# Patient Record
Sex: Female | Born: 1970 | Race: White | Hispanic: No | Smoking: Never smoker
Health system: Southern US, Community
[De-identification: ages and names within clinical notes are randomized; demographics above are authoritative.]

## PROBLEM LIST (undated history)

## (undated) DIAGNOSIS — R7303 Prediabetes: Secondary | ICD-10-CM

## (undated) DIAGNOSIS — I10 Essential (primary) hypertension: Secondary | ICD-10-CM

## (undated) DIAGNOSIS — E119 Type 2 diabetes mellitus without complications: Secondary | ICD-10-CM

## (undated) DIAGNOSIS — E039 Hypothyroidism, unspecified: Secondary | ICD-10-CM

## (undated) DIAGNOSIS — T783XXA Angioneurotic edema, initial encounter: Secondary | ICD-10-CM

## (undated) DIAGNOSIS — F329 Major depressive disorder, single episode, unspecified: Secondary | ICD-10-CM

## (undated) DIAGNOSIS — J45909 Unspecified asthma, uncomplicated: Secondary | ICD-10-CM

## (undated) DIAGNOSIS — M21371 Foot drop, right foot: Secondary | ICD-10-CM

## (undated) DIAGNOSIS — C55 Malignant neoplasm of uterus, part unspecified: Secondary | ICD-10-CM

## (undated) DIAGNOSIS — G4730 Sleep apnea, unspecified: Secondary | ICD-10-CM

## (undated) DIAGNOSIS — D649 Anemia, unspecified: Secondary | ICD-10-CM

## (undated) DIAGNOSIS — E079 Disorder of thyroid, unspecified: Secondary | ICD-10-CM

## (undated) DIAGNOSIS — L509 Urticaria, unspecified: Secondary | ICD-10-CM

## (undated) DIAGNOSIS — F32A Depression, unspecified: Secondary | ICD-10-CM

## (undated) DIAGNOSIS — F419 Anxiety disorder, unspecified: Secondary | ICD-10-CM

## (undated) DIAGNOSIS — M1990 Unspecified osteoarthritis, unspecified site: Secondary | ICD-10-CM

## (undated) HISTORY — PX: WISDOM TOOTH EXTRACTION: SHX21

## (undated) HISTORY — DX: Malignant neoplasm of uterus, part unspecified: C55

## (undated) HISTORY — PX: SINUS EXPLORATION: SHX5214

## (undated) HISTORY — PX: OTHER SURGICAL HISTORY: SHX169

## (undated) HISTORY — PX: SINOSCOPY: SHX187

## (undated) HISTORY — PX: CHOLECYSTECTOMY: SHX55

## (undated) HISTORY — PX: DILATION AND CURETTAGE OF UTERUS: SHX78

## (undated) HISTORY — DX: Urticaria, unspecified: L50.9

## (undated) HISTORY — DX: Unspecified osteoarthritis, unspecified site: M19.90

## (undated) HISTORY — PX: CARPAL TUNNEL RELEASE: SHX101

## (undated) HISTORY — PX: ABDOMINAL HYSTERECTOMY: SHX81

## (undated) HISTORY — DX: Essential (primary) hypertension: I10

## (undated) HISTORY — DX: Foot drop, right foot: M21.371

## (undated) HISTORY — DX: Angioneurotic edema, initial encounter: T78.3XXA

## (undated) MED FILL — Dexamethasone Sodium Phosphate Inj 100 MG/10ML: INTRAMUSCULAR | Qty: 1 | Status: AC

## (undated) MED FILL — Gemcitabine HCl Inj 1 GM/26.3ML (38 MG/ML) (Base Equiv): INTRAVENOUS | Qty: 44 | Status: AC

## (undated) MED FILL — Fosaprepitant Dimeglumine For IV Infusion 150 MG (Base Eq): INTRAVENOUS | Qty: 5 | Status: AC

---

## 2012-11-18 ENCOUNTER — Emergency Department (HOSPITAL_BASED_OUTPATIENT_CLINIC_OR_DEPARTMENT_OTHER)
Admission: EM | Admit: 2012-11-18 | Discharge: 2012-11-18 | Disposition: A | Discharge status: Home / Self Care | Payer: Managed Care, Other (non HMO) | Attending: Emergency Medicine | Admitting: Emergency Medicine

## 2012-11-18 ENCOUNTER — Encounter (HOSPITAL_BASED_OUTPATIENT_CLINIC_OR_DEPARTMENT_OTHER): Payer: Self-pay | Admitting: Emergency Medicine

## 2012-11-18 DIAGNOSIS — S0501XA Injury of conjunctiva and corneal abrasion without foreign body, right eye, initial encounter: Secondary | ICD-10-CM

## 2012-11-18 DIAGNOSIS — S058X9A Other injuries of unspecified eye and orbit, initial encounter: Secondary | ICD-10-CM | POA: Insufficient documentation

## 2012-11-18 DIAGNOSIS — E079 Disorder of thyroid, unspecified: Secondary | ICD-10-CM | POA: Insufficient documentation

## 2012-11-18 DIAGNOSIS — F329 Major depressive disorder, single episode, unspecified: Secondary | ICD-10-CM | POA: Insufficient documentation

## 2012-11-18 DIAGNOSIS — X58XXXA Exposure to other specified factors, initial encounter: Secondary | ICD-10-CM | POA: Insufficient documentation

## 2012-11-18 DIAGNOSIS — Y939 Activity, unspecified: Secondary | ICD-10-CM | POA: Insufficient documentation

## 2012-11-18 DIAGNOSIS — Z792 Long term (current) use of antibiotics: Secondary | ICD-10-CM | POA: Insufficient documentation

## 2012-11-18 DIAGNOSIS — Y929 Unspecified place or not applicable: Secondary | ICD-10-CM | POA: Insufficient documentation

## 2012-11-18 DIAGNOSIS — Z79899 Other long term (current) drug therapy: Secondary | ICD-10-CM | POA: Insufficient documentation

## 2012-11-18 DIAGNOSIS — F3289 Other specified depressive episodes: Secondary | ICD-10-CM | POA: Insufficient documentation

## 2012-11-18 HISTORY — DX: Disorder of thyroid, unspecified: E07.9

## 2012-11-18 HISTORY — DX: Depression, unspecified: F32.A

## 2012-11-18 HISTORY — DX: Major depressive disorder, single episode, unspecified: F32.9

## 2012-11-18 MED ORDER — TETRACAINE HCL 0.5 % OP SOLN
1.0000 [drp] | Freq: Once | OPHTHALMIC | Status: AC
  Administered 2012-11-18: 1 [drp] via OPHTHALMIC

## 2012-11-18 MED ORDER — FLUORESCEIN SODIUM 1 MG OP STRP
1.0000 | ORAL_STRIP | Freq: Once | OPHTHALMIC | Status: AC
  Administered 2012-11-18: 1 via OPHTHALMIC

## 2012-11-18 MED ORDER — FLUORESCEIN SODIUM 1 MG OP STRP
ORAL_STRIP | OPHTHALMIC | Status: AC
  Administered 2012-11-18: 1 via OPHTHALMIC
  Filled 2012-11-18: qty 1

## 2012-11-18 MED ORDER — TETRACAINE HCL 0.5 % OP SOLN
OPHTHALMIC | Status: AC
  Administered 2012-11-18: 1 [drp] via OPHTHALMIC
  Filled 2012-11-18: qty 2

## 2012-11-18 MED ORDER — ERYTHROMYCIN 5 MG/GM OP OINT
TOPICAL_OINTMENT | Freq: Four times a day (QID) | OPHTHALMIC | Status: DC
  Administered 2012-11-18: 1 via OPHTHALMIC
  Filled 2012-11-18: qty 3.5

## 2012-11-18 MED ORDER — HYDROCODONE-ACETAMINOPHEN 5-325 MG PO TABS
1.0000 | ORAL_TABLET | Freq: Once | ORAL | Status: AC
  Administered 2012-11-18: 1 via ORAL
  Filled 2012-11-18: qty 1

## 2012-11-18 MED ORDER — HYDROCODONE-ACETAMINOPHEN 5-325 MG PO TABS: 1.0000 | ORAL_TABLET | Freq: Four times a day (QID) | ORAL | Status: DC | PRN

## 2012-11-18 NOTE — ED Notes (Signed)
Pt states she developed shooting pain to right eye around 2200 last pm, went to bed, throughtout night pain got worse,

## 2012-11-18 NOTE — ED Provider Notes (Signed)
CSN: [id number]     Arrival date & time 11/18/12  7829 History   First MD Initiated Contact with Patient 11/18/12 [phone]     Chief Complaint  Patient presents with  . Eye Pain   (Consider location/radiation/quality/duration/timing/severity/associated sxs/prior Treatment) HPI This is a 42 year old female who wears extended wear contact lenses. She developed some pain in her right eye yesterday evening about 10 PM. She is not aware of any trauma to eye. Throughout the night the pain got worse she took her right contact lens out about an hour and a half ago. She was not aware of any blurred vision prior to removing her contact. She rates her pain about a 7/10. There is associated photophobia and watering of the right eye.  Past Medical History  Diagnosis Date  . Thyroid disease   . Depression    History reviewed. No pertinent past surgical history. History reviewed. No pertinent family history. History  Substance Use Topics  . Smoking status: Never Smoker   . Smokeless tobacco: Not on file  . Alcohol Use: No   OB History   Grav Para Term Preterm Abortions TAB SAB Ect Mult Living                 Review of Systems  All other systems reviewed and are negative.    Allergies  Ciprofloxacin  Home Medications   Current Outpatient Rx  Name  Route  Sig  Dispense  Refill  . citalopram (CELEXA) 20 MG tablet   Oral   Take 20 mg by mouth daily.         Marland Kitchen levothyroxine (SYNTHROID, LEVOTHROID) 100 MCG tablet   Oral   Take 100 mcg by mouth daily before breakfast.          BP 150/73  Pulse 68  Temp(Src) 97.7 F (36.5 C) (Oral)  Resp 20  SpO2 98%  LMP 10/31/2012  Physical Exam General: Well-developed, morbidly obese female in no acute distress; appearance consistent with age of record HENT: normocephalic; atraumatic Eyes: pupils equal, round and reactive to light; extraocular muscles intact; small abrasion seen near edge of right cornea with fluorescein uptake (pain  completely relieved with tetracaine drops) Neck: supple Heart: regular rate and rhythm Lungs: Normal respiratory effort and excursion Abdomen: soft; nondistended Extremities: No deformity; full range of motion Neurologic: Awake, alert and oriented; motor function intact in all extremities and symmetric; no facial droop Skin: Warm and dry Psychiatric: Normal mood and affect    ED Course  Procedures (including critical care time)  MDM  Patient was advised not to wear her right contact for at least 3 days or 24 hours after resolution of her symptoms. If her symptoms persist longer than 48 hours she should follow up with her eye doctor.    Hanley Seamen, MD 11/18/12 [phone]

## 2013-03-23 ENCOUNTER — Emergency Department (HOSPITAL_BASED_OUTPATIENT_CLINIC_OR_DEPARTMENT_OTHER)
Admission: EM | Admit: 2013-03-23 | Discharge: 2013-03-23 | Disposition: A | Discharge status: Home / Self Care | Payer: Managed Care, Other (non HMO) | Attending: Emergency Medicine | Admitting: Emergency Medicine

## 2013-03-23 ENCOUNTER — Encounter (HOSPITAL_BASED_OUTPATIENT_CLINIC_OR_DEPARTMENT_OTHER): Payer: Self-pay | Admitting: Emergency Medicine

## 2013-03-23 ENCOUNTER — Emergency Department (HOSPITAL_BASED_OUTPATIENT_CLINIC_OR_DEPARTMENT_OTHER): Payer: Managed Care, Other (non HMO)

## 2013-03-23 DIAGNOSIS — E079 Disorder of thyroid, unspecified: Secondary | ICD-10-CM | POA: Insufficient documentation

## 2013-03-23 DIAGNOSIS — J45901 Unspecified asthma with (acute) exacerbation: Secondary | ICD-10-CM | POA: Insufficient documentation

## 2013-03-23 DIAGNOSIS — J40 Bronchitis, not specified as acute or chronic: Secondary | ICD-10-CM

## 2013-03-23 DIAGNOSIS — R509 Fever, unspecified: Secondary | ICD-10-CM | POA: Insufficient documentation

## 2013-03-23 DIAGNOSIS — F329 Major depressive disorder, single episode, unspecified: Secondary | ICD-10-CM | POA: Insufficient documentation

## 2013-03-23 DIAGNOSIS — Z79899 Other long term (current) drug therapy: Secondary | ICD-10-CM | POA: Insufficient documentation

## 2013-03-23 DIAGNOSIS — F3289 Other specified depressive episodes: Secondary | ICD-10-CM | POA: Insufficient documentation

## 2013-03-23 HISTORY — DX: Unspecified asthma, uncomplicated: J45.909

## 2013-03-23 MED ORDER — HYDROCODONE-HOMATROPINE 5-1.5 MG/5ML PO SYRP: 5.0000 mL | ORAL_SOLUTION | Freq: Four times a day (QID) | ORAL | Status: DC | PRN

## 2013-03-23 MED ORDER — ALBUTEROL SULFATE (2.5 MG/3ML) 0.083% IN NEBU
2.5000 mg | INHALATION_SOLUTION | Freq: Once | RESPIRATORY_TRACT | Status: AC
  Administered 2013-03-23: 2.5 mg via RESPIRATORY_TRACT

## 2013-03-23 MED ORDER — IPRATROPIUM-ALBUTEROL 0.5-2.5 (3) MG/3ML IN SOLN
RESPIRATORY_TRACT | Status: AC
  Filled 2013-03-23: qty 3

## 2013-03-23 MED ORDER — PREDNISONE 20 MG PO TABS: 40.0000 mg | ORAL_TABLET | Freq: Every day | ORAL | Status: DC

## 2013-03-23 MED ORDER — PREDNISONE 10 MG PO TABS
60.0000 mg | ORAL_TABLET | Freq: Once | ORAL | Status: AC
  Administered 2013-03-23: 60 mg via ORAL
  Filled 2013-03-23 (×2): qty 1

## 2013-03-23 MED ORDER — IPRATROPIUM-ALBUTEROL 0.5-2.5 (3) MG/3ML IN SOLN
3.0000 mL | Freq: Once | RESPIRATORY_TRACT | Status: AC
  Administered 2013-03-23: 3 mL via RESPIRATORY_TRACT
  Filled 2013-03-23: qty 3

## 2013-03-23 MED ORDER — IPRATROPIUM BROMIDE 0.02 % IN SOLN
0.5000 mg | Freq: Once | RESPIRATORY_TRACT | Status: AC
  Administered 2013-03-23: 0.5 mg via RESPIRATORY_TRACT
  Filled 2013-03-23: qty 2.5

## 2013-03-23 MED ORDER — ALBUTEROL SULFATE (2.5 MG/3ML) 0.083% IN NEBU
INHALATION_SOLUTION | RESPIRATORY_TRACT | Status: AC
  Filled 2013-03-23: qty 3

## 2013-03-23 MED ORDER — ALBUTEROL SULFATE (2.5 MG/3ML) 0.083% IN NEBU
5.0000 mg | INHALATION_SOLUTION | Freq: Once | RESPIRATORY_TRACT | Status: AC
  Administered 2013-03-23: 5 mg via RESPIRATORY_TRACT
  Filled 2013-03-23: qty 6

## 2013-03-23 NOTE — ED Notes (Signed)
Pt c/o SOb x 1 week with cough

## 2013-03-23 NOTE — Discharge Instructions (Signed)
Bronchitis °Bronchitis is swelling (inflammation) of the air tubes leading to your lungs (bronchi). This causes mucus and a cough. If the swelling gets bad, you may have trouble breathing. °HOME CARE  °· Rest. °· Drink enough fluids to keep your pee (urine) clear or pale yellow (unless you have a condition where you have to watch how much you drink). °· Only take medicine as told by your doctor. If you were given antibiotic medicines, finish them even if you start to feel better. °· Avoid smoke, irritating chemicals, and strong smells. These make the problem worse. Quit smoking if you smoke. This helps your lungs heal faster. °· Use a cool mist humidifier. Change the water in the humidifier every day. You can also sit in the bathroom with hot shower running for 5 10 minutes. Keep the door closed. °· See your health care provider as told. °· Wash your hands often. °GET HELP IF: °Your problems do not get better after 1 week. °GET HELP RIGHT AWAY IF:  °· Your fever gets worse. °· You have chills. °· Your chest hurts. °· Your problems breathing get worse. °· You have blood in your mucus. °· You pass out (faint). °· You feel lightheaded. °· You have a bad headache. °· You throw up (vomit) again and again. °MAKE SURE YOU: °· Understand these instructions. °· Will watch your condition. °· Will get help right away if you are not doing well or get worse. °Document Released: 07/17/2007 Document Revised: 11/18/2012 Document Reviewed: 09/22/2012 °ExitCare® Patient Information ©2014 [employer]. ° °

## 2013-03-23 NOTE — ED Notes (Signed)
C/o cough, congestion sob x 1 week  Denies pain

## 2013-03-23 NOTE — ED Provider Notes (Addendum)
CSN: [id number]     Arrival date & time 03/23/13  1919 History  This chart was scribed for Blanchie Dessert, MD by Celesta Gentile, ED Scribe. The patient was seen in room MH08/MH08. Patient's care was started at 7:49 PM.  Chief Complaint  Patient presents with  . Shortness of Breath   Patient is a 43 y.o. female presenting with shortness of breath. The history is provided by the patient. No language interpreter was used.  Shortness of Breath Severity:  Moderate Onset quality:  Gradual Duration:  7 days Timing:  Intermittent Progression:  Worsening Chronicity:  New Context: activity   Context: not smoke exposure   Context comment:  Bronchitis Relieved by:  Nothing Worsened by:  Coughing and movement Ineffective treatments:  Inhaler Associated symptoms: fever and wheezing   Associated symptoms: no abdominal pain, no chest pain, no cough, no headaches, no rash and no vomiting   Risk factors: obesity    HPI Comments: Janet [name] is a 43 y.o. female who presents to the Emergency Department complaining of moderate intermittent SOB with an associated dry cough that started about 7 days ago.  She reports her chest feels tight, especially with movement.  Pt states that she has had intermittent fever and chills.  Pt states that she was diagnosed with bronchitis in January and prescribed azithromycin and prednisone.  Pt reports that she finished her medications resolution of sx for a few weeks and then this started again.  Pt states that she has a rescue inhaler.  She reports that she never had to use it, but now she has to use it multiple times throughout the day.  Pt denies smoking or being around smoke.    Past Medical History  Diagnosis Date  . Thyroid disease   . Depression   . Asthma    Past Surgical History  Procedure Laterality Date  . Carpal tunnel release    . Cesarean section    . Sinus exploration    . Dilation and curettage of uterus     History reviewed. No pertinent  family history. History  Substance Use Topics  . Smoking status: Never Smoker   . Smokeless tobacco: Not on file  . Alcohol Use: No   OB History   Grav Para Term Preterm Abortions TAB SAB Ect Mult Living                 Review of Systems  Constitutional: Positive for fever and chills.  HENT: Negative for congestion and rhinorrhea.   Respiratory: Positive for shortness of breath and wheezing. Negative for cough.   Cardiovascular: Negative for chest pain.  Gastrointestinal: Negative for nausea, vomiting, abdominal pain and diarrhea.  Musculoskeletal: Negative for back pain.  Skin: Negative for color change and rash.  Neurological: Negative for syncope and headaches.  Psychiatric/Behavioral: Negative for behavioral problems and confusion.  All other systems reviewed and are negative.   Allergies  Ciprofloxacin  Home Medications   Current Outpatient Rx  Name  Route  Sig  Dispense  Refill  . citalopram (CELEXA) 20 MG tablet   Oral   Take 20 mg by mouth daily.         Marland Kitchen HYDROcodone-acetaminophen (NORCO/VICODIN) 5-325 MG per tablet   Oral   Take 1-2 tablets by mouth every 6 (six) hours as needed for pain.   20 tablet   0   . levothyroxine (SYNTHROID, LEVOTHROID) 100 MCG tablet   Oral   Take 100 mcg by mouth daily before  breakfast.          Triage Vitals: BP 165/69  Pulse 92  Temp(Src) 98.9 F (37.2 C) (Oral)  Ht 5\' 9"  (1.753 m)  Wt 385 lb (174.635 kg)  BMI 56.83 kg/m2  SpO2 100%  Physical Exam  Nursing note and vitals reviewed. Constitutional: She is oriented to person, place, and time. She appears well-developed and well-nourished. No distress.  HENT:  Head: Normocephalic and atraumatic.  Right Ear: External ear normal.  Left Ear: External ear normal.  Mouth/Throat: Oropharynx is clear and moist. No oropharyngeal exudate.  Swollen nasal turbinates.    Eyes: Conjunctivae and EOM are normal. Right eye exhibits no discharge. Left eye exhibits no discharge.   Neck: Neck supple. No tracheal deviation present.  Cardiovascular: Normal rate, regular rhythm and normal heart sounds.  Exam reveals no gallop and no friction rub.   No murmur heard. Pulmonary/Chest: Effort normal. No respiratory distress. She has wheezes (diffuse).  Abdominal:  Morbidly obese.    Musculoskeletal: Normal range of motion.  Neurological: She is alert and oriented to person, place, and time.  Skin: Skin is warm and dry. No rash noted.  Psychiatric: She has a normal mood and affect. Her behavior is normal. Judgment and thought content normal.    ED Course  Procedures (including critical care time) DIAGNOSTIC STUDIES: Oxygen Saturation is 100% on RA, normal by my interpretation.    COORDINATION OF CARE: 7:50PM-Will order breathing treatments. Patient informed of current plan of treatment and evaluation and agrees with plan.    Labs Review Labs Reviewed - No data to display Imaging Review Dg Chest 2 View  03/23/2013   CLINICAL DATA:  Cough, congestion and shortness of breath  EXAM: CHEST  2 VIEW  COMPARISON:  Prior radiograph from 03/17/2007  FINDINGS: The cardiac and mediastinal silhouettes are stable in size and contour, and remain within normal limits.  The lungs are normally inflated. Diffuse bronchitic changes are present. No airspace consolidation, pleural effusion, or pulmonary edema is identified. There is no pneumothorax.  No acute osseous abnormality identified.  IMPRESSION: Diffuse bronchitic changes. No focal infiltrate identified to suggest pneumonia.   Electronically Signed   By: Jeannine Boga M.D.   On: 03/23/2013 20:48    EKG Interpretation   None       MDM   Final diagnoses:  Bronchitis   Pt with symptoms consistent with viral bronchitis with diffuse wheezing but sating normally and no resp distress.  Well appearing here.  Low suspicion for flu. No signs of pharyngitis, otitis or abnormal abdominal findings.   CXR pending.  Pt given  albuterol/atrovent and prednisone.  8:51 PM CXR showed diffuse bronchitis changes but o/w no focal PNA.  After neb and steroids wheezing improved but continues to wheeze and given second treatment.  9:12 PM Wheezing greatly improved.  Will send pt home with steroids and cough meds.  Already has albuterol at home.  I personally performed the services described in this documentation, which was scribed in my presence.  The recorded information has been reviewed and considered.    Blanchie Dessert, MD 03/23/13 2113  Blanchie Dessert, MD 03/23/13 2114

## 2013-04-13 ENCOUNTER — Ambulatory Visit: Payer: Managed Care, Other (non HMO)

## 2013-06-23 ENCOUNTER — Encounter (HOSPITAL_BASED_OUTPATIENT_CLINIC_OR_DEPARTMENT_OTHER): Payer: Self-pay | Admitting: Emergency Medicine

## 2013-06-23 ENCOUNTER — Emergency Department (HOSPITAL_BASED_OUTPATIENT_CLINIC_OR_DEPARTMENT_OTHER)
Admission: EM | Admit: 2013-06-23 | Discharge: 2013-06-23 | Disposition: A | Discharge status: Home / Self Care | Payer: Managed Care, Other (non HMO) | Attending: Emergency Medicine | Admitting: Emergency Medicine

## 2013-06-23 DIAGNOSIS — J45901 Unspecified asthma with (acute) exacerbation: Secondary | ICD-10-CM | POA: Insufficient documentation

## 2013-06-23 DIAGNOSIS — E079 Disorder of thyroid, unspecified: Secondary | ICD-10-CM | POA: Insufficient documentation

## 2013-06-23 DIAGNOSIS — Z79899 Other long term (current) drug therapy: Secondary | ICD-10-CM | POA: Insufficient documentation

## 2013-06-23 DIAGNOSIS — M62838 Other muscle spasm: Secondary | ICD-10-CM | POA: Insufficient documentation

## 2013-06-23 DIAGNOSIS — F329 Major depressive disorder, single episode, unspecified: Secondary | ICD-10-CM | POA: Insufficient documentation

## 2013-06-23 DIAGNOSIS — F3289 Other specified depressive episodes: Secondary | ICD-10-CM | POA: Insufficient documentation

## 2013-06-23 MED ORDER — ALBUTEROL SULFATE (2.5 MG/3ML) 0.083% IN NEBU
5.0000 mg | INHALATION_SOLUTION | Freq: Once | RESPIRATORY_TRACT | Status: AC
  Administered 2013-06-23: 5 mg via RESPIRATORY_TRACT
  Filled 2013-06-23: qty 6

## 2013-06-23 MED ORDER — IBUPROFEN 800 MG PO TABS: 800.0000 mg | ORAL_TABLET | Freq: Three times a day (TID) | ORAL | Status: DC | PRN

## 2013-06-23 MED ORDER — CYCLOBENZAPRINE HCL 10 MG PO TABS: 10.0000 mg | ORAL_TABLET | Freq: Three times a day (TID) | ORAL | Status: DC | PRN

## 2013-06-23 NOTE — ED Provider Notes (Signed)
CSN: [id number]     Arrival date & time 06/23/13  1953 History  This chart was scribed for Hason Ofarrell B. Karle Starch, MD by Zettie Pho, ED Scribe. This patient was seen in room MH05/MH05 and the patient's care was started at 9:08 PM.    Chief Complaint  Patient presents with  . Shoulder Pain   The history is provided by the patient. No language interpreter was used.   HPI Comments: Janet [name] is a 43 y.o. female who presents to the Emergency Department complaining of a waxing and waning, gradual onset pain to the left shoulder that radiates to the left side of the neck onset about 1.5 weeks ago after carrying heavy objects over the shoulder. She states that the pain is exacerbated with certain movements. Patient reports taking ibuprofen and applying Icy Hot to the area at home without significant relief. Patient has a history of asthma and thyroid disease.   Patient also presents to the ED with wheezing, which she states has been gradually improving. Patient reports that she has a rescue inhaler at home, but has not used it because it has almost run out.   Past Medical History  Diagnosis Date  . Thyroid disease   . Depression   . Asthma    Past Surgical History  Procedure Laterality Date  . Carpal tunnel release    . Cesarean section    . Sinus exploration    . Dilation and curettage of uterus     History reviewed. No pertinent family history. History  Substance Use Topics  . Smoking status: Never Smoker   . Smokeless tobacco: Not on file  . Alcohol Use: No   OB History   Grav Para Term Preterm Abortions TAB SAB Ect Mult Living                 Review of Systems  A complete 10 system review of systems was obtained and all systems are negative except as noted in the HPI and PMH.    Allergies  Ciprofloxacin  Home Medications   Prior to Admission medications   Medication Sig Start Date End Date Taking? Authorizing Provider  citalopram (CELEXA) 20 MG tablet Take 20 mg by  mouth daily.    Historical Provider, MD  levothyroxine (SYNTHROID, LEVOTHROID) 100 MCG tablet Take 100 mcg by mouth daily before breakfast.    Historical Provider, MD   Triage Vitals: BP 165/83  Pulse 73  Temp(Src) 98.4 F (36.9 C) (Oral)  Resp 14  Ht 5\' 9"  (1.753 m)  Wt 370 lb (167.831 kg)  BMI 54.61 kg/m2  SpO2 94%  Physical Exam  Nursing note and vitals reviewed. Constitutional: She is oriented to person, place, and time. She appears well-developed and well-nourished.  HENT:  Head: Normocephalic and atraumatic.  Eyes: EOM are normal. Pupils are equal, round, and reactive to light.  Neck: Normal range of motion. Neck supple.  Tenderness in the muscles of the left neck and shoulder.  Cardiovascular: Normal rate, normal heart sounds and intact distal pulses.   Pulmonary/Chest: Effort normal. She has wheezes.  Faint end expiratory wheezes.   Abdominal: Bowel sounds are normal. She exhibits no distension. There is no tenderness.  Musculoskeletal: Normal range of motion. She exhibits no edema and no tenderness.  Neurological: She is alert and oriented to person, place, and time. She has normal strength. No cranial nerve deficit or sensory deficit.  Skin: Skin is warm and dry. No rash noted.  Psychiatric: She has a  normal mood and affect.    ED Course  Procedures (including critical care time)  DIAGNOSTIC STUDIES: Oxygen Saturation is 94% on room air, normal by my interpretation.    COORDINATION OF CARE: 9:11 PM- Discussed that symptoms are likely muscular in nature. Will discharge patient with ibuprofen and Flexeril to manage symptoms. Also ordered albuterol nebulizer in the ED to help with her breathing. Discussed treatment plan with patient at bedside and patient verbalized agreement.     Labs Review Labs Reviewed - No data to display  Imaging Review No results found.   EKG Interpretation None      MDM   Final diagnoses:  Muscle spasm of left shoulder area     I personally performed the services described in this documentation, which was scribed in my presence. The recorded information has been reviewed and is accurate.      Catalaya Garr B. Karle Starch, MD 06/24/13 1343

## 2013-06-23 NOTE — ED Notes (Signed)
Left neck and left shoulder pain x 1 weeks. Tender to touch, full shoulder ROM. Carried a case of water 2 weeks ago; believe this is the contributing factor. Pt denies CP but admit to SOB. SOB occurs daily, asthma poorly controlled, use albuterol inhaler daily. No PCP.  RT in room.

## 2013-06-23 NOTE — Progress Notes (Signed)
Patient states she is breathing much better post nebulizer treatment.

## 2013-06-23 NOTE — ED Notes (Signed)
Pt c/o left shoulder and neck pain after carrying something heavy x 1 week ago

## 2013-06-23 NOTE — Discharge Instructions (Signed)
°  Muscle Cramps and Spasms °Muscle cramps and spasms occur when a muscle or muscles tighten and you have no control over this tightening (involuntary muscle contraction). They are a common problem and can develop in any muscle. The most common place is in the calf muscles of the leg. Both muscle cramps and muscle spasms are involuntary muscle contractions, but they also have differences:  °· Muscle cramps are sporadic and painful. They may last a few seconds to a quarter of an hour. Muscle cramps are often more forceful and last longer than muscle spasms. °· Muscle spasms may or may not be painful. They may also last just a few seconds or much longer. °CAUSES  °It is uncommon for cramps or spasms to be due to a serious underlying problem. In many cases, the cause of cramps or spasms is unknown. Some common causes are:  °· Overexertion.   °· Overuse from repetitive motions (doing the same thing over and over).   °· Remaining in a certain position for a long period of time.   °· Improper preparation, form, or technique while performing a sport or activity.   °· Dehydration.   °· Injury.   °· Side effects of some medicines.   °· Abnormally low levels of the salts and ions in your blood (electrolytes), especially potassium and calcium. This could happen if you are taking water pills (diuretics) or you are pregnant.   °Some underlying medical problems can make it more likely to develop cramps or spasms. These include, but are not limited to:  °· Diabetes.   °· Parkinson disease.   °· Hormone disorders, such as thyroid problems.   °· Alcohol abuse.   °· Diseases specific to muscles, joints, and bones.   °· Blood vessel disease where not enough blood is getting to the muscles.   °HOME CARE INSTRUCTIONS  °· Stay well hydrated. Drink enough water and fluids to keep your urine clear or pale yellow. °· It may be helpful to massage, stretch, and relax the affected muscle. °· For tight or tense muscles, use a warm towel, heating  pad, or hot shower water directed to the affected area. °· If you are sore or have pain after a cramp or spasm, applying ice to the affected area may relieve discomfort. °· Put ice in a plastic bag. °· Place a towel between your skin and the bag. °· Leave the ice on for 15-20 minutes, 03-04 times a day. °· Medicines used to treat a known cause of cramps or spasms may help reduce their frequency or severity. Only take over-the-counter or prescription medicines as directed by your caregiver. °SEEK MEDICAL CARE IF:  °Your cramps or spasms get more severe, more frequent, or do not improve over time.  °MAKE SURE YOU:  °· Understand these instructions. °· Will watch your condition. °· Will get help right away if you are not doing well or get worse. °Document Released: 07/20/2001 Document Revised: 05/25/2012 Document Reviewed: 01/15/2012 °Exit[employer]® Patient Information ©2014 [employer]. ° ° °

## 2013-09-05 ENCOUNTER — Encounter (HOSPITAL_BASED_OUTPATIENT_CLINIC_OR_DEPARTMENT_OTHER): Payer: Self-pay | Admitting: Emergency Medicine

## 2013-09-05 ENCOUNTER — Emergency Department (HOSPITAL_BASED_OUTPATIENT_CLINIC_OR_DEPARTMENT_OTHER)
Admission: EM | Admit: 2013-09-05 | Discharge: 2013-09-05 | Disposition: A | Discharge status: Home / Self Care | Payer: Managed Care, Other (non HMO) | Attending: Emergency Medicine | Admitting: Emergency Medicine

## 2013-09-05 DIAGNOSIS — F3289 Other specified depressive episodes: Secondary | ICD-10-CM | POA: Insufficient documentation

## 2013-09-05 DIAGNOSIS — J45901 Unspecified asthma with (acute) exacerbation: Secondary | ICD-10-CM | POA: Insufficient documentation

## 2013-09-05 DIAGNOSIS — F329 Major depressive disorder, single episode, unspecified: Secondary | ICD-10-CM | POA: Insufficient documentation

## 2013-09-05 DIAGNOSIS — E079 Disorder of thyroid, unspecified: Secondary | ICD-10-CM | POA: Insufficient documentation

## 2013-09-05 DIAGNOSIS — IMO0002 Reserved for concepts with insufficient information to code with codable children: Secondary | ICD-10-CM | POA: Insufficient documentation

## 2013-09-05 DIAGNOSIS — R062 Wheezing: Secondary | ICD-10-CM | POA: Insufficient documentation

## 2013-09-05 MED ORDER — PREDNISONE 50 MG PO TABS
60.0000 mg | ORAL_TABLET | Freq: Once | ORAL | Status: AC
  Administered 2013-09-05: 60 mg via ORAL
  Filled 2013-09-05 (×2): qty 1

## 2013-09-05 MED ORDER — PREDNISONE 20 MG PO TABS: 60.0000 mg | ORAL_TABLET | Freq: Every day | ORAL | Status: DC

## 2013-09-05 MED ORDER — ALBUTEROL SULFATE HFA 108 (90 BASE) MCG/ACT IN AERS
1.0000 | INHALATION_SPRAY | RESPIRATORY_TRACT | Status: DC | PRN
  Administered 2013-09-05: 2 via RESPIRATORY_TRACT
  Filled 2013-09-05: qty 6.7

## 2013-09-05 MED ORDER — LEVOTHYROXINE SODIUM 100 MCG PO TABS: 100.0000 ug | ORAL_TABLET | Freq: Every day | ORAL | Status: DC

## 2013-09-05 MED ORDER — PREDNISONE 20 MG PO TABS: 60.0000 mg | ORAL_TABLET | Freq: Once | ORAL | Status: DC

## 2013-09-05 MED ORDER — ALBUTEROL SULFATE (2.5 MG/3ML) 0.083% IN NEBU
5.0000 mg | INHALATION_SOLUTION | Freq: Once | RESPIRATORY_TRACT | Status: AC
  Administered 2013-09-05: 5 mg via RESPIRATORY_TRACT
  Filled 2013-09-05: qty 6

## 2013-09-05 MED ORDER — IBUPROFEN 800 MG PO TABS: 800.0000 mg | ORAL_TABLET | Freq: Three times a day (TID) | ORAL | Status: DC | PRN

## 2013-09-05 MED ORDER — CITALOPRAM HYDROBROMIDE 20 MG PO TABS: 20.0000 mg | ORAL_TABLET | Freq: Every day | ORAL | Status: DC

## 2013-09-05 NOTE — ED Provider Notes (Signed)
CSN: [id number]     Arrival date & time 09/05/13  1243 History   First MD Initiated Contact with Patient 09/05/13 1308     Chief Complaint  Patient presents with  . Wheezing    HPI Patient presents to the emergency room with complaints of wheezing and shortness of breath. The patient has a history of asthma. She has run out of her inhalers. Over the last week and a half she's noticed increasing wheezing, cough and shortness of breath with exertion. She's not had any fevers. She denies any chest pain. The cough has been nonproductive.  Patient also has had trouble with left shoulder pain for a long period of time. It seems to be increasing in intensity. She notices pain primarily when she tries to push something with her arms. Her husband is in a wheelchair and she, has to push him. She has not had any falls. She denies any numbness or weakness. The pain is aching in the left trapezius and shoulder. Patient also requests refills of her medications. She does not have a primary doctor.   Past Medical History  Diagnosis Date  . Thyroid disease   . Depression   . Asthma    Past Surgical History  Procedure Laterality Date  . Carpal tunnel release    . Cesarean section    . Sinus exploration    . Dilation and curettage of uterus     No family history on file. History  Substance Use Topics  . Smoking status: Never Smoker   . Smokeless tobacco: Not on file  . Alcohol Use: No   OB History   Grav Para Term Preterm Abortions TAB SAB Ect Mult Living                 Review of Systems  All other systems reviewed and are negative.     Allergies  Ciprofloxacin  Home Medications   Prior to Admission medications   Medication Sig Start Date End Date Taking? Authorizing Provider  citalopram (CELEXA) 20 MG tablet Take 1 tablet (20 mg total) by mouth daily. 09/05/13   Dorie Rank, MD  cyclobenzaprine (FLEXERIL) 10 MG tablet Take 1 tablet (10 mg total) by mouth 3 (three) times daily as  needed for muscle spasms. 06/23/13   Charles B. Karle Starch, MD  ibuprofen (ADVIL,MOTRIN) 800 MG tablet Take 1 tablet (800 mg total) by mouth every 8 (eight) hours as needed. 09/05/13   Dorie Rank, MD  levothyroxine (SYNTHROID, LEVOTHROID) 100 MCG tablet Take 1 tablet (100 mcg total) by mouth daily before breakfast. 09/05/13   Dorie Rank, MD  predniSONE (DELTASONE) 20 MG tablet Take 3 tablets (60 mg total) by mouth once. 09/05/13   Dorie Rank, MD   BP 170/76  Pulse 81  Temp(Src) 97.7 F (36.5 C) (Oral)  Resp 24  Ht 5\' 9"  (1.753 m)  Wt 375 lb (170.099 kg)  BMI 55.35 kg/m2  SpO2 97% Physical Exam  Nursing note and vitals reviewed. Constitutional: She appears well-developed and well-nourished. No distress.  Morbidly obese  HENT:  Head: Normocephalic and atraumatic.  Right Ear: External ear normal.  Left Ear: External ear normal.  Eyes: Conjunctivae are normal. Right eye exhibits no discharge. Left eye exhibits no discharge. No scleral icterus.  Neck: Neck supple. No tracheal deviation present.  Cardiovascular: Normal rate, regular rhythm and intact distal pulses.   Pulmonary/Chest: Effort normal. No accessory muscle usage or stridor. Not tachypneic. No respiratory distress. She has wheezes. She has no  rales.  Wheezes on end expiration  Abdominal: Soft. Bowel sounds are normal. She exhibits no distension. There is no tenderness. There is no rebound and no guarding.  Musculoskeletal: She exhibits no edema and no tenderness.  Mild tenderness palpation left trapezius region, full range of motion the shoulder, no erythema, no bony tenderness palpation  Neurological: She is alert. She has normal strength. No cranial nerve deficit (no facial droop, extraocular movements intact, no slurred speech) or sensory deficit. She exhibits normal muscle tone. She displays no seizure activity. Coordination normal.  Skin: Skin is warm and dry. No rash noted.  Psychiatric: She has a normal mood and affect.    ED  Course  Procedures (including critical care time) Medications  albuterol (PROVENTIL HFA;VENTOLIN HFA) 108 (90 BASE) MCG/ACT inhaler 1-2 puff (not administered)  predniSONE (DELTASONE) tablet 60 mg (60 mg Oral Given 09/05/13 1359)  albuterol (PROVENTIL) (2.5 MG/3ML) 0.083% nebulizer solution 5 mg (5 mg Nebulization Given 09/05/13 1359)   1450  Pt is feeling better after treatment.  Occasional wheeze on end expiration but much improved.  MDM   Final diagnoses:  Asthma, unspecified asthma severity, with acute exacerbation    Patient does have bronchospasm on exam. I will give her an albuterol treatment and prednisone. Her shoulder pain may be related to rotator cuff tendinitis. Recommend NSAIDs and rest.  I will refill her Synthroid and Celexa. 1 month supply. I encouraged followup with primary care Dr.      Dorie Rank, MD 09/05/13 1451

## 2013-09-05 NOTE — ED Notes (Signed)
Pt c/o wheezing and SOB with exertion that began 1 to 1.5 weeks ago.

## 2013-09-05 NOTE — Discharge Instructions (Signed)
Asthma, Acute Bronchospasm °Acute bronchospasm caused by asthma is also referred to as an asthma attack. Bronchospasm means your air passages become narrowed. The narrowing is caused by inflammation and tightening of the muscles in the air tubes (bronchi) in your lungs. This can make it hard to breathe or cause you to wheeze and cough. °CAUSES °Possible triggers are: °· Animal dander from the skin, hair, or feathers of animals. °· Dust mites contained in house dust. °· Cockroaches. °· Pollen from trees or grass. °· Mold. °· Cigarette or tobacco smoke. °· Air pollutants such as dust, household cleaners, hair sprays, aerosol sprays, paint fumes, strong chemicals, or strong odors. °· Cold air or weather changes. Cold air may trigger inflammation. Winds increase molds and pollens in the air. °· Strong emotions such as crying or laughing hard. °· Stress. °· Certain medicines such as aspirin or beta-blockers. °· Sulfites in foods and drinks, such as dried fruits and wine. °· Infections or inflammatory conditions, such as a flu, cold, or inflammation of the nasal membranes (rhinitis). °· Gastroesophageal reflux disease (GERD). GERD is a condition where stomach acid backs up into your esophagus. °· Exercise or strenuous activity. °SIGNS AND SYMPTOMS  °· Wheezing. °· Excessive coughing, particularly at night. °· Chest tightness. °· Shortness of breath. °DIAGNOSIS  °Your health care provider will ask you about your medical history and perform a physical exam. A chest X-ray or blood testing may be performed to look for other causes of your symptoms or other conditions that may have triggered your asthma attack.  °TREATMENT  °Treatment is aimed at reducing inflammation and opening up the airways in your lungs.  Most asthma attacks are treated with inhaled medicines. These include quick relief or rescue medicines (such as bronchodilators) and controller medicines (such as inhaled corticosteroids). These medicines are sometimes  given through an inhaler or a nebulizer. Systemic steroid medicine taken by mouth or given through an IV tube also can be used to reduce the inflammation when an attack is moderate or severe. Antibiotic medicines are only used if a bacterial infection is present.  °HOME CARE INSTRUCTIONS  °· Rest. °· Drink plenty of liquids. This helps the mucus to remain thin and be easily coughed up. Only use caffeine in moderation and do not use alcohol until you have recovered from your illness. °· Do not smoke. Avoid being exposed to secondhand smoke. °· You play a critical role in keeping yourself in good health. Avoid exposure to things that cause you to wheeze or to have breathing problems. °· Keep your medicines up-to-date and available. Carefully follow your health care provider's treatment plan. °· Take your medicine exactly as prescribed. °· When pollen or pollution is bad, keep windows closed and use an air conditioner or go to places with air conditioning. °· Asthma requires careful medical care. See your health care provider for a follow-up as advised. If you are more than [redacted] weeks pregnant and you were prescribed any new medicines, let your obstetrician know about the visit and how you are doing. Follow up with your health care provider as directed. °· After you have recovered from your asthma attack, make an appointment with your outpatient doctor to talk about ways to reduce the likelihood of future attacks. If you do not have a doctor who manages your asthma, make an appointment with a primary care doctor to discuss your asthma. °SEEK IMMEDIATE MEDICAL CARE IF:  °· You are getting worse. °· You have trouble breathing. If severe, call your local   emergency services (911 in the [place] °· You develop chest pain or discomfort. °· You are vomiting. °· You are not able to keep fluids down. °· You are coughing up yellow, green, brown, or bloody sputum. °· You have a fever and your symptoms suddenly get worse. °· You have  trouble swallowing. °MAKE SURE YOU:  °· Understand these instructions. °· Will watch your condition. °· Will get help right away if you are not doing well or get worse. °Document Released: 05/15/2006 Document Revised: 02/02/2013 Document Reviewed: 08/05/2012 °ExitCare® Patient Information ©2015 [employer]. This information is not intended to replace advice given to you by your health care provider. Make sure you discuss any questions you have with your health care provider. ° ° °Emergency Department Resource Guide °1) Find a Doctor and Pay Out of Pocket °Although you won't have to find out who is covered by your insurance plan, it is a good idea to ask around and get recommendations. You will then need to call the office and see if the doctor you have chosen will accept you as a new patient and what types of options they offer for patients who are self-pay. Some doctors offer discounts or will set up payment plans for their patients who do not have insurance, but you will need to ask so you aren't surprised when you get to your appointment. ° °2) Contact Your Local Health Department °Not all health departments have doctors that can see patients for sick visits, but many do, so it is worth a call to see if yours does. If you don't know where your local health department is, you can check in your phone book. The CDC also has a tool to help you locate your state's health department, and many state websites also have listings of all of their local health departments. ° °3) Find a Walk-in Clinic °If your illness is not likely to be very severe or complicated, you may want to try a walk in clinic. These are popping up all over the country in pharmacies, drugstores, and shopping centers. They're usually staffed by nurse practitioners or physician assistants that have been trained to treat common illnesses and complaints. They're usually fairly quick and inexpensive. However, if you have serious medical issues or chronic  medical problems, these are probably not your best option. ° °No Primary Care Doctor: °- Call [facility] at  832-8000 - they can help you locate a primary care doctor that  accepts your insurance, provides certain services, etc. °- Physician Referral Service- 1-800-533-3463 ° °Chronic Pain Problems: °Organization         Address  Phone   Notes  °[facility]  (336) 297-2271 Patients need to be referred by their primary care doctor.  ° °Medication Assistance: °Organization         Address  Phone   Notes  °[place] [facility] [address] °[place], [place] [address] (336) 641-8030 --Must be a resident of [place] °-- Must have NO insurance coverage whatsoever (no Medicaid/ Medicare, etc.) °-- The pt. MUST have a primary care doctor that directs their care regularly and follows them in the community °  °[facility]  (866) 331-1348   °[employer]  (888) 892-1162   ° °Agencies that provide inexpensive medical care: °Organization         Address  Phone   Notes  °[facility]  (336) 832-8035   °[facility]    (  336) 832-7272   °[facility] [address] °[place], [place] [address] (336) 832-4777   °[facility] [address], °[place] (336) 271-4999   °[facility]    (336) 373-0678   °[facility]    (336) 272-1050   °[facility] ° [address], [place] Phone:  (336) 832-4444, Fax:  (336) 832-4440 Hours of Operation:  9 am - 6 pm, M-F.  Also accepts Medicaid/Medicare and self-pay.  °[facility] ° [address], [place] Phone: (336) 832-3150, Fax: (336) [phone]. Hours of Operation:  8:30 am - 5:30 pm, M-F.  Also accepts Medicaid and self-pay.  °[facility] [place] [address], [place] Phone: (336) 878-6027   °[facility] [address], [place], [place] (336)723-1848,  Ext. 123 Mondays & Thursdays: 7-9 AM.  First 15 patients are seen on a first come, first serve basis. °  ° °Medicaid-accepting [place] Providers: ° °Organization         Address  Phone   Notes  °[facility] [address], [place] (336) 641-2100 Also accepts self-pay patients.  °[facility] [address], [place] ° (336) 856-9996   °[facility] [address], [place] (336) 288-8857   °[facility] [address] [place] [address], [place] (336) 299-7000   °Veita Bland [address], [place]  ° (336) 373-1557 Only accepts [place] Access Medicaid patients after they have their name applied to their card.  ° °Self-Pay (no insurance) in [place]: ° °Organization         Address  Phone   Notes  °Sickle Cell Patients, [facility] [address], [place] (336) 832-1970   °[facility] [address], [place] (336) 832-4400   °[facility] [place] [facility] ° [address], [place] (336) 992-4800   °[facility]/Dr. Osei-Bonsu ° [address] [place] [address], [place] or [address], [place] (336) 841-8500 Phone number for both [place] and [place] locations is the same.  °[facility] [address], [place] (336) 299-0000   °[facility] [address] [place] [address], [place] or [address] (336) 852-7530 °(336) 878-2260   °[facility] [address], [place] (336) 350-1642, phone; (336) 294-5005, fax Sees patients 1st and 3rd Saturday of every month.  Must not qualify for public or private insurance (i.e. Medicaid, Medicare, [place] Health Choice, Veterans' Benefits) • Household income should be no more than 200% of the poverty level •The clinic cannot treat you if you are pregnant or think you are pregnant • Sexually transmitted diseases are not  treated at the clinic.  ° ° °Dental Care: °Organization         Address  Phone  Notes  °[facility] [address], [place] (336) 641-6152 Accepts children up to age 21 who are enrolled in Medicaid or [place] Health Choice; pregnant women with a Medicaid card; and children who have applied for Medicaid or  Health Choice, but were declined, whose parents can pay a reduced fee at time of service.  °[facility] [place]  [address], [place] (336) 641-7733 Accepts children up   to age 21 who are enrolled in Medicaid or [place] Health Choice; pregnant women with a Medicaid card; and children who have applied for Medicaid or [place] Health Choice, but were declined, whose parents can pay a reduced fee at time of service.  °[facility] ° [address], [place] (336) 641-4533 Patients are seen by appointment only. Walk-ins are not accepted. [facility] will see patients 18 years of age and older. °Monday - Tuesday (8am-5pm) °Most Wednesdays (8:30-5pm) °$30 per visit, cash only  °[facility] ° [address], [place] (336) 641-4533 Patients are seen by appointment only. Walk-ins are not accepted. [facility] will see patients 18 years of age and older. °One Wednesday Evening (Monthly: Volunteer Based).  $30 per visit, cash only  °[facility]  (919) 537-3737 for adults; Children under age 4, call Graduate Pediatric Dentistry at ([phone]) [phone]. Children aged 4-14, please call ([phone]) [phone] to request a pediatric application. ° Dental services are provided in all areas of dental care including fillings, crowns and bridges, complete and partial dentures, implants, gum treatment, root canals, and extractions. Preventive care is also provided. Treatment is provided to both adults and children. °Patients are selected via a lottery and there is  often a waiting list. °  °[facility] [address], °[place] ° (336) 763-8833 [email or web address] °  °[facility] [address], [place], [place] (336)723-1848, Ext. 123 Second and Fourth Thursday of each month, opens at 6:30 AM; Clinic ends at 9 AM.  Patients are seen on a first-come first-served basis, and a limited number are seen during each clinic.  ° °[facility] ° [address], [place], [place] (336) 723-7904   Eligibility Requirements °You must have lived in [place], [place], or [place] counties for at least the last three months. °  You cannot be eligible for state or federal sponsored healthcare insurance, including Veterans Administration, Medicaid, or Medicare. °  You generally cannot be eligible for healthcare insurance through your employer.  °  How to apply: °Eligibility screenings are held every Tuesday and Wednesday afternoon from 1:00 pm until 4:00 pm. You do not need an appointment for the interview!  °[facility] [address], [place], [place] 336-631-2330   °[facility]  336-342-8273   °[facility]  336-703-3100   °[facility]  336-570-6415   ° °Behavioral Health Resources in the Community: °Intensive Outpatient Programs °Organization         Address  Phone  Notes  °[facility] [address], [place], [place] 336-878-6098   °[facility] [address], [place], [place] 336-832-9800   °[facility] [address], [place], [place] ° 336-882-2125   °[facility] [address],  °[place],  1-800-853-5163 or 336-641-4981   °Substance Abuse Resources °Organization         Address  Phone  Notes  °[facility]  336-882-2125   °[facility]  336-784-9470   °[facility]  336-285-9073   °[facility]  336-845-3988   °Residential & Outpatient Substance  Abuse Program  1-800-659-3381   °Psychological Services °Organization         Address  Phone  Notes  °[facility]  336- 832-9600   °[facility]  336- 378-7881   °[facility]   [address], [place] 1-800-853-5163 or 336-641-4981   ° °Mobile Crisis Teams °Organization         Address  Phone  Notes  °[facility], Mobile Crisis Care Unit  1-877-626-1772   °Assertive °[facility] ° [address] [place], [place] 336-834-9664   °Sharon DeEsch [address] °[place] 336-554-5454   ° °Self-Help/Support Groups °Organization         Address  Phone             Notes  °[facility] - variety of support groups  336- 373-1402 Call for more information  °Narcotics Anonymous (NA), [facility] [address], °[place]  2 meetings at this location  ° °Residential Treatment Programs °Organization         Address  Phone  Notes  °[facility] [address],    °[place]  1-866-801-8205   °[facility] ° [address], [place], [place] 704-293-8524   °[facility] [address], [place] 336-845-3988 Admissions: 8am-3pm M-F  °[facility] [address]    °[place], [place] 336-841-1104   °[facility] [address], [place], [place] 336-379-7146   °[facility] [address]  °[place], [place] 336-285-9073   °[facility] Intensive Outpatient [address], [place], [place] 336-852-3033   °[facility] [address]  °[place], [place] 1-877-615-2722 or 336-784-9470   °[facility] [address] [place], [place] 336-227-7417 Accepts Medicaid  °[facility] [address]  ° [place] 1-800-659-3381 Substance Abuse/Addiction Treatment  ° °[place] Behavioral Health Resources °Organization          Address  Phone  Notes  °[facility]  (888) 581-9988   °Julie Brannon, PhD [address] [place], [place]   (336) 349-5553 or (336) 951-0000   °[facility]   [address] °[place], [place] (336) 349-4454   °[facility] [address], [place], [place] (336) 342-8316 Insurance/Medicaid/sponsorship through [facility]  °[facility] [address]                                    [place], [place] (336) 342-8316 Therapy/tele-psych/case  °[facility] [address]  ° [place], [place] (336) 349-2233    °Dr. Arfeen  (336) 349-4544   °[facility] [place]  [employer] [place] [facility] 1) [address], [place] °2) [address], [place] °3)  [address], [place] (336) 349-3220 °(336) 342-7768 ° °(336) 342-8140   °[place] [facility] (336) 342-1394 or (336) 342-3537 (After Hours)    ° ° ° °

## 2013-09-05 NOTE — ED Notes (Signed)
PT discharged to home with family. NAD. 

## 2015-03-19 ENCOUNTER — Encounter (HOSPITAL_BASED_OUTPATIENT_CLINIC_OR_DEPARTMENT_OTHER): Payer: Self-pay | Admitting: *Deleted

## 2015-03-19 ENCOUNTER — Emergency Department (HOSPITAL_BASED_OUTPATIENT_CLINIC_OR_DEPARTMENT_OTHER)
Admission: EM | Admit: 2015-03-19 | Discharge: 2015-03-19 | Disposition: A | Discharge status: Home / Self Care | Payer: Self-pay | Attending: Emergency Medicine | Admitting: Emergency Medicine

## 2015-03-19 ENCOUNTER — Emergency Department (HOSPITAL_BASED_OUTPATIENT_CLINIC_OR_DEPARTMENT_OTHER): Payer: Self-pay

## 2015-03-19 DIAGNOSIS — E079 Disorder of thyroid, unspecified: Secondary | ICD-10-CM | POA: Insufficient documentation

## 2015-03-19 DIAGNOSIS — F329 Major depressive disorder, single episode, unspecified: Secondary | ICD-10-CM | POA: Insufficient documentation

## 2015-03-19 DIAGNOSIS — M79604 Pain in right leg: Secondary | ICD-10-CM | POA: Insufficient documentation

## 2015-03-19 DIAGNOSIS — Z79899 Other long term (current) drug therapy: Secondary | ICD-10-CM | POA: Insufficient documentation

## 2015-03-19 DIAGNOSIS — B349 Viral infection, unspecified: Secondary | ICD-10-CM

## 2015-03-19 DIAGNOSIS — J9801 Acute bronchospasm: Secondary | ICD-10-CM

## 2015-03-19 DIAGNOSIS — Z7984 Long term (current) use of oral hypoglycemic drugs: Secondary | ICD-10-CM | POA: Insufficient documentation

## 2015-03-19 DIAGNOSIS — M79605 Pain in left leg: Secondary | ICD-10-CM | POA: Insufficient documentation

## 2015-03-19 DIAGNOSIS — J45901 Unspecified asthma with (acute) exacerbation: Secondary | ICD-10-CM | POA: Insufficient documentation

## 2015-03-19 LAB — CBC WITH DIFFERENTIAL/PLATELET
Basophils Absolute: 0 10*3/uL (ref 0.0–0.1)
Basophils Relative: 1 %
EOS ABS: 0.3 10*3/uL (ref 0.0–0.7)
Eosinophils Relative: 7 %
HCT: 37.3 % (ref 36.0–46.0)
HEMOGLOBIN: 11.6 g/dL — AB (ref 12.0–15.0)
LYMPHS PCT: 28 %
Lymphs Abs: 1.1 10*3/uL (ref 0.7–4.0)
MCH: 25.8 pg — AB (ref 26.0–34.0)
MCHC: 31.1 g/dL (ref 30.0–36.0)
MCV: 83.1 fL (ref 78.0–100.0)
MONO ABS: 0.3 10*3/uL (ref 0.1–1.0)
Monocytes Relative: 7 %
NEUTROS ABS: 2.2 10*3/uL (ref 1.7–7.7)
Neutrophils Relative %: 57 %
Platelets: 100 10*3/uL — ABNORMAL LOW (ref 150–400)
RBC: 4.49 MIL/uL (ref 3.87–5.11)
RDW: 14.8 % (ref 11.5–15.5)
WBC: 3.9 10*3/uL — ABNORMAL LOW (ref 4.0–10.5)

## 2015-03-19 LAB — BASIC METABOLIC PANEL
Anion gap: 8 (ref 5–15)
BUN: 8 mg/dL (ref 6–20)
CO2: 26 mmol/L (ref 22–32)
CREATININE: 0.6 mg/dL (ref 0.44–1.00)
Calcium: 8.8 mg/dL — ABNORMAL LOW (ref 8.9–10.3)
Chloride: 105 mmol/L (ref 101–111)
GFR calc Af Amer: >60 mL/min (ref >60)
GFR calc non Af Amer: >60 mL/min (ref >60)
Glucose, Bld: 265 mg/dL — ABNORMAL HIGH (ref 65–99)
Potassium: 3.3 mmol/L — ABNORMAL LOW (ref 3.5–5.1)
SODIUM: 139 mmol/L (ref 135–145)

## 2015-03-19 LAB — D-DIMER, QUANTITATIVE (NOT AT ARMC)

## 2015-03-19 MED ORDER — IPRATROPIUM-ALBUTEROL 0.5-2.5 (3) MG/3ML IN SOLN
3.0000 mL | Freq: Once | RESPIRATORY_TRACT | Status: AC
  Administered 2015-03-19: 3 mL via RESPIRATORY_TRACT
  Filled 2015-03-19: qty 3

## 2015-03-19 MED ORDER — IPRATROPIUM BROMIDE 0.02 % IN SOLN: 0.5000 mg | Freq: Once | RESPIRATORY_TRACT | Status: DC

## 2015-03-19 MED ORDER — ALBUTEROL SULFATE (2.5 MG/3ML) 0.083% IN NEBU
2.5000 mg | INHALATION_SOLUTION | Freq: Once | RESPIRATORY_TRACT | Status: AC
  Administered 2015-03-19: 2.5 mg via RESPIRATORY_TRACT
  Filled 2015-03-19: qty 3

## 2015-03-19 MED ORDER — PREDNISONE 20 MG PO TABS: ORAL_TABLET | ORAL | Status: DC

## 2015-03-19 MED ORDER — BENZONATATE 100 MG PO CAPS: 100.0000 mg | ORAL_CAPSULE | Freq: Three times a day (TID) | ORAL | Status: DC

## 2015-03-19 MED ORDER — ALBUTEROL SULFATE HFA 108 (90 BASE) MCG/ACT IN AERS: 1.0000 | INHALATION_SPRAY | Freq: Four times a day (QID) | RESPIRATORY_TRACT | Status: DC | PRN

## 2015-03-19 MED ORDER — PREDNISONE 50 MG PO TABS
60.0000 mg | ORAL_TABLET | Freq: Once | ORAL | Status: AC
  Administered 2015-03-19: 60 mg via ORAL
  Filled 2015-03-19: qty 1

## 2015-03-19 MED ORDER — ALBUTEROL (5 MG/ML) CONTINUOUS INHALATION SOLN
15.0000 mg/h | INHALATION_SOLUTION | Freq: Once | RESPIRATORY_TRACT | Status: AC
  Administered 2015-03-19: 15 mg/h via RESPIRATORY_TRACT
  Filled 2015-03-19: qty 20

## 2015-03-19 NOTE — Discharge Instructions (Signed)
Asthma, Acute Bronchospasm °Acute bronchospasm caused by asthma is also referred to as an asthma attack. Bronchospasm means your air passages become narrowed. The narrowing is caused by inflammation and tightening of the muscles in the air tubes (bronchi) in your lungs. This can make it hard to breathe or cause you to wheeze and cough. °CAUSES °Possible triggers are: °· Animal dander from the skin, hair, or feathers of animals. °· Dust mites contained in house dust. °· Cockroaches. °· Pollen from trees or grass. °· Mold. °· Cigarette or tobacco smoke. °· Air pollutants such as dust, household cleaners, hair sprays, aerosol sprays, paint fumes, strong chemicals, or strong odors. °· Cold air or weather changes. Cold air may trigger inflammation. Winds increase molds and pollens in the air. °· Strong emotions such as crying or laughing hard. °· Stress. °· Certain medicines such as aspirin or beta-blockers. °· Sulfites in foods and drinks, such as dried fruits and wine. °· Infections or inflammatory conditions, such as a flu, cold, or inflammation of the nasal membranes (rhinitis). °· Gastroesophageal reflux disease (GERD). GERD is a condition where stomach acid backs up into your esophagus. °· Exercise or strenuous activity. °SIGNS AND SYMPTOMS  °· Wheezing. °· Excessive coughing, particularly at night. °· Chest tightness. °· Shortness of breath. °DIAGNOSIS  °Your health care provider will ask you about your medical history and perform a physical exam. A chest X-ray or blood testing may be performed to look for other causes of your symptoms or other conditions that may have triggered your asthma attack.  °TREATMENT  °Treatment is aimed at reducing inflammation and opening up the airways in your lungs.  Most asthma attacks are treated with inhaled medicines. These include quick relief or rescue medicines (such as bronchodilators) and controller medicines (such as inhaled corticosteroids). These medicines are sometimes  given through an inhaler or a nebulizer. Systemic steroid medicine taken by mouth or given through an IV tube also can be used to reduce the inflammation when an attack is moderate or severe. Antibiotic medicines are only used if a bacterial infection is present.  °HOME CARE INSTRUCTIONS  °· Rest. °· Drink plenty of liquids. This helps the mucus to remain thin and be easily coughed up. Only use caffeine in moderation and do not use alcohol until you have recovered from your illness. °· Do not smoke. Avoid being exposed to secondhand smoke. °· You play a critical role in keeping yourself in good health. Avoid exposure to things that cause you to wheeze or to have breathing problems. °· Keep your medicines up-to-date and available. Carefully follow your health care provider's treatment plan. °· Take your medicine exactly as prescribed. °· When pollen or pollution is bad, keep windows closed and use an air conditioner or go to places with air conditioning. °· Asthma requires careful medical care. See your health care provider for a follow-up as advised. If you are more than [redacted] weeks pregnant and you were prescribed any new medicines, let your obstetrician know about the visit and how you are doing. Follow up with your health care provider as directed. °· After you have recovered from your asthma attack, make an appointment with your outpatient doctor to talk about ways to reduce the likelihood of future attacks. If you do not have a doctor who manages your asthma, make an appointment with a primary care doctor to discuss your asthma. °SEEK IMMEDIATE MEDICAL CARE IF:  °· You are getting worse. °· You have trouble breathing. If severe, call your local   emergency services (911 in the U.S.).  You develop chest pain or discomfort.  You are vomiting.  You are not able to keep fluids down.  You are coughing up yellow, green, brown, or bloody sputum.  You have a fever and your symptoms suddenly get worse.  You have  trouble swallowing. MAKE SURE YOU:   Understand these instructions.  Will watch your condition.  Will get help right away if you are not doing well or get worse.   This information is not intended to replace advice given to you by your health care provider. Make sure you discuss any questions you have with your health care provider.   Document Released: 05/15/2006 Document Revised: 02/02/2013 Document Reviewed: 08/05/2012 Elsevier Interactive Patient Education 2016 [employer].   Emergency Department Resource Guide 1) Find a Doctor and Pay Out of Pocket Although you won't have to find out who is covered by your insurance plan, it is a good idea to ask around and get recommendations. You will then need to call the office and see if the doctor you have chosen will accept you as a new patient and what types of options they offer for patients who are self-pay. Some doctors offer discounts or will set up payment plans for their patients who do not have insurance, but you will need to ask so you aren't surprised when you get to your appointment.  2) Contact Your Local Health Department Not all health departments have doctors that can see patients for sick visits, but many do, so it is worth a call to see if yours does. If you don't know where your local health department is, you can check in your phone book. The CDC also has a tool to help you locate your state's health department, and many state websites also have listings of all of their local health departments.  3) Find a [facility] If your illness is not likely to be very severe or complicated, you may want to try a walk in clinic. These are popping up all over the country in pharmacies, drugstores, and shopping centers. They're usually staffed by nurse practitioners or physician assistants that have been trained to treat common illnesses and complaints. They're usually fairly quick and inexpensive. However, if you have serious medical  issues or chronic medical problems, these are probably not your best option.  No Primary Care Doctor: - Call Health Connect at  [phone] - they can help you locate a primary care doctor that  accepts your insurance, provides certain services, etc. - Physician Referral Service- [phone]  Chronic Pain Problems: Organization         Address  Phone   Notes  [facility]  [phone] Patients need to be referred by their primary care doctor.   Medication Assistance: Organization         Address  Phone   Notes  [facility] Medication [facility] [address] [place], [place] [address] [phone] --Must be a resident of [facility] -- Must have NO insurance coverage whatsoever (no Medicaid/ Medicare, etc.) -- The pt. MUST have a primary care doctor that directs their care regularly and follows them in the community   [facility]ist  [phone]   [employer]  [phone]    Agencies that provide inexpensive medical care: Organization         Address  Phone   Notes  [place]  [phone]   Gershon Mussel  [facility]    [phone]   [facility] [place], [place] [address] [phone]   [place] [address] [place] [phone]   Planned Parenthood    [phone]   [facility]    [phone]   [place] and [place] [address], [place] Phone:  [phone], Fax:  [phone] Hours of Operation:  9 am - 6 pm, M-F.  Also accepts Medicaid/Medicare and self-pay.  [facility] for [place] [address], [address], [place] Phone: [phone], Fax: [phone]. Hours of Operation:  8:30 am - 5:30 pm, M-F.  Also accepts Medicaid and self-pay.  [facility] [address], [place] Phone: [phone]   [place], [place], [place]  [phone], Ext. [phone] Mondays & Thursdays: 7-9 AM.  First 15 patients are seen on a first come, first serve basis.    [place] Providers:  Organization         Address  Phone   Notes  [facility] [address], [place] [phone] Also accepts self-pay patients.  [facility] [id number] [place], [place]  [phone]   [place], [address], [place] [phone]   [facility] [address], [place] [phone]   Lucianne Lei [address], [place]   [phone] Only accepts [place] Access [place] patients after they have their name applied to their card.   Self-Pay (no insurance) in [facility]:  Organization         Address  Phone   Notes  Sickle Cell Patients, [facility] [place] [phone]   [facility] [place] [phone]   [facility] [place]  [place], [place], [place] [phone]   [facility]/Dr. Osei-Bonsu  [address], [place] or [address], [place] [phone] Phone number for both [place] and [place] locations is the same.  Urgent Medical and [facility] [address], [place] [phone]   [facility] [address], [place] or [address] [phone]   [facility] [address] [place] [phone], phone; [phone], fax Sees patients 1st and 3rd Saturday of every month.  Must not qualify for public or private insurance (i.e. Medicaid, Medicare, [place] Health Choice, Veterans' Benefits)  Household income should be no more than 200% of the poverty level The clinic cannot treat you if you are pregnant or think you are pregnant  Sexually transmitted  diseases are not treated at the clinic.    Dental Care: Organization         Address  Phone  Notes  [facility] Department of [facility] [phone] Accepts children up to age 52 who are enrolled in [place] or [place]; pregnant women with a Medicaid card; and children who have applied for Medicaid or [place] Health Choice, but were declined, whose parents can pay a reduced fee at time of service.  [facility] Department of [facility] [facility]  [address], [employer]  [place] [phone] Accepts children up to age 62 who are enrolled in Medicaid or [place]; pregnant women with a Medicaid card; and children who have applied for Medicaid or [place] Health Choice, but were declined, whose parents can pay a reduced fee at time of service.  [place] Adult Dental Access PROGRAM  [place] [phone] Patients are seen by appointment only. Walk-ins are not accepted. [place] will see patients 22 years of age and older. Monday - Tuesday (8am-5pm) Most Wednesdays (8:30-5pm) $30 per visit, cash only  [facility] Adult Dental Access PROGRAM  [address], [facility] [phone] Patients are seen by appointment only. Walk-ins are not accepted. [place] will see patients 76 years of age and older. One Wednesday Evening (Monthly: Volunteer Based).  $30 per visit, cash only  [place]  [phone] for adults; Children under age 15, call Graduate Pediatric Dentistry at [phone]. Children aged 43-14, please call [phone] to request a pediatric application.  Dental services are provided in all areas of dental care including fillings, crowns and bridges, complete and partial dentures, implants, gum treatment, root canals, and extractions. Preventive care is also provided. Treatment is provided to both adults and children. Patients are selected via a  lottery and there is often a waiting list.   [facility] [address], [place]  [phone] [email or web address]   [facility] [address] [place], [place] [phone], Ext. [phone] Second and Fourth Thursday of each month, opens at 6:30 AM; Clinic ends at 9 AM.  Patients are seen on a first-come first-served basis, and a limited number are seen during each clinic.   [facility]  [address] Hillard Danker [place], [place] [phone]   Eligibility Requirements You must have lived in [place], [place], or [place] counties for at least the last three months.   You cannot be eligible for state or federal sponsored [employer], including [employer], [place], or [employer].   You generally cannot be eligible for healthcare insurance through your employer.    How to apply: Eligibility screenings are held every Tuesday and Wednesday afternoon from 1:00 pm until 4:00 pm. You do not need an appointment for the interview!  [facility] [address], [place], [place]   [place]  [place] Department  [place]  [phone]    Behavioral Health Resources in the Community: Intensive Outpatient Programs Organization         Address  Phone  Notes  [place]. [address] [place], [place] [phone]   [facility] [address] [place], [place]   ADS: Alcohol & Drug Svcs [address] [place], [place]   [place] [address]  [place], [place] or [phone]   Substance Abuse Resources Organization         Address  Phone  Notes  Alcohol and Drug Services  [phone]   [place]  [phone]   [facility] [place]   Chinita Pester  [phone]   Residential &  Outpatient Substance Abuse Program  [phone]   Psychological Services Organization         Address  Phone  Notes  [place]  [place]  [phone]  [place] [address], [place] or [phone]    Mobile Crisis Teams Organization         Address  Phone  Notes  [facility], Mobile Crisis Care Unit  [phone]   [facility]ive Psychotherapeutic Services  [address] [place], [place]   [place] [address], [place] [phone]    Self-Help/Support Groups Organization         Address  Phone             Notes  [place]. of [place] - variety of support groups  [place] Call for more information  Narcotics Anonymous (NA), [facility]aring [facility] [address], [employer] [place]  2 meetings at this location   Special educational needs teacher         Address  Phone  Notes  [facility]AP Residential Treatment [place],    [place]  [phone]   [facility]  [address] [place] [id number], [place], [place]   [place], [place] [phone] Admissions: 8am-3pm M-F  Incentives Substance [place] [address]    [place], [place] [phone]   [facility] [address] [place], [place], [place]   [facility] [address]  [place], [place]   [facility] Programs - Intensive Outpatient [address] Kristeen [address], [place], [place]   [facility] ([place].) [place].,  [place], [place] [phone] or [phone]   [facility]idential Treatment Services (RTS) [address] [employer], [place] Accepts Medicaid  [facility] [place] [address],  [place] [phone] Substance Abuse/Addiction Treatment   [facility] Organization          Address  Phone  Notes  [facility]  [phone]   Domenic Schwab, PhD [address] [place]lis [place], [place]   [phone] or [phone]   [place], [place] [phone]   [facility] [address], [place], [place] [phone] Insurance/Medicaid/sponsorship through [facility] and Families [address] [place]                                    [place], [place] [phone] [place] [address], [place] [phone]    Dr. Adele Schilder  [phone]   [facility] Dept. 1) [address] [place] 2) [place] 3)  [place] [address], [place] [phone]  [phone]   [place] [phone] or [phone] (After Hours)

## 2015-03-19 NOTE — ED Notes (Signed)
Ambulated in hallway on RA, SpO2 95-100%, -DOE, HR 115-120, RR 20-26.  After 15mg  Albuterol NEB/1 hr.

## 2015-03-19 NOTE — ED Provider Notes (Signed)
CSN: [id number]     Arrival date & time 03/19/15  1327 History   First MD Initiated Contact with Patient 03/19/15 1526     Chief Complaint  Patient presents with  . Shortness of Breath     (Consider location/radiation/quality/duration/timing/severity/associated sxs/prior Treatment) HPI  45 year old female with history of asthma who presents for evaluation of shortness of breath.  For the past 1 week she has had worsening SOB.  She has been using her inhaler 2-3 times daily within this week as compare to using once every 3 months. SOB worsen with walking or laying flat. Endorse congestion and post nasal drips with non productive cough which precedes her SOB. A week ago pt went on a 9 hr car drive from [place] to here.  Afterward she developed bilateral leg pain worse on the right.  The leg swelling has since resolved.  Pt denies prior hx of PE/DVT but did report that mom has hx of blood clot and had succumb to it.  Pt does not take oral hormone, no hx of active cancer, no hemoptysis. She denies fever, chills, , chest pain, abdominal pain,  dizziness or syncope. Denies any other environmental changes. She does not have a primary care provider here as she recently moved to [facility].    Past Medical History  Diagnosis Date  . Thyroid disease   . Depression   . Asthma    Past Surgical History  Procedure Laterality Date  . Carpal tunnel release    . Cesarean section    . Sinus exploration    . Dilation and curettage of uterus     History reviewed. No pertinent family history. Social History  Substance Use Topics  . Smoking status: Never Smoker   . Smokeless tobacco: None  . Alcohol Use: No   OB History    No data available     Review of Systems  All other systems reviewed and are negative.     Allergies  Ciprofloxacin  Home Medications   Prior to Admission medications   Medication Sig Start Date End Date Taking? Authorizing Provider  citalopram (CELEXA) 20 MG tablet Take 20 mg  by mouth daily.   Yes Historical Provider, MD  metFORMIN (GLUCOPHAGE) 1000 MG tablet Take 1,000 mg by mouth 2 (two) times daily with a meal.   Yes Historical Provider, MD  levothyroxine (SYNTHROID, LEVOTHROID) 100 MCG tablet Take 1 tablet (100 mcg total) by mouth daily before breakfast. 09/05/13   Dorie Rank, MD   BP 144/80 mmHg  Pulse 85  Temp(Src) 98.3 F (36.8 C) (Oral)  Resp 20  Ht 5\' 9"  (1.753 m)  Wt 176.903 kg  BMI 57.57 kg/m2  SpO2 97% Physical Exam  Constitutional: She appears well-developed and well-nourished. No distress.  Morbidly  obese Caucasian female appears to be in no acute distress  HENT:  Head: Atraumatic.  Right Ear: External ear normal.  Left Ear: External ear normal.  Nose: Nose normal.  Mouth/Throat: Oropharynx is clear and moist. No oropharyngeal exudate.  Audible wheezes heard.  Eyes: Conjunctivae are normal.  Neck: Neck supple. No tracheal deviation present.  Cardiovascular: Normal rate and regular rhythm.   Pulmonary/Chest: She has wheezes (inspiratory and expiratory wheezes without rales or rhonchi.).  Musculoskeletal:  Bilateral extremities without palpable cords, erythema, or edema. Mild tenderness noted to right posterior calf on palpation. Intact distal pedal pulses.  Neurological: She is alert.  Skin: No rash noted.  Psychiatric: She has a normal mood and affect.  Nursing  note and vitals reviewed.   ED Course  Procedures (including critical care time) Labs Review Labs Reviewed  CBC WITH DIFFERENTIAL/PLATELET - Abnormal; Notable for the following:    WBC 3.9 (*)    Hemoglobin 11.6 (*)    MCH 25.8 (*)    Platelets 100 (*)    All other components within normal limits  BASIC METABOLIC PANEL - Abnormal; Notable for the following:    Potassium 3.3 (*)    Glucose, Bld 265 (*)    Calcium 8.8 (*)    All other components within normal limits  D-DIMER, QUANTITATIVE (NOT AT [facility])    Imaging Review Dg Chest 2 View  03/19/2015  CLINICAL DATA:   Shortness of breath, wheezing for 1 week. EXAM: CHEST  2 VIEW COMPARISON:  03/23/2013 FINDINGS: The heart size and mediastinal contours are within normal limits. Both lungs are clear. The visualized skeletal structures are unremarkable. IMPRESSION: No active cardiopulmonary disease. Electronically Signed   By: Kathreen Devoid   On: 03/19/2015 14:17   I have personally reviewed and evaluated these images and lab results as part of my medical decision-making.   EKG Interpretation None      MDM   Final diagnoses:  Acute bronchospasm due to viral infection    BP 144/80 mmHg  Pulse 85  Temp(Src) 98.3 F (36.8 C) (Oral)  Resp 20  Ht 5\' 9"  (1.753 m)  Wt 176.903 kg  BMI 57.57 kg/m2  SpO2 99%   4:14 PM Patient here with what appears to be acute bronchospasm secondary to recent URI symptoms. She is actively wheezing but is improving after receiving several breathing treatment in the ED. She is however had a recent long car ride socially complaining of pain to her lower extremities and calf. No appreciable edema noted to lower extremities however she reportedly had a family history of DVT involving her mom and states that her mom passed away due to DVT complication. Therefore, I will obtain a d-dimer to rule out PE as the cause of her symptoms. We'll continue with breathing treatment and monitor closely, basic labs ordered.  4:53 PM D-dimer is negative therefore I have low suspicion for PE causing her symptoms. Patient does have a history of diabetes, non-insulin-dependent. Patient made aware that taking steroids I prescribed can cause her blood sugars to be elevated and she needs to monitor it closely. Since patient does not have a primary care provider,  resources provided to use as needed. Return precautions discussed. Otherwise patient feels better after receiving nebulizer treatment.  Domenic Moras, PA-C 03/19/15 1707  Davonna Belling, MD 03/21/15 2200

## 2015-03-19 NOTE — ED Notes (Addendum)
SOB that started 1 week ago with relief from inhaler.  Reports inhaler isnt working today.  Labored breathing, inspiratory and expiratory wheezing.  Low grade fever earlier in the week.

## 2015-03-30 ENCOUNTER — Emergency Department (HOSPITAL_BASED_OUTPATIENT_CLINIC_OR_DEPARTMENT_OTHER): Payer: Self-pay

## 2015-03-30 ENCOUNTER — Encounter (HOSPITAL_BASED_OUTPATIENT_CLINIC_OR_DEPARTMENT_OTHER): Payer: Self-pay

## 2015-03-30 ENCOUNTER — Inpatient Hospital Stay (HOSPITAL_BASED_OUTPATIENT_CLINIC_OR_DEPARTMENT_OTHER)
Admission: EM | Admit: 2015-03-30 | Discharge: 2015-04-02 | DRG: 202 | Disposition: A | Discharge status: Home / Self Care | Payer: Self-pay | Attending: Internal Medicine | Admitting: Internal Medicine

## 2015-03-30 ENCOUNTER — Institutional Professional Consult (permissible substitution): Payer: Self-pay | Admitting: Internal Medicine

## 2015-03-30 DIAGNOSIS — F329 Major depressive disorder, single episode, unspecified: Secondary | ICD-10-CM | POA: Diagnosis present

## 2015-03-30 DIAGNOSIS — D696 Thrombocytopenia, unspecified: Secondary | ICD-10-CM

## 2015-03-30 DIAGNOSIS — B369 Superficial mycosis, unspecified: Secondary | ICD-10-CM | POA: Diagnosis present

## 2015-03-30 DIAGNOSIS — J45909 Unspecified asthma, uncomplicated: Secondary | ICD-10-CM | POA: Diagnosis present

## 2015-03-30 DIAGNOSIS — Z79899 Other long term (current) drug therapy: Secondary | ICD-10-CM

## 2015-03-30 DIAGNOSIS — Z7984 Long term (current) use of oral hypoglycemic drugs: Secondary | ICD-10-CM

## 2015-03-30 DIAGNOSIS — IMO0002 Reserved for concepts with insufficient information to code with codable children: Secondary | ICD-10-CM

## 2015-03-30 DIAGNOSIS — T380X5A Adverse effect of glucocorticoids and synthetic analogues, initial encounter: Secondary | ICD-10-CM | POA: Diagnosis present

## 2015-03-30 DIAGNOSIS — R000 Tachycardia, unspecified: Secondary | ICD-10-CM | POA: Diagnosis present

## 2015-03-30 DIAGNOSIS — E1165 Type 2 diabetes mellitus with hyperglycemia: Secondary | ICD-10-CM

## 2015-03-30 DIAGNOSIS — Z6841 Body Mass Index (BMI) 40.0 and over, adult: Secondary | ICD-10-CM

## 2015-03-30 DIAGNOSIS — E876 Hypokalemia: Secondary | ICD-10-CM | POA: Diagnosis present

## 2015-03-30 DIAGNOSIS — J45901 Unspecified asthma with (acute) exacerbation: Principal | ICD-10-CM | POA: Diagnosis present

## 2015-03-30 DIAGNOSIS — R0902 Hypoxemia: Secondary | ICD-10-CM

## 2015-03-30 DIAGNOSIS — E0965 Drug or chemical induced diabetes mellitus with hyperglycemia: Secondary | ICD-10-CM | POA: Diagnosis present

## 2015-03-30 DIAGNOSIS — E039 Hypothyroidism, unspecified: Secondary | ICD-10-CM | POA: Diagnosis present

## 2015-03-30 DIAGNOSIS — J9601 Acute respiratory failure with hypoxia: Secondary | ICD-10-CM | POA: Diagnosis present

## 2015-03-30 DIAGNOSIS — Z881 Allergy status to other antibiotic agents status: Secondary | ICD-10-CM

## 2015-03-30 DIAGNOSIS — J4541 Moderate persistent asthma with (acute) exacerbation: Secondary | ICD-10-CM

## 2015-03-30 LAB — CBC WITH DIFFERENTIAL/PLATELET
Basophils Absolute: 0 10*3/uL (ref 0.0–0.1)
Basophils Relative: 1 %
EOS ABS: 0.4 10*3/uL (ref 0.0–0.7)
EOS PCT: 10 %
HCT: 39.5 % (ref 36.0–46.0)
Hemoglobin: 12.2 g/dL (ref 12.0–15.0)
LYMPHS ABS: 0.9 10*3/uL (ref 0.7–4.0)
LYMPHS PCT: 21 %
MCH: 25.6 pg — AB (ref 26.0–34.0)
MCHC: 30.9 g/dL (ref 30.0–36.0)
MCV: 82.8 fL (ref 78.0–100.0)
MONOS PCT: 8 %
Monocytes Absolute: 0.4 10*3/uL (ref 0.1–1.0)
Neutro Abs: 2.7 10*3/uL (ref 1.7–7.7)
Neutrophils Relative %: 61 %
PLATELETS: 116 10*3/uL — AB (ref 150–400)
RBC: 4.77 MIL/uL (ref 3.87–5.11)
RDW: 15.6 % — ABNORMAL HIGH (ref 11.5–15.5)
WBC: 4.4 10*3/uL (ref 4.0–10.5)

## 2015-03-30 LAB — I-STAT ARTERIAL BLOOD GAS, ED
ACID-BASE DEFICIT: 1 mmol/L (ref 0.0–2.0)
BICARBONATE: 21.8 meq/L (ref 20.0–24.0)
O2 SAT: 95 %
TCO2: 23 mmol/L (ref 0–100)
pCO2 arterial: 29.4 mmHg — ABNORMAL LOW (ref 35.0–45.0)
pH, Arterial: 7.476 — ABNORMAL HIGH (ref 7.350–7.450)
pO2, Arterial: 68 mmHg — ABNORMAL LOW (ref 80.0–100.0)

## 2015-03-30 LAB — GLUCOSE, CAPILLARY: GLUCOSE-CAPILLARY: 386 mg/dL — AB (ref 65–99)

## 2015-03-30 LAB — BRAIN NATRIURETIC PEPTIDE: B NATRIURETIC PEPTIDE 5: 9.5 pg/mL (ref 0.0–100.0)

## 2015-03-30 LAB — BASIC METABOLIC PANEL
Anion gap: 11 (ref 5–15)
BUN: 8 mg/dL (ref 6–20)
CHLORIDE: 104 mmol/L (ref 101–111)
CO2: 25 mmol/L (ref 22–32)
CREATININE: 0.71 mg/dL (ref 0.44–1.00)
Calcium: 8.8 mg/dL — ABNORMAL LOW (ref 8.9–10.3)
GFR calc Af Amer: >60 mL/min (ref >60)
GFR calc non Af Amer: >60 mL/min (ref >60)
GLUCOSE: 182 mg/dL — AB (ref 65–99)
POTASSIUM: 3.3 mmol/L — AB (ref 3.5–5.1)
Sodium: 140 mmol/L (ref 135–145)

## 2015-03-30 LAB — TROPONIN I: Troponin I: <0.03 ng/mL (ref <0.031)

## 2015-03-30 MED ORDER — POTASSIUM CHLORIDE CRYS ER 20 MEQ PO TBCR
40.0000 meq | EXTENDED_RELEASE_TABLET | Freq: Once | ORAL | Status: AC
  Administered 2015-03-30: 40 meq via ORAL
  Filled 2015-03-30: qty 2

## 2015-03-30 MED ORDER — MAGNESIUM SULFATE 50 % IJ SOLN
1.0000 g | INTRAMUSCULAR | Status: AC
  Administered 2015-03-30: 1 g via INTRAVENOUS

## 2015-03-30 MED ORDER — ALBUTEROL SULFATE (2.5 MG/3ML) 0.083% IN NEBU: 2.5000 mg | INHALATION_SOLUTION | RESPIRATORY_TRACT | Status: DC | PRN

## 2015-03-30 MED ORDER — PREDNISONE 20 MG PO TABS: 40.0000 mg | ORAL_TABLET | Freq: Every day | ORAL | Status: DC

## 2015-03-30 MED ORDER — ONDANSETRON HCL 4 MG/2ML IJ SOLN: 4.0000 mg | Freq: Four times a day (QID) | INTRAMUSCULAR | Status: DC | PRN

## 2015-03-30 MED ORDER — ALBUTEROL (5 MG/ML) CONTINUOUS INHALATION SOLN
INHALATION_SOLUTION | RESPIRATORY_TRACT | Status: AC
  Administered 2015-03-30: 15 mg/h via RESPIRATORY_TRACT
  Filled 2015-03-30: qty 20

## 2015-03-30 MED ORDER — DEXTROSE 5 % IV SOLN
1.0000 g | Freq: Once | INTRAVENOUS | Status: AC
  Administered 2015-03-30: 1 g via INTRAVENOUS
  Filled 2015-03-30: qty 10

## 2015-03-30 MED ORDER — AZITHROMYCIN 250 MG PO TABS
500.0000 mg | ORAL_TABLET | Freq: Once | ORAL | Status: AC
  Administered 2015-03-30: 500 mg via ORAL
  Filled 2015-03-30: qty 2

## 2015-03-30 MED ORDER — INSULIN ASPART 100 UNIT/ML ~~LOC~~ SOLN
0.0000 [IU] | Freq: Every day | SUBCUTANEOUS | Status: DC
  Administered 2015-03-30: 5 [IU] via SUBCUTANEOUS
  Administered 2015-03-31 – 2015-04-01 (×2): 4 [IU] via SUBCUTANEOUS

## 2015-03-30 MED ORDER — GUAIFENESIN ER 600 MG PO TB12
600.0000 mg | ORAL_TABLET | Freq: Two times a day (BID) | ORAL | Status: DC
  Administered 2015-03-30 – 2015-04-02 (×6): 600 mg via ORAL
  Filled 2015-03-30 (×7): qty 1

## 2015-03-30 MED ORDER — MAGNESIUM SULFATE 50 % IJ SOLN
INTRAMUSCULAR | Status: AC
  Administered 2015-03-30: 1 g
  Filled 2015-03-30: qty 2

## 2015-03-30 MED ORDER — LEVOTHYROXINE SODIUM 100 MCG PO TABS: 100.0000 ug | ORAL_TABLET | Freq: Every day | ORAL | Status: DC

## 2015-03-30 MED ORDER — IPRATROPIUM BROMIDE 0.02 % IN SOLN
RESPIRATORY_TRACT | Status: AC
  Administered 2015-03-30: 0.5 mg
  Filled 2015-03-30: qty 2.5

## 2015-03-30 MED ORDER — ENOXAPARIN SODIUM 40 MG/0.4ML ~~LOC~~ SOLN
40.0000 mg | SUBCUTANEOUS | Status: DC
  Administered 2015-03-30: 40 mg via SUBCUTANEOUS
  Filled 2015-03-30: qty 0.4

## 2015-03-30 MED ORDER — ALBUTEROL (5 MG/ML) CONTINUOUS INHALATION SOLN
10.0000 mg/h | INHALATION_SOLUTION | RESPIRATORY_TRACT | Status: AC
  Administered 2015-03-30: 15 mg/h via RESPIRATORY_TRACT

## 2015-03-30 MED ORDER — HYDROCODONE-ACETAMINOPHEN 5-325 MG PO TABS: 1.0000 | ORAL_TABLET | Freq: Four times a day (QID) | ORAL | Status: DC | PRN

## 2015-03-30 MED ORDER — MORPHINE SULFATE (PF) 2 MG/ML IV SOLN
1.0000 mg | INTRAVENOUS | Status: DC | PRN
Start: 2015-03-30 — End: 2015-04-02

## 2015-03-30 MED ORDER — BENZONATATE 100 MG PO CAPS
200.0000 mg | ORAL_CAPSULE | Freq: Two times a day (BID) | ORAL | Status: DC | PRN
  Administered 2015-03-31 – 2015-04-02 (×5): 200 mg via ORAL
  Filled 2015-03-30 (×6): qty 2

## 2015-03-30 MED ORDER — POTASSIUM CHLORIDE 20 MEQ PO PACK: 40.0000 meq | PACK | Freq: Once | ORAL | Status: DC

## 2015-03-30 MED ORDER — IPRATROPIUM-ALBUTEROL 0.5-2.5 (3) MG/3ML IN SOLN
3.0000 mL | RESPIRATORY_TRACT | Status: DC
  Administered 2015-03-30 – 2015-03-31 (×4): 3 mL via RESPIRATORY_TRACT
  Filled 2015-03-30 (×4): qty 3

## 2015-03-30 MED ORDER — METHYLPREDNISOLONE SODIUM SUCC 125 MG IJ SOLR
125.0000 mg | Freq: Once | INTRAMUSCULAR | Status: AC
  Administered 2015-03-30: 125 mg via INTRAVENOUS
  Filled 2015-03-30: qty 2

## 2015-03-30 MED ORDER — CITALOPRAM HYDROBROMIDE 20 MG PO TABS
20.0000 mg | ORAL_TABLET | Freq: Every day | ORAL | Status: DC
  Administered 2015-03-31: 20 mg via ORAL
  Filled 2015-03-30 (×2): qty 1

## 2015-03-30 MED ORDER — INSULIN ASPART 100 UNIT/ML ~~LOC~~ SOLN
0.0000 [IU] | Freq: Three times a day (TID) | SUBCUTANEOUS | Status: DC
  Administered 2015-03-31: 5 [IU] via SUBCUTANEOUS
  Administered 2015-03-31: 3 [IU] via SUBCUTANEOUS

## 2015-03-30 MED ORDER — ACETAMINOPHEN 650 MG RE SUPP: 650.0000 mg | Freq: Four times a day (QID) | RECTAL | Status: DC | PRN

## 2015-03-30 MED ORDER — BUDESONIDE 0.5 MG/2ML IN SUSP
0.5000 mg | Freq: Two times a day (BID) | RESPIRATORY_TRACT | Status: DC
  Administered 2015-03-30 – 2015-04-02 (×6): 0.5 mg via RESPIRATORY_TRACT
  Filled 2015-03-30 (×6): qty 2

## 2015-03-30 MED ORDER — ONDANSETRON HCL 4 MG PO TABS: 4.0000 mg | ORAL_TABLET | Freq: Four times a day (QID) | ORAL | Status: DC | PRN

## 2015-03-30 MED ORDER — ACETAMINOPHEN 325 MG PO TABS
650.0000 mg | ORAL_TABLET | Freq: Four times a day (QID) | ORAL | Status: DC | PRN
  Administered 2015-03-31: 650 mg via ORAL
  Filled 2015-03-30: qty 2

## 2015-03-30 NOTE — ED Notes (Signed)
Left radial artery x 1 attempt for ABG. Pt tolerated well. No complications noted. Bleeding stopped.

## 2015-03-30 NOTE — ED Notes (Signed)
Report given to shannon on 3 w at [facility] long

## 2015-03-30 NOTE — ED Provider Notes (Signed)
CSN: [id number]     Arrival date & time 03/30/15  1323 History   First MD Initiated Contact with Patient 03/30/15 1501     Chief Complaint  Patient presents with  . Shortness of Breath     (Consider location/radiation/quality/duration/timing/severity/associated sxs/prior Treatment) HPI  Janet [name] Is a 45 year old female presents emergency Department with chief complaint of asthma exacerbation. She was seen on the fifth for the same complaint and diagnosed with acute asked my exacerbation after URI. Patient was treated here in the emergency department with improvement in her symptoms and discharged with prednisone. Patient states she was better for about 3 days and then went back into having severe shortness of breath. She's been using an albuterol inhaler with only minimal relief of her symptoms. Today, she used her inhaler 3 times without any relief of her symptoms. He came in for further evaluation. She has a history of asthma but denies a history of hospitalization or intubation. She denies fevers or chills, she denies history of pulmonary embolus or DVT. She denies hemoptysis.  Past Medical History  Diagnosis Date  . Thyroid disease   . Depression   . Asthma    Past Surgical History  Procedure Laterality Date  . Carpal tunnel release    . Cesarean section    . Sinus exploration    . Dilation and curettage of uterus     No family history on file. Social History  Substance Use Topics  . Smoking status: Never Smoker   . Smokeless tobacco: None  . Alcohol Use: No   OB History    No data available     Review of Systems   Ten systems reviewed and are negative for acute change, except as noted in the HPI.   Allergies  Ciprofloxacin  Home Medications   Prior to Admission medications   Medication Sig Start Date End Date Taking? Authorizing Provider  albuterol (PROVENTIL HFA;VENTOLIN HFA) 108 (90 Base) MCG/ACT inhaler Inhale 1-2 puffs into the lungs every 6 (six)  hours as needed for wheezing or shortness of breath. 03/19/15   Domenic Moras, PA-C  citalopram (CELEXA) 20 MG tablet Take 20 mg by mouth daily.    Historical Provider, MD  levothyroxine (SYNTHROID, LEVOTHROID) 100 MCG tablet Take 1 tablet (100 mcg total) by mouth daily before breakfast. 09/05/13   Dorie Rank, MD  metFORMIN (GLUCOPHAGE) 1000 MG tablet Take 1,000 mg by mouth 2 (two) times daily with a meal.    Historical Provider, MD   BP 130/45 mmHg  Pulse 106  Resp 26  SpO2 96% Physical Exam  Constitutional: She is oriented to person, place, and time. She appears well-developed and well-nourished. No distress.  HENT:  Head: Normocephalic and atraumatic.  Eyes: Conjunctivae are normal. No scleral icterus.  Neck: Normal range of motion.  Cardiovascular: Normal rate, regular rhythm and normal heart sounds.  Exam reveals no gallop and no friction rub.   No murmur heard. Pulmonary/Chest: No respiratory distress. She has wheezes.  Patient with prolonged expiratory phase Diffuse inspiratory and expiratory wheezes. Speaking in full sentences.  Abdominal: Soft. Bowel sounds are normal. She exhibits no distension and no mass. There is no tenderness. There is no guarding.  Neurological: She is alert and oriented to person, place, and time.  Skin: Skin is warm and dry. She is not diaphoretic.  Nursing note and vitals reviewed.   ED Course  Procedures (including critical care time) Labs Review Labs Reviewed  I-STAT ARTERIAL BLOOD GAS, ED -  Abnormal; Notable for the following:    pH, Arterial 7.476 (*)    pCO2 arterial 29.4 (*)    pO2, Arterial 68.0 (*)    All other components within normal limits  BASIC METABOLIC PANEL  CBC WITH DIFFERENTIAL/PLATELET  BRAIN NATRIURETIC PEPTIDE  TROPONIN I    Imaging Review Dg Chest 2 View  03/30/2015  CLINICAL DATA:  Shortness of breath for 2 weeks. EXAM: CHEST  2 VIEW COMPARISON:  March 19, 2015. FINDINGS: The heart size and mediastinal contours are within  normal limits. Both lungs are clear. No pneumothorax or pleural effusion is noted. The visualized skeletal structures are unremarkable. IMPRESSION: No active cardiopulmonary disease. Electronically Signed   By: Marijo Conception, M.D.   On: 03/30/2015 15:31   I have personally reviewed and evaluated these images and lab results as part of my medical decision-making.   EKG Interpretation None      MDM   Final diagnoses:  RAD (reactive airway disease) with wheezing, moderate persistent, with acute exacerbation   4:42 PM BP 128/45 mmHg  Pulse 103  Resp 13  SpO2 98%   Upon my initial evaluation, the patient had already undergone an hour-long 10 mg neb treatment. She diffuse inspiratory and expiratory wheezing. Patient has now received 2 hour-long nebulizer treatments, 125 mg of IV Solu-Medrol, 1 g of IV mag oxide. She states that she is feeling better and her lungs sounds are somewhat improved. However, she is hypoxic at rest with O2 saturations dropping down to 89% on room air. Feel that should the patient now meets criteria for inpatient admission. I'll call for admission from hospitalist group.  5:16 PM I spoken with Dr. Sanjuana Letters who will admit the patient at [facility]. He requests that we give antibiotics. I have given the patient Rocephin and oral azithromycin. Patient will be admitted. She is currently 98% on 3 L of oxygen via nasal cannula. Patient's oxygen saturations low prior to that. She appears stable for transfer.  Margarita Mail, PA-C 03/30/15 1717  Veryl Speak, MD 04/02/15 [phone]

## 2015-03-30 NOTE — Progress Notes (Signed)
Pt admitted at 2100 from [facility] via [place]. A&Ox4, ambulatory, SOB with exertion. O2 at 3L [place]. Denies pain. Coughing/wheezing, but denies fever and no fever documented since arrival to ED, so does not meet criteria for droplet precations. Oriented to unit/room and treatment plan. Continue to monitor. Hortencia Conradi RN

## 2015-03-30 NOTE — ED Notes (Signed)
Report given to [facility] 

## 2015-03-30 NOTE — ED Notes (Signed)
Patient with dyspnea with rest, talking well but not in complete sentences. The patient is able to answer in 1 -2 words at a time. The patient is resting on continuous neb treatment at this time

## 2015-03-30 NOTE — ED Notes (Signed)
Continuous completed.

## 2015-03-30 NOTE — ED Notes (Signed)
C/o SOB, wheezing x 2 weeks-pt with audible wheezing, tachypnea, labored resp-taken to tx room by RT

## 2015-03-30 NOTE — ED Notes (Signed)
Attempted report, rn given medications at this time will return call to [phone] per [facility]

## 2015-03-30 NOTE — H&P (Signed)
[facility] History and Physical  Janet [name] [id number] DOB: May 14, 1970 DOA: 03/30/2015  Referring physician: [facility] [place] ED PCP: No PCP Per Patient   Chief Complaint: Shortness of breath  HPI:  Janet [name] is a 45 year old female with a past medical history significant for hypothyroidism, diabetes mellitus type 2, morbid obesity, and asthma; who presented to [facility] with complaints of shortness of breath and cough. States symptoms initially started around the earlier part of this month after moving here from [place]. She reports having to utilize her inhaler 2-3 times daily. At that time she had congestion and postnasal drip and a nonproductive cough. Evaluated in the emergency department on 2/5 and was given prednisone, Tessalon Perles, albuterol inhaler and discharged home. Patient felt better the initial first 2 days thereafter symptoms of progressively returned of sneezing, dry cough, wheezing, nasal congestion, and sinus pressure. Evaluated again and given amoxicillin for possible sinus infection which she reports taking the last dose of the day before yesterday. This helped improve her sinus congestion and relieved her sinus pressure complaints. During this time however breathing symptoms had progressively worsened. She reports utilizing her inhaler 2-3 times per day, and the cold been out of her sleep at least 3-4 times a night coughing and/or short of breath. This morning she reports losing her breath just getting out of bed. Patient reports having a problem with the pulmonologist scheduled for March night. She's been also utilizing generic Claritin without relief of symptoms. 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 Denies any fever, chills, tobacco abuse, or diarrhea. Endorses seasonal allergies. Patient notes that she is unsure of dosage of her level of thyroxine and previously was  told that her last hemoglobin A1c was 10 months ago. She states that her fasting blood glucoses are usually less than 160.  Upon evaluation at the emergency department chest x-ray showed no acute abnormalities. Given a continuous albuterol treatment with 125 mg of methylprednisolone. Patient was initially started on azithromycin and Rocephin for question of failed outpatient treatment.  Review of Systems  Constitutional: Positive for malaise/fatigue and diaphoresis. Negative for fever and chills.  HENT: Positive for congestion. Negative for hearing loss.        Sneezing  Eyes: Negative for double vision and photophobia.  Respiratory: Positive for cough, shortness of breath and wheezing. Negative for hemoptysis.   Cardiovascular: Positive for chest pain (With coughing) and leg swelling.  Gastrointestinal: Negative for nausea, vomiting and abdominal pain.  Genitourinary: Negative for urgency and frequency.  Musculoskeletal: Negative for falls and neck pain.  Skin: Negative for itching.  Neurological: Negative for speech change and focal weakness.  Endo/Heme/Allergies: Positive for environmental allergies. Does not bruise/bleed easily.  Psychiatric/Behavioral: Negative for suicidal ideas and substance abuse.         Past Medical History  Diagnosis Date  . Thyroid disease   . Depression   . Asthma      Past Surgical History  Procedure Laterality Date  . Carpal tunnel release    . Cesarean section    . Sinus exploration    . Dilation and curettage of uterus        Social History:  reports that she has never smoked. She does not have any smokeless tobacco history on file. She reports that she does not drink alcohol or use illicit drugs.   Allergies  Allergen Reactions  . Ciprofloxacin  No family history on file.     Prior to Admission medications   Medication Sig Start Date End Date Taking? Authorizing Provider  albuterol (PROVENTIL HFA;VENTOLIN HFA) 108 (90  Base) MCG/ACT inhaler Inhale 1-2 puffs into the lungs every 6 (six) hours as needed for wheezing or shortness of breath. 03/19/15   Domenic Moras, PA-C  citalopram (CELEXA) 20 MG tablet Take 20 mg by mouth daily.    Historical Provider, MD  levothyroxine (SYNTHROID, LEVOTHROID) 100 MCG tablet Take 1 tablet (100 mcg total) by mouth daily before breakfast. 09/05/13   Dorie Rank, MD  metFORMIN (GLUCOPHAGE) 1000 MG tablet Take 1,000 mg by mouth 2 (two) times daily with a meal.    Historical Provider, MD     Physical Exam: Filed Vitals:   03/30/15 1845 03/30/15 1922 03/30/15 1942 03/30/15 2105  BP: 122/59   136/53  Pulse: 113  106 106  Temp:    97.9 F (36.6 C)  TempSrc:    Oral  Resp: 18  21 19   Height:    5\' 9"  (1.753 m)  Weight:    169.827 kg (374 lb 6.4 oz)  SpO2: 96% 93% 92% 96%     Constitutional: Vital signs reviewed. Patient is a morbidly obese female who appears be in no acute distress at this time able to talk full sentences. Alert and oriented x3.  Head: Normocephalic and atraumatic  Ear: TM normal bilaterally  Mouth: no erythema or exudates, MMM  Eyes: PERRL, EOMI, conjunctivae normal, No scleral icterus.  Neck: Supple, Trachea midline normal ROM, No JVD, mass, thyromegaly, or carotid bruit present.  Cardiovascular: Tachycardic pulses symmetric and intact bilaterally  Pulmonary/Chest: Positive bilateral wheezes appreciated no rhonchi. Palpation of sternal chest wall reproduces chest pain Abdominal: Soft. Non-tender, non-distended, bowel sounds are normal, no masses, organomegaly, or guarding present.  GU: no CVA tenderness Musculoskeletal: No joint deformities, erythema, or stiffness, ROM full and no nontender Ext: Trace pitting edema of the bilateral lower extremities and no cyanosis, pulses palpable bilaterally (DP and PT)  Hematology: no cervical, inginal, or axillary adenopathy.  Neurological: A&O x3, Strenght is normal and symmetric bilaterally, cranial nerve II-XII are grossly  intact, no focal motor deficit, sensory intact to light touch bilaterally.  Skin: Warm, dry and intact. No rash, cyanosis, or clubbing.  Psychiatric: Normal mood and affect. speech and behavior is normal. Judgment and thought content normal. Cognition and memory are normal.      Data Review   Micro Results No results found for this or any previous visit (from the past 240 hour(s)).  Radiology Reports Dg Chest 2 View  03/30/2015  CLINICAL DATA:  Shortness of breath for 2 weeks. EXAM: CHEST  2 VIEW COMPARISON:  March 19, 2015. FINDINGS: The heart size and mediastinal contours are within normal limits. Both lungs are clear. No pneumothorax or pleural effusion is noted. The visualized skeletal structures are unremarkable. IMPRESSION: No active cardiopulmonary disease. Electronically Signed   By: Marijo Conception, M.D.   On: 03/30/2015 15:31   Dg Chest 2 View  03/19/2015  CLINICAL DATA:  Shortness of breath, wheezing for 1 week. EXAM: CHEST  2 VIEW COMPARISON:  03/23/2013 FINDINGS: The heart size and mediastinal contours are within normal limits. Both lungs are clear. The visualized skeletal structures are unremarkable. IMPRESSION: No active cardiopulmonary disease. Electronically Signed   By: Kathreen Devoid   On: 03/19/2015 14:17     CBC  Recent Labs Lab 03/30/15 1535  WBC 4.4  HGB  12.2  HCT 39.5  PLT 116*  MCV 82.8  MCH 25.6*  MCHC 30.9  RDW 15.6*  LYMPHSABS 0.9  MONOABS 0.4  EOSABS 0.4  BASOSABS 0.0    Chemistries   Recent Labs Lab 03/30/15 1535  NA 140  K 3.3*  CL 104  CO2 25  GLUCOSE 182*  BUN 8  CREATININE 0.71  CALCIUM 8.8*   ------------------------------------------------------------------------------------------------------------------ estimated creatinine clearance is 149.3 mL/min (by C-G formula based on Cr of 0.71). ------------------------------------------------------------------------------------------------------------------ No results for input(s):  HGBA1C in the last 72 hours. ------------------------------------------------------------------------------------------------------------------ No results for input(s): CHOL, HDL, LDLCALC, TRIG, CHOLHDL, LDLDIRECT in the last 72 hours. ------------------------------------------------------------------------------------------------------------------ No results for input(s): TSH, T4TOTAL, T3FREE, THYROIDAB in the last 72 hours.  Invalid input(s): FREET3 ------------------------------------------------------------------------------------------------------------------ No results for input(s): VITAMINB12, FOLATE, FERRITIN, TIBC, IRON, RETICCTPCT in the last 72 hours.  Coagulation profile No results for input(s): INR, PROTIME in the last 168 hours.  No results for input(s): DDIMER in the last 72 hours.  Cardiac Enzymes  Recent Labs Lab 03/30/15 1535  TROPONINI <0.03   ------------------------------------------------------------------------------------------------------------------ Invalid input(s): POCBNP   CBG: No results for input(s): GLUCAP in the last 168 hours.       Assessment/Plan Asthma exacerbation:  patient has a history of seasonal allergies - Admit to MedSurg bed - DuoNeb's schedule every 4 hours for now and Prn every 2 hours as needed - Prednisone 40 mg daily - Mucinex/Tessalon Perles prn  for congestion and cough respectively  Hypoxemia - Continuous pulse oximetry and nasal cannula oxygen as needed to keep O2 sats greater than 92%    Hypothyroidism: Patient reports being unsure of medication doses of levothyroxine - Check TSH   - Verify home levothyroxine dose  Diabetes mellitus type 2, controlled ([place]): Previous hemoglobin A1c 5 months ago noted to be 10. - Check hemoglobin A1c in a.m. - Hold metformin  while hospitalized - CBG checks every before meals and at bedtime with sliding scale of insulin - Consider diabetic education in a.m. depending on  hemoglobin A1c  Thrombocytopenia: Acute. Patient's platelet count 116 on admission slightly better than previous values which showed  platelets around 100.    Hypokalemia - 40 mEq of potassium chloride 1 dose now- - recheck BMP in a.m.  Tachycardia: Patient's heart rates elevated above 100 on admission. Appears to be sinus rhythm. Patient complained of chest pain that was seen to also be reproducible on physical exam likely costochondritis. Initial troponins negative. - Check 12-lead EKG    Code Status:   full Family Communication: bedside Disposition Plan: admit   Total time spent 55 minutes.Greater than 50% of this time was spent in counseling, explanation of diagnosis, planning of further management, and coordination of care  [facility] Pager [phone]  If 7PM-7AM, please contact night-coverage [email or web address] Password [facility] 03/30/2015, 9:58 PM

## 2015-03-31 DIAGNOSIS — E039 Hypothyroidism, unspecified: Secondary | ICD-10-CM

## 2015-03-31 DIAGNOSIS — E118 Type 2 diabetes mellitus with unspecified complications: Secondary | ICD-10-CM

## 2015-03-31 DIAGNOSIS — E876 Hypokalemia: Secondary | ICD-10-CM

## 2015-03-31 DIAGNOSIS — J45901 Unspecified asthma with (acute) exacerbation: Principal | ICD-10-CM

## 2015-03-31 LAB — CBC
HCT: 39.8 % (ref 36.0–46.0)
Hemoglobin: 12.5 g/dL (ref 12.0–15.0)
MCH: 26.6 pg (ref 26.0–34.0)
MCHC: 31.4 g/dL (ref 30.0–36.0)
MCV: 84.7 fL (ref 78.0–100.0)
PLATELETS: 122 10*3/uL — AB (ref 150–400)
RBC: 4.7 MIL/uL (ref 3.87–5.11)
RDW: 15.6 % — ABNORMAL HIGH (ref 11.5–15.5)
WBC: 4.4 10*3/uL (ref 4.0–10.5)

## 2015-03-31 LAB — BASIC METABOLIC PANEL
Anion gap: 12 (ref 5–15)
BUN: 9 mg/dL (ref 6–20)
CALCIUM: 9.8 mg/dL (ref 8.9–10.3)
CO2: 23 mmol/L (ref 22–32)
CREATININE: 0.65 mg/dL (ref 0.44–1.00)
Chloride: 107 mmol/L (ref 101–111)
GFR calc Af Amer: >60 mL/min (ref >60)
GFR calc non Af Amer: >60 mL/min (ref >60)
GLUCOSE: 323 mg/dL — AB (ref 65–99)
Potassium: 4.3 mmol/L (ref 3.5–5.1)
Sodium: 142 mmol/L (ref 135–145)

## 2015-03-31 LAB — GLUCOSE, CAPILLARY
GLUCOSE-CAPILLARY: 237 mg/dL — AB (ref 65–99)
GLUCOSE-CAPILLARY: 267 mg/dL — AB (ref 65–99)
GLUCOSE-CAPILLARY: 352 mg/dL — AB (ref 65–99)
GLUCOSE-CAPILLARY: 330 mg/dL — AB (ref 65–99)

## 2015-03-31 LAB — TSH: TSH: 0.074 u[IU]/mL — ABNORMAL LOW (ref 0.350–4.500)

## 2015-03-31 MED ORDER — METHYLPREDNISOLONE SODIUM SUCC 125 MG IJ SOLR: 80.0000 mg | Freq: Three times a day (TID) | INTRAMUSCULAR | Status: DC

## 2015-03-31 MED ORDER — IPRATROPIUM-ALBUTEROL 0.5-2.5 (3) MG/3ML IN SOLN
3.0000 mL | Freq: Three times a day (TID) | RESPIRATORY_TRACT | Status: DC
  Administered 2015-03-31 – 2015-04-01 (×3): 3 mL via RESPIRATORY_TRACT
  Filled 2015-03-31 (×3): qty 3

## 2015-03-31 MED ORDER — CITALOPRAM HYDROBROMIDE 20 MG PO TABS
20.0000 mg | ORAL_TABLET | Freq: Once | ORAL | Status: AC
  Administered 2015-03-31: 20 mg via ORAL
  Filled 2015-03-31: qty 1

## 2015-03-31 MED ORDER — METFORMIN HCL 500 MG PO TABS
1000.0000 mg | ORAL_TABLET | Freq: Every day | ORAL | Status: DC
  Administered 2015-03-31 – 2015-04-01 (×2): 1000 mg via ORAL
  Filled 2015-03-31 (×2): qty 2

## 2015-03-31 MED ORDER — ENOXAPARIN SODIUM 80 MG/0.8ML ~~LOC~~ SOLN
80.0000 mg | SUBCUTANEOUS | Status: DC
  Administered 2015-03-31 – 2015-04-01 (×2): 80 mg via SUBCUTANEOUS
  Filled 2015-03-31 (×2): qty 0.8

## 2015-03-31 MED ORDER — INSULIN ASPART 100 UNIT/ML ~~LOC~~ SOLN
0.0000 [IU] | Freq: Three times a day (TID) | SUBCUTANEOUS | Status: DC
  Administered 2015-03-31 – 2015-04-01 (×2): 15 [IU] via SUBCUTANEOUS
  Administered 2015-04-01: 8 [IU] via SUBCUTANEOUS

## 2015-03-31 MED ORDER — METHYLPREDNISOLONE SODIUM SUCC 125 MG IJ SOLR
60.0000 mg | Freq: Three times a day (TID) | INTRAMUSCULAR | Status: DC
  Administered 2015-03-31 – 2015-04-01 (×4): 60 mg via INTRAVENOUS
  Filled 2015-03-31 (×4): qty 2

## 2015-03-31 MED ORDER — CITALOPRAM HYDROBROMIDE 20 MG PO TABS
40.0000 mg | ORAL_TABLET | Freq: Every day | ORAL | Status: DC
  Administered 2015-04-01 – 2015-04-02 (×2): 40 mg via ORAL
  Filled 2015-03-31 (×2): qty 2

## 2015-03-31 MED ORDER — LEVOTHYROXINE SODIUM 50 MCG PO TABS
250.0000 ug | ORAL_TABLET | Freq: Every day | ORAL | Status: DC
  Administered 2015-03-31 – 2015-04-02 (×3): 250 ug via ORAL
  Filled 2015-03-31 (×3): qty 1

## 2015-03-31 NOTE — Progress Notes (Signed)
Patient Demographics  Janet [name], is a 45 y.o. female, DOB - 1970-08-27, [id number]  Admit date - 03/30/2015   Admitting Physician Norval Morton, MD  Outpatient Primary MD for the patient is No PCP Per Patient  LOS - 1   Chief Complaint  Patient presents with  . Shortness of Breath        Subjective:   Janet [name] today has, No headache, No chest pain, No abdominal pain - No Nausea, reports dry cough, nonproductive, reports dyspnea is improving . Assessment & Plan    Principal Problem:   Asthma exacerbation Active Problems:   Hypoxemia   Hypothyroidism   Diabetes mellitus type 2, controlled (HCC)   Thrombocytopenia (HCC)   Hypokalemia   acute hypoxic respiratory failure secondary to asthma exacerbation - Still requiring 2-3 oxygen via nasal cannula  - Wheezing improved this a.m., will continue high with IV steroids today, oblique and transitioned to by mouth in a.m. Marland Kitchen - Continue with nebs as needed   Hypothyroidism  - cont with Synthroid   Diabetes mellitus  -  uncontrolled, most likely secondary to steroids, will change SSI from sensitive to moderate, will resume back on metformin, follow on hemoglobin A1c   hypokalemia  - Repleted    Code Status: Full   Family Communication: discussed with patient   Disposition Plan: home in 24-48 hours   Procedures  None    Consults   None    Medications  Scheduled Meds: . budesonide (PULMICORT) nebulizer solution  0.5 mg Nebulization BID  . [START ON 04/01/2015] citalopram  40 mg Oral Daily  . enoxaparin (LOVENOX) injection  80 mg Subcutaneous Q24H  . guaiFENesin  600 mg Oral BID  . insulin aspart  0-5 Units Subcutaneous QHS  . insulin aspart  0-9 Units Subcutaneous TID WC  . ipratropium-albuterol  3 mL Nebulization TID  . levothyroxine  250 mcg Oral QAC breakfast  . metFORMIN  1,000 mg Oral QHS  .  methylPREDNISolone (SOLU-MEDROL) injection  60 mg Intravenous 3 times per day   Continuous Infusions:  PRN Meds:.acetaminophen **OR** acetaminophen, albuterol, benzonatate, HYDROcodone-acetaminophen, morphine injection, ondansetron **OR** ondansetron (ZOFRAN) IV  DVT Prophylaxis  Lovenox   Lab Results  Component Value Date   PLT 122* 03/31/2015    Antibiotics    Anti-infectives    Start     Dose/Rate Route Frequency Ordered Stop   03/30/15 1715  cefTRIAXone (ROCEPHIN) 1 g in dextrose 5 % 50 mL IVPB     1 g 100 mL/hr over 30 Minutes Intravenous  Once 03/30/15 1712 03/30/15 1859   03/30/15 1715  azithromycin (ZITHROMAX) tablet 500 mg     500 mg Oral  Once 03/30/15 1712 03/30/15 1725          Objective:   Filed Vitals:   03/31/15 0743 03/31/15 1203 03/31/15 1307 03/31/15 1342  BP:    151/51  Pulse:    101  Temp:    97.7 F (36.5 C)  TempSrc:    Oral  Resp:    18  Height:      Weight:      SpO2: 94% 93% 94% 95%    Wt Readings from Last 3 Encounters:  03/30/15 169.827 kg (374 lb  6.4 oz)  03/19/15 176.903 kg (390 lb)  09/05/13 170.099 kg (375 lb)     Intake/Output Summary (Last 24 hours) at 03/31/15 1548 Last data filed at 03/31/15 1022  Gross per 24 hour  Intake   1090 ml  Output      0 ml  Net   1090 ml     Physical Exam  Awake Alert, Oriented X 3, No new F.N deficits, Normal affect [place].AT,PERRAL Supple Neck,No JVD, No cervical lymphadenopathy appriciated.  Symmetrical Chest wall movement, Good air movement bilaterally, scattered wheezing  RRR,No Gallops,Rubs or new Murmurs, No Parasternal Heave +ve B.Sounds, Abd Soft, No tenderness, No organomegaly appriciated, No rebound - guarding or rigidity. No Cyanosis, Clubbing or edema, No new Rash or bruise    Data Review   Micro Results No results found for this or any previous visit (from the past 240 hour(s)).  Radiology Reports Dg Chest 2 View  03/30/2015  CLINICAL DATA:  Shortness of breath for 2  weeks. EXAM: CHEST  2 VIEW COMPARISON:  March 19, 2015. FINDINGS: The heart size and mediastinal contours are within normal limits. Both lungs are clear. No pneumothorax or pleural effusion is noted. The visualized skeletal structures are unremarkable. IMPRESSION: No active cardiopulmonary disease. Electronically Signed   By: Marijo Conception, M.D.   On: 03/30/2015 15:31   Dg Chest 2 View  03/19/2015  CLINICAL DATA:  Shortness of breath, wheezing for 1 week. EXAM: CHEST  2 VIEW COMPARISON:  03/23/2013 FINDINGS: The heart size and mediastinal contours are within normal limits. Both lungs are clear. The visualized skeletal structures are unremarkable. IMPRESSION: No active cardiopulmonary disease. Electronically Signed   By: Kathreen Devoid   On: 03/19/2015 14:17     CBC  Recent Labs Lab 03/30/15 1535 03/31/15 0340  WBC 4.4 4.4  HGB 12.2 12.5  HCT 39.5 39.8  PLT 116* 122*  MCV 82.8 84.7  MCH 25.6* 26.6  MCHC 30.9 31.4  RDW 15.6* 15.6*  LYMPHSABS 0.9  --   MONOABS 0.4  --   EOSABS 0.4  --   BASOSABS 0.0  --     Chemistries   Recent Labs Lab 03/30/15 1535 03/31/15 0340  NA 140 142  K 3.3* 4.3  CL 104 107  CO2 25 23  GLUCOSE 182* 323*  BUN 8 9  CREATININE 0.71 0.65  CALCIUM 8.8* 9.8   ------------------------------------------------------------------------------------------------------------------ estimated creatinine clearance is 149.3 mL/min (by C-G formula based on Cr of 0.65). ------------------------------------------------------------------------------------------------------------------ No results for input(s): HGBA1C in the last 72 hours. ------------------------------------------------------------------------------------------------------------------ No results for input(s): CHOL, HDL, LDLCALC, TRIG, CHOLHDL, LDLDIRECT in the last 72 hours. ------------------------------------------------------------------------------------------------------------------  Recent  Labs  03/31/15 0340  TSH 0.074*   ------------------------------------------------------------------------------------------------------------------ No results for input(s): VITAMINB12, FOLATE, FERRITIN, TIBC, IRON, RETICCTPCT in the last 72 hours.  Coagulation profile No results for input(s): INR, PROTIME in the last 168 hours.  No results for input(s): DDIMER in the last 72 hours.  Cardiac Enzymes  Recent Labs Lab 03/30/15 1535  TROPONINI <0.03   ------------------------------------------------------------------------------------------------------------------ Invalid input(s): POCBNP     Time Spent in minutes   25 minutes   Mandisa Persinger M.D on 03/31/2015 at 3:48 PM  Between 7am to 7pm - Pager - [phone]  After 7pm go to [email or web address] - password [facility] [place]  [facility]   Office  [phone]

## 2015-03-31 NOTE — Progress Notes (Signed)
Patient reports she moved to the area about 4 weeks ago, and previously used nocturnal CPAP with 2.5 liters of oxygen. She states oxygen use was only at night with her CPAP and she was not able to bring her oxygen with her when she moved. She did, however, bring the CPAP in the move, but has been noncompliant with its use. She explains that she has not had the opportunity to find a PCP in the area to restart the night time oxygen use and a new sleep study. She is currently on 2 liters with stable VS. RN made aware.

## 2015-04-01 LAB — GLUCOSE, CAPILLARY
GLUCOSE-CAPILLARY: 274 mg/dL — AB (ref 65–99)
GLUCOSE-CAPILLARY: 331 mg/dL — AB (ref 65–99)
GLUCOSE-CAPILLARY: 322 mg/dL — AB (ref 65–99)
Glucose-Capillary: 353 mg/dL — ABNORMAL HIGH (ref 65–99)

## 2015-04-01 LAB — HEMOGLOBIN A1C
Hgb A1c MFr Bld: 8.7 % — ABNORMAL HIGH (ref 4.8–5.6)
Mean Plasma Glucose: 203 mg/dL

## 2015-04-01 MED ORDER — IPRATROPIUM-ALBUTEROL 0.5-2.5 (3) MG/3ML IN SOLN
3.0000 mL | Freq: Two times a day (BID) | RESPIRATORY_TRACT | Status: DC
  Administered 2015-04-01 – 2015-04-02 (×2): 3 mL via RESPIRATORY_TRACT
  Filled 2015-04-01 (×2): qty 3

## 2015-04-01 MED ORDER — INSULIN ASPART 100 UNIT/ML ~~LOC~~ SOLN
0.0000 [IU] | Freq: Three times a day (TID) | SUBCUTANEOUS | Status: DC
Start: 2015-04-01 — End: 2015-04-02
  Administered 2015-04-01: 15 [IU] via SUBCUTANEOUS
  Administered 2015-04-02 (×2): 11 [IU] via SUBCUTANEOUS

## 2015-04-01 MED ORDER — INSULIN GLARGINE 100 UNIT/ML ~~LOC~~ SOLN
8.0000 [IU] | Freq: Every day | SUBCUTANEOUS | Status: DC
  Administered 2015-04-01 – 2015-04-02 (×2): 8 [IU] via SUBCUTANEOUS
  Filled 2015-04-01 (×2): qty 0.08

## 2015-04-01 MED ORDER — NYSTATIN 100000 UNIT/GM EX POWD
Freq: Three times a day (TID) | CUTANEOUS | Status: DC
  Administered 2015-04-01: 15:00:00 via TOPICAL
  Administered 2015-04-01: 1 g via TOPICAL
  Administered 2015-04-02: 09:00:00 via TOPICAL
  Filled 2015-04-01: qty 15

## 2015-04-01 MED ORDER — ARFORMOTEROL TARTRATE 15 MCG/2ML IN NEBU
15.0000 ug | INHALATION_SOLUTION | Freq: Two times a day (BID) | RESPIRATORY_TRACT | Status: DC
  Administered 2015-04-02: 15 ug via RESPIRATORY_TRACT
  Filled 2015-04-01 (×4): qty 2

## 2015-04-01 MED ORDER — METHYLPREDNISOLONE SODIUM SUCC 125 MG IJ SOLR
60.0000 mg | Freq: Two times a day (BID) | INTRAMUSCULAR | Status: DC
  Administered 2015-04-01: 60 mg via INTRAVENOUS
  Filled 2015-04-01: qty 2

## 2015-04-01 NOTE — Progress Notes (Signed)
Patient Demographics  Janet [name], is a 45 y.o. female, DOB - [birth year], [id number]  Admit date - 03/30/2015   Admitting Physician Norval Morton, MD  Outpatient Primary MD for the patient is No PCP Per Patient  LOS - 2   Chief Complaint  Patient presents with  . Shortness of Breath        Subjective:   Janet [name] today has, No headache, No chest pain, No abdominal pain - No Nausea, reports dry cough, nonproductive, reports dyspnea is improving . Assessment & Plan    Principal Problem:   Asthma exacerbation Active Problems:   Hypoxemia   Hypothyroidism   Diabetes mellitus type 2, controlled (HCC)   Thrombocytopenia (HCC)   Hypokalemia   acute hypoxic respiratory failure secondary to asthma exacerbation - Improving, secondary to asthma exacerbation   Asthma exacerbation - Started steroid taper yesterday, patient with significant wheezing this a.m., so will hold on further IV Solu-Medrol taper for today , will start on Brovana nebulized, Pulmicort nebulized, continue with when necessary albuterol .3 gases in a.m. to see if possible to transition to by mouth steroids .  Hypothyroidism  - cont with Synthroid   Diabetes mellitus  -  uncontrolled, most likely secondary to steroids, already on home dose metformin, will change her SSI from moderate to resistant, will start on low dose Lantus,  hemoglobin A1c is 8.7   hypokalemia  - Repleted    Code Status: Full   Family Communication: discussed with patient   Disposition Plan: home in 24-48 hours   Procedures  None    Consults   None    Medications  Scheduled Meds: . arformoterol  15 mcg Nebulization BID  . budesonide (PULMICORT) nebulizer solution  0.5 mg Nebulization BID  . citalopram  40 mg Oral Daily  . enoxaparin (LOVENOX) injection  80 mg Subcutaneous Q24H  . guaiFENesin  600 mg Oral BID  .  insulin aspart  0-15 Units Subcutaneous TID WC  . insulin aspart  0-5 Units Subcutaneous QHS  . insulin glargine  8 Units Subcutaneous Daily  . ipratropium-albuterol  3 mL Nebulization BID  . levothyroxine  250 mcg Oral QAC breakfast  . metFORMIN  1,000 mg Oral QHS  . methylPREDNISolone (SOLU-MEDROL) injection  60 mg Intravenous Q12H  . nystatin   Topical TID   Continuous Infusions:  PRN Meds:.acetaminophen **OR** acetaminophen, albuterol, benzonatate, HYDROcodone-acetaminophen, morphine injection, ondansetron **OR** ondansetron (ZOFRAN) IV  DVT Prophylaxis  Lovenox   Lab Results  Component Value Date   PLT 122* 03/31/2015    Antibiotics    Anti-infectives    Start     Dose/Rate Route Frequency Ordered Stop   03/30/15 1715  cefTRIAXone (ROCEPHIN) 1 g in dextrose 5 % 50 mL IVPB     1 g 100 mL/hr over 30 Minutes Intravenous  Once 03/30/15 1712 03/30/15 1859   03/30/15 1715  azithromycin (ZITHROMAX) tablet 500 mg     500 mg Oral  Once 03/30/15 1712 03/30/15 1725          Objective:   Filed Vitals:   03/31/15 2146 04/01/15 0600 04/01/15 0841 04/01/15 1005  BP: 153/58 145/63    Pulse: 97 85 76   Temp:  98.1 F (36.7  C)    TempSrc: Oral Oral    Resp: 18 18 18    Height:      Weight:      SpO2: 95% 92% 90% 95%    Wt Readings from Last 3 Encounters:  03/30/15 169.827 kg (374 lb 6.4 oz)  03/19/15 176.903 kg (390 lb)  09/05/13 170.099 kg (375 lb)     Intake/Output Summary (Last 24 hours) at 04/01/15 1338 Last data filed at 04/01/15 0900  Gross per 24 hour  Intake    480 ml  Output      0 ml  Net    480 ml     Physical Exam  Awake Alert, Oriented X 3, No new F.N deficits, Normal affect [place].AT,PERRAL Supple Neck,No JVD, No cervical lymphadenopathy appriciated.  Symmetrical Chest wall movement, decreased air movement bilaterally, diffuse bilateral wheezing RRR,No Gallops,Rubs or new Murmurs, No Parasternal Heave +ve B.Sounds, Abd Soft, No tenderness, No  organomegaly appriciated, No rebound - guarding or rigidity. No Cyanosis, Clubbing or edema, No new Rash or bruise    Data Review   Micro Results No results found for this or any previous visit (from the past 240 hour(s)).  Radiology Reports Dg Chest 2 View  03/30/2015  CLINICAL DATA:  Shortness of breath for 2 weeks. EXAM: CHEST  2 VIEW COMPARISON:  March 19, 2015. FINDINGS: The heart size and mediastinal contours are within normal limits. Both lungs are clear. No pneumothorax or pleural effusion is noted. The visualized skeletal structures are unremarkable. IMPRESSION: No active cardiopulmonary disease. Electronically Signed   By: Marijo Conception, M.D.   On: 03/30/2015 15:31   Dg Chest 2 View  03/19/2015  CLINICAL DATA:  Shortness of breath, wheezing for 1 week. EXAM: CHEST  2 VIEW COMPARISON:  03/23/2013 FINDINGS: The heart size and mediastinal contours are within normal limits. Both lungs are clear. The visualized skeletal structures are unremarkable. IMPRESSION: No active cardiopulmonary disease. Electronically Signed   By: Kathreen Devoid   On: 03/19/2015 14:17     CBC  Recent Labs Lab 03/30/15 1535 03/31/15 0340  WBC 4.4 4.4  HGB 12.2 12.5  HCT 39.5 39.8  PLT 116* 122*  MCV 82.8 84.7  MCH 25.6* 26.6  MCHC 30.9 31.4  RDW 15.6* 15.6*  LYMPHSABS 0.9  --   MONOABS 0.4  --   EOSABS 0.4  --   BASOSABS 0.0  --     Chemistries   Recent Labs Lab 03/30/15 1535 03/31/15 0340  NA 140 142  K 3.3* 4.3  CL 104 107  CO2 25 23  GLUCOSE 182* 323*  BUN 8 9  CREATININE 0.71 0.65  CALCIUM 8.8* 9.8   ------------------------------------------------------------------------------------------------------------------ estimated creatinine clearance is 149.3 mL/min (by C-G formula based on Cr of 0.65). ------------------------------------------------------------------------------------------------------------------  Recent Labs  03/31/15 0340  HGBA1C 8.7*    ------------------------------------------------------------------------------------------------------------------ No results for input(s): CHOL, HDL, LDLCALC, TRIG, CHOLHDL, LDLDIRECT in the last 72 hours. ------------------------------------------------------------------------------------------------------------------  Recent Labs  03/31/15 0340  TSH 0.074*   ------------------------------------------------------------------------------------------------------------------ No results for input(s): VITAMINB12, FOLATE, FERRITIN, TIBC, IRON, RETICCTPCT in the last 72 hours.  Coagulation profile No results for input(s): INR, PROTIME in the last 168 hours.  No results for input(s): DDIMER in the last 72 hours.  Cardiac Enzymes  Recent Labs Lab 03/30/15 1535  TROPONINI <0.03   ------------------------------------------------------------------------------------------------------------------ Invalid input(s): POCBNP     Time Spent in minutes   25 minutes   Lorik Guo M.D on 04/01/2015 at 1:38 PM  Between 7am to 7pm - Pager - [phone]  After 7pm go to [email or web address] - password [facility]  [facility]   Office  [phone]

## 2015-04-02 LAB — EXPECTORATED SPUTUM ASSESSMENT W REFEX TO RESP CULTURE

## 2015-04-02 LAB — GLUCOSE, CAPILLARY
GLUCOSE-CAPILLARY: 274 mg/dL — AB (ref 65–99)
Glucose-Capillary: 283 mg/dL — ABNORMAL HIGH (ref 65–99)

## 2015-04-02 LAB — EXPECTORATED SPUTUM ASSESSMENT W GRAM STAIN, RFLX TO RESP C

## 2015-04-02 MED ORDER — METFORMIN HCL 500 MG PO TABS
1000.0000 mg | ORAL_TABLET | Freq: Two times a day (BID) | ORAL | Status: DC
  Administered 2015-04-02: 1000 mg via ORAL
  Filled 2015-04-02: qty 2

## 2015-04-02 MED ORDER — CITALOPRAM HYDROBROMIDE 20 MG PO TABS: 40.0000 mg | ORAL_TABLET | Freq: Every day | ORAL | Status: DC

## 2015-04-02 MED ORDER — LEVOTHYROXINE SODIUM 125 MCG PO TABS: 250.0000 ug | ORAL_TABLET | Freq: Every day | ORAL | Status: DC

## 2015-04-02 MED ORDER — METFORMIN HCL 1000 MG PO TABS: 1000.0000 mg | ORAL_TABLET | Freq: Two times a day (BID) | ORAL | Status: DC

## 2015-04-02 MED ORDER — GUAIFENESIN ER 600 MG PO TB12: 600.0000 mg | ORAL_TABLET | Freq: Two times a day (BID) | ORAL | Status: DC

## 2015-04-02 MED ORDER — NYSTATIN 100000 UNIT/GM EX POWD: CUTANEOUS | Status: DC

## 2015-04-02 MED ORDER — PREDNISONE 50 MG PO TABS
50.0000 mg | ORAL_TABLET | Freq: Every day | ORAL | Status: DC
  Administered 2015-04-02: 50 mg via ORAL
  Filled 2015-04-02: qty 1

## 2015-04-02 MED ORDER — FLUCONAZOLE 100 MG PO TABS: 100.0000 mg | ORAL_TABLET | Freq: Every day | ORAL | Status: DC

## 2015-04-02 MED ORDER — ALBUTEROL SULFATE (2.5 MG/3ML) 0.083% IN NEBU: 2.5000 mg | INHALATION_SOLUTION | Freq: Four times a day (QID) | RESPIRATORY_TRACT | Status: DC | PRN

## 2015-04-02 MED ORDER — FLUCONAZOLE 100 MG PO TABS
200.0000 mg | ORAL_TABLET | Freq: Every day | ORAL | Status: DC
  Administered 2015-04-02: 200 mg via ORAL
  Filled 2015-04-02: qty 2

## 2015-04-02 MED ORDER — PREDNISONE 10 MG PO TABS: ORAL_TABLET | ORAL | Status: DC

## 2015-04-02 NOTE — Care Management Note (Addendum)
Case Management Note  Patient Details  Name: Janet [name] MRN: [id number] Date of Birth: Jul 01, 1970  Subjective/Objective:     Asthma exacerbation               Action/Plan: NCM spoke to pt. 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 Contacted [facility] DME rep for nebulizer machine for home. States she still has her [place]. Explained she will need to have to go through the [employer] Medicaid process to qualify for Medicaid in [place]. Provided pt with brochure for [facility]. Sent message to [facility] RN liaison to arrange hospital follow up appt. Pt states she can pay for medications if are not expensive. Waiting for medication list.   04/02/2015 1600 Waiting for neb machine. Provided pt with [place] letter to pick up meds for $3.00 copay. Explained she can use program once per year. States she will not receive her first check from her new job until this week. Pt plans to apply for [place] Medicaid. She has one small child in the home. Will fax information about [facility] card to admissions.   Expected Discharge Date:  04/02/2015              Expected Discharge Plan:  Home/Self Care  In-House Referral:  NA  Discharge planning Services  CM Consult, Medication Assistance, [facility]  Post Acute Care Choice:  NA Choice offered to:  NA  DME Arranged:  N/A DME Agency:  NA  HH Arranged:  NA HH Agency:  NA  Status of Service:  Completed, signed off  Medicare Important Message Given:    Date Medicare IM Given:    Medicare IM give by:    Date Additional Medicare IM Given:    Additional Medicare Important Message give by:     If discussed at [place] of Stay Meetings, dates discussed:    Additional Comments:  Erenest Rasher, RN 04/02/2015, 1:08 PM

## 2015-04-02 NOTE — Discharge Instructions (Signed)
Follow with [facility] in 1-2 weeks - please keep your appointment with pulmonary on 04/20/2015  Get CBC, CMP, 2 view Chest X ray checked  by Primary MD next visit.    Activity: As tolerated with Full fall precautions use walker/cane & assistance as needed   Disposition Home    Diet: Heart Healthy , carbohydrate modified , with feeding assistance and aspiration precautions.  For Heart failure patients - Check your Weight same time everyday, if you gain over 2 pounds, or you develop in leg swelling, experience more shortness of breath or chest pain, call your Primary MD immediately. Follow Cardiac Low Salt Diet and 1.5 lit/day fluid restriction.   On your next visit with your primary care physician please Get Medicines reviewed and adjusted.   Please request your Prim.MD to go over all Hospital Tests and Procedure/Radiological results at the follow up, please get all Hospital records sent to your Prim MD by signing hospital release before you go home.   If you experience worsening of your admission symptoms, develop shortness of breath, life threatening emergency, suicidal or homicidal thoughts you must seek medical attention immediately by calling 911 or calling your MD immediately  if symptoms less severe.  You Must read complete instructions/literature along with all the possible adverse reactions/side effects for all the Medicines you take and that have been prescribed to you. Take any new Medicines after you have completely understood and accpet all the possible adverse reactions/side effects.   Do not drive, operating heavy machinery, perform activities at heights, swimming or participation in water activities or provide baby sitting services if your were admitted for syncope or siezures until you have seen by Primary MD or a Neurologist and advised to do so again.  Do not drive when taking Pain medications.    Do not take more than prescribed Pain, Sleep and Anxiety  Medications  Special Instructions: If you have smoked or chewed Tobacco  in the last 2 yrs please stop smoking, stop any regular Alcohol  and or any Recreational drug use.  Wear Seat belts while driving.   Please note  You were cared for by a hospitalist during your hospital stay. If you have any questions about your discharge medications or the care you received while you were in the hospital after you are discharged, you can call the unit and asked to speak with the hospitalist on call if the hospitalist that took care of you is not available. Once you are discharged, your primary care physician will handle any further medical issues. Please note that NO REFILLS for any discharge medications will be authorized once you are discharged, as it is imperative that you return to your primary care physician (or establish a relationship with a primary care physician if you do not have one) for your aftercare needs so that they can reassess your need for medications and monitor your lab values.

## 2015-04-02 NOTE — Discharge Summary (Addendum)
Janet [name], is a 45 y.o. female  DOB [birth year]  MRN [id number].  Admission date:  03/30/2015  Admitting Physician  Norval Morton, MD  Discharge Date:  04/02/2015   Primary MD  No PCP Per Patient  Recommendations for primary care physician for things to follow:  - patient instructed to follow with: Wellness clinic in 1-2 weeks - Instructed to keep her appointment with pulmonary clinic in 04/20/2015   Admission Diagnosis  RAD (reactive airway disease) with wheezing, moderate persistent, with acute exacerbation [J45.41]   Discharge Diagnosis  RAD (reactive airway disease) with wheezing, moderate persistent, with acute exacerbation [J45.41]    Principal Problem:   Asthma exacerbation Active Problems:   Hypoxemia   Hypothyroidism   Diabetes mellitus type 2, controlled ([place])   Thrombocytopenia ([place])   Hypokalemia      Past Medical History  Diagnosis Date  . Thyroid disease   . Depression   . Asthma     Past Surgical History  Procedure Laterality Date  . Carpal tunnel release    . Cesarean section    . Sinus exploration    . Dilation and curettage of uterus         History of present illness and  Hospital Course:     Kindly see H&P for history of present illness and admission details, please review complete Labs, Consult reports and Test reports for all details in brief  HPI  from the history and physical done on the day of admission 03/30/2015  Janet [name] is a 45 year old female with a past medical history significant for hypothyroidism, diabetes mellitus type 2, morbid obesity, and asthma; who presented to [facility] with complaints of shortness of breath and cough. States symptoms initially started around the earlier part of this month after moving here from [place]. She reports having to utilize her inhaler 2-3 times daily. At that time she had congestion and  postnasal drip and a nonproductive cough. Evaluated in the emergency department on 2/5 and was given prednisone, Tessalon Perles, albuterol inhaler and discharged home. Patient felt better the initial first 2 days thereafter symptoms of progressively returned of sneezing, dry cough, wheezing, nasal congestion, and sinus pressure. Evaluated again and given amoxicillin for possible sinus infection which she reports taking the last dose of the day before yesterday. This helped improve her sinus congestion and relieved her sinus pressure complaints. During this time however breathing symptoms had progressively worsened. She reports utilizing her inhaler 2-3 times per day, and the cold been out of her sleep at least 3-4 times a night coughing and/or short of breath. This morning she reports losing her breath just getting out of bed. Patient reports having a problem with the pulmonologist scheduled for March night. She's been also utilizing generic Claritin without relief of symptoms. 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 Denies any fever, chills, tobacco abuse, or diarrhea. Endorses seasonal allergies. Patient notes that she  is unsure of dosage of her level of thyroxine and previously was told that her last hemoglobin A1c was 10 months ago. She states that her fasting blood glucoses are usually less than 160.  Upon evaluation at the emergency department chest x-ray showed no acute abnormalities. Given a continuous albuterol treatment with 125 mg of methylprednisolone. Patient was initially started on azithromycin and Rocephin for question of failed outpatient treatment.  Hospital Course  acute hypoxic respiratory failure secondary to asthma exacerbation - secondary to asthma exacerbation, resolved, currently on room air.  Asthma exacerbation - with significant wheezing on admission, on IV steroids, wheezing significantly improved today,  will transition to by mouth prednisone,well discharge home on prednisone taper, discussed with case management, who will arrange for home nebulizer machine, we'll discharge on when necessary albuterol, this is patient's first admission for asthma exacerbation, at this point didn't see any indication to start long acting medication.  Hypothyroidism  - cont with Synthroid   Diabetes mellitus  - uncontrolled, most likely secondary to steroids, hemoglobin A1c is 8.7,we'll decrease her metformin from 1 g once daily to twice a day.  hypokalemia  - Repleted   Fungal rash in abdominal fold area - we'll treat with Diflucan and nystatin powder   Discharge Condition: stable   Follow UP  Follow-up Information    Follow up with [facility].   Why:  please contact to follow up on appointment   Contact information:   [address] [place] [facility] [phone]        Discharge Instructions  and  Discharge Medications     Discharge Instructions    Discharge instructions    Complete by:  As directed   Follow with [facility]  Wellness clinic in 1-2 weeks - please keep your appointment with pulmonary on 04/20/2015  Get CBC, CMP, 2 view Chest X ray checked  by Primary MD next visit.    Activity: As tolerated with Full fall precautions use walker/cane & assistance as needed   Disposition Home    Diet: Heart Healthy , carbohydrate modified , with feeding assistance and aspiration precautions.  For Heart failure patients - Check your Weight same time everyday, if you gain over 2 pounds, or you develop in leg swelling, experience more shortness of breath or chest pain, call your Primary MD immediately. Follow Cardiac Low Salt Diet and 1.5 lit/day fluid restriction.   On your next visit with your primary care physician please Get Medicines reviewed and adjusted.   Please request your Prim.MD to go over all Hospital Tests and  Procedure/Radiological results at the follow up, please get all Hospital records sent to your Prim MD by signing hospital release before you go home.   If you experience worsening of your admission symptoms, develop shortness of breath, life threatening emergency, suicidal or homicidal thoughts you must seek medical attention immediately by calling 911 or calling your MD immediately  if symptoms less severe.  You Must read complete instructions/literature along with all the possible adverse reactions/side effects for all the Medicines you take and that have been prescribed to you. Take any new Medicines after you have completely understood and accpet all the possible adverse reactions/side effects.   Do not drive, operating heavy machinery, perform activities at heights, swimming or participation in water activities or provide baby sitting services if your were admitted for syncope or siezures until you have seen by Primary MD or a Neurologist and advised to do  so again.  Do not drive when taking Pain medications.    Do not take more than prescribed Pain, Sleep and Anxiety Medications  Special Instructions: If you have smoked or chewed Tobacco  in the last 2 yrs please stop smoking, stop any regular Alcohol  and or any Recreational drug use.  Wear Seat belts while driving.   Please note  You were cared for by a hospitalist during your hospital stay. If you have any questions about your discharge medications or the care you received while you were in the hospital after you are discharged, you can call the unit and asked to speak with the hospitalist on call if the hospitalist that took care of you is not available. Once you are discharged, your primary care physician will handle any further medical issues. Please note that NO REFILLS for any discharge medications will be authorized once you are discharged, as it is imperative that you return to your primary care physician (or establish a  relationship with a primary care physician if you do not have one) for your aftercare needs so that they can reassess your need for medications and monitor your lab values.     Increase activity slowly    Complete by:  As directed             Medication List    TAKE these medications        albuterol 108 (90 Base) MCG/ACT inhaler  Commonly known as:  PROVENTIL HFA;VENTOLIN HFA  Inhale 1-2 puffs into the lungs every 6 (six) hours as needed for wheezing or shortness of breath.     albuterol (2.5 MG/3ML) 0.083% nebulizer solution  Commonly known as:  PROVENTIL  Take 3 mLs (2.5 mg total) by nebulization every 6 (six) hours as needed for wheezing or shortness of breath.     citalopram 20 MG tablet  Commonly known as:  CELEXA  Take 40 mg by mouth daily.     guaiFENesin 600 MG 12 hr tablet  Commonly known as:  MUCINEX  Take 1 tablet (600 mg total) by mouth 2 (two) times daily.     levothyroxine 125 MCG tablet  Commonly known as:  SYNTHROID, LEVOTHROID  Take 250 mcg by mouth daily before breakfast.     metFORMIN 1000 MG tablet  Commonly known as:  GLUCOPHAGE  Take 1 tablet (1,000 mg total) by mouth 2 (two) times daily with a meal.     nystatin 100000 UNIT/GM Powd  Apply topically to affected area 2 times daily     predniSONE 10 MG tablet  Commonly known as:  DELTASONE  Please take 4 tablets oral daily for 3 days, then  3 tablets oral daily for 3 days, then 2 tablets oral daily for 3 days, then 1 tablet oral daily for  days then stop          Diet and Activity recommendation: See Discharge Instructions above   Consults obtained -  none   Major procedures and Radiology Reports - PLEASE review detailed and final reports for all details, in brief -      Dg Chest 2 View  03/30/2015  CLINICAL DATA:  Shortness of breath for 2 weeks. EXAM: CHEST  2 VIEW COMPARISON:  March 19, 2015. FINDINGS: The heart size and mediastinal contours are within normal limits. Both lungs  are clear. No pneumothorax or pleural effusion is noted. The visualized skeletal structures are unremarkable. IMPRESSION: No active cardiopulmonary disease. Electronically Signed   By: Jeneen Rinks  Murlean Caller, M.D.   On: 03/30/2015 15:31   Dg Chest 2 View  03/19/2015  CLINICAL DATA:  Shortness of breath, wheezing for 1 week. EXAM: CHEST  2 VIEW COMPARISON:  03/23/2013 FINDINGS: The heart size and mediastinal contours are within normal limits. Both lungs are clear. The visualized skeletal structures are unremarkable. IMPRESSION: No active cardiopulmonary disease. Electronically Signed   By: Kathreen Devoid   On: 03/19/2015 14:17    Micro Results     No results found for this or any previous visit (from the past 240 hour(s)).     Today   Subjective:   Tiwanda Claveria today has no headache,no chest abdominal pain,no new weakness tingling or numbness, feels much better wants to go home today.   Objective:   Blood pressure 150/78, pulse 75, temperature 98 F (36.7 C), temperature source Oral, resp. rate 18, height 5\' 9"  (1.753 m), weight 169.827 kg (374 lb 6.4 oz), SpO2 95 %.   Intake/Output Summary (Last 24 hours) at 04/02/15 1331 Last data filed at 04/02/15 0900  Gross per 24 hour  Intake   1320 ml  Output      0 ml  Net   1320 ml    Exam Awake Alert, Oriented x 3, No new F.N deficits, Normal affect [place].AT,PERRAL Supple Neck,No JVD, No cervical lymphadenopathy appriciated.  Symmetrical Chest wall movement, Good air movement bilaterally, minimal scattered end expiratory wheezing. RRR,No Gallops,Rubs or new Murmurs, No Parasternal Heave +ve B.Sounds, Abd Soft, Non tender, No organomegaly appriciated, No rebound -guarding or rigidity. No Cyanosis, Clubbing or edema, No new Rash or bruise  Data Review   CBC w Diff: Lab Results  Component Value Date   WBC 4.4 03/31/2015   HGB 12.5 03/31/2015   HCT 39.8 03/31/2015   PLT 122* 03/31/2015   LYMPHOPCT 21 03/30/2015   MONOPCT 8 03/30/2015    EOSPCT 10 03/30/2015   BASOPCT 1 03/30/2015    CMP: Lab Results  Component Value Date   NA 142 03/31/2015   K 4.3 03/31/2015   CL 107 03/31/2015   CO2 23 03/31/2015   BUN 9 03/31/2015   CREATININE 0.65 03/31/2015  .   Total Time in preparing paper work, data evaluation and todays exam - 35 minutes  Lois Slagel M.D on 04/02/2015 at [facility]   Office  [phone]

## 2015-04-03 ENCOUNTER — Telehealth: Payer: Self-pay

## 2015-04-03 NOTE — Telephone Encounter (Signed)
Call received from the patient.  She was returning the CM call.  She said that she was doing "well" and would appreciate an appointment.  An appointment was scheduled for 04/12/15 @ 1130. No other problems/ questions reported.  Update provided to A. Marcheta Grammes, RN CM.

## 2015-04-03 NOTE — Telephone Encounter (Signed)
Message received from [facility] requesting a hospital follow up appointment for the patient. Call placed to # [phone] (H) and a HIPAA compliant voice mail message was left requesting a call back to # [phone] or [phone].   Update provided to A. Marcheta Grammes, RN CM

## 2015-04-04 LAB — CULTURE, RESPIRATORY W GRAM STAIN
Culture: NORMAL
Gram Stain: NONE SEEN

## 2015-04-04 LAB — CULTURE, RESPIRATORY

## 2015-04-12 ENCOUNTER — Ambulatory Visit: Payer: Self-pay | Attending: Family Medicine | Admitting: Family Medicine

## 2015-04-12 ENCOUNTER — Encounter: Payer: Self-pay | Admitting: Family Medicine

## 2015-04-12 VITALS — BP 127/77 | HR 66 | Temp 97.9°F | Resp 15 | Ht 69.0 in | Wt 368.0 lb

## 2015-04-12 DIAGNOSIS — F329 Major depressive disorder, single episode, unspecified: Secondary | ICD-10-CM | POA: Insufficient documentation

## 2015-04-12 DIAGNOSIS — E119 Type 2 diabetes mellitus without complications: Secondary | ICD-10-CM | POA: Insufficient documentation

## 2015-04-12 DIAGNOSIS — Z79899 Other long term (current) drug therapy: Secondary | ICD-10-CM | POA: Insufficient documentation

## 2015-04-12 DIAGNOSIS — J45901 Unspecified asthma with (acute) exacerbation: Secondary | ICD-10-CM | POA: Insufficient documentation

## 2015-04-12 DIAGNOSIS — F32A Depression, unspecified: Secondary | ICD-10-CM | POA: Insufficient documentation

## 2015-04-12 DIAGNOSIS — Z6841 Body Mass Index (BMI) 40.0 and over, adult: Secondary | ICD-10-CM | POA: Insufficient documentation

## 2015-04-12 DIAGNOSIS — E039 Hypothyroidism, unspecified: Secondary | ICD-10-CM | POA: Insufficient documentation

## 2015-04-12 DIAGNOSIS — E1165 Type 2 diabetes mellitus with hyperglycemia: Secondary | ICD-10-CM | POA: Insufficient documentation

## 2015-04-12 DIAGNOSIS — Z7984 Long term (current) use of oral hypoglycemic drugs: Secondary | ICD-10-CM | POA: Insufficient documentation

## 2015-04-12 DIAGNOSIS — R011 Cardiac murmur, unspecified: Secondary | ICD-10-CM | POA: Insufficient documentation

## 2015-04-12 LAB — GLUCOSE, POCT (MANUAL RESULT ENTRY): POC Glucose: 138 mg/dl — AB (ref 70–99)

## 2015-04-12 MED ORDER — ALBUTEROL SULFATE (2.5 MG/3ML) 0.083% IN NEBU: 2.5000 mg | INHALATION_SOLUTION | Freq: Four times a day (QID) | RESPIRATORY_TRACT | Status: DC | PRN

## 2015-04-12 MED ORDER — CITALOPRAM HYDROBROMIDE 20 MG PO TABS: 40.0000 mg | ORAL_TABLET | Freq: Every day | ORAL | Status: DC

## 2015-04-12 MED ORDER — BECLOMETHASONE DIPROPIONATE 40 MCG/ACT IN AERS: 2.0000 | INHALATION_SPRAY | Freq: Two times a day (BID) | RESPIRATORY_TRACT | Status: DC

## 2015-04-12 MED ORDER — METFORMIN HCL 1000 MG PO TABS: 1000.0000 mg | ORAL_TABLET | Freq: Two times a day (BID) | ORAL | Status: DC

## 2015-04-12 MED ORDER — LEVOTHYROXINE SODIUM 75 MCG PO TABS
225.0000 ug | ORAL_TABLET | Freq: Every day | ORAL | Status: DC
Start: 2015-04-12 — End: 2015-06-02

## 2015-04-12 MED ORDER — ALBUTEROL SULFATE HFA 108 (90 BASE) MCG/ACT IN AERS: 1.0000 | INHALATION_SPRAY | Freq: Four times a day (QID) | RESPIRATORY_TRACT | Status: DC | PRN

## 2015-04-12 MED ORDER — LISINOPRIL 2.5 MG PO TABS: 2.5000 mg | ORAL_TABLET | Freq: Every day | ORAL | Status: DC

## 2015-04-12 MED FILL — ?METFORMIN HCL 1,000 MG TAB: 1000 | 30 days supply | Qty: 60 | Fill #0

## 2015-04-12 MED FILL — LISINOPRIL 2.5 MG TABLET: 2.5 | 30 days supply | Qty: 30 | Fill #0

## 2015-04-12 MED FILL — ?CITALOPRAM HBR 20 MG TABLE: 20 | 15 days supply | Qty: 30 | Fill #0

## 2015-04-12 MED FILL — !QVAR 40 MCG ORAL INHALER: 40 MCG | 30 days supply | Qty: 1 | Fill #0

## 2015-04-12 MED FILL — ?ALBUTEROL SUL 2.5 MG/3 MLS: (2.5 MG/3ML | 8 days supply | Qty: 90 | Fill #0

## 2015-04-12 MED FILL — LEVOTHYROXINE 75 MCG TABLET: 75 | 30 days supply | Qty: 90 | Fill #0

## 2015-04-12 MED FILL — VENTOLIN HFA 90 MCG INHALER: 108 (90 BAS | 30 days supply | Qty: 18 | Fill #0

## 2015-04-12 NOTE — Progress Notes (Signed)
Follow up after asthma exacerbation Feeling better Not using her inhaler appropriately-needs education Has a work paper she needs completed

## 2015-04-12 NOTE — Patient Instructions (Signed)
Asthma, Adult Asthma is a recurring condition in which the airways tighten and narrow. Asthma can make it difficult to breathe. It can cause coughing, wheezing, and shortness of breath. Asthma episodes, also called asthma attacks, range from minor to life-threatening. Asthma cannot be cured, but medicines and lifestyle changes can help control it. CAUSES Asthma is believed to be caused by inherited (genetic) and environmental factors, but its exact cause is unknown. Asthma may be triggered by allergens, lung infections, or irritants in the air. Asthma triggers are different for each person. Common triggers include:   Animal dander.  Dust mites.  Cockroaches.  Pollen from trees or grass.  Mold.  Smoke.  Air pollutants such as dust, household cleaners, hair sprays, aerosol sprays, paint fumes, strong chemicals, or strong odors.  Cold air, weather changes, and winds (which increase molds and pollens in the air).  Strong emotional expressions such as crying or laughing hard.  Stress.  Certain medicines (such as aspirin) or types of drugs (such as beta-blockers).  Sulfites in foods and drinks. Foods and drinks that may contain sulfites include dried fruit, potato chips, and sparkling grape juice.  Infections or inflammatory conditions such as the flu, a cold, or an inflammation of the nasal membranes (rhinitis).  Gastroesophageal reflux disease (GERD).  Exercise or strenuous activity. SYMPTOMS Symptoms may occur immediately after asthma is triggered or many hours later. Symptoms include:  Wheezing.  Excessive nighttime or early morning coughing.  Frequent or severe coughing with a common cold.  Chest tightness.  Shortness of breath. DIAGNOSIS  The diagnosis of asthma is made by a review of your medical history and a physical exam. Tests may also be performed. These may include:  Lung function studies. These tests show how much air you breathe in and out.  Allergy  tests.  Imaging tests such as X-rays. TREATMENT  Asthma cannot be cured, but it can usually be controlled. Treatment involves identifying and avoiding your asthma triggers. It also involves medicines. There are 2 classes of medicine used for asthma treatment:   Controller medicines. These prevent asthma symptoms from occurring. They are usually taken every day.  Reliever or rescue medicines. These quickly relieve asthma symptoms. They are used as needed and provide short-term relief. Your health care provider will help you create an asthma action plan. An asthma action plan is a written plan for managing and treating your asthma attacks. It includes a list of your asthma triggers and how they may be avoided. It also includes information on when medicines should be taken and when their dosage should be changed. An action plan may also involve the use of a device called a peak flow meter. A peak flow meter measures how well the lungs are working. It helps you monitor your condition. HOME CARE INSTRUCTIONS   Take medicines only as directed by your health care provider. Speak with your health care provider if you have questions about how or when to take the medicines.  Use a peak flow meter as directed by your health care provider. Record and keep track of readings.  Understand and use the action plan to help minimize or stop an asthma attack without needing to seek medical care.  Control your home environment in the following ways to help prevent asthma attacks:  Do not smoke. Avoid being exposed to secondhand smoke.  Change your heating and air conditioning filter regularly.  Limit your use of fireplaces and wood stoves.  Get rid of pests (such as roaches   and mice) and their droppings.  Throw away plants if you see mold on them.  Clean your floors and dust regularly. Use unscented cleaning products.  Try to have someone else vacuum for you regularly. Stay out of rooms while they are  being vacuumed and for a short while afterward. If you vacuum, use a dust mask from a hardware store, a double-layered or microfilter vacuum cleaner bag, or a vacuum cleaner with a HEPA filter.  Replace carpet with wood, tile, or vinyl flooring. Carpet can trap dander and dust.  Use allergy-proof pillows, mattress covers, and box spring covers.  Wash bed sheets and blankets every week in hot water and dry them in a dryer.  Use blankets that are made of polyester or cotton.  Clean bathrooms and kitchens with bleach. If possible, have someone repaint the walls in these rooms with mold-resistant paint. Keep out of the rooms that are being cleaned and painted.  Wash hands frequently. SEEK MEDICAL CARE IF:   You have wheezing, shortness of breath, or a cough even if taking medicine to prevent attacks.  The colored mucus you cough up (sputum) is thicker than usual.  Your sputum changes from clear or white to yellow, green, gray, or bloody.  You have any problems that may be related to the medicines you are taking (such as a rash, itching, swelling, or trouble breathing).  You are using a reliever medicine more than 2-3 times per week.  Your peak flow is still at 50-79% of your personal best after following your action plan for 1 hour.  You have a fever. SEEK IMMEDIATE MEDICAL CARE IF:   You seem to be getting worse and are unresponsive to treatment during an asthma attack.  You are short of breath even at rest.  You get short of breath when doing very little physical activity.  You have difficulty eating, drinking, or talking due to asthma symptoms.  You develop chest pain.  You develop a fast heartbeat.  You have a bluish color to your lips or fingernails.  You are light-headed, dizzy, or faint.  Your peak flow is less than 50% of your personal best.   This information is not intended to replace advice given to you by your health care provider. Make sure you discuss any  questions you have with your health care provider.   Document Released: 01/28/2005 Document Revised: 10/19/2014 Document Reviewed: 08/27/2012 Elsevier Interactive Patient Education 2016 [employer]  

## 2015-04-12 NOTE — Progress Notes (Signed)
Subjective:  Patient ID: Janet [name], female    DOB: Dec 21, 1970  Age: 45 y.o. MRN: [id number]  CC: Hospitalization Follow-up   HPI Janet [name] is a 45 year old obese female with a history of type 2 diabetes mellitus (A1c 8.7) hypothyroidism, depression, asthma who was recently hospitalized for asthma exacerbation at [facility] from 03/30/15 through 04/02/15.  She was admitted due to failure of outpatient management of asthma exacerbation in which she had been on amoxicillin, bronchodilators and antihistamines. She received Solu-Medrol, azithromycin and Rocephin. Chest x-ray was negative for acute abnormalities. Her metformin was also increased from 1 g daily to 1 g twice daily due to hyperglycemia. Her condition improved and she was subsequently discharged on prednisone, albuterol nebulizer and Proventil inhaler.  Today she reports that wheezing has ceased and she has no chest pains but she states she has "to work hard to breathe"and is using her nebulizer machine several times a day. She will also need a note to return to work.  Outpatient Prescriptions Prior to Visit  Medication Sig Dispense Refill  . predniSONE (DELTASONE) 10 MG tablet Please take 4 tablets oral daily for 3 days, then  3 tablets oral daily for 3 days, then 2 tablets oral daily for 3 days, then 1 tablet oral daily for  days then stop 30 tablet 0  . albuterol (PROVENTIL HFA;VENTOLIN HFA) 108 (90 Base) MCG/ACT inhaler Inhale 1-2 puffs into the lungs every 6 (six) hours as needed for wheezing or shortness of breath. 1 Inhaler 0  . albuterol (PROVENTIL) (2.5 MG/3ML) 0.083% nebulizer solution Take 3 mLs (2.5 mg total) by nebulization every 6 (six) hours as needed for wheezing or shortness of breath. 75 mL 12  . citalopram (CELEXA) 20 MG tablet Take 2 tablets (40 mg total) by mouth daily. 30 tablet 0  . levothyroxine (SYNTHROID, LEVOTHROID) 125 MCG tablet Take 2 tablets (250 mcg total) by mouth daily before  breakfast. 30 tablet 0  . metFORMIN (GLUCOPHAGE) 1000 MG tablet Take 1 tablet (1,000 mg total) by mouth 2 (two) times daily with a meal. 60 tablet 0  . guaiFENesin (MUCINEX) 600 MG 12 hr tablet Take 1 tablet (600 mg total) by mouth 2 (two) times daily. (Patient not taking: Reported on 04/12/2015) 20 tablet 0  . fluconazole (DIFLUCAN) 100 MG tablet Take 1 tablet (100 mg total) by mouth daily. 5 tablet 0  . nystatin (MYCOSTATIN/NYSTOP) 100000 UNIT/GM POWD Apply topically to affected area 2 times daily 30 g 0   No facility-administered medications prior to visit.    ROS Review of Systems  Constitutional: Negative for activity change, appetite change and fatigue.  HENT: Negative for congestion, sinus pressure and sore throat.   Eyes: Negative for visual disturbance.  Respiratory: Negative for cough, chest tightness, shortness of breath and wheezing.   Cardiovascular: Negative for chest pain and palpitations.  Gastrointestinal: Negative for abdominal pain, constipation and abdominal distention.  Endocrine: Negative for polydipsia.  Genitourinary: Negative for dysuria and frequency.  Musculoskeletal: Negative for back pain and arthralgias.  Skin: Negative for rash.  Neurological: Negative for tremors, light-headedness and numbness.  Hematological: Does not bruise/bleed easily.  Psychiatric/Behavioral: Negative for behavioral problems and agitation.    Objective:  BP 127/77 mmHg  Pulse 66  Temp(Src) 97.9 F (36.6 C)  Resp 15  Ht 5\' 9"  (1.753 m)  Wt 368 lb (166.924 kg)  BMI 54.32 kg/m2  SpO2 95%  BP/Weight 04/12/2015 04/02/2015 [id number]  Systolic BP [id number] -  Diastolic BP  77 67 -  Wt. (Lbs) 368 - 374.4  BMI 54.32 - 55.26      Physical Exam  Constitutional: She is oriented to person, place, and time. She appears well-developed and well-nourished.  Morbidly obese  Cardiovascular: Normal rate and intact distal pulses.   Murmur (2/6 ) heard. Pulmonary/Chest: Effort normal and  breath sounds normal. She has no wheezes. She has no rales. She exhibits no tenderness.  Abdominal: Soft. Bowel sounds are normal. She exhibits no distension and no mass. There is no tenderness.  Musculoskeletal: Normal range of motion.  Neurological: She is alert and oriented to person, place, and time.   Lab Results  Component Value Date   TSH 0.074* 03/31/2015    Lab Results  Component Value Date   HGBA1C 8.7* 03/31/2015    CMP Latest Ref Rng 03/31/2015 03/30/2015 03/19/2015  Glucose 65 - 99 mg/dL 323(H) 182(H) 265(H)  BUN 6 - 20 mg/dL 9 8 8   Creatinine 0.44 - 1.00 mg/dL 0.65 0.71 0.60  Sodium 135 - 145 mmol/L 142 140 139  Potassium 3.5 - 5.1 mmol/L 4.3 3.3(L) 3.3(L)  Chloride 101 - 111 mmol/L 107 104 105  CO2 22 - 32 mmol/L 23 25 26   Calcium 8.9 - 10.3 mg/dL 9.8 8.8(L) 8.8(L)    CLINICAL DATA: Shortness of breath for 2 weeks.  EXAM: CHEST 2 VIEW  COMPARISON: March 19, 2015.  FINDINGS: The heart size and mediastinal contours are within normal limits. Both lungs are clear. No pneumothorax or pleural effusion is noted. The visualized skeletal structures are unremarkable.  IMPRESSION: No active cardiopulmonary disease.   Electronically Signed  By: Marijo Conception, M.D.  On: 03/30/2015 15:31   Assessment & Plan:   1. Type 2 diabetes mellitus without complication, without long-term current use of insulin (HCC) Uncontrolled with A1c of 8.7 CBG is 138 which reveals some improvement in glycemic control and since metformin dose was increased during hospitalization, I will make no changes to regimen. - Glucose (CBG) - lisinopril (ZESTRIL) 2.5 MG tablet; Take 1 tablet (2.5 mg total) by mouth daily.  Dispense: 30 tablet; Refill: 2 - metFORMIN (GLUCOPHAGE) 1000 MG tablet; Take 1 tablet (1,000 mg total) by mouth 2 (two) times daily with a meal.  Dispense: 60 tablet; Refill: 2  2. Hypothyroidism, unspecified hypothyroidism type Last TSH was suppressed Dose of  Synthroid decreased from 251mcg to 254mcg - levothyroxine (SYNTHROID, LEVOTHROID) 75 MCG tablet; Take 3 tablets (225 mcg total) by mouth daily before breakfast.  Dispense: 90 tablet; Refill: 2  3. Asthma exacerbation Exacerbation has resolved but due to increased work of breathing I am adding an inhaled steroid - beclomethasone (QVAR) 40 MCG/ACT inhaler; Inhale 2 puffs into the lungs 2 (two) times daily.  Dispense: 1 Inhaler; Refill: 3 - albuterol (PROVENTIL HFA;VENTOLIN HFA) 108 (90 Base) MCG/ACT inhaler; Inhale 1-2 puffs into the lungs every 6 (six) hours as needed for wheezing or shortness of breath.  Dispense: 1 Inhaler; Refill: 2 - albuterol (PROVENTIL) (2.5 MG/3ML) 0.083% nebulizer solution; Take 3 mLs (2.5 mg total) by nebulization every 6 (six) hours as needed for wheezing or shortness of breath.  Dispense: 75 mL; Refill: 2  4. Morbid obesity due to excess calories (HCC) Increase physical activity, reduce portion sizes  5. Depression Controlled on Celexa  6.Heart murmur She is unaware of a previous heart murmur and will need a 2-D echo for workup however she has no medical coverage and so I have advised her to apply for the [facility] discount which revealed facilitate the work up. - citalopram (CELEXA) 20 MG tablet; Take 2 tablets (40 mg total) by mouth daily.  Dispense: 30 tablet; Refill: 2   Meds ordered this encounter  Medications  . beclomethasone (QVAR) 40 MCG/ACT inhaler    Sig: Inhale 2 puffs into the lungs 2 (two) times daily.    Dispense:  1 Inhaler    Refill:  3  . lisinopril (ZESTRIL) 2.5 MG tablet    Sig: Take 1 tablet (2.5 mg total) by mouth daily.    Dispense:  30 tablet    Refill:  2  . levothyroxine (SYNTHROID, LEVOTHROID) 75 MCG tablet    Sig: Take 3 tablets (225 mcg total) by mouth daily before breakfast.    Dispense:  90 tablet    Refill:  2    Discontinue previous dose  . metFORMIN (GLUCOPHAGE) 1000 MG tablet    Sig: Take 1 tablet (1,000 mg total) by  mouth 2 (two) times daily with a meal.    Dispense:  60 tablet    Refill:  2  . citalopram (CELEXA) 20 MG tablet    Sig: Take 2 tablets (40 mg total) by mouth daily.    Dispense:  30 tablet    Refill:  2  . albuterol (PROVENTIL HFA;VENTOLIN HFA) 108 (90 Base) MCG/ACT inhaler    Sig: Inhale 1-2 puffs into the lungs every 6 (six) hours as needed for wheezing or shortness of breath.    Dispense:  1 Inhaler    Refill:  2  . albuterol (PROVENTIL) (2.5 MG/3ML) 0.083% nebulizer solution    Sig: Take 3 mLs (2.5 mg total) by nebulization every 6 (six) hours as needed for wheezing or shortness of breath.    Dispense:  75 mL    Refill:  2    Follow-up: Return in about 2 weeks (around 04/26/2015) for Follow-up on asthma.   Arnoldo Morale MD

## 2015-04-20 ENCOUNTER — Institutional Professional Consult (permissible substitution): Payer: Self-pay | Admitting: Internal Medicine

## 2015-04-26 ENCOUNTER — Ambulatory Visit: Payer: Self-pay | Admitting: Family Medicine

## 2015-05-29 ENCOUNTER — Ambulatory Visit: Payer: Self-pay | Attending: Family Medicine | Admitting: Family Medicine

## 2015-05-29 ENCOUNTER — Encounter: Payer: Self-pay | Admitting: Family Medicine

## 2015-05-29 VITALS — BP 137/83 | HR 86 | Temp 98.2°F | Resp 16 | Ht 69.0 in | Wt 359.4 lb

## 2015-05-29 DIAGNOSIS — Z888 Allergy status to other drugs, medicaments and biological substances status: Secondary | ICD-10-CM | POA: Insufficient documentation

## 2015-05-29 DIAGNOSIS — E118 Type 2 diabetes mellitus with unspecified complications: Secondary | ICD-10-CM

## 2015-05-29 DIAGNOSIS — E1165 Type 2 diabetes mellitus with hyperglycemia: Secondary | ICD-10-CM

## 2015-05-29 DIAGNOSIS — E079 Disorder of thyroid, unspecified: Secondary | ICD-10-CM | POA: Insufficient documentation

## 2015-05-29 DIAGNOSIS — R05 Cough: Secondary | ICD-10-CM | POA: Insufficient documentation

## 2015-05-29 DIAGNOSIS — IMO0002 Reserved for concepts with insufficient information to code with codable children: Secondary | ICD-10-CM

## 2015-05-29 DIAGNOSIS — Z881 Allergy status to other antibiotic agents status: Secondary | ICD-10-CM | POA: Insufficient documentation

## 2015-05-29 DIAGNOSIS — E669 Obesity, unspecified: Secondary | ICD-10-CM | POA: Insufficient documentation

## 2015-05-29 DIAGNOSIS — Z79899 Other long term (current) drug therapy: Secondary | ICD-10-CM | POA: Insufficient documentation

## 2015-05-29 DIAGNOSIS — J45901 Unspecified asthma with (acute) exacerbation: Secondary | ICD-10-CM | POA: Insufficient documentation

## 2015-05-29 DIAGNOSIS — Z7984 Long term (current) use of oral hypoglycemic drugs: Secondary | ICD-10-CM | POA: Insufficient documentation

## 2015-05-29 DIAGNOSIS — E039 Hypothyroidism, unspecified: Secondary | ICD-10-CM | POA: Insufficient documentation

## 2015-05-29 DIAGNOSIS — E119 Type 2 diabetes mellitus without complications: Secondary | ICD-10-CM | POA: Insufficient documentation

## 2015-05-29 DIAGNOSIS — F329 Major depressive disorder, single episode, unspecified: Secondary | ICD-10-CM | POA: Insufficient documentation

## 2015-05-29 DIAGNOSIS — R079 Chest pain, unspecified: Secondary | ICD-10-CM | POA: Insufficient documentation

## 2015-05-29 LAB — GLUCOSE, POCT (MANUAL RESULT ENTRY): POC Glucose: 108 mg/dl — AB (ref 70–99)

## 2015-05-29 MED ORDER — PREDNISONE 20 MG PO TABS: 20.0000 mg | ORAL_TABLET | Freq: Two times a day (BID) | ORAL | Status: DC

## 2015-05-29 MED ORDER — LEVOFLOXACIN 750 MG PO TABS: 750.0000 mg | ORAL_TABLET | Freq: Every day | ORAL | Status: DC

## 2015-05-29 MED ORDER — CEFTRIAXONE SODIUM 500 MG IJ SOLR: 500.0000 mg | Freq: Once | INTRAMUSCULAR | Status: DC

## 2015-05-29 MED ORDER — IPRATROPIUM-ALBUTEROL 0.5-2.5 (3) MG/3ML IN SOLN
3.0000 mL | Freq: Once | RESPIRATORY_TRACT | Status: AC
  Administered 2015-05-29: 3 mL via RESPIRATORY_TRACT

## 2015-05-29 MED ORDER — METHYLPREDNISOLONE SODIUM SUCC 125 MG IJ SOLR
125.0000 mg | Freq: Once | INTRAMUSCULAR | Status: AC
  Administered 2015-05-29: 125 mg via INTRAMUSCULAR

## 2015-05-29 MED FILL — levoFLOXacin 750 MG TABS: 750 | 5 days supply | Qty: 5 | Fill #0

## 2015-05-29 MED FILL — predniSONE 20 MG TABS: 20 | 3 days supply | Qty: 6 | Fill #0

## 2015-05-29 NOTE — Progress Notes (Signed)
CC: Follow-up on asthma  HPI: Janet [name] is a 45 y.o. female with a medical history of type 2 diabetes mellitus (A1c 8.7), obesity, hypothyroidism, depression, asthma here today for a follow up visit.   At her last office visit Qvar was added to her regimen and she comes in today complaining of wheezing, shortness of breath and having to use the MDI and nebulizer several times a day with no relief in symptoms. She is wheezing and short of breath while speaking. Admits to some cough and chest pain associated with cough but cough is productive of yellowish sputum occasionally; denies fever or body aches.  Has been compliant with levothyroxine for hypothyroidism and remains on metformin for diabetes mellitus.    Allergies  Allergen Reactions  . Ciprofloxacin Shortness Of Breath   Past Medical History  Diagnosis Date  . Thyroid disease   . Depression   . Asthma    Current Outpatient Prescriptions on File Prior to Visit  Medication Sig Dispense Refill  . albuterol (PROVENTIL HFA;VENTOLIN HFA) 108 (90 Base) MCG/ACT inhaler Inhale 1-2 puffs into the lungs every 6 (six) hours as needed for wheezing or shortness of breath. 1 Inhaler 2  . albuterol (PROVENTIL) (2.5 MG/3ML) 0.083% nebulizer solution Take 3 mLs (2.5 mg total) by nebulization every 6 (six) hours as needed for wheezing or shortness of breath. 75 mL 2  . beclomethasone (QVAR) 40 MCG/ACT inhaler Inhale 2 puffs into the lungs 2 (two) times daily. 1 Inhaler 3  . citalopram (CELEXA) 20 MG tablet Take 2 tablets (40 mg total) by mouth daily. 30 tablet 2  . guaiFENesin (MUCINEX) 600 MG 12 hr tablet Take 1 tablet (600 mg total) by mouth 2 (two) times daily. (Patient not taking: Reported on 04/12/2015) 20 tablet 0  . levothyroxine (SYNTHROID, LEVOTHROID) 75 MCG tablet Take 3 tablets (225 mcg total) by mouth daily before breakfast. 90 tablet 2  . lisinopril (ZESTRIL) 2.5 MG tablet Take 1 tablet (2.5 mg total) by mouth daily. 30 tablet 2    . metFORMIN (GLUCOPHAGE) 1000 MG tablet Take 1 tablet (1,000 mg total) by mouth 2 (two) times daily with a meal. 60 tablet 2  . predniSONE (DELTASONE) 10 MG tablet Please take 4 tablets oral daily for 3 days, then  3 tablets oral daily for 3 days, then 2 tablets oral daily for 3 days, then 1 tablet oral daily for  days then stop 30 tablet 0   No current facility-administered medications on file prior to visit.   No family history on file. Social History   Social History  . Marital Status: Married    Spouse Name: N/A  . Number of Children: N/A  . Years of Education: N/A   Occupational History  . Not on file.   Social History Main Topics  . Smoking status: Never Smoker   . Smokeless tobacco: Not on file  . Alcohol Use: No  . Drug Use: No  . Sexual Activity: Not on file   Other Topics Concern  . Not on file   Social History Narrative    Review of Systems: Constitutional: Negative for fever, chills, diaphoresis, activity change, appetite change and fatigue. HENT: Negative for ear pain, nosebleeds, congestion, facial swelling, rhinorrhea, neck pain, neck stiffness and ear discharge.  Eyes: Negative for pain, discharge, redness, itching and visual disturbance. Respiratory: See history of present illness  Cardiovascular: Positive for chest pain, negative for palpitations and leg swelling. Gastrointestinal: Negative for abdominal distention. Genitourinary: Negative for  dysuria, urgency, frequency, hematuria, flank pain, decreased urine volume, difficulty urinating and dyspareunia.  Musculoskeletal: Negative for back pain, joint swelling, arthralgias and gait problem. Neurological: Negative for dizziness, tremors, seizures, syncope, facial asymmetry, speech difficulty, weakness, light-headedness, numbness and headaches.  Hematological: Negative for adenopathy. Does not bruise/bleed easily. Psychiatric/Behavioral: Negative for hallucinations, behavioral problems, confusion, dysphoric  mood, decreased concentration and agitation.    Objective: Filed Vitals:   05/29/15 1601  BP: 137/83  Pulse: 86  Temp: 98.2 F (36.8 C)  TempSrc: Oral  Resp: 16  Height: 5\' 9"  (1.753 m)  Weight: 359 lb 6.4 oz (163.023 kg)  SpO2: 96%      Physical Exam: Constitutional: Patient appears well-developed and well-nourished. No distress. Neck: Normal ROM. Neck supple. No JVD. No tracheal deviation. No thyromegaly. CVS: RRR, S1/S2 +, no murmurs, no gallops, no carotid bruit.  Pulmonary: Increased work of breathing, wheezing and speaking in short sentences; auscultation of both lungs in all lung fields revealed expiratory wheezing. Abdominal: Soft. BS +,  no distension, tenderness, rebound or guarding.  Musculoskeletal: Normal range of motion. No edema and no tenderness.  Lymphadenopathy: No lymphadenopathy noted, cervical, inguinal or axillary Neuro: Alert. Normal reflexes, muscle tone coordination. No cranial nerve deficit. Skin: Skin is warm and dry. No rash noted. Not diaphoretic. No erythema. No pallor. Psychiatric: Normal mood and affect. Behavior, judgment, thought content normal.  Lab Results  Component Value Date   WBC 4.4 03/31/2015   HGB 12.5 03/31/2015   HCT 39.8 03/31/2015   MCV 84.7 03/31/2015   PLT 122* 03/31/2015   Lab Results  Component Value Date   CREATININE 0.65 03/31/2015   BUN 9 03/31/2015   NA 142 03/31/2015   K 4.3 03/31/2015   CL 107 03/31/2015   CO2 23 03/31/2015    Lab Results  Component Value Date   HGBA1C 8.7* 03/31/2015   Lipid Panel  No results found for: CHOL, TRIG, HDL, CHOLHDL, VLDL, LDLCALC     Assessment and plan:  1. Type 2 diabetes mellitus without complication, without long-term current use of insulin (HCC) Uncontrolled with A1c of 8.7 Blood sugar is likely to be elevated given administration of IM Solu-Medrol and so I have advised her to take an extra 500 mg of metformin for the next one week.  2. Hypothyroidism, unspecified  hypothyroidism type Last TSH was suppressed Dose of Synthroid decreased from 261mcg to 255mcg - levothyroxine (SYNTHROID, LEVOTHROID) 75 MCG tablet; Take 3 tablets (225 mcg total) by mouth daily before breakfast.  Dispense: 90 tablet; Refill: 2  3. Asthma exacerbation Acute exacerbation likely triggered by Pollen Intramuscular Solu-Medrol, oral prednisone for the next 3 days DuoNeb administered in the clinic with resulting cessation of wheezing while speaking and decerease in work of breathing however wheezing on auscultation persisted. Placed on Levaquin presumptively (chart reveals allergy to ciprofloxacin however the patient states she was short of breath and unsure if this was related to asthma symptoms versus medication) We'll reassess in 4 days and placed on Advair (Qvar discontinued as ineffective) Continue albuterol nebs every 6 when necessary       Arnoldo Morale, MD. [facility] [phone] 05/29/2015, 4:03 PM

## 2015-05-29 NOTE — Patient Instructions (Signed)
Asthma, Adult Asthma is a recurring condition in which the airways tighten and narrow. Asthma can make it difficult to breathe. It can cause coughing, wheezing, and shortness of breath. Asthma episodes, also called asthma attacks, range from minor to life-threatening. Asthma cannot be cured, but medicines and lifestyle changes can help control it. CAUSES Asthma is believed to be caused by inherited (genetic) and environmental factors, but its exact cause is unknown. Asthma may be triggered by allergens, lung infections, or irritants in the air. Asthma triggers are different for each person. Common triggers include:   Animal dander.  Dust mites.  Cockroaches.  Pollen from trees or grass.  Mold.  Smoke.  Air pollutants such as dust, household cleaners, hair sprays, aerosol sprays, paint fumes, strong chemicals, or strong odors.  Cold air, weather changes, and winds (which increase molds and pollens in the air).  Strong emotional expressions such as crying or laughing hard.  Stress.  Certain medicines (such as aspirin) or types of drugs (such as beta-blockers).  Sulfites in foods and drinks. Foods and drinks that may contain sulfites include dried fruit, potato chips, and sparkling grape juice.  Infections or inflammatory conditions such as the flu, a cold, or an inflammation of the nasal membranes (rhinitis).  Gastroesophageal reflux disease (GERD).  Exercise or strenuous activity. SYMPTOMS Symptoms may occur immediately after asthma is triggered or many hours later. Symptoms include:  Wheezing.  Excessive nighttime or early morning coughing.  Frequent or severe coughing with a common cold.  Chest tightness.  Shortness of breath. DIAGNOSIS  The diagnosis of asthma is made by a review of your medical history and a physical exam. Tests may also be performed. These may include:  Lung function studies. These tests show how much air you breathe in and out.  Allergy  tests.  Imaging tests such as X-rays. TREATMENT  Asthma cannot be cured, but it can usually be controlled. Treatment involves identifying and avoiding your asthma triggers. It also involves medicines. There are 2 classes of medicine used for asthma treatment:   Controller medicines. These prevent asthma symptoms from occurring. They are usually taken every day.  Reliever or rescue medicines. These quickly relieve asthma symptoms. They are used as needed and provide short-term relief. Your health care provider will help you create an asthma action plan. An asthma action plan is a written plan for managing and treating your asthma attacks. It includes a list of your asthma triggers and how they may be avoided. It also includes information on when medicines should be taken and when their dosage should be changed. An action plan may also involve the use of a device called a peak flow meter. A peak flow meter measures how well the lungs are working. It helps you monitor your condition. HOME CARE INSTRUCTIONS   Take medicines only as directed by your health care provider. Speak with your health care provider if you have questions about how or when to take the medicines.  Use a peak flow meter as directed by your health care provider. Record and keep track of readings.  Understand and use the action plan to help minimize or stop an asthma attack without needing to seek medical care.  Control your home environment in the following ways to help prevent asthma attacks:  Do not smoke. Avoid being exposed to secondhand smoke.  Change your heating and air conditioning filter regularly.  Limit your use of fireplaces and wood stoves.  Get rid of pests (such as roaches   and mice) and their droppings.  Throw away plants if you see mold on them.  Clean your floors and dust regularly. Use unscented cleaning products.  Try to have someone else vacuum for you regularly. Stay out of rooms while they are  being vacuumed and for a short while afterward. If you vacuum, use a dust mask from a hardware store, a double-layered or microfilter vacuum cleaner bag, or a vacuum cleaner with a HEPA filter.  Replace carpet with wood, tile, or vinyl flooring. Carpet can trap dander and dust.  Use allergy-proof pillows, mattress covers, and box spring covers.  Wash bed sheets and blankets every week in hot water and dry them in a dryer.  Use blankets that are made of polyester or cotton.  Clean bathrooms and kitchens with bleach. If possible, have someone repaint the walls in these rooms with mold-resistant paint. Keep out of the rooms that are being cleaned and painted.  Wash hands frequently. SEEK MEDICAL CARE IF:   You have wheezing, shortness of breath, or a cough even if taking medicine to prevent attacks.  The colored mucus you cough up (sputum) is thicker than usual.  Your sputum changes from clear or white to yellow, green, gray, or bloody.  You have any problems that may be related to the medicines you are taking (such as a rash, itching, swelling, or trouble breathing).  You are using a reliever medicine more than 2-3 times per week.  Your peak flow is still at 50-79% of your personal best after following your action plan for 1 hour.  You have a fever. SEEK IMMEDIATE MEDICAL CARE IF:   You seem to be getting worse and are unresponsive to treatment during an asthma attack.  You are short of breath even at rest.  You get short of breath when doing very little physical activity.  You have difficulty eating, drinking, or talking due to asthma symptoms.  You develop chest pain.  You develop a fast heartbeat.  You have a bluish color to your lips or fingernails.  You are light-headed, dizzy, or faint.  Your peak flow is less than 50% of your personal best.   This information is not intended to replace advice given to you by your health care provider. Make sure you discuss any  questions you have with your health care provider.   Document Released: 01/28/2005 Document Revised: 10/19/2014 Document Reviewed: 08/27/2012 Elsevier Interactive Patient Education 2016 [employer]  

## 2015-05-29 NOTE — Progress Notes (Signed)
Patient tolerated Nebulizer treatment well. Patient tolerated injection well.

## 2015-05-29 NOTE — Progress Notes (Signed)
Patient's here for f/up asthma.   Patient has had tx this morning and at noon. On average, patient can do 4 tx's daily.  Patient requesting refill on levothyroxine.

## 2015-05-30 ENCOUNTER — Other Ambulatory Visit: Payer: Self-pay | Admitting: Family Medicine

## 2015-05-30 LAB — MICROALBUMIN / CREATININE URINE RATIO
Creatinine, Urine: 130 mg/dL (ref 20–320)
Microalb Creat Ratio: 6 mcg/mg creat (ref <30)
Microalb, Ur: 0.8 mg/dL

## 2015-05-30 MED FILL — ALBUTEROL 0.083% INHAL SOLN: (2.5 MG/3ML | 8 days supply | Qty: 90 | Fill #1

## 2015-06-02 ENCOUNTER — Ambulatory Visit: Payer: Self-pay | Attending: Family Medicine | Admitting: Family Medicine

## 2015-06-02 ENCOUNTER — Encounter: Payer: Self-pay | Admitting: Family Medicine

## 2015-06-02 VITALS — BP 131/78 | HR 60 | Temp 98.4°F | Resp 16 | Ht 69.0 in | Wt 358.0 lb

## 2015-06-02 DIAGNOSIS — J4540 Moderate persistent asthma, uncomplicated: Secondary | ICD-10-CM | POA: Insufficient documentation

## 2015-06-02 DIAGNOSIS — E118 Type 2 diabetes mellitus with unspecified complications: Secondary | ICD-10-CM

## 2015-06-02 DIAGNOSIS — Z79899 Other long term (current) drug therapy: Secondary | ICD-10-CM | POA: Insufficient documentation

## 2015-06-02 DIAGNOSIS — IMO0002 Reserved for concepts with insufficient information to code with codable children: Secondary | ICD-10-CM

## 2015-06-02 DIAGNOSIS — E039 Hypothyroidism, unspecified: Secondary | ICD-10-CM | POA: Insufficient documentation

## 2015-06-02 DIAGNOSIS — J4541 Moderate persistent asthma with (acute) exacerbation: Secondary | ICD-10-CM

## 2015-06-02 DIAGNOSIS — E1165 Type 2 diabetes mellitus with hyperglycemia: Secondary | ICD-10-CM | POA: Insufficient documentation

## 2015-06-02 DIAGNOSIS — Z7984 Long term (current) use of oral hypoglycemic drugs: Secondary | ICD-10-CM | POA: Insufficient documentation

## 2015-06-02 LAB — GLUCOSE, POCT (MANUAL RESULT ENTRY): POC GLUCOSE: 96 mg/dL (ref 70–99)

## 2015-06-02 MED ORDER — FLUTICASONE-SALMETEROL 250-50 MCG/DOSE IN AEPB: 1.0000 | INHALATION_SPRAY | Freq: Two times a day (BID) | RESPIRATORY_TRACT | Status: DC

## 2015-06-02 MED ORDER — LEVOTHYROXINE SODIUM 75 MCG PO TABS: 225.0000 ug | ORAL_TABLET | Freq: Every day | ORAL | Status: DC

## 2015-06-02 MED FILL — **ADVAIR 250/50 DISKUS: 250-50 MCG | 14 days supply | Qty: 28 | Fill #0

## 2015-06-02 MED FILL — LEVOTHYROXINE 75 MCG TABLET: 75 | 30 days supply | Qty: 90 | Fill #0

## 2015-06-02 NOTE — Patient Instructions (Signed)
Asthma, Adult Asthma is a recurring condition in which the airways tighten and narrow. Asthma can make it difficult to breathe. It can cause coughing, wheezing, and shortness of breath. Asthma episodes, also called asthma attacks, range from minor to life-threatening. Asthma cannot be cured, but medicines and lifestyle changes can help control it. CAUSES Asthma is believed to be caused by inherited (genetic) and environmental factors, but its exact cause is unknown. Asthma may be triggered by allergens, lung infections, or irritants in the air. Asthma triggers are different for each person. Common triggers include:   Animal dander.  Dust mites.  Cockroaches.  Pollen from trees or grass.  Mold.  Smoke.  Air pollutants such as dust, household cleaners, hair sprays, aerosol sprays, paint fumes, strong chemicals, or strong odors.  Cold air, weather changes, and winds (which increase molds and pollens in the air).  Strong emotional expressions such as crying or laughing hard.  Stress.  Certain medicines (such as aspirin) or types of drugs (such as beta-blockers).  Sulfites in foods and drinks. Foods and drinks that may contain sulfites include dried fruit, potato chips, and sparkling grape juice.  Infections or inflammatory conditions such as the flu, a cold, or an inflammation of the nasal membranes (rhinitis).  Gastroesophageal reflux disease (GERD).  Exercise or strenuous activity. SYMPTOMS Symptoms may occur immediately after asthma is triggered or many hours later. Symptoms include:  Wheezing.  Excessive nighttime or early morning coughing.  Frequent or severe coughing with a common cold.  Chest tightness.  Shortness of breath. DIAGNOSIS  The diagnosis of asthma is made by a review of your medical history and a physical exam. Tests may also be performed. These may include:  Lung function studies. These tests show how much air you breathe in and out.  Allergy  tests.  Imaging tests such as X-rays. TREATMENT  Asthma cannot be cured, but it can usually be controlled. Treatment involves identifying and avoiding your asthma triggers. It also involves medicines. There are 2 classes of medicine used for asthma treatment:   Controller medicines. These prevent asthma symptoms from occurring. They are usually taken every day.  Reliever or rescue medicines. These quickly relieve asthma symptoms. They are used as needed and provide short-term relief. Your health care provider will help you create an asthma action plan. An asthma action plan is a written plan for managing and treating your asthma attacks. It includes a list of your asthma triggers and how they may be avoided. It also includes information on when medicines should be taken and when their dosage should be changed. An action plan may also involve the use of a device called a peak flow meter. A peak flow meter measures how well the lungs are working. It helps you monitor your condition. HOME CARE INSTRUCTIONS   Take medicines only as directed by your health care provider. Speak with your health care provider if you have questions about how or when to take the medicines.  Use a peak flow meter as directed by your health care provider. Record and keep track of readings.  Understand and use the action plan to help minimize or stop an asthma attack without needing to seek medical care.  Control your home environment in the following ways to help prevent asthma attacks:  Do not smoke. Avoid being exposed to secondhand smoke.  Change your heating and air conditioning filter regularly.  Limit your use of fireplaces and wood stoves.  Get rid of pests (such as roaches   and mice) and their droppings.  Throw away plants if you see mold on them.  Clean your floors and dust regularly. Use unscented cleaning products.  Try to have someone else vacuum for you regularly. Stay out of rooms while they are  being vacuumed and for a short while afterward. If you vacuum, use a dust mask from a hardware store, a double-layered or microfilter vacuum cleaner bag, or a vacuum cleaner with a HEPA filter.  Replace carpet with wood, tile, or vinyl flooring. Carpet can trap dander and dust.  Use allergy-proof pillows, mattress covers, and box spring covers.  Wash bed sheets and blankets every week in hot water and dry them in a dryer.  Use blankets that are made of polyester or cotton.  Clean bathrooms and kitchens with bleach. If possible, have someone repaint the walls in these rooms with mold-resistant paint. Keep out of the rooms that are being cleaned and painted.  Wash hands frequently. SEEK MEDICAL CARE IF:   You have wheezing, shortness of breath, or a cough even if taking medicine to prevent attacks.  The colored mucus you cough up (sputum) is thicker than usual.  Your sputum changes from clear or white to yellow, green, gray, or bloody.  You have any problems that may be related to the medicines you are taking (such as a rash, itching, swelling, or trouble breathing).  You are using a reliever medicine more than 2-3 times per week.  Your peak flow is still at 50-79% of your personal best after following your action plan for 1 hour.  You have a fever. SEEK IMMEDIATE MEDICAL CARE IF:   You seem to be getting worse and are unresponsive to treatment during an asthma attack.  You are short of breath even at rest.  You get short of breath when doing very little physical activity.  You have difficulty eating, drinking, or talking due to asthma symptoms.  You develop chest pain.  You develop a fast heartbeat.  You have a bluish color to your lips or fingernails.  You are light-headed, dizzy, or faint.  Your peak flow is less than 50% of your personal best.   This information is not intended to replace advice given to you by your health care provider. Make sure you discuss any  questions you have with your health care provider.   Document Released: 01/28/2005 Document Revised: 10/19/2014 Document Reviewed: 08/27/2012 Elsevier Interactive Patient Education 2016 [employer]  

## 2015-06-02 NOTE — Progress Notes (Signed)
Patient's here for f/up asthma.  Patient requesting med refill

## 2015-06-02 NOTE — Progress Notes (Signed)
Subjective:  Patient ID: Janet [name], female    DOB: 1970/11/10  Age: 45 y.o. MRN: [id number]  CC: Follow-up and Asthma   HPI Janet [name] is a 45 year old female with a history of type 2 diabetes mellitus (A1c 8.7), hypothyroidism, asthma who was seen 4 days ago for asthma exacerbation. She received IM Solu-Medrol, nebulizer treatments in the clinic and was discharged on Levaquin and prednisone. Last dose of Levaquin is today. She reports significant improvement in symptoms and has no wheezing.  Outpatient Prescriptions Prior to Visit  Medication Sig Dispense Refill  . albuterol (PROVENTIL HFA;VENTOLIN HFA) 108 (90 Base) MCG/ACT inhaler Inhale 1-2 puffs into the lungs every 6 (six) hours as needed for wheezing or shortness of breath. 1 Inhaler 2  . albuterol (PROVENTIL) (2.5 MG/3ML) 0.083% nebulizer solution Take 3 mLs (2.5 mg total) by nebulization every 6 (six) hours as needed for wheezing or shortness of breath. 75 mL 2  . citalopram (CELEXA) 20 MG tablet Take 2 tablets (40 mg total) by mouth daily. 30 tablet 2  . levofloxacin (LEVAQUIN) 750 MG tablet Take 1 tablet (750 mg total) by mouth daily. 5 tablet 0  . lisinopril (ZESTRIL) 2.5 MG tablet Take 1 tablet (2.5 mg total) by mouth daily. 30 tablet 2  . metFORMIN (GLUCOPHAGE) 1000 MG tablet Take 1 tablet (1,000 mg total) by mouth 2 (two) times daily with a meal. 60 tablet 2  . levothyroxine (SYNTHROID, LEVOTHROID) 75 MCG tablet Take 3 tablets (225 mcg total) by mouth daily before breakfast. 90 tablet 2  . guaiFENesin (MUCINEX) 600 MG 12 hr tablet Take 1 tablet (600 mg total) by mouth 2 (two) times daily. (Patient not taking: Reported on 04/12/2015) 20 tablet 0  . predniSONE (DELTASONE) 20 MG tablet Take 1 tablet (20 mg total) by mouth 2 (two) times daily with a meal. (Patient not taking: Reported on 06/02/2015) 6 tablet 0   No facility-administered medications prior to visit.    ROS Review of Systems  Constitutional: Negative  for activity change and appetite change.  HENT: Negative for sinus pressure and sore throat.   Respiratory: Negative for chest tightness, shortness of breath and wheezing.   Cardiovascular: Negative for chest pain and palpitations.  Gastrointestinal: Negative for abdominal pain, constipation and abdominal distention.  Genitourinary: Negative.   Musculoskeletal: Negative.   Psychiatric/Behavioral: Negative for behavioral problems and dysphoric mood.    Objective:  BP 131/78 mmHg  Pulse 60  Temp(Src) 98.4 F (36.9 C) (Oral)  Resp 16  Ht 5\' 9"  (1.753 m)  Wt 358 lb (162.388 kg)  BMI 52.84 kg/m2  SpO2 99%  BP/Weight 06/02/2015 [id number]  Systolic BP [id number]  Diastolic BP 78 83 77  Wt. (Lbs) 358 359.4 368  BMI 52.84 53.05 54.32      Physical Exam  Constitutional: She is oriented to person, place, and time. She appears well-developed and well-nourished.  Cardiovascular: Normal rate, normal heart sounds and intact distal pulses.   No murmur heard. Pulmonary/Chest: Effort normal and breath sounds normal. She has no wheezes. She has no rales. She exhibits no tenderness.  Abdominal: Soft. Bowel sounds are normal. She exhibits no distension and no mass. There is no tenderness.  Musculoskeletal: Normal range of motion.  Neurological: She is alert and oriented to person, place, and time.     Assessment & Plan:   1. Uncontrolled type 2 diabetes mellitus with complication, without long-term current use of insulin (HCC) A1c 8.7-uncontrolled Blood sugars reveal  improvement in control evidenced by a CBG of 96 this morning Complete diabetic visit, fasting labs and diabetic healthcare maintenance the next office visit - Glucose (CBG)  2. Hypothyroidism, unspecified hypothyroidism type Uncontrolled, dose of Synthroid was adjusted not long ago Thyroid function at next visit - levothyroxine (SYNTHROID, LEVOTHROID) 75 MCG tablet; Take 3 tablets (225 mcg total) by mouth daily  before breakfast.  Dispense: 90 tablet; Refill: 2  3. Asthma, moderate persistent, with acute exacerbation Acute exacerbation has resolved. - Fluticasone-Salmeterol (ADVAIR DISKUS) 250-50 MCG/DOSE AEPB; Inhale 1 puff into the lungs 2 (two) times daily.  Dispense: 60 each; Refill: 3   Meds ordered this encounter  Medications  . Fluticasone-Salmeterol (ADVAIR DISKUS) 250-50 MCG/DOSE AEPB    Sig: Inhale 1 puff into the lungs 2 (two) times daily.    Dispense:  60 each    Refill:  3  . levothyroxine (SYNTHROID, LEVOTHROID) 75 MCG tablet    Sig: Take 3 tablets (225 mcg total) by mouth daily before breakfast.    Dispense:  90 tablet    Refill:  2    Discontinue previous dose    Follow-up: Return in about 1 month (around 07/02/2015) for Follow-up on diabetes mellitus and hypothyroidism.   Arnoldo Morale MD

## 2015-06-07 ENCOUNTER — Ambulatory Visit: Payer: Self-pay | Admitting: Family Medicine

## 2015-06-30 ENCOUNTER — Ambulatory Visit: Payer: Self-pay | Admitting: Family Medicine

## 2015-07-05 MED FILL — metFORMIN HCL 1000 MG TABS: 1000 | 30 days supply | Qty: 60 | Fill #1

## 2015-07-05 MED FILL — LEVOTHYROXINE 75 MCG TABLET: 75 | 30 days supply | Qty: 90 | Fill #1

## 2015-07-24 MED FILL — !ADVAIR 250/50 DISKUS: 250-50 | 30 days supply | Qty: 60 | Fill #1

## 2015-07-24 MED FILL — ALBUTEROL 0.083% INHAL SOLN: (2.5 MG/3ML | 8 days supply | Qty: 90 | Fill #2

## 2015-08-01 MED FILL — CITALOPRAM HBR 20 MG TABLET: 20 | 15 days supply | Qty: 30 | Fill #1

## 2015-08-01 MED FILL — ?LEVOTHYROXINE 75 MCG TABLE: 75 | 30 days supply | Qty: 90 | Fill #2

## 2015-08-04 ENCOUNTER — Ambulatory Visit (HOSPITAL_COMMUNITY)
Admission: EM | Admit: 2015-08-04 | Discharge: 2015-08-04 | Disposition: A | Discharge status: Home / Self Care | Payer: Self-pay | Attending: Emergency Medicine | Admitting: Emergency Medicine

## 2015-08-04 ENCOUNTER — Encounter (HOSPITAL_COMMUNITY): Payer: Self-pay | Admitting: Emergency Medicine

## 2015-08-04 DIAGNOSIS — R030 Elevated blood-pressure reading, without diagnosis of hypertension: Secondary | ICD-10-CM

## 2015-08-04 DIAGNOSIS — R21 Rash and other nonspecific skin eruption: Secondary | ICD-10-CM

## 2015-08-04 DIAGNOSIS — L03115 Cellulitis of right lower limb: Secondary | ICD-10-CM

## 2015-08-04 MED ORDER — TRIAMCINOLONE ACETONIDE 0.1 % EX CREA: 1.0000 "application " | TOPICAL_CREAM | Freq: Two times a day (BID) | CUTANEOUS | Status: DC

## 2015-08-04 MED ORDER — DOXYCYCLINE HYCLATE 100 MG PO CAPS: 100.0000 mg | ORAL_CAPSULE | Freq: Two times a day (BID) | ORAL | Status: DC

## 2015-08-04 NOTE — Discharge Instructions (Signed)
Decrease your salt intake. diet and exercise will lower your blood pressure significantly. It is important to keep your blood pressure under good control, as having a elevated for prolonged periods of time significantly increases your risk of stroke, heart attacks, kidney damage, eye damage, and other problems. Keep a log of blood pressures. Measure it at the same time every day if possible. Return immediately to the ER if you start having chest pain, headache, problems seeing, problems talking, problems walking, if you feel like you're about to pass out, if you do pass out, if you have a seizure, or for any other concerns.Marland Kitchen

## 2015-08-04 NOTE — ED Notes (Signed)
The patient presented to the [facility] with a complaint of a rash on both legs x 2 weeks.

## 2015-08-04 NOTE — ED Provider Notes (Signed)
HPI  SUBJECTIVE:  Janet [name] is a 45 y.o. female who presents with an occasionally pruritic rash on her bilateral lower extremities for the past 2 weeks. She reports a painful, erythemtous area on her right lower extremity. States that it started off as a blister/pustule. She has tried Benadryl, calamine lotion. There are no other aggravating or alleviating factors. She denies nausea, vomiting, fevers, rash elsewhere. No new lotions, soaps, detergents. No change in her medications recently. No sensation being bitten at night, blood on the bedclothes in the morning. 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  No known exposure to poison ivy or oak. She does have pets in the home, but they are not known to have fleas. No recent travel. No crusting. She does have a past medical history of diabetes, hypertension, hypothyroidism, MRSA, asthma. No history of eczema. PND: [facility]. LMP: Now. She denies possibility of being pregnant.  Past Medical History  Diagnosis Date  . Thyroid disease   . Depression   . Asthma     Past Surgical History  Procedure Laterality Date  . Carpal tunnel release    . Cesarean section    . Sinus exploration    . Dilation and curettage of uterus      History reviewed. No pertinent family history.  Social History  Substance Use Topics  . Smoking status: Never Smoker   . Smokeless tobacco: None  . Alcohol Use: No    No current facility-administered medications for this encounter.  Current outpatient prescriptions:  .  albuterol (PROVENTIL HFA;VENTOLIN HFA) 108 (90 Base) MCG/ACT inhaler, Inhale 1-2 puffs into the lungs every 6 (six) hours as needed for wheezing or shortness of breath., Disp: 1 Inhaler, Rfl: 2 .  albuterol (PROVENTIL) (2.5 MG/3ML) 0.083% nebulizer solution, Take 3 mLs (2.5 mg total) by nebulization every 6 (six) hours as needed for wheezing or shortness of breath., Disp: 75 mL, Rfl: 2 .  citalopram  (CELEXA) 20 MG tablet, Take 2 tablets (40 mg total) by mouth daily., Disp: 30 tablet, Rfl: 2 .  Fluticasone-Salmeterol (ADVAIR DISKUS) 250-50 MCG/DOSE AEPB, Inhale 1 puff into the lungs 2 (two) times daily., Disp: 60 each, Rfl: 3 .  levothyroxine (SYNTHROID, LEVOTHROID) 75 MCG tablet, Take 3 tablets (225 mcg total) by mouth daily before breakfast., Disp: 90 tablet, Rfl: 2 .  metFORMIN (GLUCOPHAGE) 1000 MG tablet, Take 1 tablet (1,000 mg total) by mouth 2 (two) times daily with a meal., Disp: 60 tablet, Rfl: 2 .  doxycycline (VIBRAMYCIN) 100 MG capsule, Take 1 capsule (100 mg total) by mouth 2 (two) times daily., Disp: 20 capsule, Rfl: 0 .  lisinopril (ZESTRIL) 2.5 MG tablet, Take 1 tablet (2.5 mg total) by mouth daily., Disp: 30 tablet, Rfl: 2 .  triamcinolone cream (KENALOG) 0.1 %, Apply 1 application topically 2 (two) times daily. Apply for 2 weeks. May use on face, Disp: 30 g, Rfl: 0  Allergies  Allergen Reactions  . Ciprofloxacin Shortness Of Breath     ROS  As noted in HPI.   Physical Exam  BP 177/80 mmHg  Pulse 72  Temp(Src) 98.2 F (36.8 C) (Oral)  Resp 20  SpO2 98%  LMP 08/04/2015 (Exact Date)  Constitutional: Well developed, well nourished, no acute distress Eyes:  EOMI, conjunctiva normal bilaterally HENT: Normocephalic, atraumatic,mucus membranes moist Respiratory: Normal inspiratory effort Cardiovascular: Normal rate GI: nondistended skin: Nontender vesicular papular rash on her left lower extremity, no calf tenderness, no petechiae. Scab,  tender, area of erythema right lower extremity suggestive of cellulitis no expressible purulent drainage. Several scattered erythematous papules. See pictures.      Musculoskeletal: no deformities Neurologic: Alert & oriented x 3, no focal neuro deficits Psychiatric: Speech and behavior appropriate   ED Course   Medications - No data to display  No orders of the defined types were placed in this encounter.    No  results found for this or any previous visit (from the past 24 hour(s)). No results found.  ED Clinical Impression  Cellulitis of right lower extremity  Rash  Elevated blood pressure reading  ED Assessment/Plan  Rash of unknown etiology, however, she appears to have a cellulitis. Given history of MRSA, we'll treat with doxycycline. States that she is not taking lisinopril. States that she is not taking any blood pressure medicines.  Blood pressure noted. She is currently symptomatic. No headache, chest pain, shortness of breath, visual changes or leg weakness, facial droop, dysarthria.. Advised her to keep a log of her blood pressures so that her PMD has this information when she sees him at her next appointment. States that her blood pressure normally is 140/80. Discussed signs and symptoms of a hypertensive emergency that should prompt return to the emergency department. Patient agrees with plan.  Discussed  MDM, plan and followup with patient Discussed sn/sx that should prompt return to the ED. Patient  agrees with plan.   *This clinic note was created using Dragon dictation software. Therefore, there may be occasional mistakes despite careful proofreading.  ?   Melynda Ripple, MD 08/04/15 2053

## 2015-08-09 ENCOUNTER — Ambulatory Visit: Payer: Self-pay | Admitting: Family Medicine

## 2015-08-16 MED FILL — CITALOPRAM HBR 20 MG TABLET: 20 | 15 days supply | Qty: 30 | Fill #2

## 2015-08-27 ENCOUNTER — Encounter (HOSPITAL_BASED_OUTPATIENT_CLINIC_OR_DEPARTMENT_OTHER): Payer: Self-pay | Admitting: *Deleted

## 2015-08-27 ENCOUNTER — Emergency Department (HOSPITAL_BASED_OUTPATIENT_CLINIC_OR_DEPARTMENT_OTHER)
Admission: EM | Admit: 2015-08-27 | Discharge: 2015-08-27 | Disposition: A | Discharge status: Home / Self Care | Payer: Self-pay | Attending: Emergency Medicine | Admitting: Emergency Medicine

## 2015-08-27 DIAGNOSIS — K0889 Other specified disorders of teeth and supporting structures: Secondary | ICD-10-CM | POA: Insufficient documentation

## 2015-08-27 DIAGNOSIS — F329 Major depressive disorder, single episode, unspecified: Secondary | ICD-10-CM | POA: Insufficient documentation

## 2015-08-27 DIAGNOSIS — Z79899 Other long term (current) drug therapy: Secondary | ICD-10-CM | POA: Insufficient documentation

## 2015-08-27 DIAGNOSIS — Z6841 Body Mass Index (BMI) 40.0 and over, adult: Secondary | ICD-10-CM | POA: Insufficient documentation

## 2015-08-27 DIAGNOSIS — E119 Type 2 diabetes mellitus without complications: Secondary | ICD-10-CM | POA: Insufficient documentation

## 2015-08-27 DIAGNOSIS — J45909 Unspecified asthma, uncomplicated: Secondary | ICD-10-CM | POA: Insufficient documentation

## 2015-08-27 DIAGNOSIS — Z7984 Long term (current) use of oral hypoglycemic drugs: Secondary | ICD-10-CM | POA: Insufficient documentation

## 2015-08-27 DIAGNOSIS — E669 Obesity, unspecified: Secondary | ICD-10-CM | POA: Insufficient documentation

## 2015-08-27 DIAGNOSIS — J029 Acute pharyngitis, unspecified: Secondary | ICD-10-CM | POA: Insufficient documentation

## 2015-08-27 HISTORY — DX: Type 2 diabetes mellitus without complications: E11.9

## 2015-08-27 LAB — RAPID STREP SCREEN (MED CTR MEBANE ONLY): Streptococcus, Group A Screen (Direct): NEGATIVE

## 2015-08-27 MED ORDER — NAPROXEN 250 MG PO TABS
500.0000 mg | ORAL_TABLET | Freq: Once | ORAL | Status: AC
  Administered 2015-08-27: 500 mg via ORAL
  Filled 2015-08-27: qty 2

## 2015-08-27 MED ORDER — PENICILLIN V POTASSIUM 500 MG PO TABS: 500.0000 mg | ORAL_TABLET | Freq: Four times a day (QID) | ORAL | Status: AC

## 2015-08-27 MED ORDER — IBUPROFEN 800 MG PO TABS: 800.0000 mg | ORAL_TABLET | Freq: Three times a day (TID) | ORAL | Status: DC

## 2015-08-27 NOTE — ED Notes (Signed)
Pt reports sore throat x1.5 wks(post cold/allergy illness). Denies sob, n/v/d, fever (endorses chills and sweats at night). Pt also reports L lower tooth pain. Looks as if an old filling could be breaking off. Pt has tried Orajel, Motrin and Tramadol that has brought temporary relief.

## 2015-08-27 NOTE — ED Provider Notes (Signed)
CSN: [id number]     Arrival date & time 08/27/15  [id number] History   First MD Initiated Contact with Patient 08/27/15 0757     Chief Complaint  Patient presents with  . Sore Throat  . Dental Pain     (Consider location/radiation/quality/duration/timing/severity/associated sxs/prior Treatment) HPI Comments: Patient reports sore throat for the past 10 days after having an upper respiratory infection with rhinorrhea and cough which hasn't. States the left side of her throat is sore and worse with swallowing. Denies any fever but has had some chills. No nausea vomiting or diarrhea. No shortness of breath. No chest pain. History of asthma which is well controlled with Advair. Denies any wheezing or shortness of breath currently. She also complains of left lower molar pain where she has a broken filling. Taking her husband's tramadol without relief. Denies any difficulty swallowing has pain with swallowing from her sore throat. Denies any headache. Has a oral surgery appointment in 5 days.  Patient is a 45 y.o. female presenting with pharyngitis and tooth pain. The history is provided by the patient.  Sore Throat Pertinent negatives include no chest pain, no headaches and no shortness of breath.  Dental Pain Associated symptoms: congestion   Associated symptoms: no fever and no headaches     Past Medical History  Diagnosis Date  . Thyroid disease   . Depression   . Asthma   . Diabetes mellitus without complication [facility])    Past Surgical History  Procedure Laterality Date  . Carpal tunnel release    . Cesarean section    . Sinus exploration    . Dilation and curettage of uterus    . Cholecystectomy     No family history on file. Social History  Substance Use Topics  . Smoking status: Never Smoker   . Smokeless tobacco: Never Used  . Alcohol Use: No   OB History    No data available     Review of Systems  Constitutional: Positive for chills. Negative for fever, activity change and  appetite change.  HENT: Positive for congestion, dental problem, postnasal drip, rhinorrhea and sore throat. Negative for trouble swallowing.   Eyes: Negative for visual disturbance.  Respiratory: Negative for cough and shortness of breath.   Cardiovascular: Negative for chest pain.  Gastrointestinal: Negative for nausea and vomiting.  Genitourinary: Negative for dysuria, vaginal bleeding and vaginal discharge.  Musculoskeletal: Negative for myalgias and arthralgias.  Skin: Negative for rash.  Neurological: Negative for dizziness, weakness, light-headedness and headaches.  A complete 10 system review of systems was obtained and all systems are negative except as noted in the HPI and PMH.      Allergies  Ciprofloxacin  Home Medications   Prior to Admission medications   Medication Sig Start Date End Date Taking? Authorizing Provider  albuterol (PROVENTIL HFA;VENTOLIN HFA) 108 (90 Base) MCG/ACT inhaler Inhale 1-2 puffs into the lungs every 6 (six) hours as needed for wheezing or shortness of breath. 04/12/15  Yes Arnoldo Morale, MD  albuterol (PROVENTIL) (2.5 MG/3ML) 0.083% nebulizer solution Take 3 mLs (2.5 mg total) by nebulization every 6 (six) hours as needed for wheezing or shortness of breath. 04/12/15  Yes Arnoldo Morale, MD  citalopram (CELEXA) 20 MG tablet Take 2 tablets (40 mg total) by mouth daily. 04/12/15  Yes Arnoldo Morale, MD  Fluticasone-Salmeterol (ADVAIR DISKUS) 250-50 MCG/DOSE AEPB Inhale 1 puff into the lungs 2 (two) times daily. 06/02/15  Yes Arnoldo Morale, MD  levothyroxine (SYNTHROID, LEVOTHROID) 75 MCG tablet  Take 3 tablets (225 mcg total) by mouth daily before breakfast. 06/02/15  Yes Arnoldo Morale, MD  metFORMIN (GLUCOPHAGE) 1000 MG tablet Take 1 tablet (1,000 mg total) by mouth 2 (two) times daily with a meal. 04/12/15  Yes Arnoldo Morale, MD  ibuprofen (ADVIL,MOTRIN) 800 MG tablet Take 1 tablet (800 mg total) by mouth 3 (three) times daily. 08/27/15   Ezequiel Essex, MD   penicillin v potassium (VEETID) 500 MG tablet Take 1 tablet (500 mg total) by mouth 4 (four) times daily. 08/27/15 09/03/15  Ezequiel Essex, MD   BP 136/56 mmHg  Pulse 80  Temp(Src) 98.2 F (36.8 C) (Oral)  Resp 22  Ht 5\' 9"  (1.753 m)  Wt 347 lb (157.398 kg)  BMI 51.22 kg/m2  SpO2 98%  LMP 08/09/2015 (Approximate) Physical Exam  Constitutional: She is oriented to person, place, and time. She appears well-developed and well-nourished. No distress.  obese  HENT:  Head: Normocephalic and atraumatic.  Mouth/Throat: Oropharynx is clear and moist. No oropharyngeal exudate.  TTP L lower molar. No abscess. Floor of mouth soft.   Mild erythema to oropharynx.  Uvula midline. No asymmetry  Eyes: Conjunctivae and EOM are normal. Pupils are equal, round, and reactive to light.  Neck: Normal range of motion. Neck supple.  No meningismus.  Cardiovascular: Normal rate, regular rhythm, normal heart sounds and intact distal pulses.   No murmur heard. Pulmonary/Chest: Effort normal. No respiratory distress. She has wheezes.  Few scattered expiratory wheezes  Abdominal: Soft. There is no tenderness. There is no rebound and no guarding.  obese  Musculoskeletal: Normal range of motion. She exhibits no edema or tenderness.  Neurological: She is alert and oriented to person, place, and time. No cranial nerve deficit. She exhibits normal muscle tone. Coordination normal.  No ataxia on finger to nose bilaterally. No pronator drift. 5/5 strength throughout. CN 2-12 intact.Equal grip strength. Sensation intact.   Skin: Skin is warm.  Psychiatric: She has a normal mood and affect. Her behavior is normal.  Nursing note and vitals reviewed.   ED Course  Procedures (including critical care time) Labs Review Labs Reviewed  RAPID STREP SCREEN (NOT AT [facility])  CULTURE, GROUP A STREP [facility])    Imaging Review No results found. I have personally reviewed and evaluated these images and lab results as part of  my medical decision-making.   EKG Interpretation None      MDM   Final diagnoses:  Pharyngitis  Pain, dental   Sore throat for 10 days with left lower dental pain for several days. No difficulty breathing or swallowing. No evidence of Ludwig's angina.  Patient declines breathing treatment. Very few scattered wheezes. Normal respiratory effort and normal pulse oximetry.  Patient requesting narcotics. She drove herself to the ED. Furthermore, discussed that narcotics are not indicated for dental pain. There is no evidence of abscess or Ludwig angina.  Patient has follow-up with her dentist later this week. We'll start anti-inflammatories and antibiotics. Return precautions discussed. Advised to stop taking her husband's tramadol as this could interfere with her SSRI and increase her risk for serotonin syndrome or seizures.  Ezequiel Essex, MD 08/27/15 1004

## 2015-08-27 NOTE — Discharge Instructions (Signed)
Pharyngitis Follow up with your dentist. Return to the ED if you develop worsening pain, difficulty breathing or swallowing or any other concerns. Pharyngitis is redness, pain, and swelling (inflammation) of your pharynx.  CAUSES  Pharyngitis is usually caused by infection. Most of the time, these infections are from viruses (viral) and are part of a cold. However, sometimes pharyngitis is caused by bacteria (bacterial). Pharyngitis can also be caused by allergies. Viral pharyngitis may be spread from person to person by coughing, sneezing, and personal items or utensils (cups, forks, spoons, toothbrushes). Bacterial pharyngitis may be spread from person to person by more intimate contact, such as kissing.  SIGNS AND SYMPTOMS  Symptoms of pharyngitis include:   Sore throat.   Tiredness (fatigue).   Low-grade fever.   Headache.  Joint pain and muscle aches.  Skin rashes.  Swollen lymph nodes.  Plaque-like film on throat or tonsils (often seen with bacterial pharyngitis). DIAGNOSIS  Your health care provider will ask you questions about your illness and your symptoms. Your medical history, along with a physical exam, is often all that is needed to diagnose pharyngitis. Sometimes, a rapid strep test is done. Other lab tests may also be done, depending on the suspected cause.  TREATMENT  Viral pharyngitis will usually get better in 3-4 days without the use of medicine. Bacterial pharyngitis is treated with medicines that kill germs (antibiotics).  HOME CARE INSTRUCTIONS   Drink enough water and fluids to keep your urine clear or pale yellow.   Only take over-the-counter or prescription medicines as directed by your health care provider:   If you are prescribed antibiotics, make sure you finish them even if you start to feel better.   Do not take aspirin.   Get lots of rest.   Gargle with 8 oz of salt water ( tsp of salt per 1 qt of water) as often as every 1-2 hours to  soothe your throat.   Throat lozenges (if you are not at risk for choking) or sprays may be used to soothe your throat. SEEK MEDICAL CARE IF:   You have large, tender lumps in your neck.  You have a rash.  You cough up green, yellow-brown, or bloody spit. SEEK IMMEDIATE MEDICAL CARE IF:   Your neck becomes stiff.  You drool or are unable to swallow liquids.  You vomit or are unable to keep medicines or liquids down.  You have severe pain that does not go away with the use of recommended medicines.  You have trouble breathing (not caused by a stuffy nose). MAKE SURE YOU:   Understand these instructions.  Will watch your condition.  Will get help right away if you are not doing well or get worse.   This information is not intended to replace advice given to you by your health care provider. Make sure you discuss any questions you have with your health care provider.   Document Released: 01/28/2005 Document Revised: 11/18/2012 Document Reviewed: 10/05/2012 Elsevier Interactive Patient Education [employer].

## 2015-08-30 LAB — CULTURE, GROUP A STREP (THRC)

## 2015-09-01 ENCOUNTER — Encounter: Payer: Self-pay | Admitting: Family Medicine

## 2015-09-01 ENCOUNTER — Ambulatory Visit: Payer: Self-pay | Attending: Family Medicine | Admitting: Family Medicine

## 2015-09-01 VITALS — BP 131/70 | HR 67 | Temp 98.1°F | Resp 20 | Wt 342.0 lb

## 2015-09-01 DIAGNOSIS — R1310 Dysphagia, unspecified: Secondary | ICD-10-CM

## 2015-09-01 DIAGNOSIS — F32A Depression, unspecified: Secondary | ICD-10-CM

## 2015-09-01 DIAGNOSIS — E039 Hypothyroidism, unspecified: Secondary | ICD-10-CM

## 2015-09-01 DIAGNOSIS — F329 Major depressive disorder, single episode, unspecified: Secondary | ICD-10-CM

## 2015-09-01 DIAGNOSIS — J4541 Moderate persistent asthma with (acute) exacerbation: Secondary | ICD-10-CM

## 2015-09-01 DIAGNOSIS — R21 Rash and other nonspecific skin eruption: Secondary | ICD-10-CM

## 2015-09-01 DIAGNOSIS — E119 Type 2 diabetes mellitus without complications: Secondary | ICD-10-CM

## 2015-09-01 LAB — POCT GLYCOSYLATED HEMOGLOBIN (HGB A1C): HEMOGLOBIN A1C: 5.7

## 2015-09-01 LAB — GLUCOSE, POCT (MANUAL RESULT ENTRY): POC GLUCOSE: 82 mg/dL (ref 70–99)

## 2015-09-01 MED ORDER — CITALOPRAM HYDROBROMIDE 40 MG PO TABS: 40.0000 mg | ORAL_TABLET | Freq: Every day | ORAL | Status: DC

## 2015-09-01 MED ORDER — METFORMIN HCL 1000 MG PO TABS: 1000.0000 mg | ORAL_TABLET | Freq: Two times a day (BID) | ORAL | Status: DC

## 2015-09-01 MED ORDER — ALBUTEROL SULFATE (2.5 MG/3ML) 0.083% IN NEBU: 2.5000 mg | INHALATION_SOLUTION | Freq: Four times a day (QID) | RESPIRATORY_TRACT | Status: DC | PRN

## 2015-09-01 MED ORDER — ALBUTEROL SULFATE HFA 108 (90 BASE) MCG/ACT IN AERS: 1.0000 | INHALATION_SPRAY | Freq: Four times a day (QID) | RESPIRATORY_TRACT | Status: DC | PRN

## 2015-09-01 MED ORDER — FLUTICASONE-SALMETEROL 250-50 MCG/DOSE IN AEPB: 1.0000 | INHALATION_SPRAY | Freq: Two times a day (BID) | RESPIRATORY_TRACT | Status: DC

## 2015-09-01 MED ORDER — LEVOTHYROXINE SODIUM 75 MCG PO TABS: 225.0000 ug | ORAL_TABLET | Freq: Every day | ORAL | Status: DC

## 2015-09-01 MED ORDER — CEPHALEXIN 500 MG PO CAPS: 500.0000 mg | ORAL_CAPSULE | Freq: Two times a day (BID) | ORAL | Status: DC

## 2015-09-01 MED FILL — !VENTOLIN HFA INHALER: 108 (90 BAS | 20 days supply | Qty: 18 | Fill #0

## 2015-09-01 MED FILL — ?CITALOPRAM HBR 40 MG TABLE: 40 | 30 days supply | Qty: 30 | Fill #0

## 2015-09-01 MED FILL — ?ALBUTEROL SUL 2.5 MG/3 MLS: (2.5 MG/3ML | 7 days supply | Qty: 90 | Fill #0

## 2015-09-01 MED FILL — CEPHALEXIN 500 MG CAPSULE: 500 | 7 days supply | Qty: 14 | Fill #0

## 2015-09-01 MED FILL — ?METFORMIN HCL 1,000 MG TAB: 1000 | 30 days supply | Qty: 60 | Fill #0

## 2015-09-01 MED FILL — LEVOTHYROXINE 75 MCG TABLET: 75 | 30 days supply | Qty: 90 | Fill #0

## 2015-09-01 MED FILL — **ADVAIR 250/50 DISKUS: 250-50 MCG | 20 days supply | Qty: 60 | Fill #0

## 2015-09-01 NOTE — Progress Notes (Signed)
Dysphagia at night/pain for 2 weeks. Stopped for 3 days then came back.

## 2015-09-01 NOTE — Patient Instructions (Signed)
Diabetes Mellitus and Food It is important for you to manage your blood sugar (glucose) level. Your blood glucose level can be greatly affected by what you eat. Eating healthier foods in the appropriate amounts throughout the day at about the same time each day will help you control your blood glucose level. It can also help slow or prevent worsening of your diabetes mellitus. Healthy eating may even help you improve the level of your blood pressure and reach or maintain a healthy weight.  General recommendations for healthful eating and cooking habits include:  Eating meals and snacks regularly. Avoid going long periods of time without eating to lose weight.  Eating a diet that consists mainly of plant-based foods, such as fruits, vegetables, nuts, legumes, and whole grains.  Using low-heat cooking methods, such as baking, instead of high-heat cooking methods, such as deep frying. Work with your dietitian to make sure you understand how to use the Nutrition Facts information on food labels. HOW CAN FOOD AFFECT ME? Carbohydrates Carbohydrates affect your blood glucose level more than any other type of food. Your dietitian will help you determine how many carbohydrates to eat at each meal and teach you how to count carbohydrates. Counting carbohydrates is important to keep your blood glucose at a healthy level, especially if you are using insulin or taking certain medicines for diabetes mellitus. Alcohol Alcohol can cause sudden decreases in blood glucose (hypoglycemia), especially if you use insulin or take certain medicines for diabetes mellitus. Hypoglycemia can be a life-threatening condition. Symptoms of hypoglycemia (sleepiness, dizziness, and disorientation) are similar to symptoms of having too much alcohol.  If your health care provider has given you approval to drink alcohol, do so in moderation and use the following guidelines:  Women should not have more than one drink per day, and men  should not have more than two drinks per day. One drink is equal to:  12 oz of beer.  5 oz of wine.  1 oz of hard liquor.  Do not drink on an empty stomach.  Keep yourself hydrated. Have water, diet soda, or unsweetened iced tea.  Regular soda, juice, and other mixers might contain a lot of carbohydrates and should be counted. WHAT FOODS ARE NOT RECOMMENDED? As you make food choices, it is important to remember that all foods are not the same. Some foods have fewer nutrients per serving than other foods, even though they might have the same number of calories or carbohydrates. It is difficult to get your body what it needs when you eat foods with fewer nutrients. Examples of foods that you should avoid that are high in calories and carbohydrates but low in nutrients include:  Trans fats (most processed foods list trans fats on the Nutrition Facts label).  Regular soda.  Juice.  Candy.  Sweets, such as cake, pie, doughnuts, and cookies.  Fried foods. WHAT FOODS CAN I EAT? Eat nutrient-rich foods, which will nourish your body and keep you healthy. The food you should eat also will depend on several factors, including:  The calories you need.  The medicines you take.  Your weight.  Your blood glucose level.  Your blood pressure level.  Your cholesterol level. You should eat a variety of foods, including:  Protein.  Lean cuts of meat.  Proteins low in saturated fats, such as fish, egg whites, and beans. Avoid processed meats.  Fruits and vegetables.  Fruits and vegetables that may help control blood glucose levels, such as apples, mangoes, and   yams.  Dairy products.  Choose fat-free or low-fat dairy products, such as milk, yogurt, and cheese.  Grains, bread, pasta, and rice.  Choose whole grain products, such as multigrain bread, whole oats, and brown rice. These foods may help control blood pressure.  Fats.  Foods containing healthful fats, such as nuts,  avocado, olive oil, canola oil, and fish. DOES EVERYONE WITH DIABETES MELLITUS HAVE THE SAME MEAL PLAN? Because every person with diabetes mellitus is different, there is not one meal plan that works for everyone. It is very important that you meet with a dietitian who will help you create a meal plan that is just right for you.   This information is not intended to replace advice given to you by your health care provider. Make sure you discuss any questions you have with your health care provider.   Document Released: 10/25/2004 Document Revised: 02/18/2014 Document Reviewed: 12/25/2012 Elsevier Interactive Patient Education 2016 [employer] Inc[employer]  

## 2015-09-01 NOTE — Progress Notes (Signed)
Subjective:  Patient ID: Janet [name], female    DOB: 1970/12/13  Age: 45 y.o. MRN: [id number]  CC: Diabetes   HPI Janet [name] is a 45 year old female with history of type 2 diabetes mellitus (A1c 5.7), obesity, asthma, hypothyroidism who is in the clinic today to follow-up on her chronic medical conditions.  She has been compliant with a diabetic diet and her diabetic medications but has not been exercising. Denies hypoglycemia, blurry vision, numbness in extremities.  Also takes her levothyroxine as prescribed and has no constipation, diarrhea.  She is concerned about a three-week history of erythematous rash on both shins for which she was seen at urgent care and prescribed "a cream for staph infection"which did not help her symptoms. She denies fever; of note she has had a history of MRSA in the past. There are no other lesions in other parts of her body.  Complains of dysphagia which has been intermittent but denies sore throat or postnasal drip. Dysphagia has been on for the last 3 weeks.   Past Medical History  Diagnosis Date  . Thyroid disease   . Depression   . Asthma   . Diabetes mellitus without complication [facility])     Past Surgical History  Procedure Laterality Date  . Carpal tunnel release    . Cesarean section    . Sinus exploration    . Dilation and curettage of uterus    . Cholecystectomy      Allergies  Allergen Reactions  . Ciprofloxacin Shortness Of Breath     Outpatient Prescriptions Prior to Visit  Medication Sig Dispense Refill  . ibuprofen (ADVIL,MOTRIN) 800 MG tablet Take 1 tablet (800 mg total) by mouth 3 (three) times daily. 21 tablet 0  . albuterol (PROVENTIL) (2.5 MG/3ML) 0.083% nebulizer solution Take 3 mLs (2.5 mg total) by nebulization every 6 (six) hours as needed for wheezing or shortness of breath. 75 mL 2  . citalopram (CELEXA) 20 MG tablet Take 2 tablets (40 mg total) by mouth daily. 30 tablet 2  . Fluticasone-Salmeterol  (ADVAIR DISKUS) 250-50 MCG/DOSE AEPB Inhale 1 puff into the lungs 2 (two) times daily. 60 each 3  . levothyroxine (SYNTHROID, LEVOTHROID) 75 MCG tablet Take 3 tablets (225 mcg total) by mouth daily before breakfast. 90 tablet 2  . metFORMIN (GLUCOPHAGE) 1000 MG tablet Take 1 tablet (1,000 mg total) by mouth 2 (two) times daily with a meal. 60 tablet 2  . penicillin v potassium (VEETID) 500 MG tablet Take 1 tablet (500 mg total) by mouth 4 (four) times daily. 40 tablet 0  . albuterol (PROVENTIL HFA;VENTOLIN HFA) 108 (90 Base) MCG/ACT inhaler Inhale 1-2 puffs into the lungs every 6 (six) hours as needed for wheezing or shortness of breath. 1 Inhaler 2   No facility-administered medications prior to visit.    ROS Review of Systems  Constitutional: Negative for activity change, appetite change and fatigue.  HENT: Negative for congestion, sinus pressure and sore throat.   Eyes: Negative for visual disturbance.  Respiratory: Negative for cough, chest tightness, shortness of breath and wheezing.   Cardiovascular: Negative for chest pain and palpitations.  Gastrointestinal: Negative for abdominal pain, constipation and abdominal distention.  Endocrine: Negative for polydipsia.  Genitourinary: Negative for dysuria and frequency.  Musculoskeletal: Negative for back pain and arthralgias.  Skin: Positive for rash.  Neurological: Negative for tremors, light-headedness and numbness.  Hematological: Does not bruise/bleed easily.  Psychiatric/Behavioral: Negative for behavioral problems and agitation.    Objective:  BP  131/70 mmHg  Pulse 67  Temp(Src) 98.1 F (36.7 C) (Oral)  Resp 20  Wt 342 lb (155.13 kg)  SpO2 97%  LMP 08/09/2015 (Approximate)  BP/Weight 09/01/2015 08/27/2015 [id number]  Systolic BP [id number]  Diastolic BP 70 56 80  Wt. (Lbs) 342 347 -  BMI 50.48 51.22 -      Physical Exam Constitutional: Morbidly obese She is oriented to person, place, and time. She appears  well-developed and well-nourished.  Cardiovascular: Normal rate, normal heart sounds and intact distal pulses.   No murmur heard. Pulmonary/Chest: Effort normal and breath sounds normal. She has no wheezes. She has no rales. She exhibits no tenderness.  Abdominal: Soft. Bowel sounds are normal. She exhibits no distension and no mass. There is no tenderness.  Musculoskeletal: Normal range of motion.  Neurological: She is alert and oriented to person, place, and time.  Skin: Erythematous pinpoint rash both shin Assessment & Plan:   1. Type 2 diabetes mellitus without complication, without long-term current use of insulin (HCC) Controlled with A1c of 5.7 - Glucose (CBG) - HgB A1c - metFORMIN (GLUCOPHAGE) 1000 MG tablet; Take 1 tablet (1,000 mg total) by mouth 2 (two) times daily with a meal.  Dispense: 60 tablet; Refill: 3 - COMPLETE METABOLIC PANEL WITH GFR; Future - Lipid panel; Future - Microalbumin / creatinine urine ratio; Future  2. Depression Stable - citalopram (CELEXA) 40 MG tablet; Take 1 tablet (40 mg total) by mouth daily.  Dispense: 30 tablet; Refill: 3   3. Asthma, moderate persistent, with acute exacerbation Fluticasone-Salmeterol (ADVAIR DISKUS) 250-50 MCG/DOSE AEPB; Inhale 1 puff into the lungs 2 (two) times daily.  Dispense: 60 each; Refill: 3 - albuterol (PROVENTIL) (2.5 MG/3ML) 0.083% nebulizer solution; Take 3 mLs (2.5 mg total) by nebulization every 6 (six) hours as needed for wheezing or shortness of breath.  Dispense: 75 mL; Refill: 2 - albuterol (PROVENTIL HFA;VENTOLIN HFA) 108 (90 Base) MCG/ACT inhaler; Inhale 1-2 puffs into the lungs every 6 (six) hours as needed for wheezing or shortness of breath.  Dispense: 1 Inhaler; Refill: 3    4. Hypothyroidism, unspecified hypothyroidism type Controlled - levothyroxine (SYNTHROID, LEVOTHROID) 75 MCG tablet; Take 3 tablets (225 mcg total) by mouth daily before breakfast.  Dispense: 90 tablet; Refill: 3 - TSH;  Future  5. Rash and nonspecific skin eruption We'll treat presumptively for staph infection - cephALEXin (KEFLEX) 500 MG capsule; Take 1 capsule (500 mg total) by mouth 2 (two) times daily.  Dispense: 14 capsule; Refill: 0  6. Dysphagia Advised to obtain Chloraseptic OTC   Meds ordered this encounter  Medications  . metFORMIN (GLUCOPHAGE) 1000 MG tablet    Sig: Take 1 tablet (1,000 mg total) by mouth 2 (two) times daily with a meal.    Dispense:  60 tablet    Refill:  3  . citalopram (CELEXA) 40 MG tablet    Sig: Take 1 tablet (40 mg total) by mouth daily.    Dispense:  30 tablet    Refill:  3  . Fluticasone-Salmeterol (ADVAIR DISKUS) 250-50 MCG/DOSE AEPB    Sig: Inhale 1 puff into the lungs 2 (two) times daily.    Dispense:  60 each    Refill:  3  . albuterol (PROVENTIL) (2.5 MG/3ML) 0.083% nebulizer solution    Sig: Take 3 mLs (2.5 mg total) by nebulization every 6 (six) hours as needed for wheezing or shortness of breath.    Dispense:  75 mL    Refill:  2  .  albuterol (PROVENTIL HFA;VENTOLIN HFA) 108 (90 Base) MCG/ACT inhaler    Sig: Inhale 1-2 puffs into the lungs every 6 (six) hours as needed for wheezing or shortness of breath.    Dispense:  1 Inhaler    Refill:  3  . levothyroxine (SYNTHROID, LEVOTHROID) 75 MCG tablet    Sig: Take 3 tablets (225 mcg total) by mouth daily before breakfast.    Dispense:  90 tablet    Refill:  3    Discontinue previous dose  . cephALEXin (KEFLEX) 500 MG capsule    Sig: Take 1 capsule (500 mg total) by mouth 2 (two) times daily.    Dispense:  14 capsule    Refill:  0    Follow-up: Return in about 3 months (around 12/02/2015) for Follow-up on diabetes mellitus.   Arnoldo Morale MD

## 2015-10-05 MED FILL — ?CITALOPRAM HBR 40 MG TABLE: 40 | 30 days supply | Qty: 30 | Fill #1

## 2015-10-05 MED FILL — LEVOTHYROXINE 75 MCG TABLET: 75 | 30 days supply | Qty: 90 | Fill #1

## 2015-11-08 MED FILL — ?CITALOPRAM HBR 40 MG TABLE: 40 | 30 days supply | Qty: 30 | Fill #2

## 2015-11-08 MED FILL — LEVOTHYROXINE 75 MCG TABLET: 75 | 30 days supply | Qty: 90 | Fill #2

## 2015-11-09 MED FILL — **ADVAIR 250/50 DISKUS: 250-50 MCG | 20 days supply | Qty: 60 | Fill #1

## 2015-11-22 ENCOUNTER — Encounter (HOSPITAL_BASED_OUTPATIENT_CLINIC_OR_DEPARTMENT_OTHER): Payer: Self-pay | Admitting: *Deleted

## 2015-11-22 ENCOUNTER — Emergency Department (HOSPITAL_BASED_OUTPATIENT_CLINIC_OR_DEPARTMENT_OTHER)
Admission: EM | Admit: 2015-11-22 | Discharge: 2015-11-22 | Disposition: A | Discharge status: Home / Self Care | Payer: Self-pay | Attending: Emergency Medicine | Admitting: Emergency Medicine

## 2015-11-22 DIAGNOSIS — M5442 Lumbago with sciatica, left side: Secondary | ICD-10-CM | POA: Insufficient documentation

## 2015-11-22 DIAGNOSIS — E119 Type 2 diabetes mellitus without complications: Secondary | ICD-10-CM | POA: Insufficient documentation

## 2015-11-22 DIAGNOSIS — Z7984 Long term (current) use of oral hypoglycemic drugs: Secondary | ICD-10-CM | POA: Insufficient documentation

## 2015-11-22 DIAGNOSIS — Z7951 Long term (current) use of inhaled steroids: Secondary | ICD-10-CM | POA: Insufficient documentation

## 2015-11-22 DIAGNOSIS — J45909 Unspecified asthma, uncomplicated: Secondary | ICD-10-CM | POA: Insufficient documentation

## 2015-11-22 LAB — URINALYSIS, ROUTINE W REFLEX MICROSCOPIC
BILIRUBIN URINE: NEGATIVE
Glucose, UA: NEGATIVE mg/dL
Hgb urine dipstick: NEGATIVE
KETONES UR: NEGATIVE mg/dL
Leukocytes, UA: NEGATIVE
NITRITE: NEGATIVE
Protein, ur: NEGATIVE mg/dL
Specific Gravity, Urine: 1.013 (ref 1.005–1.030)
pH: 6 (ref 5.0–8.0)

## 2015-11-22 MED ORDER — IBUPROFEN 800 MG PO TABS
800.0000 mg | ORAL_TABLET | Freq: Once | ORAL | Status: AC
Start: 2015-11-22 — End: 2015-11-22
  Administered 2015-11-22: 800 mg via ORAL
  Filled 2015-11-22: qty 1

## 2015-11-22 NOTE — ED Triage Notes (Signed)
Pt states she rolled over in bed last night, and had sudden onset of pain to her left buttock area, radiating down left leg.

## 2015-11-22 NOTE — Discharge Instructions (Signed)
Please read attached information. If you experience any new or worsening signs or symptoms please return to the emergency room for evaluation. Please follow-up with your primary care provider or specialist as discussed. Please use medication prescribed only as directed and discontinue taking if you have any concerning signs or symptoms.   °

## 2015-11-22 NOTE — ED Provider Notes (Signed)
[facility] DEPT [facility] Provider Note   CSN: [id number] Arrival date & time: 11/22/15  [id number]     History   Chief Complaint Chief Complaint  Patient presents with  . Back Pain    HPI Janet [name] is a 45 y.o. female.  HPI    45 year old female presents today with complaints of back pain. Patient reports she was lying in bed when she rolled over felt a sharp pain in her lower left back and buttock region with radiation down into her leg. Patient reports symptoms have been persistent since the onset, sharp in nature. She reports initially she had some numbness in the left foot which has now since resolved. She denies any loss of distal strength, notes that lifting the left leg causes significant left lower back pain. She denies any back pain red flags. Patient reports ambulation is difficult due to pain. She denies any history of same, reports using ibuprofen for pain with no significant improvement in symptoms.  Past Medical History:  Diagnosis Date  . Asthma   . Depression   . Diabetes mellitus without complication ([place])   . Thyroid disease     Patient Active Problem List   Diagnosis Date Noted  . Morbid obesity ([place]) 04/12/2015  . Depression 04/12/2015  . Asthma 03/30/2015  . Hypoxemia 03/30/2015  . Hypothyroidism 03/30/2015  . Diabetes mellitus type 2, uncontrolled ([place]) 03/30/2015  . Thrombocytopenia ([place]) 03/30/2015  . Hypokalemia 03/30/2015    Past Surgical History:  Procedure Laterality Date  . CARPAL TUNNEL RELEASE    . CESAREAN SECTION    . CHOLECYSTECTOMY    . DILATION AND CURETTAGE OF UTERUS    . SINUS EXPLORATION      OB History    No data available       Home Medications    Prior to Admission medications   Medication Sig Start Date End Date Taking? Authorizing Provider  albuterol (PROVENTIL HFA;VENTOLIN HFA) 108 (90 Base) MCG/ACT inhaler Inhale 1-2 puffs into the lungs every 6 (six) hours as needed for wheezing or shortness of breath.  09/01/15   Arnoldo Morale, MD  albuterol (PROVENTIL) (2.5 MG/3ML) 0.083% nebulizer solution Take 3 mLs (2.5 mg total) by nebulization every 6 (six) hours as needed for wheezing or shortness of breath. 09/01/15   Arnoldo Morale, MD  cephALEXin (KEFLEX) 500 MG capsule Take 1 capsule (500 mg total) by mouth 2 (two) times daily. 09/01/15   Arnoldo Morale, MD  citalopram (CELEXA) 40 MG tablet Take 1 tablet (40 mg total) by mouth daily. 09/01/15   Arnoldo Morale, MD  Fluticasone-Salmeterol (ADVAIR DISKUS) 250-50 MCG/DOSE AEPB Inhale 1 puff into the lungs 2 (two) times daily. 09/01/15   Arnoldo Morale, MD  ibuprofen (ADVIL,MOTRIN) 800 MG tablet Take 1 tablet (800 mg total) by mouth 3 (three) times daily. 08/27/15   Ezequiel Essex, MD  levothyroxine (SYNTHROID, LEVOTHROID) 75 MCG tablet Take 3 tablets (225 mcg total) by mouth daily before breakfast. 09/01/15   Arnoldo Morale, MD  metFORMIN (GLUCOPHAGE) 1000 MG tablet Take 1 tablet (1,000 mg total) by mouth 2 (two) times daily with a meal. 09/01/15   Arnoldo Morale, MD    Family History History reviewed. No pertinent family history.  Social History Social History  Substance Use Topics  . Smoking status: Never Smoker  . Smokeless tobacco: Never Used  . Alcohol use No     Allergies   Ciprofloxacin   Review of Systems Review of Systems  All other systems reviewed and are  negative.    Physical Exam Updated Vital Signs BP 130/59 (BP Location: Right Arm)   Pulse 72   Temp 98.2 F (36.8 C) (Oral)   Resp 18   Ht 5\' 9"  (1.753 m)   Wt (!) 158.8 kg   LMP 11/03/2015   SpO2 93%   BMI 51.69 kg/m   Physical Exam  Constitutional: She is oriented to person, place, and time. She appears well-developed and well-nourished. No distress.  HENT:  Head: Normocephalic.  Neck: Normal range of motion. Neck supple.  Pulmonary/Chest: Effort normal.  Musculoskeletal: Normal range of motion. She exhibits tenderness. She exhibits no edema.  No C, T, or L spine tenderness to  palpation. No obvious signs of trauma, deformity, infection, step-offs. Lung expansion normal. No scoliosis or kyphosis. Bilateral lower extremity strength 5 out of 5, sensation grossly intact  Tenderness to palpation of left lower lumbar soft tissue and upper gluteus  Straight leg positive left   Neurological: She is alert and oriented to person, place, and time.  Skin: Skin is warm and dry. She is not diaphoretic.  Psychiatric: She has a normal mood and affect. Her behavior is normal. Judgment and thought content normal.  Nursing note and vitals reviewed.    ED Treatments / Results  Labs (all labs ordered are listed, but only abnormal results are displayed) Labs Reviewed  URINALYSIS, ROUTINE W REFLEX MICROSCOPIC (NOT AT [facility]) - Abnormal; Notable for the following:       Result Value   APPearance CLOUDY (*)    All other components within normal limits    EKG  EKG Interpretation None       Radiology No results found.  Procedures Procedures (including critical care time)  Medications Ordered in ED Medications  ibuprofen (ADVIL,MOTRIN) tablet 800 mg (not administered)     Initial Impression / Assessment and Plan / ED Course  I have reviewed the triage vital signs and the nursing notes.  Pertinent labs & imaging results that were available during my care of the patient were reviewed by me and considered in my medical decision making (see chart for details).  Clinical Course     Final Clinical Impressions(s) / ED Diagnoses   Final diagnoses:  Acute left-sided low back pain with left-sided sciatica    Labs:  Imaging:  Consults:  Therapeutics:  Discharge Meds:   Assessment/Plan:  45 year old female presents today with acute back pain. She has no red flags, will be given ibuprofen here and discharged home with symptomatic care instructions and strict return precautions. She verbalized understanding and agreement today's plan   New Prescriptions New  Prescriptions   No medications on file     Okey Regal, PA-C 11/22/15 [id number]    Merrily Pew, MD 11/23/15 [phone]

## 2015-12-04 ENCOUNTER — Ambulatory Visit: Payer: Self-pay | Admitting: Family Medicine

## 2015-12-15 ENCOUNTER — Ambulatory Visit: Payer: Self-pay

## 2015-12-18 MED FILL — LEVOTHYROXINE 75 MCG TABLET: 75 | 30 days supply | Qty: 90 | Fill #3

## 2015-12-18 MED FILL — ?CITALOPRAM HBR 40 MG TABLE: 40 | 30 days supply | Qty: 30 | Fill #3

## 2015-12-19 ENCOUNTER — Ambulatory Visit: Payer: Self-pay | Admitting: Family Medicine

## 2015-12-26 ENCOUNTER — Encounter: Payer: Self-pay | Admitting: Family Medicine

## 2015-12-26 ENCOUNTER — Ambulatory Visit: Payer: Self-pay | Attending: Family Medicine | Admitting: Family Medicine

## 2015-12-26 VITALS — BP 148/70 | HR 64 | Temp 98.3°F | Ht 69.0 in | Wt 368.8 lb

## 2015-12-26 DIAGNOSIS — Z6841 Body Mass Index (BMI) 40.0 and over, adult: Secondary | ICD-10-CM | POA: Insufficient documentation

## 2015-12-26 DIAGNOSIS — E039 Hypothyroidism, unspecified: Secondary | ICD-10-CM

## 2015-12-26 DIAGNOSIS — Z79899 Other long term (current) drug therapy: Secondary | ICD-10-CM | POA: Insufficient documentation

## 2015-12-26 DIAGNOSIS — J0120 Acute ethmoidal sinusitis, unspecified: Secondary | ICD-10-CM

## 2015-12-26 DIAGNOSIS — J322 Chronic ethmoidal sinusitis: Secondary | ICD-10-CM | POA: Insufficient documentation

## 2015-12-26 DIAGNOSIS — Z7984 Long term (current) use of oral hypoglycemic drugs: Secondary | ICD-10-CM | POA: Insufficient documentation

## 2015-12-26 DIAGNOSIS — E119 Type 2 diabetes mellitus without complications: Secondary | ICD-10-CM

## 2015-12-26 DIAGNOSIS — F331 Major depressive disorder, recurrent, moderate: Secondary | ICD-10-CM

## 2015-12-26 DIAGNOSIS — J4540 Moderate persistent asthma, uncomplicated: Secondary | ICD-10-CM

## 2015-12-26 LAB — COMPLETE METABOLIC PANEL WITH GFR
ALBUMIN: 4.1 g/dL (ref 3.6–5.1)
ALK PHOS: 88 U/L (ref 33–115)
ALT: 15 U/L (ref 6–29)
AST: 24 U/L (ref 10–35)
BILIRUBIN TOTAL: 0.6 mg/dL (ref 0.2–1.2)
BUN: 9 mg/dL (ref 7–25)
CO2: 29 mmol/L (ref 20–31)
Calcium: 8.9 mg/dL (ref 8.6–10.2)
Chloride: 106 mmol/L (ref 98–110)
Creat: 0.69 mg/dL (ref 0.50–1.10)
GFR, Est African American: >89 mL/min (ref >=60)
GFR, Est Non African American: >89 mL/min (ref >=60)
GLUCOSE: 115 mg/dL — AB (ref 65–99)
Potassium: 4.2 mmol/L (ref 3.5–5.3)
SODIUM: 141 mmol/L (ref 135–146)
TOTAL PROTEIN: 6.6 g/dL (ref 6.1–8.1)

## 2015-12-26 LAB — LIPID PANEL
Cholesterol: 152 mg/dL (ref <200)
HDL: 74 mg/dL (ref >50)
LDL Cholesterol: 63 mg/dL (ref <100)
Total CHOL/HDL Ratio: 2.1 Ratio (ref <5.0)
Triglycerides: 73 mg/dL (ref <150)
VLDL: 15 mg/dL (ref <30)

## 2015-12-26 LAB — POCT GLYCOSYLATED HEMOGLOBIN (HGB A1C): Hemoglobin A1C: 6.2

## 2015-12-26 LAB — TSH: TSH: 6.66 m[IU]/L — AB

## 2015-12-26 LAB — GLUCOSE, POCT (MANUAL RESULT ENTRY): POC Glucose: 120 mg/dl — AB (ref 70–99)

## 2015-12-26 MED ORDER — AMOXICILLIN 500 MG PO CAPS: 500.0000 mg | ORAL_CAPSULE | Freq: Three times a day (TID) | ORAL | 0 refills | Status: DC

## 2015-12-26 MED ORDER — LEVOTHYROXINE SODIUM 75 MCG PO TABS: 225.0000 ug | ORAL_TABLET | Freq: Every day | ORAL | 3 refills | Status: DC

## 2015-12-26 MED ORDER — METFORMIN HCL 1000 MG PO TABS: 1000.0000 mg | ORAL_TABLET | Freq: Two times a day (BID) | ORAL | 1 refills | Status: DC

## 2015-12-26 MED ORDER — ALBUTEROL SULFATE (2.5 MG/3ML) 0.083% IN NEBU: 2.5000 mg | INHALATION_SOLUTION | Freq: Four times a day (QID) | RESPIRATORY_TRACT | 1 refills | Status: DC | PRN

## 2015-12-26 MED ORDER — CITALOPRAM HYDROBROMIDE 40 MG PO TABS: 40.0000 mg | ORAL_TABLET | Freq: Every day | ORAL | 1 refills | Status: DC

## 2015-12-26 MED ORDER — ALBUTEROL SULFATE HFA 108 (90 BASE) MCG/ACT IN AERS: 1.0000 | INHALATION_SPRAY | Freq: Four times a day (QID) | RESPIRATORY_TRACT | 1 refills | Status: DC | PRN

## 2015-12-26 MED ORDER — ATORVASTATIN CALCIUM 20 MG PO TABS: 20.0000 mg | ORAL_TABLET | Freq: Every day | ORAL | 1 refills | Status: DC

## 2015-12-26 MED ORDER — FLUTICASONE-SALMETEROL 250-50 MCG/DOSE IN AEPB: 1.0000 | INHALATION_SPRAY | Freq: Two times a day (BID) | RESPIRATORY_TRACT | 3 refills | Status: DC

## 2015-12-26 MED FILL — AMOXICILLIN 500 MG CAPSULE: 500 | 10 days supply | Qty: 30 | Fill #0

## 2015-12-26 MED FILL — VENTOLIN HFA 90 MCG INHALER: 108 (90 BAS | 25 days supply | Qty: 18 | Fill #0

## 2015-12-26 MED FILL — ?METFORMIN HCL 1,000 MG TAB: 1000 | 30 days supply | Qty: 60 | Fill #0

## 2015-12-26 MED FILL — ATORVASTATIN 20 MG TABLET: 20 | 30 days supply | Qty: 30 | Fill #0

## 2015-12-26 MED FILL — ALBUTEROL 0.083% INHAL SOLN: (2.5 MG/3ML | 15 days supply | Qty: 180 | Fill #0

## 2015-12-26 MED FILL — **ADVAIR 250/50 DISKUS: 250-50 MCG | 7 days supply | Qty: 14 | Fill #0

## 2015-12-26 NOTE — Progress Notes (Signed)
Subjective:  Patient ID: Janet [name], female    DOB: 1970-11-26  Age: 45 y.o. MRN: [id number]  CC: Diabetes; Cough (productive- green mucus); Nasal Congestion; and Hypothyroidism   HPI Janet [name] is a 45 year old female with a history of type 2 diabetes mellitus (A1c 6.2), hypothyroidism, asthma, morbid obesity who presents today for a follow-up visit. She has been out of her levothyroxine for the last 1 week due to being unable to get to the pharmacy to pick up her medications.  She has been compliant with her metformin and fasting blood sugars have been under 120. She denies hypoglycemia, visual symptoms, numbness.  She complains of one-week history of nasal congestion, sinus tenderness, postnasal drip but denies fever. She has also had some body aches. Used Alka-Seltzer and ibuprofen with no improvement in symptoms. Works at a daycare.   She is currently stressed because her husband is hospitalized at this time and she has had to work full time as well.  Past Medical History:  Diagnosis Date  . Asthma   . Depression   . Diabetes mellitus without complication ([place])   . Thyroid disease     Past Surgical History:  Procedure Laterality Date  . CARPAL TUNNEL RELEASE    . CESAREAN SECTION    . CHOLECYSTECTOMY    . DILATION AND CURETTAGE OF UTERUS    . SINUS EXPLORATION      Allergies  Allergen Reactions  . Ciprofloxacin Shortness Of Breath     Outpatient Medications Prior to Visit  Medication Sig Dispense Refill  . ibuprofen (ADVIL,MOTRIN) 800 MG tablet Take 1 tablet (800 mg total) by mouth 3 (three) times daily. 21 tablet 0  . albuterol (PROVENTIL HFA;VENTOLIN HFA) 108 (90 Base) MCG/ACT inhaler Inhale 1-2 puffs into the lungs every 6 (six) hours as needed for wheezing or shortness of breath. 1 Inhaler 3  . albuterol (PROVENTIL) (2.5 MG/3ML) 0.083% nebulizer solution Take 3 mLs (2.5 mg total) by nebulization every 6 (six) hours as needed for wheezing or  shortness of breath. 75 mL 2  . citalopram (CELEXA) 40 MG tablet Take 1 tablet (40 mg total) by mouth daily. 30 tablet 3  . Fluticasone-Salmeterol (ADVAIR DISKUS) 250-50 MCG/DOSE AEPB Inhale 1 puff into the lungs 2 (two) times daily. 60 each 3  . metFORMIN (GLUCOPHAGE) 1000 MG tablet Take 1 tablet (1,000 mg total) by mouth 2 (two) times daily with a meal. 60 tablet 3  . cephALEXin (KEFLEX) 500 MG capsule Take 1 capsule (500 mg total) by mouth 2 (two) times daily. 14 capsule 0  . levothyroxine (SYNTHROID, LEVOTHROID) 75 MCG tablet Take 3 tablets (225 mcg total) by mouth daily before breakfast. (Patient not taking: Reported on 12/26/2015) 90 tablet 3   No facility-administered medications prior to visit.     ROS Review of Systems  Constitutional: Negative for activity change, appetite change and fatigue.  HENT:       See hpi  Eyes: Negative for visual disturbance.  Respiratory: Negative for cough, chest tightness, shortness of breath and wheezing.   Cardiovascular: Negative for chest pain and palpitations.  Gastrointestinal: Negative for abdominal distention, abdominal pain and constipation.  Endocrine: Negative for polydipsia.  Genitourinary: Negative for dysuria and frequency.  Musculoskeletal: Negative for arthralgias and back pain.  Skin: Negative for rash.  Neurological: Negative for tremors, light-headedness and numbness.  Hematological: Does not bruise/bleed easily.  Psychiatric/Behavioral: Negative for agitation and behavioral problems.    Objective:  BP (!) 148/70 (BP Location: Right Arm,  Patient Position: Sitting, Cuff Size: Large)   Pulse 64   Temp 98.3 F (36.8 C) (Oral)   Ht 5\' 9"  (1.753 m)   Wt (!) 368 lb 12.8 oz (167.3 kg)   SpO2 96%   BMI 54.46 kg/m   BP/Weight 12/26/2015 11/22/2015 [id number]  Systolic BP 123456 [id number]  Diastolic BP 70 79 70  Wt. (Lbs) 368.8 350 342  BMI 54.46 51.69 50.48      Physical Exam  Constitutional: She is oriented to person,  place, and time.  Morbidly obese, acutely ill-looking  HENT:  Frontal and maxillary sinus tenderness Mild erythema of right EAC, left is normal  Cardiovascular: Normal rate, normal heart sounds and intact distal pulses.   No murmur heard. Pulmonary/Chest: Effort normal and breath sounds normal. She has no wheezes. She has no rales. She exhibits no tenderness.  Abdominal: Soft. Bowel sounds are normal. She exhibits no distension and no mass. There is no tenderness.  Musculoskeletal: Normal range of motion.  Neurological: She is alert and oriented to person, place, and time.    Lab Results  Component Value Date   HGBA1C 6.2 12/26/2015    CMP Latest Ref Rng & Units 03/31/2015 03/30/2015 03/19/2015  Glucose 65 - 99 mg/dL 323(H) 182(H) 265(H)  BUN 6 - 20 mg/dL 9 8 8   Creatinine 0.44 - 1.00 mg/dL 0.65 0.71 0.60  Sodium 135 - 145 mmol/L 142 140 139  Potassium 3.5 - 5.1 mmol/L 4.3 3.3(L) 3.3(L)  Chloride 101 - 111 mmol/L 107 104 105  CO2 22 - 32 mmol/L 23 25 26   Calcium 8.9 - 10.3 mg/dL 9.8 8.8(L) 8.8(L)      . Assessment & Plan:   1. Moderate episode of recurrent major depressive disorder (HCC) Stable - citalopram (CELEXA) 40 MG tablet; Take 1 tablet (40 mg total) by mouth daily.  Dispense: 90 tablet; Refill: 1  2. Type 2 diabetes mellitus without complication, without long-term current use of insulin (HCC) Controlled with A1c of 6.2 - Glucose (CBG) - HgB A1c - metFORMIN (GLUCOPHAGE) 1000 MG tablet; Take 1 tablet (1,000 mg total) by mouth 2 (two) times daily with a meal.  Dispense: 180 tablet; Refill: 1 - atorvastatin (LIPITOR) 20 MG tablet; Take 1 tablet (20 mg total) by mouth daily.  Dispense: 90 tablet; Refill: 1 - COMPLETE METABOLIC PANEL WITH GFR - Lipid panel  3. Acute non-recurrent ethmoidal sinusitis Advised to take Tylenol, rest, increase fluid intake - amoxicillin (AMOXIL) 500 MG capsule; Take 1 capsule (500 mg total) by mouth 3 (three) times daily.  Dispense: 30  capsule; Refill: 0  4. Hypothyroidism, unspecified type TSH today If abnormal I will make no regimen changes as she has been out of her levothyroxine for 1 week. - levothyroxine (SYNTHROID, LEVOTHROID) 75 MCG tablet; Take 3 tablets (225 mcg total) by mouth daily before breakfast.  Dispense: 90 tablet; Refill: 3  5. Moderate persistent asthma without complication No acute exacerbations - albuterol (PROVENTIL HFA;VENTOLIN HFA) 108 (90 Base) MCG/ACT inhaler; Inhale 1-2 puffs into the lungs every 6 (six) hours as needed for wheezing or shortness of breath.  Dispense: 3 Inhaler; Refill: 1 - albuterol (PROVENTIL) (2.5 MG/3ML) 0.083% nebulizer solution; Take 3 mLs (2.5 mg total) by nebulization every 6 (six) hours as needed for wheezing or shortness of breath.  Dispense: 150 mL; Refill: 1 - Fluticasone-Salmeterol (ADVAIR DISKUS) 250-50 MCG/DOSE AEPB; Inhale 1 puff into the lungs 2 (two) times daily.  Dispense: 60 each; Refill: 3  6. Morbid obesity  Reduce portion sizes, increase physical activity    Elevated blood pressure likely due to acute illness. We will make no changes to her regimen as blood pressure has been previously controlled at other office visits.  Meds ordered this encounter  Medications  . albuterol (PROVENTIL HFA;VENTOLIN HFA) 108 (90 Base) MCG/ACT inhaler    Sig: Inhale 1-2 puffs into the lungs every 6 (six) hours as needed for wheezing or shortness of breath.    Dispense:  3 Inhaler    Refill:  1  . albuterol (PROVENTIL) (2.5 MG/3ML) 0.083% nebulizer solution    Sig: Take 3 mLs (2.5 mg total) by nebulization every 6 (six) hours as needed for wheezing or shortness of breath.    Dispense:  150 mL    Refill:  1  . citalopram (CELEXA) 40 MG tablet    Sig: Take 1 tablet (40 mg total) by mouth daily.    Dispense:  90 tablet    Refill:  1  . Fluticasone-Salmeterol (ADVAIR DISKUS) 250-50 MCG/DOSE AEPB    Sig: Inhale 1 puff into the lungs 2 (two) times daily.    Dispense:  60  each    Refill:  3  . levothyroxine (SYNTHROID, LEVOTHROID) 75 MCG tablet    Sig: Take 3 tablets (225 mcg total) by mouth daily before breakfast.    Dispense:  90 tablet    Refill:  3    Discontinue previous dose  . metFORMIN (GLUCOPHAGE) 1000 MG tablet    Sig: Take 1 tablet (1,000 mg total) by mouth 2 (two) times daily with a meal.    Dispense:  180 tablet    Refill:  1  . atorvastatin (LIPITOR) 20 MG tablet    Sig: Take 1 tablet (20 mg total) by mouth daily.    Dispense:  90 tablet    Refill:  1  . amoxicillin (AMOXIL) 500 MG capsule    Sig: Take 1 capsule (500 mg total) by mouth 3 (three) times daily.    Dispense:  30 capsule    Refill:  0    Follow-up: Return in about 3 months (around 03/27/2016), or if symptoms worsen or fail to improve, for Follow-up on diabetes mellitus.   Arnoldo Morale MD

## 2015-12-26 NOTE — Patient Instructions (Signed)

## 2015-12-26 NOTE — Progress Notes (Signed)
Medication refill

## 2016-01-01 ENCOUNTER — Telehealth: Payer: Self-pay

## 2016-01-01 NOTE — Telephone Encounter (Signed)
Writer called patient and LVM to discuss lab results.  Writer requested patient to return call and discuss.

## 2016-01-01 NOTE — Telephone Encounter (Signed)
-----   Message from Arnoldo Morale, MD sent at 12/27/2015  2:05 PM EST ----- Cholesterol is normal. Thyroid labs are  Abnormal, likely due to her running out of her medication. I will make no changes at this time.

## 2016-01-02 ENCOUNTER — Telehealth: Payer: Self-pay | Admitting: Family Medicine

## 2016-01-02 NOTE — Telephone Encounter (Signed)
Patient called the office regarding the call that she missed yesterday for her lab results. I informed patient what was written on her patient file (Cholesterol is normal. Thyroid labs are  Abnormal, likely due to her running out of her medication. I will make no changes at this time). Pt acknowledged that she understood and had no further questions or concerns.   Thank you.

## 2016-01-03 ENCOUNTER — Telehealth: Payer: Self-pay | Admitting: Family Medicine

## 2016-01-03 ENCOUNTER — Telehealth: Payer: Self-pay

## 2016-01-03 NOTE — Telephone Encounter (Signed)
-----   Message from Arnoldo Morale, MD sent at 12/27/2015  2:05 PM EST ----- Cholesterol is normal. Thyroid labs are  Abnormal, likely due to her running out of her medication. I will make no changes at this time.

## 2016-01-03 NOTE — Telephone Encounter (Signed)
Called and left pt a message informing her that she has prescription ready for pick up and to do so by 3pm on Monday 11/27.    Thank you.

## 2016-01-03 NOTE — Telephone Encounter (Signed)
Patient called and spoke with one of the front desk schedulers who read her the result note per Dr. Jarold Song.  Patient received the results.

## 2016-01-25 ENCOUNTER — Encounter: Payer: Self-pay | Admitting: Physician Assistant

## 2016-01-25 ENCOUNTER — Ambulatory Visit: Payer: Self-pay | Attending: Family Medicine | Admitting: Physician Assistant

## 2016-01-25 VITALS — BP 149/80 | HR 65 | Temp 97.8°F | Resp 16 | Wt 368.8 lb

## 2016-01-25 DIAGNOSIS — Z79899 Other long term (current) drug therapy: Secondary | ICD-10-CM | POA: Insufficient documentation

## 2016-01-25 DIAGNOSIS — Z0001 Encounter for general adult medical examination with abnormal findings: Secondary | ICD-10-CM | POA: Insufficient documentation

## 2016-01-25 DIAGNOSIS — Z7984 Long term (current) use of oral hypoglycemic drugs: Secondary | ICD-10-CM | POA: Insufficient documentation

## 2016-01-25 DIAGNOSIS — R062 Wheezing: Secondary | ICD-10-CM | POA: Insufficient documentation

## 2016-01-25 DIAGNOSIS — E1100 Type 2 diabetes mellitus with hyperosmolarity without nonketotic hyperglycemic-hyperosmolar coma (NKHHC): Secondary | ICD-10-CM | POA: Insufficient documentation

## 2016-01-25 DIAGNOSIS — J322 Chronic ethmoidal sinusitis: Secondary | ICD-10-CM | POA: Insufficient documentation

## 2016-01-25 LAB — GLUCOSE, POCT (MANUAL RESULT ENTRY): POC Glucose: 191 mg/dl — AB (ref 70–99)

## 2016-01-25 MED ORDER — FLUTICASONE PROPIONATE 50 MCG/ACT NA SUSP: 2.0000 | Freq: Every day | NASAL | 6 refills | Status: DC

## 2016-01-25 MED ORDER — METHYLPREDNISOLONE SODIUM SUCC 125 MG IJ SOLR
125.0000 mg | Freq: Once | INTRAMUSCULAR | Status: AC
  Administered 2016-01-25: 125 mg via INTRAMUSCULAR

## 2016-01-25 MED FILL — LEVOTHYROXINE 75 MCG TABLET: 75 | 30 days supply | Qty: 90 | Fill #0

## 2016-01-25 MED FILL — FLUTICASONE PROP 50 MCG SPR: 50 | 20 days supply | Qty: 16 | Fill #0

## 2016-01-25 MED FILL — **ADVAIR 250/50 DISKUS: 250-50 MCG | 7 days supply | Qty: 14 | Fill #1

## 2016-01-25 NOTE — Patient Instructions (Signed)
Check Blood sugars over the next few days since on steroids to watch for hyperglycemia

## 2016-01-25 NOTE — Progress Notes (Signed)
Janet [name], is a 45 y.o. female  [id number]  [id number]  DOB - [birth year]  Subjective:  Chief Complaint and HPI: Janet [name] is a 45 y.o. female here today for concern of continued sinusitis s/p completing a course of amoxicillin about 10 days ago.  She ffels her symptoms improved some while she was on the antibiotics but have returned.  Most of what she blows out of her nose is clear.  Her husband is in the hospital.  Her sugars run ~120 or under.  She c/o runny nose, sneezing, sinus pressure, some wheezing , and cough related to wheezing.  No f/c.  No sinus pain.     ROS:   Constitutional:  No f/c, No night sweats, No unexplained weight loss. EENT:  No vision changes, No blurry vision, No hearing changes. +ear and sinus congestion  Respiratory: Mild cough, + mild SOB and wheezing Cardiac: No CP, no palpitations GI:  No abd pain, No N/V/D. GU: No Urinary s/sx Musculoskeletal: No joint pain Neuro: No headache, no dizziness, no motor weakness.  Skin: No rash Endocrine:  No polydipsia. No polyuria.  Psych: Denies SI/HI  No problems updated.  ALLERGIES: Allergies  Allergen Reactions  . Ciprofloxacin Shortness Of Breath    PAST MEDICAL HISTORY: Past Medical History:  Diagnosis Date  . Asthma   . Depression   . Diabetes mellitus without complication ([place])   . Thyroid disease     MEDICATIONS AT HOME: Prior to Admission medications   Medication Sig Start Date End Date Taking? Authorizing Provider  albuterol (PROVENTIL HFA;VENTOLIN HFA) 108 (90 Base) MCG/ACT inhaler Inhale 1-2 puffs into the lungs every 6 (six) hours as needed for wheezing or shortness of breath. 12/26/15   Arnoldo Morale, MD  albuterol (PROVENTIL) (2.5 MG/3ML) 0.083% nebulizer solution Take 3 mLs (2.5 mg total) by nebulization every 6 (six) hours as needed for wheezing or shortness of breath. 12/26/15   Arnoldo Morale, MD  atorvastatin (LIPITOR) 20 MG tablet Take 1 tablet (20 mg total) by  mouth daily. 12/26/15   Arnoldo Morale, MD  citalopram (CELEXA) 40 MG tablet Take 1 tablet (40 mg total) by mouth daily. 12/26/15   Arnoldo Morale, MD  fluticasone (FLONASE) 50 MCG/ACT nasal spray Place 2 sprays into both nostrils daily. 01/25/16   Argentina Donovan, PA-C  Fluticasone-Salmeterol (ADVAIR DISKUS) 250-50 MCG/DOSE AEPB Inhale 1 puff into the lungs 2 (two) times daily. 12/26/15   Arnoldo Morale, MD  ibuprofen (ADVIL,MOTRIN) 800 MG tablet Take 1 tablet (800 mg total) by mouth 3 (three) times daily. 08/27/15   Ezequiel Essex, MD  levothyroxine (SYNTHROID, LEVOTHROID) 75 MCG tablet Take 3 tablets (225 mcg total) by mouth daily before breakfast. 12/26/15   Arnoldo Morale, MD  metFORMIN (GLUCOPHAGE) 1000 MG tablet Take 1 tablet (1,000 mg total) by mouth 2 (two) times daily with a meal. 12/26/15   Arnoldo Morale, MD     Objective:  EXAM:   Vitals:   01/25/16 1011  BP: (!) 149/80  Pulse: 65  Resp: 16  Temp: 97.8 F (36.6 C)  TempSrc: Oral  SpO2: 97%  Weight: (!) 368 lb 12.8 oz (167.3 kg)    General appearance : A&OX3. NAD. Non-toxic-appearing HEENT: Atraumatic and Normocephalic.  PERRLA. EOM intact.  TM clear full B without erythema.  +swelling of nasal turbinates.  She has a lot of clear rhinorrhea while I am in the room.  No TTP to sinus percussion Mouth-MMM, post pharynx WNL w/o erythema, No PND. Neck: supple,  no JVD. No cervical lymphadenopathy. No thyromegaly Chest/Lungs:  Breathing-non-labored, Good air entry bilaterally, +mild to moderate wheezing throughout CVS: S1 S2 regular, no murmurs, gallops, rubs  Extremities: Bilateral Lower Ext shows no edema, both legs are warm to touch with = pulse throughout Neurology:  CN II-XII grossly intact, Non focal.   Psych:  TP linear. J/I WNL. Normal speech. Appropriate eye contact and affect.  Skin:  No Rash  Data Review Lab Results  Component Value Date   HGBA1C 6.2 12/26/2015   HGBA1C 5.7 09/01/2015   HGBA1C 8.7 (H) 03/31/2015      Assessment & Plan   1. Uncontrolled type 2 diabetes mellitus with hyperosmolarity without coma, without long-term current use of insulin (HCC) Last A1C 6.2 ~ 84month ago.  Adequate control - POCT glucose (manual entry) Check sugar more frequently over the next week to 10 days to monitor for hyperglycemia induced by steroid use.  I have instructed her to drink a l9ot of water.  Got to the hospital if sugar>400 or if she is symptomatic.    2. Ethmoid sinusitis, unspecified chronicity Recent course of amoxicillin.  Still has sinus inflammation without pain.  Possible that this is a new URI and viral since possible exposure going in and out of the hospital to visit her husband vs post infection inflammation vs true sinusitis. - methylPREDNISolone sodium succinate (SOLU-MEDROL) 125 mg/2 mL injection 125 mg; Inject 2 mLs (125 mg total) into the muscle once. - fluticasone (FLONASE) 50 MCG/ACT nasal spray; Place 2 sprays into both nostrils daily.  Dispense: 16 g; Refill: 6 - DG Sinuses Complete; Future  3. Wheezing - methylPREDNISolone sodium succinate (SOLU-MEDROL) 125 mg/2 mL injection 125 mg; Inject 2 mLs (125 mg total) into the muscle once.  Check BP OOO when well.  Patient have been counseled extensively about nutrition and exercise  Return in about 6 weeks (around 03/07/2016), or recheck in 48 hrs if not improving from this illness, for f/up Dr Jarold Song; DM, htn, hypothyroid or sooner if needed.  The patient was given clear instructions to go to ER or return to medical center if symptoms don't improve, worsen or new problems develop. The patient verbalized understanding. The patient was told to call to get lab results if they haven't heard anything in the next week.     Freeman Caldron, PA-C [facility] and [facility] [place], [place]   01/25/2016, 10:27 AMPatient ID: Janet [name], female   DOB: 08-02-1970, 45 y.o.   MRN: [id number]

## 2016-01-26 ENCOUNTER — Ambulatory Visit (HOSPITAL_COMMUNITY)
Admission: RE | Admit: 2016-01-26 | Discharge: 2016-01-26 | Disposition: A | Discharge status: Home / Self Care | Payer: Self-pay | Source: Ambulatory Visit | Attending: Physician Assistant | Admitting: Physician Assistant

## 2016-01-26 DIAGNOSIS — J322 Chronic ethmoidal sinusitis: Secondary | ICD-10-CM | POA: Insufficient documentation

## 2016-01-28 ENCOUNTER — Other Ambulatory Visit: Payer: Self-pay | Admitting: Physician Assistant

## 2016-01-28 MED ORDER — FLUCONAZOLE 150 MG PO TABS: 150.0000 mg | ORAL_TABLET | Freq: Once | ORAL | 0 refills | Status: AC

## 2016-01-28 MED ORDER — AMOXICILLIN-POT CLAVULANATE 875-125 MG PO TABS: 1.0000 | ORAL_TABLET | Freq: Two times a day (BID) | ORAL | 0 refills | Status: DC

## 2016-01-29 ENCOUNTER — Telehealth: Payer: Self-pay | Admitting: Family Medicine

## 2016-01-29 MED FILL — AMOX-CLAV 875-125 MG TABLET: 875-125 | 14 days supply | Qty: 28 | Fill #0

## 2016-01-29 MED FILL — FLUCONAZOLE 150 MG TABLET: 150 | 3 days supply | Qty: 2 | Fill #0

## 2016-01-29 NOTE — Telephone Encounter (Signed)
Writer tried to call and give patient results to sinus films.  LVM requesting patient to call back to discuss.

## 2016-01-29 NOTE — Telephone Encounter (Signed)
Patient called to follow on the results of her x rays order by Dr. Thereasa Solo. Please follow up.  Thank you.

## 2016-01-30 ENCOUNTER — Telehealth: Payer: Self-pay | Admitting: Family Medicine

## 2016-01-30 NOTE — Telephone Encounter (Signed)
Patient was returning nurse's call regarding her results. Pt asked to be called at work number because she doesn't get home until 6pm. Please follow up.  Thank you.

## 2016-01-30 NOTE — Telephone Encounter (Signed)
Patient returned the nurse phone call. Please follow up.

## 2016-01-30 NOTE — Telephone Encounter (Signed)
After many calls back and forth writer was able to touch base with patient and discuss sinus xray results per [place], [place].  Patient stated understanding and will be picking up antibiotic today.

## 2016-02-01 ENCOUNTER — Telehealth: Payer: Self-pay

## 2016-02-01 NOTE — Telephone Encounter (Signed)
Contacted pt to go over lab results pt didn't answer lvm asking pt to give me a call back at her earliest convenience  

## 2016-02-07 ENCOUNTER — Telehealth: Payer: Self-pay | Admitting: Family Medicine

## 2016-02-07 NOTE — Telephone Encounter (Signed)
Pt is aware of results and doesn't have any questions or concern

## 2016-02-07 NOTE — Telephone Encounter (Signed)
Patient returned call for lab results. Patient prefers to be contacted in work phone. Please follow up.

## 2016-02-16 ENCOUNTER — Encounter: Payer: Self-pay | Admitting: Family Medicine

## 2016-02-16 ENCOUNTER — Ambulatory Visit: Payer: Self-pay | Attending: Family Medicine | Admitting: Family Medicine

## 2016-02-16 VITALS — BP 120/75 | HR 68 | Temp 97.5°F | Ht 69.0 in | Wt 367.6 lb

## 2016-02-16 DIAGNOSIS — J322 Chronic ethmoidal sinusitis: Secondary | ICD-10-CM

## 2016-02-16 DIAGNOSIS — F331 Major depressive disorder, recurrent, moderate: Secondary | ICD-10-CM

## 2016-02-16 DIAGNOSIS — Z79899 Other long term (current) drug therapy: Secondary | ICD-10-CM | POA: Insufficient documentation

## 2016-02-16 DIAGNOSIS — J4540 Moderate persistent asthma, uncomplicated: Secondary | ICD-10-CM

## 2016-02-16 DIAGNOSIS — E119 Type 2 diabetes mellitus without complications: Secondary | ICD-10-CM

## 2016-02-16 DIAGNOSIS — Z7984 Long term (current) use of oral hypoglycemic drugs: Secondary | ICD-10-CM | POA: Insufficient documentation

## 2016-02-16 DIAGNOSIS — Z6841 Body Mass Index (BMI) 40.0 and over, adult: Secondary | ICD-10-CM | POA: Insufficient documentation

## 2016-02-16 DIAGNOSIS — Z5189 Encounter for other specified aftercare: Secondary | ICD-10-CM | POA: Insufficient documentation

## 2016-02-16 DIAGNOSIS — E039 Hypothyroidism, unspecified: Secondary | ICD-10-CM

## 2016-02-16 LAB — GLUCOSE, POCT (MANUAL RESULT ENTRY): POC Glucose: 95 mg/dl (ref 70–99)

## 2016-02-16 LAB — TSH: TSH: 0.16 m[IU]/L — AB

## 2016-02-16 MED ORDER — DOXYCYCLINE HYCLATE 100 MG PO TABS: 100.0000 mg | ORAL_TABLET | Freq: Two times a day (BID) | ORAL | 0 refills | Status: DC

## 2016-02-16 MED ORDER — CITALOPRAM HYDROBROMIDE 40 MG PO TABS: 40.0000 mg | ORAL_TABLET | Freq: Every day | ORAL | 1 refills | Status: DC

## 2016-02-16 MED ORDER — PREDNISONE 20 MG PO TABS: 20.0000 mg | ORAL_TABLET | Freq: Two times a day (BID) | ORAL | 0 refills | Status: DC

## 2016-02-16 MED ORDER — CETIRIZINE HCL 10 MG PO TABS: 10.0000 mg | ORAL_TABLET | Freq: Every day | ORAL | 1 refills | Status: DC

## 2016-02-16 MED ORDER — FLUTICASONE-SALMETEROL 250-50 MCG/DOSE IN AEPB: 1.0000 | INHALATION_SPRAY | Freq: Two times a day (BID) | RESPIRATORY_TRACT | 3 refills | Status: DC

## 2016-02-16 MED FILL — !ADVAIR 250/50 DISKUS: 250-50 | 7 days supply | Qty: 14 | Fill #0

## 2016-02-16 MED FILL — CITALOPRAM HBR 40 MG TABLET: 40 | 30 days supply | Qty: 30 | Fill #0

## 2016-02-16 NOTE — Patient Instructions (Signed)

## 2016-02-16 NOTE — Progress Notes (Signed)
Need refill - advair, citalopram

## 2016-02-16 NOTE — Progress Notes (Signed)
Subjective:  Patient ID: Janet [name], female    DOB: 01/27/1971  Age: 46 y.o. MRN: [id number]  CC: Diabetes; Nasal Congestion; Asthma ("worse" nebulizing twice daily); Shortness of Breath; and Cough (productive- green)   HPI Janet [name] is a 46 year old female with a history of type 2 diabetes mellitus (A1c 6.2), hypothyroidism, asthma, morbid obesity who presents today for a follow-up visit  She has been compliant with all her medications and denies hypoglycemia, visual complaints or numbness in extremities.  Regards to her asthma she has experienced some wheezing and shortness of breath which is related to sinusitis which she has had for the last 6 weeks for which she initially received amoxicillin and then a month later received Augmentin, Flonase and 125 mg of intramuscular Solu-Medrol but complains her sinuses are still congested and she has lost her sense of smell or taste. Denies fever X-ray of the sinuses revealed probable mucoperiosteal thickening in the left maxillary sinus consistent with infection Of note she had sinus surgery 15 years ago.  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  Past Medical History:  Diagnosis Date  . Asthma   . Depression   . Diabetes mellitus without complication ([place])   . Thyroid disease     Past Surgical History:  Procedure Laterality Date  . CARPAL TUNNEL RELEASE    . CESAREAN SECTION    . CHOLECYSTECTOMY    . DILATION AND CURETTAGE OF UTERUS    . SINUS EXPLORATION      Allergies  Allergen Reactions  . Ciprofloxacin Shortness Of Breath     Outpatient Medications Prior to Visit  Medication Sig Dispense Refill  . albuterol (PROVENTIL HFA;VENTOLIN HFA) 108 (90 Base) MCG/ACT inhaler Inhale 1-2 puffs into the lungs every 6 (six) hours as needed for wheezing or shortness of breath. 3 Inhaler 1  . albuterol  (PROVENTIL) (2.5 MG/3ML) 0.083% nebulizer solution Take 3 mLs (2.5 mg total) by nebulization every 6 (six) hours as needed for wheezing or shortness of breath. 150 mL 1  . atorvastatin (LIPITOR) 20 MG tablet Take 1 tablet (20 mg total) by mouth daily. 90 tablet 1  . fluticasone (FLONASE) 50 MCG/ACT nasal spray Place 2 sprays into both nostrils daily. 16 g 6  . ibuprofen (ADVIL,MOTRIN) 800 MG tablet Take 1 tablet (800 mg total) by mouth 3 (three) times daily. 21 tablet 0  . levothyroxine (SYNTHROID, LEVOTHROID) 75 MCG tablet Take 3 tablets (225 mcg total) by mouth daily before breakfast. 90 tablet 3  . metFORMIN (GLUCOPHAGE) 1000 MG tablet Take 1 tablet (1,000 mg total) by mouth 2 (two) times daily with a meal. 180 tablet 1  . citalopram (CELEXA) 40 MG tablet Take 1 tablet (40 mg total) by mouth daily. 90 tablet 1  . Fluticasone-Salmeterol (ADVAIR DISKUS) 250-50 MCG/DOSE AEPB Inhale 1 puff into the lungs 2 (two) times daily. 60 each 3  . amoxicillin-clavulanate (AUGMENTIN) 875-125 MG tablet Take 1 tablet by mouth 2 (two) times daily. 28 tablet 0   No facility-administered medications prior to visit.     ROS Review of Systems  Constitutional: Negative for activity change, appetite change and fatigue.  HENT: Positive for congestion, postnasal drip and sinus pressure. Negative for sore throat.   Eyes: Negative for visual disturbance.  Respiratory: Positive for shortness of breath and wheezing. Negative for cough and chest tightness.   Cardiovascular:  Negative for chest pain and palpitations.  Gastrointestinal: Negative for abdominal distention, abdominal pain and constipation.  Endocrine: Negative for polydipsia.  Genitourinary: Negative for dysuria and frequency.  Musculoskeletal: Negative for arthralgias and back pain.  Skin: Negative for rash.  Neurological: Negative for tremors, light-headedness and numbness.  Hematological: Does not bruise/bleed easily.  Psychiatric/Behavioral: Negative  for agitation and behavioral problems.    Objective:  BP 120/75 (BP Location: Right Arm, Patient Position: Sitting, Cuff Size: Large)   Pulse 68   Temp 97.5 F (36.4 C) (Oral)   Ht 5\' 9"  (1.753 m)   Wt (!) 367 lb 9.6 oz (166.7 kg)   SpO2 96%   BMI 54.29 kg/m   BP/Weight 02/16/2016 01/25/2016 [id number]  Systolic BP 123456 123456 123456  Diastolic BP 75 80 70  Wt. (Lbs) 367.6 368.8 368.8  BMI 54.29 54.46 54.46      Physical Exam  Constitutional: She is oriented to person, place, and time. She appears well-developed and well-nourished.  Morbidly obese  HENT:  Right Ear: External ear normal.  Left Ear: External ear normal.  Mild oral pharyngeal erythema  Cardiovascular: Normal rate, normal heart sounds and intact distal pulses.   No murmur heard. Pulmonary/Chest: Effort normal. She has wheezes. She has no rales. She exhibits no tenderness.  Abdominal: Soft. Bowel sounds are normal. She exhibits no distension and no mass. There is no tenderness.  Musculoskeletal: Normal range of motion.  Neurological: She is alert and oriented to person, place, and time.     Lab Results  Component Value Date   HGBA1C 6.2 12/26/2015    CMP Latest Ref Rng & Units 12/26/2015 03/31/2015 03/30/2015  Glucose 65 - 99 mg/dL 115(H) 323(H) 182(H)  BUN 7 - 25 mg/dL 9 9 8   Creatinine 0.50 - 1.10 mg/dL 0.69 0.65 0.71  Sodium 135 - 146 mmol/L 141 142 140  Potassium 3.5 - 5.3 mmol/L 4.2 4.3 3.3(L)  Chloride 98 - 110 mmol/L 106 107 104  CO2 20 - 31 mmol/L 29 23 25   Calcium 8.6 - 10.2 mg/dL 8.9 9.8 8.8(L)  Total Protein 6.1 - 8.1 g/dL 6.6 - -  Total Bilirubin 0.2 - 1.2 mg/dL 0.6 - -  Alkaline Phos 33 - 115 U/L 88 - -  AST 10 - 35 U/L 24 - -  ALT 6 - 29 U/L 15 - -    Lipid Panel     Component Value Date/Time   CHOL 152 12/26/2015 0929   TRIG 73 12/26/2015 0929   HDL 74 12/26/2015 0929   CHOLHDL 2.1 12/26/2015 0929   VLDL 15 12/26/2015 0929   LDLCALC 63 12/26/2015 0929    EXAM: PARANASAL SINUSES -  COMPLETE 3 + VIEW  COMPARISON:  None in PACs  FINDINGS: There is subtle increased density in the left maxillary sinus consistent with mucoperiosteal thickening. No definite air-fluid levels are observed. The frontal, ethmoid, right maxillary, and sphenoid sinuses are grossly clear.  IMPRESSION: Probable mucoperiosteal thickening in the left maxillary sinus consistent with infection   Electronically Signed   By: David  Martinique M.D.   On: 01/26/2016 13:23  Assessment & Plan:   1. Moderate persistent asthma without complication Acute exacerbation driven by underlying chronic sinusitis Placed on prednisone - Fluticasone-Salmeterol (ADVAIR DISKUS) 250-50 MCG/DOSE AEPB; Inhale 1 puff into the lungs 2 (two) times daily.  Dispense: 1 each; Refill: 3  2. Hypothyroidism, unspecified type Previously uncontrolled due to to being out of medications She has been compliant for the last 2  months - TSH  3. Chronic ethmoidal sinusitis She has completed her course of amoxicillin, Augmentin and prednisone If symptoms persist she will need a CT of the sinuses given a prior history of sinus surgery to exclude underlying pathology - doxycycline (VIBRA-TABS) 100 MG tablet; Take 1 tablet (100 mg total) by mouth 2 (two) times daily.  Dispense: 20 tablet; Refill: 0 - predniSONE (DELTASONE) 20 MG tablet; Take 1 tablet (20 mg total) by mouth 2 (two) times daily with a meal.  Dispense: 10 tablet; Refill: 0 - cetirizine (ZYRTEC) 10 MG tablet; Take 1 tablet (10 mg total) by mouth daily.  Dispense: 30 tablet; Refill: 1  4. Moderate episode of recurrent major depressive disorder (HCC) Controlled - citalopram (CELEXA) 40 MG tablet; Take 1 tablet (40 mg total) by mouth daily.  Dispense: 90 tablet; Refill: 1  5. Diabetes mellitus without complication ([place]) Controlled with A1c of 6.2 Patient advised to take extra 500 mg metformin tablet for the next 1 week due to the fact that she will be taking  prednisone. - Glucose (CBG)   Meds ordered this encounter  Medications  . Fluticasone-Salmeterol (ADVAIR DISKUS) 250-50 MCG/DOSE AEPB    Sig: Inhale 1 puff into the lungs 2 (two) times daily.    Dispense:  1 each    Refill:  3  . doxycycline (VIBRA-TABS) 100 MG tablet    Sig: Take 1 tablet (100 mg total) by mouth 2 (two) times daily.    Dispense:  20 tablet    Refill:  0  . predniSONE (DELTASONE) 20 MG tablet    Sig: Take 1 tablet (20 mg total) by mouth 2 (two) times daily with a meal.    Dispense:  10 tablet    Refill:  0  . cetirizine (ZYRTEC) 10 MG tablet    Sig: Take 1 tablet (10 mg total) by mouth daily.    Dispense:  30 tablet    Refill:  1  . citalopram (CELEXA) 40 MG tablet    Sig: Take 1 tablet (40 mg total) by mouth daily.    Dispense:  90 tablet    Refill:  1    Follow-up: Return in about 1 month (around 03/18/2016) for Follow-up on chronic sinusitis.   Arnoldo Morale MD

## 2016-02-19 ENCOUNTER — Other Ambulatory Visit: Payer: Self-pay | Admitting: Family Medicine

## 2016-02-19 DIAGNOSIS — E039 Hypothyroidism, unspecified: Secondary | ICD-10-CM

## 2016-02-19 MED ORDER — LEVOTHYROXINE SODIUM 100 MCG PO TABS: 200.0000 ug | ORAL_TABLET | Freq: Every day | ORAL | 3 refills | Status: DC

## 2016-02-20 ENCOUNTER — Telehealth: Payer: Self-pay

## 2016-02-20 NOTE — Telephone Encounter (Signed)
-----   Message from Arnoldo Morale, MD sent at 02/19/2016 12:23 PM EST ----- Her labs reveal a swing towards hyperthyroidism and so I have sent a rx for a decreased dose of 200mg  daily to the pharmacy.

## 2016-02-20 NOTE — Telephone Encounter (Signed)
Writer called patient per Dr. Jarold Song and LVM requesting patient to call back to discuss.

## 2016-02-22 ENCOUNTER — Telehealth: Payer: Self-pay

## 2016-02-22 NOTE — Telephone Encounter (Signed)
-----   Message from Arnoldo Morale, MD sent at 02/19/2016 12:23 PM EST ----- Her labs reveal a swing towards hyperthyroidism and so I have sent a rx for a decreased dose of 200mg  daily to the pharmacy.

## 2016-02-22 NOTE — Telephone Encounter (Signed)
Writer spoke with patient today about lab results.  She states understanding about the change in her thyroid medication.

## 2016-02-29 ENCOUNTER — Ambulatory Visit: Payer: Self-pay | Admitting: Family Medicine

## 2016-03-11 ENCOUNTER — Encounter: Payer: Self-pay | Admitting: Family Medicine

## 2016-03-11 ENCOUNTER — Ambulatory Visit: Payer: Self-pay | Attending: Family Medicine | Admitting: Family Medicine

## 2016-03-11 VITALS — BP 127/75 | HR 74 | Temp 98.9°F | Ht 69.0 in | Wt 367.0 lb

## 2016-03-11 DIAGNOSIS — E119 Type 2 diabetes mellitus without complications: Secondary | ICD-10-CM

## 2016-03-11 DIAGNOSIS — Z5189 Encounter for other specified aftercare: Secondary | ICD-10-CM | POA: Insufficient documentation

## 2016-03-11 DIAGNOSIS — Z7984 Long term (current) use of oral hypoglycemic drugs: Secondary | ICD-10-CM | POA: Insufficient documentation

## 2016-03-11 DIAGNOSIS — Z9049 Acquired absence of other specified parts of digestive tract: Secondary | ICD-10-CM | POA: Insufficient documentation

## 2016-03-11 DIAGNOSIS — J4541 Moderate persistent asthma with (acute) exacerbation: Secondary | ICD-10-CM

## 2016-03-11 DIAGNOSIS — E039 Hypothyroidism, unspecified: Secondary | ICD-10-CM | POA: Insufficient documentation

## 2016-03-11 DIAGNOSIS — Z79899 Other long term (current) drug therapy: Secondary | ICD-10-CM | POA: Insufficient documentation

## 2016-03-11 DIAGNOSIS — Z9889 Other specified postprocedural states: Secondary | ICD-10-CM | POA: Insufficient documentation

## 2016-03-11 DIAGNOSIS — J320 Chronic maxillary sinusitis: Secondary | ICD-10-CM

## 2016-03-11 DIAGNOSIS — R0602 Shortness of breath: Secondary | ICD-10-CM | POA: Insufficient documentation

## 2016-03-11 LAB — GLUCOSE, POCT (MANUAL RESULT ENTRY): POC GLUCOSE: 134 mg/dL — AB (ref 70–99)

## 2016-03-11 MED ORDER — PREDNISONE 20 MG PO TABS: 20.0000 mg | ORAL_TABLET | Freq: Two times a day (BID) | ORAL | 0 refills | Status: DC

## 2016-03-11 NOTE — Progress Notes (Signed)
Subjective:  Patient ID: Janet [name], female    DOB: December 15, 1970  Age: 46 y.o. MRN: [id number]  CC: Diabetes; Nasal Congestion; Cough (productive- yellow); Shortness of Breath (on exertion); and Asthma   HPI Janet [name] presents is a 46 year old female with a history of type 2 diabetes mellitus (A1c 6.2), hypothyroidism, asthma, morbid obesity who presents today for a follow-up visit  She has been compliant with all her medications and denies hypoglycemia, visual complaints or numbness in extremities.  With regards to her asthma she has experienced some wheezing and shortness of breath which is related to sinusitis which she has had for the last 2 months for which she initially received amoxicillin and then a month later received Augmentin, Flonase and 125 mg of intramuscular Solu-Medrol but complains her sinuses are still congested; She received doxycycline at her visit 3 weeks ago and reports slight improvement in symptoms but continues to wheeze and he still congested; she has not had any fever. X-ray of the sinuses revealed probable mucoperiosteal thickening in the left maxillary sinus consistent with infection Of note she had sinus surgery 15 years ago.  Past Medical History:  Diagnosis Date  . Asthma   . Depression   . Diabetes mellitus without complication ([place])   . Thyroid disease     Past Surgical History:  Procedure Laterality Date  . CARPAL TUNNEL RELEASE    . CESAREAN SECTION    . CHOLECYSTECTOMY    . DILATION AND CURETTAGE OF UTERUS    . SINUS EXPLORATION      Allergies  Allergen Reactions  . Ciprofloxacin Shortness Of Breath     Outpatient Medications Prior to Visit  Medication Sig Dispense Refill  . albuterol (PROVENTIL HFA;VENTOLIN HFA) 108 (90 Base) MCG/ACT inhaler Inhale 1-2 puffs into the lungs every 6 (six) hours as needed for wheezing or shortness of breath. 3 Inhaler 1  . albuterol (PROVENTIL) (2.5 MG/3ML) 0.083% nebulizer solution Take 3  mLs (2.5 mg total) by nebulization every 6 (six) hours as needed for wheezing or shortness of breath. 150 mL 1  . atorvastatin (LIPITOR) 20 MG tablet Take 1 tablet (20 mg total) by mouth daily. 90 tablet 1  . cetirizine (ZYRTEC) 10 MG tablet Take 1 tablet (10 mg total) by mouth daily. 30 tablet 1  . citalopram (CELEXA) 40 MG tablet Take 1 tablet (40 mg total) by mouth daily. 90 tablet 1  . fluticasone (FLONASE) 50 MCG/ACT nasal spray Place 2 sprays into both nostrils daily. 16 g 6  . Fluticasone-Salmeterol (ADVAIR DISKUS) 250-50 MCG/DOSE AEPB Inhale 1 puff into the lungs 2 (two) times daily. 1 each 3  . ibuprofen (ADVIL,MOTRIN) 800 MG tablet Take 1 tablet (800 mg total) by mouth 3 (three) times daily. 21 tablet 0  . levothyroxine (SYNTHROID, LEVOTHROID) 100 MCG tablet Take 2 tablets (200 mcg total) by mouth daily before breakfast. 60 tablet 3  . metFORMIN (GLUCOPHAGE) 1000 MG tablet Take 1 tablet (1,000 mg total) by mouth 2 (two) times daily with a meal. 180 tablet 1  . doxycycline (VIBRA-TABS) 100 MG tablet Take 1 tablet (100 mg total) by mouth 2 (two) times daily. 20 tablet 0  . predniSONE (DELTASONE) 20 MG tablet Take 1 tablet (20 mg total) by mouth 2 (two) times daily with a meal. 10 tablet 0   No facility-administered medications prior to visit.     ROS Review of Systems Constitutional: Negative for activity change, appetite change and fatigue.  HENT: Positive for congestion, postnasal drip  and sinus pressure. Negative for sore throat.   Eyes: Negative for visual disturbance.  Respiratory: Positive for wheezing. Negative for cough and chest tightness.   Cardiovascular: Negative for chest pain and palpitations.  Gastrointestinal: Negative for abdominal distention, abdominal pain and constipation.  Endocrine: Negative for polydipsia.  Genitourinary: Negative for dysuria and frequency.  Musculoskeletal: Negative for arthralgias and back pain.  Skin: Negative for rash.  Neurological:  Negative for tremors, light-headedness and numbness.  Hematological: Does not bruise/bleed easily.  Psychiatric/Behavioral: Negative for agitation and behavioral problems.   Objective:  BP 127/75 (BP Location: Right Arm, Patient Position: Sitting, Cuff Size: Large)   Pulse 74   Temp 98.9 F (37.2 C) (Oral)   Ht 5\' 9"  (1.753 m)   Wt (!) 367 lb (166.5 kg)   SpO2 97%   BMI 54.20 kg/m   BP/Weight 03/11/2016 02/16/2016 [id number]  Systolic BP [id number] [id number] 123456  Diastolic BP 75 75 80  Wt. (Lbs) 367 367.6 368.8  BMI 54.2 54.29 54.46      Physical Exam Constitutional: She is oriented to person, place, and time. She appears well-developed and well-nourished.  Morbidly obese  HENT:  Right Ear: External ear normal.  Left Ear: External ear normal.  Mild oral pharyngeal erythema  Cardiovascular: Normal rate, normal heart sounds and intact distal pulses.   No murmur heard. Pulmonary/Chest: Effort normal. She has slight bilateral expiratory wheezing. She has no rales. She exhibits no tenderness.  Abdominal: Soft. Bowel sounds are normal. She exhibits no distension and no mass. There is no tenderness.  Musculoskeletal: Normal range of motion.  Neurological: She is alert and oriented to person, place, and time.   Lab Results  Component Value Date   HGBA1C 6.2 12/26/2015     Assessment & Plan:   1. Diabetes mellitus without complication ([place]) Controlled with A1c of 6.2 Continue medications - Glucose (CBG)  2. Moderate persistent asthma with acute exacerbation Sinusitis driving current exacerbation Continue inhalers - predniSONE (DELTASONE) 20 MG tablet; Take 1 tablet (20 mg total) by mouth 2 (two) times daily with a meal.  Dispense: 10 tablet; Refill: 0  3. Chronic maxillary sinusitis Continue Zyrtec and Flonase   Meds ordered this encounter  Medications  . predniSONE (DELTASONE) 20 MG tablet    Sig: Take 1 tablet (20 mg total) by mouth 2 (two) times daily with a meal.     Dispense:  10 tablet    Refill:  0    Follow-up: Return in about 3 months (around 06/09/2016), or if symptoms worsen or fail to improve, for follow up of chronic medical conditions.   Arnoldo Morale MD

## 2016-04-01 ENCOUNTER — Telehealth: Payer: Self-pay | Admitting: *Deleted

## 2016-04-01 MED ORDER — MONTELUKAST SODIUM 10 MG PO TABS: 10.0000 mg | ORAL_TABLET | Freq: Every day | ORAL | 3 refills | Status: DC

## 2016-04-01 MED FILL — ?LEVOTHYROXINE 100 MCG TAB: 100 | 30 days supply | Qty: 60 | Fill #0

## 2016-04-01 MED FILL — CITALOPRAM HBR 40 MG TABLET: 40 | 30 days supply | Qty: 30 | Fill #1

## 2016-04-01 MED FILL — !ADVAIR 250/50 DISKUS: 250-50 | 30 days supply | Qty: 60 | Fill #1

## 2016-04-01 NOTE — Telephone Encounter (Signed)
Patient presented to the office and complains of congestion being present for a week.

## 2016-04-01 NOTE — Telephone Encounter (Signed)
Prescription for Singulair sent to [facility] on file- [address]

## 2016-04-02 NOTE — Telephone Encounter (Signed)
Patient is aware. Thank you.

## 2016-04-11 MED FILL — MONTELUKAST SOD 10 MG TAB: 10 | 30 days supply | Qty: 30 | Fill #0

## 2016-04-22 ENCOUNTER — Encounter (HOSPITAL_BASED_OUTPATIENT_CLINIC_OR_DEPARTMENT_OTHER): Payer: Self-pay | Admitting: *Deleted

## 2016-04-22 ENCOUNTER — Emergency Department (HOSPITAL_BASED_OUTPATIENT_CLINIC_OR_DEPARTMENT_OTHER): Payer: Medicaid Other

## 2016-04-22 ENCOUNTER — Emergency Department (HOSPITAL_BASED_OUTPATIENT_CLINIC_OR_DEPARTMENT_OTHER)
Admission: EM | Admit: 2016-04-22 | Discharge: 2016-04-22 | Disposition: A | Discharge status: Home / Self Care | Payer: Medicaid Other | Attending: Emergency Medicine | Admitting: Emergency Medicine

## 2016-04-22 DIAGNOSIS — Z7984 Long term (current) use of oral hypoglycemic drugs: Secondary | ICD-10-CM | POA: Diagnosis not present

## 2016-04-22 DIAGNOSIS — E039 Hypothyroidism, unspecified: Secondary | ICD-10-CM | POA: Diagnosis not present

## 2016-04-22 DIAGNOSIS — N939 Abnormal uterine and vaginal bleeding, unspecified: Secondary | ICD-10-CM | POA: Diagnosis present

## 2016-04-22 DIAGNOSIS — J45909 Unspecified asthma, uncomplicated: Secondary | ICD-10-CM | POA: Insufficient documentation

## 2016-04-22 DIAGNOSIS — Z79899 Other long term (current) drug therapy: Secondary | ICD-10-CM | POA: Insufficient documentation

## 2016-04-22 DIAGNOSIS — E119 Type 2 diabetes mellitus without complications: Secondary | ICD-10-CM | POA: Insufficient documentation

## 2016-04-22 LAB — URINALYSIS, ROUTINE W REFLEX MICROSCOPIC
Bilirubin Urine: NEGATIVE
Glucose, UA: NEGATIVE mg/dL
Ketones, ur: NEGATIVE mg/dL
Nitrite: NEGATIVE
Protein, ur: NEGATIVE mg/dL
Specific Gravity, Urine: 1.011 (ref 1.005–1.030)
pH: 5.5 (ref 5.0–8.0)

## 2016-04-22 LAB — URINALYSIS, MICROSCOPIC (REFLEX)

## 2016-04-22 LAB — BASIC METABOLIC PANEL
ANION GAP: 7 (ref 5–15)
BUN: 7 mg/dL (ref 6–20)
CHLORIDE: 109 mmol/L (ref 101–111)
CO2: 25 mmol/L (ref 22–32)
Calcium: 8.5 mg/dL — ABNORMAL LOW (ref 8.9–10.3)
Creatinine, Ser: 0.71 mg/dL (ref 0.44–1.00)
GFR calc Af Amer: >60 mL/min (ref >60)
Glucose, Bld: 125 mg/dL — ABNORMAL HIGH (ref 65–99)
POTASSIUM: 3.9 mmol/L (ref 3.5–5.1)
SODIUM: 141 mmol/L (ref 135–145)

## 2016-04-22 LAB — WET PREP, GENITAL
Clue Cells Wet Prep HPF POC: NONE SEEN
Sperm: NONE SEEN
Trich, Wet Prep: NONE SEEN
Yeast Wet Prep HPF POC: NONE SEEN

## 2016-04-22 LAB — PREGNANCY, URINE: Preg Test, Ur: NEGATIVE

## 2016-04-22 LAB — CBC
HCT: 32.8 % — ABNORMAL LOW (ref 36.0–46.0)
Hemoglobin: 10.6 g/dL — ABNORMAL LOW (ref 12.0–15.0)
MCH: 27.6 pg (ref 26.0–34.0)
MCHC: 32.3 g/dL (ref 30.0–36.0)
MCV: 85.4 fL (ref 78.0–100.0)
Platelets: ADEQUATE 10*3/uL (ref 150–400)
RBC: 3.84 MIL/uL — ABNORMAL LOW (ref 3.87–5.11)
RDW: 14.5 % (ref 11.5–15.5)
WBC: 4.3 10*3/uL (ref 4.0–10.5)

## 2016-04-22 MED ORDER — MEGESTROL ACETATE 40 MG PO TABS: 40.0000 mg | ORAL_TABLET | Freq: Every day | ORAL | 0 refills | Status: DC

## 2016-04-22 MED FILL — MEGESTROL 40 MG TABLET: 40 | 5 days supply | Qty: 5 | Fill #0

## 2016-04-22 NOTE — ED Provider Notes (Signed)
[facility] Provider Note   CSN: [id number] Arrival date & time: 04/22/16  2952     History   Chief Complaint Chief Complaint  Patient presents with  . Vaginal Bleeding    HPI Janet [name] is a 46 y.o. female with history of hypothyroidism, asthma, depression, diabetes who presents with a 1 month history of vaginal bleeding. Patient states it began lightly but became much heavier over the past week. Patient states she is passing clots the size of a $0.50 pieces. 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 She is not filling this many night. Patient has had intermittent associated low back pain and right lower quadrant pain with her symptoms. Patient is not on any birth control or hormones. Patient reports this happening many years ago and having to have a cyst removed. Patient denies any fevers, chest pain, shortness of breath, other abdominal pain, nausea, vomiting, urinary symptoms. Patient has not taken any medications at home for her symptoms.  HPI  Past Medical History:  Diagnosis Date  . Asthma   . Depression   . Diabetes mellitus without complication ([place])   . Thyroid disease     Patient Active Problem List   Diagnosis Date Noted  . Diabetes mellitus without complication ([place]) 84/13/2440  . Morbid obesity ([place]) 04/12/2015  . Depression 04/12/2015  . Asthma 03/30/2015  . Hypoxemia 03/30/2015  . Hypothyroidism 03/30/2015  . Thrombocytopenia ([place]) 03/30/2015  . Hypokalemia 03/30/2015    Past Surgical History:  Procedure Laterality Date  . CARPAL TUNNEL RELEASE    . CESAREAN SECTION    . CHOLECYSTECTOMY    . DILATION AND CURETTAGE OF UTERUS    . SINUS EXPLORATION      OB History    No data available       Home Medications    Prior to Admission medications   Medication Sig Start Date End Date Taking? Authorizing Provider  albuterol (PROVENTIL HFA;VENTOLIN HFA) 108 (90 Base) MCG/ACT inhaler Inhale 1-2 puffs into  the lungs every 6 (six) hours as needed for wheezing or shortness of breath. 12/26/15  Yes Arnoldo Morale, MD  albuterol (PROVENTIL) (2.5 MG/3ML) 0.083% nebulizer solution Take 3 mLs (2.5 mg total) by nebulization every 6 (six) hours as needed for wheezing or shortness of breath. 12/26/15  Yes Arnoldo Morale, MD  citalopram (CELEXA) 40 MG tablet Take 1 tablet (40 mg total) by mouth daily. 02/16/16  Yes Arnoldo Morale, MD  fluticasone (FLONASE) 50 MCG/ACT nasal spray Place 2 sprays into both nostrils daily. 01/25/16  Yes Dionne Bucy McClung, PA-C  Fluticasone-Salmeterol (ADVAIR DISKUS) 250-50 MCG/DOSE AEPB Inhale 1 puff into the lungs 2 (two) times daily. 02/16/16  Yes Arnoldo Morale, MD  levothyroxine (SYNTHROID, LEVOTHROID) 100 MCG tablet Take 2 tablets (200 mcg total) by mouth daily before breakfast. 02/19/16  Yes Arnoldo Morale, MD  metFORMIN (GLUCOPHAGE) 1000 MG tablet Take 1 tablet (1,000 mg total) by mouth 2 (two) times daily with a meal. 12/26/15  Yes Enobong Amao, MD  montelukast (SINGULAIR) 10 MG tablet Take 1 tablet (10 mg total) by mouth at bedtime. 04/01/16  Yes Arnoldo Morale, MD  megestrol (MEGACE) 40 MG tablet Take 1 tablet (40 mg total) by mouth daily. 04/22/16   Frederica Kuster, PA-C    Family History No family history on file.  Social History Social History  Substance Use Topics  . Smoking status: Never Smoker  . Smokeless tobacco: Never Used  . Alcohol use  No     Allergies   Ciprofloxacin   Review of Systems Review of Systems  Constitutional: Negative for chills and fever.  HENT: Negative for facial swelling and sore throat.   Respiratory: Negative for shortness of breath.   Cardiovascular: Negative for chest pain.  Gastrointestinal: Positive for abdominal pain (intermittent). Negative for nausea and vomiting.  Genitourinary: Positive for menstrual problem and vaginal bleeding. Negative for dysuria.  Musculoskeletal: Positive for back pain (intermittent).  Skin: Negative for rash and  wound.  Neurological: Negative for headaches.  Psychiatric/Behavioral: The patient is not nervous/anxious.      Physical Exam Updated Vital Signs BP (!) 135/53 (BP Location: Right Arm)   Pulse 70   Temp 98.2 F (36.8 C) (Oral)   Resp 16   LMP 04/14/2016   SpO2 98%   Physical Exam  Constitutional: She appears well-developed and well-nourished. No distress.  HENT:  Head: Normocephalic and atraumatic.  Mouth/Throat: Oropharynx is clear and moist. No oropharyngeal exudate.  Eyes: Conjunctivae are normal. Pupils are equal, round, and reactive to light. Right eye exhibits no discharge. Left eye exhibits no discharge. No scleral icterus.  Neck: Normal range of motion. Neck supple. No thyromegaly present.  Cardiovascular: Normal rate, regular rhythm, normal heart sounds and intact distal pulses.  Exam reveals no gallop and no friction rub.   No murmur heard. Pulmonary/Chest: Effort normal and breath sounds normal. No stridor. No respiratory distress. She has no wheezes. She has no rales.  Abdominal: Soft. Bowel sounds are normal. She exhibits no distension. There is no tenderness. There is no rebound and no guarding.  Genitourinary: Uterus normal. Pelvic exam was performed with patient supine. Cervix exhibits no motion tenderness and no discharge. Right adnexum displays no mass, no tenderness and no fullness. Left adnexum displays no mass, no tenderness and no fullness. There is bleeding (mild-moderate amount) in the vagina. No vaginal discharge found.  Musculoskeletal: She exhibits no edema.  Lymphadenopathy:    She has no cervical adenopathy.  Neurological: She is alert. Coordination normal.  Skin: Skin is warm and dry. No rash noted. She is not diaphoretic. No pallor.  Psychiatric: She has a normal mood and affect.  Nursing note and vitals reviewed.    ED Treatments / Results  Labs (all labs ordered are listed, but only abnormal results are displayed) Labs Reviewed  WET PREP,  GENITAL - Abnormal; Notable for the following:       Result Value   WBC, Wet Prep HPF POC FEW (*)    All other components within normal limits  URINALYSIS, ROUTINE W REFLEX MICROSCOPIC - Abnormal; Notable for the following:    APPearance CLOUDY (*)    Hgb urine dipstick LARGE (*)    Leukocytes, UA TRACE (*)    All other components within normal limits  BASIC METABOLIC PANEL - Abnormal; Notable for the following:    Glucose, Bld 125 (*)    Calcium 8.5 (*)    All other components within normal limits  CBC - Abnormal; Notable for the following:    RBC 3.84 (*)    Hemoglobin 10.6 (*)    HCT 32.8 (*)    All other components within normal limits  URINALYSIS, MICROSCOPIC (REFLEX) - Abnormal; Notable for the following:    Bacteria, UA FEW (*)    Squamous Epithelial / LPF 0-5 (*)    All other components within normal limits  PREGNANCY, URINE  CBC WITH DIFFERENTIAL/PLATELET  GC/CHLAMYDIA PROBE AMP ([place]) NOT AT [facility]  EKG  EKG Interpretation None       Radiology US Transvaginal Non-ob  Result Date: 04/22/2016 CLINICAL DATA:  Dysfunctional uterine bleeding EXAM: TRANSABDOMINAL AND TRANSVAGINAL ULTRASOUND OF PELVIS TECHNIQUE: Both transabdominal and transvaginal ultrasound examinations of the pelvis were performed. Transabdominal technique was performed for global imaging of the pelvis including uterus, ovaries, adnexal regions, and pelvic cul-de-sac. It was necessary to proceed with endovaginal exam following the transabdominal exam to visualize the ovaries. COMPARISON:  None FINDINGS: Uterus Measurements: 12.8 x 6.6 x 7.3 cm. Heterogeneous echotexture throughout the myometrium without visible/ measurable focal fibroid. Visualization is somewhat limited by body habitus. Endometrium Thickness: Appears thickened, measuring up to 29 mm, difficult to visualize due to body habitus. Right ovary Measurements: 3.2 x 2.0 x 3.5 cm, difficult to visualize due to body habitus. No visible mass.  Left ovary Measurements: 4.6 x 4.2 x 3.3 cm, difficult to visualize. No visible mass. Other findings No abnormal free fluid. IMPRESSION: Limited study due to body habitus. Endometrium appears thickened at 29 mm. If bleeding remains unresponsive to hormonal or medical therapy, focal lesion work-up with sonohysterogram should be considered. Endometrial biopsy should also be considered in pre-menopausal patients at high risk for endometrial carcinoma. (Ref: Radiological Reasoning: Algorithmic Workup of Abnormal Vaginal Bleeding with Endovaginal Sonography and Sonohysterography. AJR 2008; 093:G18-29) Electronically Signed   By: Rolm Baptise M.D.   On: 04/22/2016 12:00   US Pelvis Complete  Result Date: 04/22/2016 CLINICAL DATA:  Dysfunctional uterine bleeding EXAM: TRANSABDOMINAL AND TRANSVAGINAL ULTRASOUND OF PELVIS TECHNIQUE: Both transabdominal and transvaginal ultrasound examinations of the pelvis were performed. Transabdominal technique was performed for global imaging of the pelvis including uterus, ovaries, adnexal regions, and pelvic cul-de-sac. It was necessary to proceed with endovaginal exam following the transabdominal exam to visualize the ovaries. COMPARISON:  None FINDINGS: Uterus Measurements: 12.8 x 6.6 x 7.3 cm. Heterogeneous echotexture throughout the myometrium without visible/ measurable focal fibroid. Visualization is somewhat limited by body habitus. Endometrium Thickness: Appears thickened, measuring up to 29 mm, difficult to visualize due to body habitus. Right ovary Measurements: 3.2 x 2.0 x 3.5 cm, difficult to visualize due to body habitus. No visible mass. Left ovary Measurements: 4.6 x 4.2 x 3.3 cm, difficult to visualize. No visible mass. Other findings No abnormal free fluid. IMPRESSION: Limited study due to body habitus. Endometrium appears thickened at 29 mm. If bleeding remains unresponsive to hormonal or medical therapy, focal lesion work-up with sonohysterogram should be  considered. Endometrial biopsy should also be considered in pre-menopausal patients at high risk for endometrial carcinoma. (Ref: Radiological Reasoning: Algorithmic Workup of Abnormal Vaginal Bleeding with Endovaginal Sonography and Sonohysterography. AJR 2008; 937:J69-67) Electronically Signed   By: Rolm Baptise M.D.   On: 04/22/2016 12:00    Procedures Procedures (including critical care time)  Medications Ordered in ED Medications - No data to display   Initial Impression / Assessment and Plan / ED Course  I have reviewed the triage vital signs and the nursing notes.  Pertinent labs & imaging results that were available during my care of the patient were reviewed by me and considered in my medical decision making (see chart for details).     CBC shows hemoglobin 10.6. BMP shows calcium 8.5, glucose 125. UA shows large hematuria, trace leukocytes, few bacteria. Wet prep shows few WBCs. Pelvic ultrasound shows thickened endometrium, 29 mm; if bleeding remains unresponsive to hormone therapy, focal lesion workup with spinal history should be considered; endometrial biopsy should also be considered and  premenopausal patient's at high risk for endometrial carcinoma. Will discharge patient home with Megace with follow-up to women's outpatient clinic for further evaluation. I encouraged the importance of this. Patient understands and agrees with plan. Strict return precautions given. Patient vitals stable throughout ED course and discharged in satisfactory condition. Discussed patient case of Dr. Alvino Chapel who agrees with plan  Final Clinical Impressions(s) / ED Diagnoses   Final diagnoses:  Vaginal bleeding    New Prescriptions New Prescriptions   MEGESTROL (MEGACE) 40 MG TABLET    Take 1 tablet (40 mg total) by mouth daily.     Frederica Kuster, PA-C 04/22/16 [place], MD 04/22/16 1431

## 2016-04-22 NOTE — ED Triage Notes (Signed)
C/o heavy menstral bleeding x 1 month, but has been really heavy in past week. States she is changing pads every 30 min and is passing big clots. C/o lower right abd pain and low back pain.

## 2016-04-22 NOTE — Discharge Instructions (Signed)
Medications: Megace  Treatment: Take Megace as prescribed for 5 days. Make sure to drink plenty of water. You can take ibuprofen as prescribed over-the-counter to help with your pelvic and back pain as well as your bleeding.  Follow-up: Please follow-up with the women's outpatient clinic as soon as possible for further evaluation and treatment of your vaginal bleeding and findings on ultrasound. Please go to the [facility] emergency department if you develop any new or worsening female symptoms. Please go to the emergency department if you develop any other new or worsening symptoms.

## 2016-04-23 LAB — GC/CHLAMYDIA PROBE AMP (~~LOC~~) NOT AT ARMC
Chlamydia: NEGATIVE
Neisseria Gonorrhea: NEGATIVE

## 2016-05-06 ENCOUNTER — Encounter: Payer: Self-pay | Admitting: Family Medicine

## 2016-05-07 ENCOUNTER — Inpatient Hospital Stay (HOSPITAL_COMMUNITY)
Admission: AD | Admit: 2016-05-07 | Discharge: 2016-05-07 | Disposition: A | Discharge status: Home / Self Care | Payer: Medicaid Other | Source: Ambulatory Visit | Attending: Family Medicine | Admitting: Family Medicine

## 2016-05-07 ENCOUNTER — Encounter (HOSPITAL_COMMUNITY): Payer: Self-pay | Admitting: *Deleted

## 2016-05-07 DIAGNOSIS — N939 Abnormal uterine and vaginal bleeding, unspecified: Secondary | ICD-10-CM | POA: Diagnosis present

## 2016-05-07 DIAGNOSIS — R102 Pelvic and perineal pain: Secondary | ICD-10-CM | POA: Insufficient documentation

## 2016-05-07 DIAGNOSIS — E119 Type 2 diabetes mellitus without complications: Secondary | ICD-10-CM | POA: Diagnosis not present

## 2016-05-07 DIAGNOSIS — Z7984 Long term (current) use of oral hypoglycemic drugs: Secondary | ICD-10-CM | POA: Insufficient documentation

## 2016-05-07 LAB — URINALYSIS, MICROSCOPIC (REFLEX)

## 2016-05-07 LAB — URINALYSIS, ROUTINE W REFLEX MICROSCOPIC
BILIRUBIN URINE: NEGATIVE
GLUCOSE, UA: 250 mg/dL — AB
KETONES UR: 15 mg/dL — AB
Nitrite: POSITIVE — AB
PH: 6.5 (ref 5.0–8.0)
Protein, ur: >300 mg/dL — AB
SPECIFIC GRAVITY, URINE: 1.01 (ref 1.005–1.030)

## 2016-05-07 LAB — COMPREHENSIVE METABOLIC PANEL
ALT: 21 U/L (ref 14–54)
ANION GAP: 7 (ref 5–15)
AST: 28 U/L (ref 15–41)
Albumin: 4.1 g/dL (ref 3.5–5.0)
Alkaline Phosphatase: 78 U/L (ref 38–126)
BUN: 9 mg/dL (ref 6–20)
CHLORIDE: 107 mmol/L (ref 101–111)
CO2: 24 mmol/L (ref 22–32)
CREATININE: 0.71 mg/dL (ref 0.44–1.00)
Calcium: 8.7 mg/dL — ABNORMAL LOW (ref 8.9–10.3)
Glucose, Bld: 103 mg/dL — ABNORMAL HIGH (ref 65–99)
Potassium: 3.4 mmol/L — ABNORMAL LOW (ref 3.5–5.1)
Sodium: 138 mmol/L (ref 135–145)
Total Bilirubin: 0.7 mg/dL (ref 0.3–1.2)
Total Protein: 7.6 g/dL (ref 6.5–8.1)

## 2016-05-07 LAB — CBC
HCT: 29.5 % — ABNORMAL LOW (ref 36.0–46.0)
Hemoglobin: 9.8 g/dL — ABNORMAL LOW (ref 12.0–15.0)
MCH: 27.1 pg (ref 26.0–34.0)
MCHC: 33.2 g/dL (ref 30.0–36.0)
MCV: 81.5 fL (ref 78.0–100.0)
PLATELETS: 153 10*3/uL (ref 150–400)
RBC: 3.62 MIL/uL — ABNORMAL LOW (ref 3.87–5.11)
RDW: 14.3 % (ref 11.5–15.5)
WBC: 5.8 10*3/uL (ref 4.0–10.5)

## 2016-05-07 LAB — POCT PREGNANCY, URINE: PREG TEST UR: NEGATIVE

## 2016-05-07 MED ORDER — MEGESTROL ACETATE 40 MG PO TABS: 80.0000 mg | ORAL_TABLET | Freq: Three times a day (TID) | ORAL | 1 refills | Status: DC

## 2016-05-07 NOTE — MAU Provider Note (Signed)
Patient Janet [name] is a 46 year old G2P1101 here with complaints of vaginal bleeding.  Patient was seen in the [facility] for abdnormal uterine bleeding on 3-12 and was discharged home with instructions to take Megace tablets (40 mg) daily and recommended follow up at [facility] clinic. She was only give 5 megace tablets and states that her bleeding got much worse once she ran out of the medication.   She was scheduled to have a follow up appointment on 3/26 (yesterday)  which she had to cancel after she already canceled her appointment on 4-20.  History     CSN: [id number]  Arrival date and time: 05/07/16 0737   First Provider Initiated Contact with Patient 05/07/16 2014      Chief Complaint  Patient presents with  . Vaginal Bleeding   Vaginal Bleeding  The patient's primary symptoms include pelvic pain. The patient's pertinent negatives include no genital itching, genital lesions, genital odor or missed menses. The problem occurs constantly. The symptoms are aggravated by activity. She uses nothing for contraception.  Pelvic Pain  The patient's primary symptoms include pelvic pain. The patient's pertinent negatives include no genital itching, genital lesions, genital odor or missed menses. The current episode started in the past 7 days. The problem occurs intermittently. The problem has been unchanged. The pain is moderate (7/10). The problem affects both sides. She is not pregnant. She has tried NSAIDs (thinks it may have helped a little bit) for the symptoms. Her menstrual history has been irregular.  The pain is a cramping feeling and it comes and goes througout the day.   OB History    Gravida Para Term Preterm AB Living   2 2 1 1  0 1   SAB TAB Ectopic Multiple Live Births   0 0 0 0 1      Past Medical History:  Diagnosis Date  . Asthma   . Depression   . Diabetes mellitus without complication ([place])   . Thyroid disease     Past Surgical History:   Procedure Laterality Date  . CARPAL TUNNEL RELEASE    . CESAREAN SECTION    . CHOLECYSTECTOMY    . DILATION AND CURETTAGE OF UTERUS    . SINUS EXPLORATION      No family history on file.  Social History  Substance Use Topics  . Smoking status: Never Smoker  . Smokeless tobacco: Never Used  . Alcohol use No    Allergies:  Allergies  Allergen Reactions  . Ciprofloxacin Shortness Of Breath    Prescriptions Prior to Admission  Medication Sig Dispense Refill Last Dose  . albuterol (PROVENTIL HFA;VENTOLIN HFA) 108 (90 Base) MCG/ACT inhaler Inhale 1-2 puffs into the lungs every 6 (six) hours as needed for wheezing or shortness of breath. 3 Inhaler 1 Taking  . albuterol (PROVENTIL) (2.5 MG/3ML) 0.083% nebulizer solution Take 3 mLs (2.5 mg total) by nebulization every 6 (six) hours as needed for wheezing or shortness of breath. 150 mL 1 Taking  . citalopram (CELEXA) 40 MG tablet Take 1 tablet (40 mg total) by mouth daily. 90 tablet 1 Taking  . fluticasone (FLONASE) 50 MCG/ACT nasal spray Place 2 sprays into both nostrils daily. 16 g 6 Taking  . Fluticasone-Salmeterol (ADVAIR DISKUS) 250-50 MCG/DOSE AEPB Inhale 1 puff into the lungs 2 (two) times daily. 1 each 3 Taking  . levothyroxine (SYNTHROID, LEVOTHROID) 100 MCG tablet Take 2 tablets (200 mcg total) by mouth daily before breakfast. 60 tablet 3 Taking  .  megestrol (MEGACE) 40 MG tablet Take 1 tablet (40 mg total) by mouth daily. 5 tablet 0   . metFORMIN (GLUCOPHAGE) 1000 MG tablet Take 1 tablet (1,000 mg total) by mouth 2 (two) times daily with a meal. 180 tablet 1 Taking  . montelukast (SINGULAIR) 10 MG tablet Take 1 tablet (10 mg total) by mouth at bedtime. 30 tablet 3     Review of Systems  Constitutional: Negative.   HENT: Negative.   Eyes: Negative.   Respiratory: Negative.   Endocrine: Negative.   Genitourinary: Positive for pelvic pain and vaginal bleeding. Negative for missed menses.   Physical Exam   Blood pressure  (!) 128/57, pulse 79, temperature 98.4 F (36.9 C), temperature source Oral, resp. rate 20, SpO2 99 %.  Physical Exam  Constitutional: She is oriented to person, place, and time. She appears well-developed and well-nourished.  HENT:  Head: Normocephalic.  Eyes: Pupils are equal, round, and reactive to light.  Neck: Normal range of motion.  Respiratory: Effort normal. No respiratory distress.  GI: Soft.  Genitourinary:  Genitourinary Comments: NEFG; patient had significant discomfort with speculum exam but upon gentle spec exam no clots were visualized in the vagina. Bimanual exam deferred per patient preference.   Musculoskeletal: Normal range of motion.  Neurological: She is alert and oriented to person, place, and time.  Skin: Skin is warm and dry.    MAU Course  Procedures  MDM -CBC: Hg is 9.8; patient is not symptomatic.  -CMP: normal   Assessment and Plan   1. Abnormal uterine bleeding    2. Patient stable for discharge 3. RX for Megace 80 mg TID given and instructed to begin OTC iron supplements.  4. Patient will call the clinic tomorrow to schedule an appointment with an MD to talk about possible hysterectomy and endometrial biopsy.   5. Encouraged patient to return to MAU if bleeding becomes significantly worse or she starts to have shortness of breath, fatigue or dizziness.  Mervyn Skeeters [place] CNM 05/07/2016, 8:17 PM

## 2016-05-07 NOTE — Discharge Instructions (Signed)
Iron tablets, capsules, extended-release tablets What is this medicine? IRON (AHY ern) replaces iron that is essential to healthy red blood cells. Iron is used to treat iron deficiency anemia. Anemia may cause problems like tiredness, shortness of breath, or slowed growth in children. Only take iron if your doctor has told you to. Do not treat yourself with iron if you are feeling tired. Most healthy people get enough iron in their diets, particularly if they eat cereals, meat, poultry, and fish. This medicine may be used for other purposes; ask your health care provider or pharmacist if you have questions. COMMON BRAND NAME(S): Bifera, Duofer, Feosol, Feosol Complete, [employer], [place], [place], [place], [place], Ferrimin, Ferro-Sequels, Ferrocite, Hemocyte, Nephro-Fer, NovaFerrum, ProFe, Proferrin ES, Slow Fe, Slow Iron, Tandem What should I tell my health care provider before I take this medicine? They need to know if you have any of these conditions: -frequently drink alcohol -bowel disease -hemolytic anemia -iron overload (hemochromatosis, hemosiderosis) -liver disease -problems with swallowing -stomach ulcer or other stomach problems -an unusual or allergic reaction to iron, other medicines, foods, dyes, or preservatives -pregnant or trying to get pregnant -breast-feeding How should I use this medicine? Take this medicine by mouth with a glass of water or fruit juice. Follow the directions on the prescription label. Swallow whole. Do not crush or chew. Take this medicine in an upright or sitting position. Try to take any bedtime doses at least 10 minutes before lying down. You may take this medicine with food. Take your medicine at regular intervals. Do not take your medicine more often than directed. Do not stop taking except on your doctor's advice. Talk to your pediatrician regarding the use of this medicine in children. While this drug may be prescribed for selected  conditions, precautions do apply. Overdosage: If you think you have taken too much of this medicine contact a poison control center or emergency room at once. NOTE: This medicine is only for you. Do not share this medicine with others. What if I miss a dose? If you miss a dose, take it as soon as you can. If it is almost time for your next dose, take only that dose. Do not take double or extra doses. What may interact with this medicine? If you are taking this iron product, you should not take iron in any other medicine or dietary supplement. This medicine may also interact with the following medications: -alendronate -antacids -cefdinir -chloramphenicol -cholestyramine -deferoxamine -dimercaprol -etidronate -medicines for stomach ulcers or other stomach problems -pancreatic enzymes -quinolone antibiotics (examples: Cipro, Floxin, Levaquin, Tequin and others) -risedronate -tetracycline antibiotics (examples: doxycycline, tetracycline, minocycline, and others) -thyroid hormones This list may not describe all possible interactions. Give your health care provider a list of all the medicines, herbs, non-prescription drugs, or dietary supplements you use. Also tell them if you smoke, drink alcohol, or use illegal drugs. Some items may interact with your medicine. What should I watch for while using this medicine? Use iron supplements only as directed by your health care professional. Dennis Bast will need important blood work while you are taking this medicine. It may take 3 to 6 months of therapy to treat low iron levels. Pregnant women should follow the dose and length of iron treatment as directed by their doctors. Do not use iron longer than prescribed, and do not take a higher dose than recommended. Long-term use may cause excess iron to build-up in the body. Do not take iron with antacids. If you need to take  an antacid, take it 2 hours after a dose of iron. What side effects may I notice from  receiving this medicine? Side effects that you should report to your doctor or health care professional as soon as possible: -allergic reactions like skin rash, itching or hives, swelling of the face, lips, or tongue -blue lips, nails, or palms -dark colored stools (this may be due to the iron, but can indicate a more serious condition) -drowsiness -pain with or difficulty swallowing -pale or clammy skin -seizures -stomach pain -unusually weak or tired -vomiting -weak, fast, or irregular heartbeat Side effects that usually do not require medical attention (report to your doctor or health care professional if they continue or are bothersome): -constipation -indigestion -nausea or stomach upset This list may not describe all possible side effects. Call your doctor for medical advice about side effects. You may report side effects to FDA at 1-800-FDA-1088. Where should I keep my medicine? Keep out of the reach of children. Even small amounts of iron can be harmful to a child. Store at room temperature between 15 and 30 degrees C (59 and 86 degrees F). Keep container tightly closed. Throw away any unused medicine after the expiration date. NOTE: This sheet is a summary. It may not cover all possible information. If you have questions about this medicine, talk to your doctor, pharmacist, or health care provider.  2018 Elsevier/Gold Standard (2007-06-16 17:03:41)    Dysfunctional Uterine Bleeding Dysfunctional uterine bleeding is abnormal bleeding from the uterus. Dysfunctional uterine bleeding includes:  A period that comes earlier or later than usual.  A period that is lighter, heavier, or has blood clots.  Bleeding between periods.  Skipping one or more periods.  Bleeding after sexual intercourse.  Bleeding after menopause. Follow these instructions at home: Pay attention to any changes in your symptoms. Follow these instructions to help with your condition: Eating and  drinking   Eat well-balanced meals. Include foods that are high in iron, such as liver, meat, shellfish, green leafy vegetables, and eggs.  If you become constipated:  Drink plenty of water.  Eat fruits and vegetables that are high in water and fiber, such as spinach, carrots, raspberries, apples, and mango. Medicines   Take over-the-counter and prescription medicines only as told by your health care provider.  Do not change medicines without talking with your health care provider.  Aspirin or medicines that contain aspirin may make the bleeding worse. Do not take those medicines:  During the week before your period.  During your period.  If you were prescribed iron pills, take them as told by your health care provider. Iron pills help to replace iron that your body loses because of this condition. Activity   If you need to change your sanitary pad or tampon more than one time every 2 hours:  Lie in bed with your feet raised (elevated).  Place a cold pack on your lower abdomen.  Rest as much as possible until the bleeding stops or slows down.  Do not try to lose weight until the bleeding has stopped and your blood iron level is back to normal. Other Instructions   For two months, write down:  When your period starts.  When your period ends.  When any abnormal bleeding occurs.  What problems you notice.  Keep all follow up visits as told by your health care provider. This is important. Contact a health care provider if:  You get light-headed or weak.  You have nausea and vomiting.  You cannot eat or drink without vomiting.  You feel dizzy or have diarrhea while you are taking medicines.  You are taking birth control pills or hormones, and you want to change them or stop taking them. Get help right away if:  You develop a fever or chills.  You need to change your sanitary pad or tampon more than one time per hour.  Your bleeding becomes heavier, or your  flow contains clots more often.  You develop pain in your abdomen.  You lose consciousness.  You develop a rash. This information is not intended to replace advice given to you by your health care provider. Make sure you discuss any questions you have with your health care provider. Document Released: 01/26/2000 Document Revised: 07/06/2015 Document Reviewed: 04/25/2014 Elsevier Interactive Patient Education  2017 [employer].

## 2016-05-07 NOTE — MAU Note (Signed)
Patient was seen at [facility] ED a few weeks ago for vaginal bleeding and started on "hormones" per patient.  Pt has continued to bleed, but started passing multiple clots today around noon.  Has changed 5 saturated pads today.  Also c/o lower abdominal cramping.

## 2016-05-08 ENCOUNTER — Ambulatory Visit (INDEPENDENT_AMBULATORY_CARE_PROVIDER_SITE_OTHER): Payer: Medicaid Other | Admitting: Obstetrics & Gynecology

## 2016-05-08 ENCOUNTER — Encounter: Payer: Self-pay | Admitting: Obstetrics & Gynecology

## 2016-05-08 DIAGNOSIS — D649 Anemia, unspecified: Secondary | ICD-10-CM | POA: Diagnosis not present

## 2016-05-08 DIAGNOSIS — N938 Other specified abnormal uterine and vaginal bleeding: Secondary | ICD-10-CM | POA: Diagnosis present

## 2016-05-08 DIAGNOSIS — Z3202 Encounter for pregnancy test, result negative: Secondary | ICD-10-CM

## 2016-05-08 NOTE — Progress Notes (Signed)
   Subjective:    Patient ID: Janet [name], female    DOB: [birth year], 46 y.o.   MRN: [id number]  HPI 46 yo MW P1 (46 yo boy) here today for [facility] due to DUB for about 10 years. Periods are sometimes skipped and then she may bleed for a month.   Review of Systems She has been having unprotected sex forever She works at [employer]. U/S showed a thickened endometrium at 2.9 cm No fibroids She is currently taking megace since MAU visit last night. HBG 9.8    Objective:   Physical Exam Pleasant morbidly obese WFNAD She was unable to tolerate even a speculum exam so I was unable to do the pap or EMBX       Assessment & Plan:  DUB- plan for d&c and Pap under anesthesia

## 2016-05-09 ENCOUNTER — Encounter (HOSPITAL_COMMUNITY): Payer: Self-pay

## 2016-05-09 LAB — POCT PREGNANCY, URINE: PREG TEST UR: NEGATIVE

## 2016-05-13 MED FILL — CITALOPRAM HBR 40 MG TABLET: 40 | 30 days supply | Qty: 30 | Fill #2

## 2016-05-13 MED FILL — MONTELUKAST SOD 10 MG TAB: 10 | 30 days supply | Qty: 30 | Fill #1

## 2016-05-13 MED FILL — ?LEVOTHYROXINE 100 MCG TAB: 100 | 30 days supply | Qty: 60 | Fill #1

## 2016-05-17 ENCOUNTER — Telehealth: Payer: Self-pay | Admitting: General Practice

## 2016-05-17 NOTE — Telephone Encounter (Signed)
Patient called and left message stating she still hasn't received her surgery date and she would like to know when that is. Called patient and she states she actually received her letter in the mail with her surgery date yesterday. Patient had no questions

## 2016-05-27 ENCOUNTER — Ambulatory Visit: Payer: Medicaid Other | Attending: Family Medicine | Admitting: Family Medicine

## 2016-05-27 ENCOUNTER — Encounter: Payer: Self-pay | Admitting: Family Medicine

## 2016-05-27 VITALS — BP 138/78 | HR 69 | Temp 98.4°F | Ht 69.0 in | Wt 364.0 lb

## 2016-05-27 DIAGNOSIS — Z7984 Long term (current) use of oral hypoglycemic drugs: Secondary | ICD-10-CM | POA: Diagnosis not present

## 2016-05-27 DIAGNOSIS — E1149 Type 2 diabetes mellitus with other diabetic neurological complication: Secondary | ICD-10-CM

## 2016-05-27 DIAGNOSIS — M79604 Pain in right leg: Secondary | ICD-10-CM | POA: Insufficient documentation

## 2016-05-27 DIAGNOSIS — E119 Type 2 diabetes mellitus without complications: Secondary | ICD-10-CM | POA: Insufficient documentation

## 2016-05-27 DIAGNOSIS — J322 Chronic ethmoidal sinusitis: Secondary | ICD-10-CM | POA: Insufficient documentation

## 2016-05-27 DIAGNOSIS — E039 Hypothyroidism, unspecified: Secondary | ICD-10-CM | POA: Diagnosis not present

## 2016-05-27 DIAGNOSIS — Z79899 Other long term (current) drug therapy: Secondary | ICD-10-CM | POA: Diagnosis not present

## 2016-05-27 DIAGNOSIS — J4540 Moderate persistent asthma, uncomplicated: Secondary | ICD-10-CM | POA: Insufficient documentation

## 2016-05-27 DIAGNOSIS — F331 Major depressive disorder, recurrent, moderate: Secondary | ICD-10-CM | POA: Insufficient documentation

## 2016-05-27 DIAGNOSIS — Z9049 Acquired absence of other specified parts of digestive tract: Secondary | ICD-10-CM | POA: Diagnosis not present

## 2016-05-27 DIAGNOSIS — R200 Anesthesia of skin: Secondary | ICD-10-CM | POA: Diagnosis not present

## 2016-05-27 DIAGNOSIS — E1140 Type 2 diabetes mellitus with diabetic neuropathy, unspecified: Secondary | ICD-10-CM | POA: Insufficient documentation

## 2016-05-27 DIAGNOSIS — Z9889 Other specified postprocedural states: Secondary | ICD-10-CM | POA: Insufficient documentation

## 2016-05-27 LAB — POCT GLYCOSYLATED HEMOGLOBIN (HGB A1C): HEMOGLOBIN A1C: 6.1

## 2016-05-27 MED ORDER — MONTELUKAST SODIUM 10 MG PO TABS
10.0000 mg | ORAL_TABLET | Freq: Every day | ORAL | 1 refills | Status: DC
Start: 2016-05-27 — End: 2016-08-16

## 2016-05-27 MED ORDER — ALBUTEROL SULFATE (2.5 MG/3ML) 0.083% IN NEBU
2.5000 mg | INHALATION_SOLUTION | Freq: Four times a day (QID) | RESPIRATORY_TRACT | 3 refills | Status: DC | PRN
Start: 2016-05-27 — End: 2017-02-18

## 2016-05-27 MED ORDER — CITALOPRAM HYDROBROMIDE 40 MG PO TABS: 40.0000 mg | ORAL_TABLET | Freq: Every day | ORAL | 1 refills | Status: DC

## 2016-05-27 MED ORDER — FLUTICASONE PROPIONATE 50 MCG/ACT NA SUSP: 2.0000 | Freq: Every day | NASAL | 6 refills | Status: DC

## 2016-05-27 MED ORDER — METFORMIN HCL 1000 MG PO TABS: 1000.0000 mg | ORAL_TABLET | Freq: Two times a day (BID) | ORAL | 1 refills | Status: DC

## 2016-05-27 MED ORDER — ALBUTEROL SULFATE HFA 108 (90 BASE) MCG/ACT IN AERS: 1.0000 | INHALATION_SPRAY | Freq: Four times a day (QID) | RESPIRATORY_TRACT | 1 refills | Status: DC | PRN

## 2016-05-27 MED ORDER — MELOXICAM 7.5 MG PO TABS: 7.5000 mg | ORAL_TABLET | Freq: Every day | ORAL | 1 refills | Status: DC

## 2016-05-27 MED ORDER — FLUTICASONE-SALMETEROL 250-50 MCG/DOSE IN AEPB: 1.0000 | INHALATION_SPRAY | Freq: Two times a day (BID) | RESPIRATORY_TRACT | 3 refills | Status: DC

## 2016-05-27 MED ORDER — GABAPENTIN 300 MG PO CAPS: 300.0000 mg | ORAL_CAPSULE | Freq: Two times a day (BID) | ORAL | 1 refills | Status: DC

## 2016-05-27 MED ORDER — ATORVASTATIN CALCIUM 20 MG PO TABS: 20.0000 mg | ORAL_TABLET | Freq: Every day | ORAL | 1 refills | Status: DC

## 2016-05-27 MED FILL — metFORMIN HCL 1000 MG TABS: 1000 | 30 days supply | Qty: 60 | Fill #0

## 2016-05-27 MED FILL — !ADVAIR 250/50 DISKUS: 250-50 | 30 days supply | Qty: 60 | Fill #0

## 2016-05-27 MED FILL — GABAPENTIN 300 MG CAPSULE: 300 | 30 days supply | Qty: 60 | Fill #0

## 2016-05-27 MED FILL — FLUTICASONE PROP 50 MCG SPR: 50 | 30 days supply | Qty: 16 | Fill #0

## 2016-05-27 MED FILL — ALBUTEROL 0.083% INHAL SOLN: (2.5 MG/3ML | 7 days supply | Qty: 90 | Fill #0

## 2016-05-27 MED FILL — ?ATORVASTATIN 20 MG TABLET: 20 | 30 days supply | Qty: 30 | Fill #0

## 2016-05-27 MED FILL — !VENTOLIN HFA INHALER: 108 (90 BAS | 25 days supply | Qty: 18 | Fill #0

## 2016-05-27 NOTE — Patient Instructions (Signed)
Diabetic Neuropathy Diabetic neuropathy is a nerve disease or nerve damage that is caused by diabetes mellitus. About half of all people with diabetes mellitus have some form of nerve damage. Nerve damage is more common in those who have had diabetes mellitus for many years and who generally have not had good control of their blood sugar (glucose) level. Diabetic neuropathy is a common complication of diabetes mellitus. There are three common types of diabetic neuropathy and a fourth type that is less common and less understood:  Peripheral neuropathy-This is the most common type of diabetic neuropathy. It causes damage to the nerves of the feet and legs first and then eventually the hands and arms. The damage affects the ability to sense touch.  Autonomic neuropathy-This type causes damage to the autonomic nervous system, which controls the following functions: ? Heartbeat. ? Body temperature. ? Blood pressure. ? Urination. ? Digestion. ? Sweating. ? Sexual function.  Focal neuropathy-Focal neuropathy can be painful and unpredictable and occurs most often in older adults with diabetes mellitus. It involves a specific nerve or one area and often comes on suddenly. It usually does not cause long-term problems.  Radiculoplexus neuropathy- Sometimes called lumbosacral radiculoplexus neuropathy, radiculoplexus neuropathy affects the nerves of the thighs, hips, buttocks, or legs. It is more common in people with type 2 diabetes mellitus and in older men. It is characterized by debilitating pain, weakness, and atrophy, usually in the thigh muscles.  What are the causes? The cause of peripheral, autonomic, and focal neuropathies is diabetes mellitus that is uncontrolled and high glucose levels. The cause of radiculoplexus neuropathy is unknown. However, it is thought to be caused by inflammation related to uncontrolled glucose levels. What are the signs or symptoms? Peripheral Neuropathy Peripheral  neuropathy develops slowly over time. When the nerves of the feet and legs no longer work there may be:  Burning, stabbing, or aching pain in the legs or feet.  Inability to feel pressure or pain in your feet. This can lead to: ? Thick calluses over pressure areas. ? Pressure sores. ? Ulcers.  Foot deformities.  Reduced ability to feel temperature changes.  Muscle weakness.  Autonomic Neuropathy The symptoms of autonomic neuropathy vary depending on which nerves are affected. Symptoms may include:  Problems with digestion, such as: ? Feeling sick to your stomach (nausea). ? Vomiting. ? Bloating. ? Constipation. ? Diarrhea. ? Abdominal pain.  Difficulty with urination. This occurs if you lose your ability to sense when your bladder is full. Problems include: ? Urine leakage (incontinence). ? Inability to empty your bladder completely (retention).  Rapid or irregular heartbeat (palpitations).  Blood pressure drops when you stand up (orthostatic hypotension). When you stand up you may feel: ? Dizzy. ? Weak. ? Faint.  In men, inability to attain and maintain an erection.  In women, vaginal dryness and problems with decreased sexual desire and arousal.  Problems with body temperature regulation.  Increased or decreased sweating.  Focal Neuropathy  Abnormal eye movements or abnormal alignment of both eyes.  Weakness in the wrist.  Foot drop. This results in an inability to lift the foot properly and abnormal walking or foot movement.  Paralysis on one side of your face (Bell palsy).  Chest or abdominal pain. Radiculoplexus Neuropathy  Sudden, severe pain in your hip, thigh, or buttocks.  Weakness and wasting of thigh muscles.  Difficulty rising from a seated position.  Abdominal swelling.  Unexplained weight loss (usually more than 10 lb [4.5 kg]). How is   this diagnosed? Peripheral Neuropathy Your senses may be tested. Sensory function testing can be  done with:  A light touch using a monofilament.  A vibration with tuning fork.  A sharp sensation with a pin prick.  Other tests that can help diagnose neuropathy are:  Nerve conduction velocity. This test checks the transmission of an electrical current through a nerve.  Electromyography. This shows how muscles respond to electrical signals transmitted by nearby nerves.  Quantitative sensory testing. This is used to assess how your nerves respond to vibrations and changes in temperature.  Autonomic Neuropathy Diagnosis is often based on reported symptoms. Tell your health care provider if you experience:  Dizziness.  Constipation.  Diarrhea.  Inappropriate urination or inability to urinate.  Inability to get or maintain an erection.  Tests that may be done include:  Electrocardiography or Holter monitor. These are tests that can help show problems with the heart rate or heart rhythm.  An X-ray exam may be done.  Focal Neuropathy Diagnosis is made based on your symptoms and what your health care provider finds during your exam. Other tests may be done. They may include:  Nerve conduction velocities. This checks the transmission of electrical current through a nerve.  Electromyography. This shows how muscles respond to electrical signals transmitted by nearby nerves.  Quantitative sensory testing. This test is used to assess how your nerves respond to vibration and changes in temperature.  Radiculoplexus Neuropathy  Often the first thing is to eliminate any other issue or problems that might be the cause, as there is no standard test for diagnosis.  X-ray exam of your spine and lumbar region.  Spinal tap to rule out cancer.  MRI to rule out other lesions. How is this treated? Once nerve damage occurs, it cannot be reversed. The goal of treatment is to keep the disease or nerve damage from getting worse and affecting more nerve fibers. Controlling your blood  glucose level is the key. Most people with radiculoplexus neuropathy see at least a partial improvement over time. You will need to keep your blood glucose and HbA1c levels in the target range determined by your health care provider. Things that help control blood glucose levels include:  Blood glucose monitoring.  Meal planning.  Physical activity.  Diabetes medicine.  Over time, maintaining lower blood glucose levels helps lessen symptoms. Sometimes, prescription pain medicine is needed. Follow these instructions at home:  Do not smoke.  Keep your blood glucose level in the range that you and your health care provider have determined acceptable for you.  Keep your blood pressure level in the range that you and your health care provider have determined acceptable for you.  Eat a well-balanced diet.  Be physically active every day. Include strength training and balance exercises.  Protect your feet. ? Check your feet every day for sores, cuts, blisters, or signs of infection. ? Wear padded socks and supportive shoes. Use orthotic inserts, if necessary. ? Regularly check the insides of your shoes for worn spots. Make sure there are no rocks or other items inside your shoes before you put them on. Contact a health care provider if:  You have burning, stabbing, or aching pain in the legs or feet.  You are unable to feel pressure or pain in your feet.  You develop problems with digestion such as: ? Nausea. ? Vomiting. ? Bloating. ? Constipation. ? Diarrhea. ? Abdominal pain.  You have difficulty with urination, such as: ? Incontinence. ? Retention.    You have palpitations.  You develop orthostatic hypotension. When you stand up you may feel: ? Dizzy. ? Weak. ? Faint.  You cannot attain and maintain an erection (in men).  You have vaginal dryness and problems with decreased sexual desire and arousal (in women).  You have severe pain in your thighs, legs, or  buttocks.  You have unexplained weight loss. This information is not intended to replace advice given to you by your health care provider. Make sure you discuss any questions you have with your health care provider. Document Released: 04/08/2001 Document Revised: 07/06/2015 Document Reviewed: 07/09/2012 Elsevier Interactive Patient Education  2017 [employer] Inc[employer]  

## 2016-05-27 NOTE — Progress Notes (Signed)
Subjective:  Patient ID: Janet [name], female    DOB: 27-Sep-1970  Age: 46 y.o. MRN: [id number]  CC: Leg Pain (RIGHT LEG) and Numbness   HPI Janet [name] is a 46 year old female with a history of type 2 diabetes mellitus (A1c 6.1), hypothyroidism, asthma, Seasonal allergies, morbid obesity who presents today for a follow-up visit.  Her asthma has been controlled and she denies any acute flares. She reports resolution of the sinus symptoms which she previously had over the last couple of months.  Endorses compliance with metformin and a diabetic diet but she does not exercise. Denies hypoglycemia or visual symptoms. Complains of new onset of numbness in her right foot but denies numbness in her hands. She complains of 4-5 day history of right leg pain in the lower third of her leg exacerbated by pushing on the gas pedal. It also occurs when she moves wrong or bears weight on her right foot unconsciously and this brings tears to her eyes. Denies history of trauma, swelling or bruising. She has no cough pain.  Past Medical History:  Diagnosis Date  . Asthma   . Depression   . Diabetes mellitus without complication ([place])   . Thyroid disease     Past Surgical History:  Procedure Laterality Date  . CARPAL TUNNEL RELEASE    . CESAREAN SECTION    . CHOLECYSTECTOMY    . DILATION AND CURETTAGE OF UTERUS    . SINUS EXPLORATION      Allergies  Allergen Reactions  . Ciprofloxacin Shortness Of Breath     Outpatient Medications Prior to Visit  Medication Sig Dispense Refill  . HYDROcodone-acetaminophen (NORCO/VICODIN) 5-325 MG tablet Take 1 tablet by mouth every 6 (six) hours as needed for moderate pain.    Marland Kitchen levothyroxine (SYNTHROID, LEVOTHROID) 100 MCG tablet Take 2 tablets (200 mcg total) by mouth daily before breakfast. 60 tablet 3  . megestrol (MEGACE) 40 MG tablet Take 2 tablets (80 mg total) by mouth 3 (three) times daily. 90 tablet 1  . albuterol (PROVENTIL  HFA;VENTOLIN HFA) 108 (90 Base) MCG/ACT inhaler Inhale 1-2 puffs into the lungs every 6 (six) hours as needed for wheezing or shortness of breath. 3 Inhaler 1  . albuterol (PROVENTIL) (2.5 MG/3ML) 0.083% nebulizer solution Take 3 mLs (2.5 mg total) by nebulization every 6 (six) hours as needed for wheezing or shortness of breath. 150 mL 1  . citalopram (CELEXA) 40 MG tablet Take 1 tablet (40 mg total) by mouth daily. 90 tablet 1  . fluticasone (FLONASE) 50 MCG/ACT nasal spray Place 2 sprays into both nostrils daily. 16 g 6  . Fluticasone-Salmeterol (ADVAIR DISKUS) 250-50 MCG/DOSE AEPB Inhale 1 puff into the lungs 2 (two) times daily. 1 each 3  . metFORMIN (GLUCOPHAGE) 1000 MG tablet Take 1 tablet (1,000 mg total) by mouth 2 (two) times daily with a meal. (Patient not taking: Reported on 05/07/2016) 180 tablet 1  . montelukast (SINGULAIR) 10 MG tablet Take 1 tablet (10 mg total) by mouth at bedtime. 30 tablet 3   No facility-administered medications prior to visit.     ROS Review of Systems  Constitutional: Negative for activity change, appetite change and fatigue.  HENT: Negative for congestion, sinus pressure and sore throat.   Eyes: Negative for visual disturbance.  Respiratory: Negative for cough, chest tightness, shortness of breath and wheezing.   Cardiovascular: Negative for chest pain and palpitations.  Gastrointestinal: Negative for abdominal distention, abdominal pain and constipation.  Endocrine: Negative for polydipsia.  Genitourinary: Negative for dysuria and frequency.  Musculoskeletal:       See hpi  Skin: Negative for rash.  Neurological: Positive for numbness. Negative for tremors and light-headedness.  Hematological: Does not bruise/bleed easily.  Psychiatric/Behavioral: Negative for agitation and behavioral problems.    Objective:  BP 138/78 (BP Location: Right Arm, Patient Position: Sitting, Cuff Size: Large)   Pulse 69   Temp 98.4 F (36.9 C) (Oral)   Ht 5\' 9"   (1.753 m)   Wt (!) 364 lb (165.1 kg)   LMP  (LMP Unknown) Comment: irregular periods previously, bleeding started in january  SpO2 98%   BMI 53.75 kg/m   BP/Weight 05/27/2016 05/08/2016 1/82/9937  Systolic BP 169 678 938  Diastolic BP 78 55 55  Wt. (Lbs) 364 367.8 -  BMI 53.75 54.31 -      Physical Exam  Constitutional: She is oriented to person, place, and time. She appears well-developed and well-nourished.  Morbidly obese  Cardiovascular: Normal rate, normal heart sounds and intact distal pulses.   No murmur heard. Pulmonary/Chest: Effort normal and breath sounds normal. She has no wheezes. She has no rales. She exhibits no tenderness.  Abdominal: Soft. Bowel sounds are normal. She exhibits no distension and no mass. There is no tenderness.  Musculoskeletal: Normal range of motion.  Neurological: She is alert and oriented to person, place, and time.   Lab Results  Component Value Date   HGBA1C 6.1 05/27/2016    CMP Latest Ref Rng & Units 05/27/2016 05/07/2016 04/22/2016  Glucose 65 - 99 mg/dL 138(H) 103(H) 125(H)  BUN 6 - 24 mg/dL 8 9 7   Creatinine 0.57 - 1.00 mg/dL 0.80 0.71 0.71  Sodium 134 - 144 mmol/L 143 138 141  Potassium 3.5 - 5.2 mmol/L 4.3 3.4(L) 3.9  Chloride 96 - 106 mmol/L 107(H) 107 109  CO2 18 - 29 mmol/L 21 24 25   Calcium 8.7 - 10.2 mg/dL 8.8 8.7(L) 8.5(L)  Total Protein 6.5 - 8.1 g/dL - 7.6 -  Total Bilirubin 0.3 - 1.2 mg/dL - 0.7 -  Alkaline Phos 38 - 126 U/L - 78 -  AST 15 - 41 U/L - 28 -  ALT 14 - 54 U/L - 21 -    Lipid Panel     Component Value Date/Time   CHOL 152 12/26/2015 0929   TRIG 73 12/26/2015 0929   HDL 74 12/26/2015 0929   CHOLHDL 2.1 12/26/2015 0929   VLDL 15 12/26/2015 0929   LDLCALC 63 12/26/2015 0929     Assessment & Plan:   1. Type 2 diabetes mellitus without complication, without long-term current use of insulin (HCC) Controlled with A1c of 6.1 Statin added due to increased cardiovascular risk - HgB A1c - metFORMIN  (GLUCOPHAGE) 1000 MG tablet; Take 1 tablet (1,000 mg total) by mouth 2 (two) times daily with a meal.  Dispense: 180 tablet; Refill: 1 - Basic Metabolic Panel  2. Moderate persistent asthma without complication Stable, no acute exacerbation - Fluticasone-Salmeterol (ADVAIR DISKUS) 250-50 MCG/DOSE AEPB; Inhale 1 puff into the lungs 2 (two) times daily.  Dispense: 1 each; Refill: 3 - albuterol (PROVENTIL) (2.5 MG/3ML) 0.083% nebulizer solution; Take 3 mLs (2.5 mg total) by nebulization every 6 (six) hours as needed for wheezing or shortness of breath.  Dispense: 75 mL; Refill: 3 - albuterol (PROVENTIL HFA;VENTOLIN HFA) 108 (90 Base) MCG/ACT inhaler; Inhale 1-2 puffs into the lungs every 6 (six) hours as needed for wheezing or shortness of breath.  Dispense: 3 Inhaler;  Refill: 1  3. Ethmoid sinusitis, unspecified chronicity No acute flare - fluticasone (FLONASE) 50 MCG/ACT nasal spray; Place 2 sprays into both nostrils daily.  Dispense: 16 g; Refill: 6  4. Moderate episode of recurrent major depressive disorder (HCC) Uncontrolled - citalopram (CELEXA) 40 MG tablet; Take 1 tablet (40 mg total) by mouth daily.  Dispense: 90 tablet; Refill: 1  5. Hypothyroidism, unspecified type Last thyroid labs were normal Repeat - TSH  6. Other diabetic neurological complication associated with type 2 diabetes mellitus (HCC) Comments gabapentin Discussed side effects - gabapentin (NEURONTIN) 300 MG capsule; Take 1 capsule (300 mg total) by mouth 2 (two) times daily.  Dispense: 180 capsule; Refill: 1  7. Pain of right lower extremity Unknown etiology If pain persist will need imaging - meloxicam (MOBIC) 7.5 MG tablet; Take 1 tablet (7.5 mg total) by mouth daily.  Dispense: 30 tablet; Refill: 1   Meds ordered this encounter  Medications  . montelukast (SINGULAIR) 10 MG tablet    Sig: Take 1 tablet (10 mg total) by mouth at bedtime.    Dispense:  90 tablet    Refill:  1  . metFORMIN (GLUCOPHAGE) 1000  MG tablet    Sig: Take 1 tablet (1,000 mg total) by mouth 2 (two) times daily with a meal.    Dispense:  180 tablet    Refill:  1  . Fluticasone-Salmeterol (ADVAIR DISKUS) 250-50 MCG/DOSE AEPB    Sig: Inhale 1 puff into the lungs 2 (two) times daily.    Dispense:  1 each    Refill:  3  . fluticasone (FLONASE) 50 MCG/ACT nasal spray    Sig: Place 2 sprays into both nostrils daily.    Dispense:  16 g    Refill:  6  . albuterol (PROVENTIL) (2.5 MG/3ML) 0.083% nebulizer solution    Sig: Take 3 mLs (2.5 mg total) by nebulization every 6 (six) hours as needed for wheezing or shortness of breath.    Dispense:  75 mL    Refill:  3  . citalopram (CELEXA) 40 MG tablet    Sig: Take 1 tablet (40 mg total) by mouth daily.    Dispense:  90 tablet    Refill:  1  . albuterol (PROVENTIL HFA;VENTOLIN HFA) 108 (90 Base) MCG/ACT inhaler    Sig: Inhale 1-2 puffs into the lungs every 6 (six) hours as needed for wheezing or shortness of breath.    Dispense:  3 Inhaler    Refill:  1  . gabapentin (NEURONTIN) 300 MG capsule    Sig: Take 1 capsule (300 mg total) by mouth 2 (two) times daily.    Dispense:  180 capsule    Refill:  1  . meloxicam (MOBIC) 7.5 MG tablet    Sig: Take 1 tablet (7.5 mg total) by mouth daily.    Dispense:  30 tablet    Refill:  1    Follow-up: 3 months, follow-up on diabetes mellitus   Arnoldo Morale MD

## 2016-05-28 LAB — BASIC METABOLIC PANEL
BUN / CREAT RATIO: 10 (ref 9–23)
BUN: 8 mg/dL (ref 6–24)
CO2: 21 mmol/L (ref 18–29)
Calcium: 8.8 mg/dL (ref 8.7–10.2)
Chloride: 107 mmol/L — ABNORMAL HIGH (ref 96–106)
Creatinine, Ser: 0.8 mg/dL (ref 0.57–1.00)
GFR, EST AFRICAN AMERICAN: 102 mL/min/{1.73_m2} (ref >59)
GFR, EST NON AFRICAN AMERICAN: 88 mL/min/{1.73_m2} (ref >59)
GLUCOSE: 138 mg/dL — AB (ref 65–99)
Potassium: 4.3 mmol/L (ref 3.5–5.2)
Sodium: 143 mmol/L (ref 134–144)

## 2016-05-28 LAB — TSH: TSH: 2.15 u[IU]/mL (ref 0.450–4.500)

## 2016-05-31 ENCOUNTER — Encounter: Payer: Self-pay | Admitting: Obstetrics & Gynecology

## 2016-06-03 ENCOUNTER — Telehealth: Payer: Self-pay | Admitting: Family Medicine

## 2016-06-03 ENCOUNTER — Telehealth: Payer: Self-pay

## 2016-06-03 MED ORDER — AMOXICILLIN-POT CLAVULANATE 875-125 MG PO TABS: 1.0000 | ORAL_TABLET | Freq: Two times a day (BID) | ORAL | 0 refills | Status: DC

## 2016-06-03 NOTE — Telephone Encounter (Signed)
I had sent her a 'my chart' message stating her labs were normal. Augmentin sent to pharmacy.

## 2016-06-03 NOTE — Telephone Encounter (Signed)
Pt called and stated that she is having dark brown discharge and wants to know if it is normal?

## 2016-06-03 NOTE — Telephone Encounter (Signed)
Pt calling to request script for sinus infection accompanied by green mucous. Please f/u with pt. Also inquiring about lab results.

## 2016-06-04 NOTE — Telephone Encounter (Signed)
Writer LVM regarding her lab results and medication sent to her [facility] for a sinus infection.

## 2016-06-05 NOTE — Patient Instructions (Signed)
Your procedure is scheduled on:  Tuesday, Jun 11, 2016  Enter through the [employer] [facility] at:  7:00 AM  Pick up the phone at the desk and dial [phone].  Call this number if you have problems the morning of surgery: [phone].  Remember: Do NOT eat food or drink after:  Midnight Monday  Take these medicines the morning of surgery with a SIP OF WATER:  Levothyroxine, Use Asthma Inhaler per normal routine  Bring Rescue Asthma Inhaler day of surgery  Stop ALL herbal medications at this time  Do NOT smoke the day of surgery.  Do NOT wear jewelry (body piercing), metal hair clips/bobby pins, make-up, or nail polish. Do NOT wear lotions, powders, or perfumes.  You may wear deodorant. Do NOT shave for 48 hours prior to surgery. Do NOT bring valuables to the hospital. Contacts, dentures, or bridgework may not be worn into surgery.  Have a responsible adult drive you home and stay with you for 24 hours after your procedure  Bring a copy of your healthcare power of attorney and living will documents.

## 2016-06-06 ENCOUNTER — Encounter (HOSPITAL_COMMUNITY): Payer: Self-pay

## 2016-06-06 ENCOUNTER — Encounter (HOSPITAL_COMMUNITY)
Admission: RE | Admit: 2016-06-06 | Discharge: 2016-06-06 | Disposition: A | Discharge status: Home / Self Care | Payer: Medicaid Other | Source: Ambulatory Visit | Attending: Obstetrics & Gynecology | Admitting: Obstetrics & Gynecology

## 2016-06-06 DIAGNOSIS — Z01812 Encounter for preprocedural laboratory examination: Secondary | ICD-10-CM | POA: Insufficient documentation

## 2016-06-06 HISTORY — DX: Anemia, unspecified: D64.9

## 2016-06-06 HISTORY — DX: Sleep apnea, unspecified: G47.30

## 2016-06-06 HISTORY — DX: Hypothyroidism, unspecified: E03.9

## 2016-06-06 HISTORY — DX: Prediabetes: R73.03

## 2016-06-06 LAB — CBC
HEMATOCRIT: 31.7 % — AB (ref 36.0–46.0)
HEMOGLOBIN: 10 g/dL — AB (ref 12.0–15.0)
MCH: 23.9 pg — AB (ref 26.0–34.0)
MCHC: 31.5 g/dL (ref 30.0–36.0)
MCV: 75.8 fL — AB (ref 78.0–100.0)
Platelets: 140 10*3/uL — ABNORMAL LOW (ref 150–400)
RBC: 4.18 MIL/uL (ref 3.87–5.11)
RDW: 15.2 % (ref 11.5–15.5)
WBC: 4.6 10*3/uL (ref 4.0–10.5)

## 2016-06-06 NOTE — Telephone Encounter (Signed)
Attempted calling patient. There was no answer. Voice mail left stating I am returning her call. Please return my call at the clinic.

## 2016-06-07 NOTE — Telephone Encounter (Signed)
Attempted to call the patient, no answer at the home number [phone]. I then called the cell number [phone] no answer, but I left a message. I stated that I  was Sharee Pimple from [facility] returning her call. Also noticed that the patient will be having surgery on 06/11/2016 with Dr.Dove.

## 2016-06-11 ENCOUNTER — Ambulatory Visit (HOSPITAL_COMMUNITY): Payer: Medicaid Other | Admitting: Certified Registered Nurse Anesthetist

## 2016-06-11 ENCOUNTER — Encounter (HOSPITAL_COMMUNITY)
Admission: RE | Disposition: A | Discharge status: Home / Self Care | Payer: Self-pay | Source: Ambulatory Visit | Attending: Obstetrics & Gynecology

## 2016-06-11 ENCOUNTER — Encounter (HOSPITAL_COMMUNITY): Payer: Self-pay | Admitting: Anesthesiology

## 2016-06-11 ENCOUNTER — Ambulatory Visit (HOSPITAL_COMMUNITY)
Admission: RE | Admit: 2016-06-11 | Discharge: 2016-06-11 | Disposition: A | Discharge status: Home / Self Care | Payer: Medicaid Other | Source: Ambulatory Visit | Attending: Obstetrics & Gynecology | Admitting: Obstetrics & Gynecology

## 2016-06-11 DIAGNOSIS — Z6841 Body Mass Index (BMI) 40.0 and over, adult: Secondary | ICD-10-CM | POA: Diagnosis not present

## 2016-06-11 DIAGNOSIS — J45909 Unspecified asthma, uncomplicated: Secondary | ICD-10-CM | POA: Diagnosis not present

## 2016-06-11 DIAGNOSIS — E1140 Type 2 diabetes mellitus with diabetic neuropathy, unspecified: Secondary | ICD-10-CM | POA: Diagnosis not present

## 2016-06-11 DIAGNOSIS — Z9049 Acquired absence of other specified parts of digestive tract: Secondary | ICD-10-CM | POA: Insufficient documentation

## 2016-06-11 DIAGNOSIS — N938 Other specified abnormal uterine and vaginal bleeding: Secondary | ICD-10-CM | POA: Insufficient documentation

## 2016-06-11 DIAGNOSIS — E039 Hypothyroidism, unspecified: Secondary | ICD-10-CM | POA: Insufficient documentation

## 2016-06-11 DIAGNOSIS — F329 Major depressive disorder, single episode, unspecified: Secondary | ICD-10-CM | POA: Diagnosis not present

## 2016-06-11 DIAGNOSIS — N8502 Endometrial intraepithelial neoplasia [EIN]: Secondary | ICD-10-CM | POA: Diagnosis not present

## 2016-06-11 DIAGNOSIS — Z7984 Long term (current) use of oral hypoglycemic drugs: Secondary | ICD-10-CM | POA: Insufficient documentation

## 2016-06-11 DIAGNOSIS — G4730 Sleep apnea, unspecified: Secondary | ICD-10-CM | POA: Diagnosis not present

## 2016-06-11 DIAGNOSIS — Z79899 Other long term (current) drug therapy: Secondary | ICD-10-CM | POA: Diagnosis not present

## 2016-06-11 HISTORY — PX: DILATION AND CURETTAGE OF UTERUS: SHX78

## 2016-06-11 LAB — PREGNANCY, URINE: PREG TEST UR: NEGATIVE

## 2016-06-11 SURGERY — EXAM UNDER ANESTHESIA
Anesthesia: General | Site: Vagina

## 2016-06-11 MED ORDER — MIDAZOLAM HCL 2 MG/2ML IJ SOLN
INTRAMUSCULAR | Status: AC
  Filled 2016-06-11: qty 2

## 2016-06-11 MED ORDER — LIDOCAINE HCL (CARDIAC) 20 MG/ML IV SOLN
INTRAVENOUS | Status: AC
  Filled 2016-06-11: qty 5

## 2016-06-11 MED ORDER — LACTATED RINGERS IV SOLN
INTRAVENOUS | Status: DC
  Administered 2016-06-11 (×2): via INTRAVENOUS

## 2016-06-11 MED ORDER — LIDOCAINE HCL (CARDIAC) 20 MG/ML IV SOLN
INTRAVENOUS | Status: DC | PRN
  Administered 2016-06-11: 30 mg via INTRAVENOUS
  Administered 2016-06-11: 70 mg via INTRAVENOUS

## 2016-06-11 MED ORDER — BUPIVACAINE HCL 0.5 % IJ SOLN
INTRAMUSCULAR | Status: DC | PRN
  Administered 2016-06-11: 10 mL

## 2016-06-11 MED ORDER — DEXAMETHASONE SODIUM PHOSPHATE 4 MG/ML IJ SOLN
INTRAMUSCULAR | Status: AC
  Filled 2016-06-11: qty 1

## 2016-06-11 MED ORDER — SCOPOLAMINE 1 MG/3DAYS TD PT72
1.0000 | MEDICATED_PATCH | Freq: Once | TRANSDERMAL | Status: DC
  Administered 2016-06-11: 1.5 mg via TRANSDERMAL

## 2016-06-11 MED ORDER — SCOPOLAMINE 1 MG/3DAYS TD PT72
MEDICATED_PATCH | TRANSDERMAL | Status: AC
  Filled 2016-06-11: qty 1

## 2016-06-11 MED ORDER — KETOROLAC TROMETHAMINE 30 MG/ML IJ SOLN
INTRAMUSCULAR | Status: DC | PRN
  Administered 2016-06-11: 30 mg via INTRAVENOUS

## 2016-06-11 MED ORDER — PROPOFOL 10 MG/ML IV BOLUS
INTRAVENOUS | Status: DC | PRN
  Administered 2016-06-11: 200 mg via INTRAVENOUS

## 2016-06-11 MED ORDER — IBUPROFEN 600 MG PO TABS: 600.0000 mg | ORAL_TABLET | Freq: Four times a day (QID) | ORAL | 1 refills | Status: DC | PRN

## 2016-06-11 MED ORDER — KETOROLAC TROMETHAMINE 30 MG/ML IJ SOLN
INTRAMUSCULAR | Status: AC
  Filled 2016-06-11: qty 1

## 2016-06-11 MED ORDER — PROPOFOL 10 MG/ML IV BOLUS
INTRAVENOUS | Status: AC
  Filled 2016-06-11: qty 40

## 2016-06-11 MED ORDER — FENTANYL CITRATE (PF) 100 MCG/2ML IJ SOLN
INTRAMUSCULAR | Status: AC
  Filled 2016-06-11: qty 2

## 2016-06-11 MED ORDER — FENTANYL CITRATE (PF) 100 MCG/2ML IJ SOLN: 25.0000 ug | INTRAMUSCULAR | Status: DC | PRN

## 2016-06-11 MED ORDER — ONDANSETRON HCL 4 MG/2ML IJ SOLN
INTRAMUSCULAR | Status: AC
  Filled 2016-06-11: qty 2

## 2016-06-11 MED ORDER — ONDANSETRON HCL 4 MG/2ML IJ SOLN
INTRAMUSCULAR | Status: DC | PRN
  Administered 2016-06-11: 4 mg via INTRAVENOUS

## 2016-06-11 MED ORDER — MIDAZOLAM HCL 2 MG/2ML IJ SOLN
INTRAMUSCULAR | Status: DC | PRN
  Administered 2016-06-11: 1 mg via INTRAVENOUS

## 2016-06-11 MED ORDER — HYDROCODONE-ACETAMINOPHEN 7.5-325 MG PO TABS: 1.0000 | ORAL_TABLET | Freq: Once | ORAL | Status: DC | PRN

## 2016-06-11 MED ORDER — DEXAMETHASONE SODIUM PHOSPHATE 10 MG/ML IJ SOLN
INTRAMUSCULAR | Status: DC | PRN
  Administered 2016-06-11: 4 mg via INTRAVENOUS

## 2016-06-11 MED ORDER — BUPIVACAINE HCL (PF) 0.5 % IJ SOLN
INTRAMUSCULAR | Status: AC
  Filled 2016-06-11: qty 30

## 2016-06-11 MED ORDER — METOCLOPRAMIDE HCL 5 MG/ML IJ SOLN: 10.0000 mg | Freq: Once | INTRAMUSCULAR | Status: DC | PRN

## 2016-06-11 MED ORDER — FENTANYL CITRATE (PF) 100 MCG/2ML IJ SOLN
INTRAMUSCULAR | Status: DC | PRN
  Administered 2016-06-11 (×2): 50 ug via INTRAVENOUS

## 2016-06-11 MED ORDER — MEPERIDINE HCL 25 MG/ML IJ SOLN: 6.2500 mg | INTRAMUSCULAR | Status: DC | PRN

## 2016-06-11 SURGICAL SUPPLY — 13 items
CLOTH BEACON ORANGE TIMEOUT ST (SAFETY) ×2 IMPLANT
CONTAINER PREFILL 10% NBF 60ML (FORM) ×2 IMPLANT
COVER BACK TABLE 60X90IN (DRAPES) ×2 IMPLANT
DILATOR CANAL MILEX (MISCELLANEOUS) IMPLANT
GLOVE BIO SURGEON STRL SZ 6.5 (GLOVE) ×2 IMPLANT
GLOVE BIOGEL PI IND STRL 7.0 (GLOVE) ×1 IMPLANT
GLOVE BIOGEL PI INDICATOR 7.0 (GLOVE) ×1
GOWN STRL REUS W/TWL LRG LVL3 (GOWN DISPOSABLE) ×4 IMPLANT
NEEDLE SPNL 18GX3.5 QUINCKE PK (NEEDLE) ×2 IMPLANT
PACK VAGINAL MINOR WOMEN LF (CUSTOM PROCEDURE TRAY) ×2 IMPLANT
PAD OB MATERNITY 4.3X12.25 (PERSONAL CARE ITEMS) ×2 IMPLANT
PAD PREP 24X48 CUFFED NSTRL (MISCELLANEOUS) ×2 IMPLANT
TOWEL OR 17X24 6PK STRL BLUE (TOWEL DISPOSABLE) ×4 IMPLANT

## 2016-06-11 NOTE — Anesthesia Procedure Notes (Signed)
Procedure Name: LMA Insertion Date/Time: 06/11/2016 8:49 AM Performed by: Tobin Chad Pre-anesthesia Checklist: Patient identified, Suction available, Emergency Drugs available and Patient being monitored Patient Re-evaluated:Patient Re-evaluated prior to inductionOxygen Delivery Method: Circle system utilized and Simple face mask Preoxygenation: Pre-oxygenation with 100% oxygen Intubation Type: IV induction Ventilation: Mask ventilation without difficulty LMA Size: 4.0 Grade View: Grade II Number of attempts: 1 Placement Confirmation: positive ETCO2 and breath sounds checked- equal and bilateral Dental Injury: Teeth and Oropharynx as per pre-operative assessment

## 2016-06-11 NOTE — Op Note (Signed)
06/11/2016  9:06 AM  PATIENT:  Janet [name]  46 y.o. female  PRE-OPERATIVE DIAGNOSIS:  DUB, unable to tolerate in- office exam, morbid obesity  POST-OPERATIVE DIAGNOSIS:  same  PROCEDURE:  Procedure(s): EXAM UNDER ANESTHESIA (N/A) DILATATION AND CURETTAGE (N/A)  THIN PREP PAP SMEAR  SURGEON:  Surgeon(s) and Role:    * Emily Filbert, MD - Primary  PHYSICIAN ASSISTANT:   ASSISTANTS: none   ANESTHESIA:  general  EBL:  Total I/O In: 1000 [I.V.:1000] Out: -   BLOOD ADMINISTERED:none  DRAINS: none   LOCAL MEDICATIONS USED:  MARCAINE     SPECIMEN:  Source of Specimen:  uterine curettings, pap smear  DISPOSITION OF SPECIMEN:  PATHOLOGY  COUNTS:  YES  TOURNIQUET:  * No tourniquets in log *  DICTATION: .Dragon Dictation  PLAN OF CARE: Discharge to home after PACU  PATIENT DISPOSITION:  PACU - hemodynamically stable.   Delay start of Pharmacological VTE agent (>24hrs) due to surgical blood loss or risk of bleeding: not applicable    The risks, benefits, and alternatives of surgery were explained, understood, and accepted. All questions were answered. Consents were signed. In the operating room MAC anesthesia was applied without complication, and she was placed in the dorsal lithotomy position. A time out procedure was done. A pap smear was obtained.  Her vagina was prepped and draped in the usual sterile fashion.  A speculum was placed and a single-tooth tenaculum was used to grasp the anterior lip of her cervix. A total of 100 mL of 0.5% Marcaine was used to perform a paracervical block. Her uterus sounded to 10 cm. Her cervix was carefully and slowly dilated to accommodate a small curette. A curettage was done in all quadrants and the fundus of the uterus. A moderate amount of  tissue was obtained. A gritty sensation was appreciated throughout. There was no bleeding noted at the end of the case. She was taken to the recovery room after being extubated. She tolerated the  procedure well.

## 2016-06-11 NOTE — Anesthesia Postprocedure Evaluation (Signed)
Anesthesia Post Note  Patient: Shearon Clonch  Procedure(s) Performed: Procedure(s) (LRB): EXAM UNDER ANESTHESIA (N/A) DILATATION AND CURETTAGE (N/A)  Patient location during evaluation: PACU Anesthesia Type: General Level of consciousness: awake and alert and oriented Pain management: pain level controlled Vital Signs Assessment: post-procedure vital signs reviewed and stable Respiratory status: spontaneous breathing, nonlabored ventilation and respiratory function stable Cardiovascular status: blood pressure returned to baseline and stable Postop Assessment: no signs of nausea or vomiting Anesthetic complications: no        Last Vitals:  Vitals:   06/11/16 0709 06/11/16 0915  BP: (!) 151/64   Pulse: 71   Resp: 20   Temp: 36.8 C 37.1 C    Last Pain:  Vitals:   06/11/16 0709  TempSrc: Oral   Pain Goal: Patients Stated Pain Goal: 4 (06/11/16 0709)               Jaleeyah Munce A.

## 2016-06-11 NOTE — Anesthesia Preprocedure Evaluation (Signed)
Anesthesia Evaluation  Patient identified by MRN, date of birth, ID band Patient awake    Reviewed: Allergy & Precautions, NPO status , Patient's Chart, lab work & pertinent test results  Airway Mallampati: III  TM Distance: >3 FB Neck ROM: Full    Dental no notable dental hx. (+) Teeth Intact   Pulmonary asthma , sleep apnea and Continuous Positive Airway Pressure Ventilation ,    Pulmonary exam normal breath sounds clear to auscultation       Cardiovascular negative cardio ROS Normal cardiovascular exam Rhythm:Regular Rate:Normal     Neuro/Psych PSYCHIATRIC DISORDERS Depression Neuropathy    GI/Hepatic   Endo/Other  diabetes, Well Controlled, Type 2, Oral Hypoglycemic AgentsHypothyroidism Morbid obesityHyperlipidemia  Renal/GU   negative genitourinary   Musculoskeletal  (+) Arthritis ,   Abdominal (+) + obese,   Peds  Hematology  (+) anemia , Thrombocytopenia   Anesthesia Other Findings   Reproductive/Obstetrics PMB                             Anesthesia Physical Anesthesia Plan  ASA: III  Anesthesia Plan: General   Post-op Pain Management:    Induction: Intravenous  Airway Management Planned: LMA  Additional Equipment:   Intra-op Plan:   Post-operative Plan: Extubation in OR  Informed Consent: I have reviewed the patients History and Physical, chart, labs and discussed the procedure including the risks, benefits and alternatives for the proposed anesthesia with the patient or authorized representative who has indicated his/her understanding and acceptance.   Dental advisory given  Plan Discussed with: CRNA, Anesthesiologist and Surgeon  Anesthesia Plan Comments:         Anesthesia Quick Evaluation

## 2016-06-11 NOTE — Discharge Instructions (Addendum)
DISCHARGE INSTRUCTIONS: D&C  °The following instructions have been prepared to help you care for yourself upon your return home. °  °Personal hygiene: °• Use sanitary pads for vaginal drainage, not tampons. °• Shower the day after your procedure. °• NO tub baths, pools or Jacuzzis for 2-3 weeks. °• Wipe front to back after using the bathroom. ° °Activity and limitations: °• Do NOT drive or operate any equipment for 24 hours. The effects of anesthesia are still present and drowsiness may result. °• Do NOT rest in bed all day. °• Walking is encouraged. °• Walk up and down stairs slowly. °• You may resume your normal activity in one to two days or as indicated by your physician. ° °Sexual activity: NO intercourse for at least 2 weeks after the procedure, or as indicated by your physician. ° °Diet: Eat a light meal as desired this evening. You may resume your usual diet tomorrow. ° °Return to work: You may resume your work activities in one to two days or as indicated by your doctor. ° °What to expect after your surgery: Expect to have vaginal bleeding/discharge for 2-3 days and spotting for up to 10 days. It is not unusual to have soreness for up to 1-2 weeks. You may have a slight burning sensation when you urinate for the first day. Mild cramps may continue for a couple of days. You may have a regular period in 2-6 weeks. ° °Call your doctor for any of the following: °• Excessive vaginal bleeding, saturating and changing one pad every hour. °• Inability to urinate 6 hours after discharge from hospital. °• Pain not relieved by pain medication. °• Fever of 100.4° F or greater. °• Unusual vaginal discharge or odor. ° ° Call for an appointment:  ° ° °Patient’s signature: ______________________ ° °Nurse’s signature ________________________ ° °Support person's signature_______________________ ° ° ° °

## 2016-06-11 NOTE — Transfer of Care (Signed)
Immediate Anesthesia Transfer of Care Note  Patient: Janet [name]  Procedure(s) Performed: Procedure(s): EXAM UNDER ANESTHESIA (N/A) DILATATION AND CURETTAGE (N/A)  Patient Location: PACU  Anesthesia Type:General  Level of Consciousness: awake, alert , oriented and patient cooperative  Airway & Oxygen Therapy: Patient Spontanous Breathing and Patient connected to nasal cannula oxygen  Post-op Assessment: Report given to RN and Post -op Vital signs reviewed and stable  Post vital signs: Reviewed and stable  Last Vitals:  Vitals:   06/11/16 0709  BP: (!) 151/64  Pulse: 71  Resp: 20  Temp: 36.8 C    Last Pain:  Vitals:   06/11/16 0709  TempSrc: Oral      Patients Stated Pain Goal: 4 (49/75/30 0511)  Complications: No apparent anesthesia complications

## 2016-06-11 NOTE — Telephone Encounter (Signed)
Patient is in hospital today with Dr [name] for exam under anesthesia

## 2016-06-11 NOTE — H&P (Signed)
Janet [name] is an 46 yo MW P1 (46 yo boy) here today for [facility] due to DUB for about 10 years. Periods are sometimes skipped and then she may bleed for a month. Most recently I gave her megace and that has stopped her bleeding.   Review of Systems She has been having unprotected sex forever She works at [employer]. U/S showed a thickened endometrium at 2.9 cm No fibroids She is currently taking megace since MAU visit last night. HBG 9.8    Objective:   Physical Exam Pleasant morbidly obese WFNAD She was unable to tolerate even a speculum exam so I was unable to do the pap or EMBX      No LMP recorded.    Past Medical History:  Diagnosis Date  . Anemia   . Asthma   . Depression   . Hypothyroidism   . Pre-diabetes   . Sleep apnea   . Thyroid disease     Past Surgical History:  Procedure Laterality Date  . CARPAL TUNNEL RELEASE    . CESAREAN SECTION    . CHOLECYSTECTOMY    . cyst removal     uterus  . DILATION AND CURETTAGE OF UTERUS     still birth  . SINUS EXPLORATION    . WISDOM TOOTH EXTRACTION      History reviewed. No pertinent family history.  Social History:  reports that she has never smoked. She has never used smokeless tobacco. She reports that she does not drink alcohol or use drugs.  Allergies:  Allergies  Allergen Reactions  . Ciprofloxacin Shortness Of Breath    Prescriptions Prior to Admission  Medication Sig Dispense Refill Last Dose  . cetirizine (ZYRTEC) 10 MG tablet Take 10 mg by mouth daily.   06/10/2016 at Unknown time  . citalopram (CELEXA) 40 MG tablet Take 1 tablet (40 mg total) by mouth daily. (Patient taking differently: Take 40 mg by mouth at bedtime. ) 90 tablet 1 06/10/2016 at Unknown time  . Fluticasone-Salmeterol (ADVAIR DISKUS) 250-50 MCG/DOSE AEPB Inhale 1 puff into the lungs 2 (two) times daily. 1 each 3 06/11/2016 at Unknown time  . levothyroxine (SYNTHROID, LEVOTHROID) 100 MCG tablet Take 2 tablets (200 mcg total)  by mouth daily before breakfast. 60 tablet 3 06/10/2016 at Unknown time  . meloxicam (MOBIC) 7.5 MG tablet Take 1 tablet (7.5 mg total) by mouth daily. (Patient taking differently: Take 7.5 mg by mouth daily as needed for pain. ) 30 tablet 1 Past Week at Unknown time  . montelukast (SINGULAIR) 10 MG tablet Take 1 tablet (10 mg total) by mouth at bedtime. 90 tablet 1 06/10/2016 at Unknown time  . albuterol (PROVENTIL HFA;VENTOLIN HFA) 108 (90 Base) MCG/ACT inhaler Inhale 1-2 puffs into the lungs every 6 (six) hours as needed for wheezing or shortness of breath. 3 Inhaler 1 Unknown at Unknown time  . albuterol (PROVENTIL) (2.5 MG/3ML) 0.083% nebulizer solution Take 3 mLs (2.5 mg total) by nebulization every 6 (six) hours as needed for wheezing or shortness of breath. 75 mL 3 Unknown at Unknown time  . amoxicillin-clavulanate (AUGMENTIN) 875-125 MG tablet Take 1 tablet by mouth 2 (two) times daily. 20 tablet 0   . atorvastatin (LIPITOR) 20 MG tablet Take 1 tablet (20 mg total) by mouth daily. (Patient not taking: Reported on 06/05/2016) 90 tablet 1 Not Taking at Unknown time  . fluticasone (FLONASE) 50 MCG/ACT nasal spray Place 2 sprays into both nostrils daily. (Patient not taking: Reported on 06/05/2016) 16  g 6 Not Taking at Unknown time  . gabapentin (NEURONTIN) 300 MG capsule Take 1 capsule (300 mg total) by mouth 2 (two) times daily. 180 capsule 1   . megestrol (MEGACE) 40 MG tablet Take 2 tablets (80 mg total) by mouth 3 (three) times daily. (Patient not taking: Reported on 06/05/2016) 90 tablet 1 Not Taking at Unknown time  . metFORMIN (GLUCOPHAGE) 1000 MG tablet Take 1 tablet (1,000 mg total) by mouth 2 (two) times daily with a meal. (Patient not taking: Reported on 06/05/2016) 180 tablet 1 Not Taking at Unknown time    ROS  Blood pressure (!) 151/64, pulse 71, temperature 98.2 F (36.8 C), temperature source Oral, resp. rate 20, SpO2 98 %. Physical Exam  Heart- rrr Lungs- CTAB Abd-  benign  Results for orders placed or performed during the hospital encounter of 06/11/16 (from the past 24 hour(s))  Pregnancy, urine     Status: None   Collection Time: 06/11/16  7:00 AM  Result Value Ref Range   Preg Test, Ur NEGATIVE NEGATIVE    No results found.  Assessment/Plan: DUB- unable to tolerate exam in the office. Plan for EUA, pap smear, and d&c.  She understands the risks of surgery, including, but not to infection, bleeding, DVTs, damage to bowel, bladder, ureters. She wishes to proceed.     Emily Filbert 06/11/2016, 8:29 AM

## 2016-06-12 ENCOUNTER — Other Ambulatory Visit: Payer: Self-pay

## 2016-06-12 ENCOUNTER — Telehealth: Payer: Self-pay | Admitting: Obstetrics & Gynecology

## 2016-06-12 ENCOUNTER — Encounter (HOSPITAL_COMMUNITY): Payer: Self-pay | Admitting: Obstetrics & Gynecology

## 2016-06-12 DIAGNOSIS — N8502 Endometrial intraepithelial neoplasia [EIN]: Secondary | ICD-10-CM

## 2016-06-12 DIAGNOSIS — N8500 Endometrial hyperplasia, unspecified: Secondary | ICD-10-CM

## 2016-06-12 LAB — GLUCOSE, CAPILLARY: GLUCOSE-CAPILLARY: 115 mg/dL — AB (ref 65–99)

## 2016-06-12 LAB — CYTOLOGY - PAP
Adequacy: ABSENT
DIAGNOSIS: NEGATIVE

## 2016-06-12 NOTE — Telephone Encounter (Signed)
Told her about the pathology- complex hyperplasia versus uterine cancer Will get her an appt with gyn onc

## 2016-06-13 ENCOUNTER — Encounter: Payer: Self-pay | Admitting: Gynecologic Oncology

## 2016-06-13 ENCOUNTER — Telehealth: Payer: Self-pay | Admitting: Gynecologic Oncology

## 2016-06-13 NOTE — Telephone Encounter (Signed)
Patient has been informed of appointment.

## 2016-06-13 NOTE — Telephone Encounter (Signed)
Received a call from Dr. Alease Medina office to schedule a gyn-onc appt. Appt has been scheduled for the pt to see Dr.  Alycia Rossetti on 5/16 at 12:15pm. Referring office will notify the pt. Letter mailed.

## 2016-06-13 NOTE — Telephone Encounter (Signed)
-----   Message from Hollice Gong, [place] sent at 06/12/2016  4:46 PM EDT -----  ----- Message ----- From: Emily Filbert, MD Sent: 06/12/2016   3:45 PM To: [place]  She will need an appt with gyn onc. Thanks

## 2016-06-13 NOTE — Telephone Encounter (Signed)
Patient has been scheduled for gyn oncology appt for 06/26/2016@ 12:15. She will need to go [facility] arrival time is 11:45. I have attempted to call patient at all numbers listed in the system but I have not been able to leave a message.

## 2016-06-15 ENCOUNTER — Telehealth: Payer: Self-pay | Admitting: Obstetrics and Gynecology

## 2016-06-15 NOTE — Telephone Encounter (Signed)
Phone call from Westley Foots PA at [facility] ED, where MS Kain presented with fever, WBC 4,000, Hgb 9.3, Lactic Acid normal at 0.9, and CT shows an enlarged uterus with small amount of air and fluid, with small amount of pelvic fluid, cannot rule out endometritis. No evidence of acute abdomen.  CT raises ? Of endometritis. Will treat with initial dose doxycycline and flagyl in ED and then 7 days of bid Doxycycline 100 mg, and Flagyl 500 bid, and have pt call office next week. Pt to come to [facility] for re-eval if worsening symptoms, continued fever , or increase in abdominal discomfort.

## 2016-06-17 ENCOUNTER — Telehealth: Payer: Self-pay | Admitting: Obstetrics & Gynecology

## 2016-06-17 NOTE — Telephone Encounter (Signed)
Patient called, stated she had surgery 5/1 an has had very little pain until yesterday. Wants to know if she needs to take something stronger than ibuprofen as this is not working.

## 2016-06-18 ENCOUNTER — Ambulatory Visit: Payer: Self-pay | Admitting: Obstetrics & Gynecology

## 2016-06-18 NOTE — Telephone Encounter (Signed)
DNKA

## 2016-06-18 NOTE — Telephone Encounter (Signed)
Called patient. No answer. Message left stating I am calling to check on her and let her know that she will be receiving a call to schedule and appointment here in the clinic. If any questions, she can call back.

## 2016-06-18 NOTE — Telephone Encounter (Addendum)
Patient left message, had procedure with Dr [name] on 5/1. Was in ER this past weekend with fever 103 and abdominal pain. Was told she needs to f/u in clinic if she has any bleeding. She is bleeding now. Will message front desk to schedule patient asap.

## 2016-06-19 ENCOUNTER — Ambulatory Visit (INDEPENDENT_AMBULATORY_CARE_PROVIDER_SITE_OTHER): Payer: Self-pay | Admitting: Obstetrics & Gynecology

## 2016-06-19 ENCOUNTER — Encounter: Payer: Self-pay | Admitting: Obstetrics & Gynecology

## 2016-06-19 VITALS — BP 132/61 | HR 71 | Ht 69.0 in | Wt 359.1 lb

## 2016-06-19 DIAGNOSIS — Z9889 Other specified postprocedural states: Secondary | ICD-10-CM

## 2016-06-19 NOTE — Progress Notes (Signed)
   Subjective:    Patient ID: Janet [name], female    DOB: [birth year], 46 y.o.   MRN: [id number]  HPI 46 yo WF here 8 days post op s/p d&c, hysteroscopy for DUB. Her pathology showed "at least complex atypical hyperplasia". The pathologist spoke with me on the phone and was worried about some cancer cells as well. She has been referred to gyn onc.  She was seen at the [facility] about POD #3 with a fever and pelvic pain. She was given a prescription for flagyl and doxy after a CT was suspicious for endometritis.     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 She has appt with gyn onc 06-26-16 Endometritis- doing well clinically. Continue flagyl UTI- diagnosed at an outside location. She said that they called, cancelled the doxy and called in a different abx. She plans to pick it up today.

## 2016-06-20 ENCOUNTER — Telehealth: Payer: Self-pay | Admitting: Family Medicine

## 2016-06-20 ENCOUNTER — Ambulatory Visit: Payer: Medicaid Other | Attending: Gynecologic Oncology | Admitting: Gynecologic Oncology

## 2016-06-20 VITALS — BP 148/58 | HR 78 | Temp 98.0°F | Resp 18 | Ht 69.0 in | Wt 359.0 lb

## 2016-06-20 DIAGNOSIS — N8502 Endometrial intraepithelial neoplasia [EIN]: Secondary | ICD-10-CM

## 2016-06-20 DIAGNOSIS — N8500 Endometrial hyperplasia, unspecified: Secondary | ICD-10-CM | POA: Diagnosis present

## 2016-06-20 DIAGNOSIS — E119 Type 2 diabetes mellitus without complications: Secondary | ICD-10-CM

## 2016-06-20 DIAGNOSIS — E039 Hypothyroidism, unspecified: Secondary | ICD-10-CM | POA: Insufficient documentation

## 2016-06-20 DIAGNOSIS — N8501 Benign endometrial hyperplasia: Secondary | ICD-10-CM

## 2016-06-20 DIAGNOSIS — Z9889 Other specified postprocedural states: Secondary | ICD-10-CM | POA: Insufficient documentation

## 2016-06-20 DIAGNOSIS — G4730 Sleep apnea, unspecified: Secondary | ICD-10-CM | POA: Insufficient documentation

## 2016-06-20 DIAGNOSIS — J45909 Unspecified asthma, uncomplicated: Secondary | ICD-10-CM | POA: Diagnosis not present

## 2016-06-20 DIAGNOSIS — R7303 Prediabetes: Secondary | ICD-10-CM | POA: Insufficient documentation

## 2016-06-20 DIAGNOSIS — D649 Anemia, unspecified: Secondary | ICD-10-CM | POA: Diagnosis not present

## 2016-06-20 DIAGNOSIS — Z9049 Acquired absence of other specified parts of digestive tract: Secondary | ICD-10-CM | POA: Insufficient documentation

## 2016-06-20 DIAGNOSIS — Z881 Allergy status to other antibiotic agents status: Secondary | ICD-10-CM | POA: Insufficient documentation

## 2016-06-20 DIAGNOSIS — Z6841 Body Mass Index (BMI) 40.0 and over, adult: Secondary | ICD-10-CM

## 2016-06-20 NOTE — Progress Notes (Signed)
Consult Note: Gyn-Onc  Consult was requested by Janet [name] for the evaluation of Janet [name] 46 y.o. female  CC:  Chief Complaint  Patient presents with  . Endometrial Hyperplasia, Complex    Assessment/Plan:  [name] is a 46 y.o.  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  Transvaginal ultrasound from 04/22/2016 is notable for a uterus measuring 12.8 cm in greatest dimension and an endometrial stripe of 29 mm. [name] was unable to tolerate a speculum examination and on digital evaluation cervix could not be palpated. The options presented to her were  intraoperative placement of a hormonal IUD versus an attempted robotic hysterectomy bilateral salpingo-oophorectomy and sentinel lymph node dissection,  We discussed the possibility that a minimally invasive approach may not be feasible given her BMI and weight distribution.  She understands that if the minimally invasive procedure is not feasible because of findings not limited to reasons such as a failed  tilt test, the ability to maintain intra-abdominal pressure to conduct surgery,steep Trendelenburg or intraoperative fat distribution, then an a progesterone IUD will be placed.  Furthermore the uterine size of 12.8 cm and her prior history of a cesarean section makes it very likely that that a mini laparotomy may be required to remove the specimen.  I extensively reviewed the risks of robotic hysterectomy, bilateral salpingo-oophorectomy sentinel lymph node dissection and possible lymph node dissection of infection; bleeding, damage to nearby organs including bowel, bladder, vessels, nerves, and ureters. We discussed postoperative risks including infection, PE/ DVT, and lymphedema. We reviewed possible need for conversion to open laparotomy, need for possible blood transfusion. I discussed positioning during surgery of trendelenberg and risks of minor facial  swelling and care we take in preoperative positioning.  Janet [name] is aware that her BMI increases many of the these risks. A hemoglobin A1c will be assessed.  Janet [name] has a challenging social situation as her husband is receiving supportive care for his advanced leukemia. She was given the option to place a progesterone IUD temporarily to delay an attempted operative management until care for her husband is not a primary responsibility.   The operative procedure robotic assisted laparoscopic hysterectomy bilateral salpingo-oophorectomy, sentinel lymph node dissection, possible bilateral pelvic lymph node dissection, possible minilaparotomy for delivery of the surgical specimens and possible Mirena IUD placement.  Janet [name] is aware that her surgeon will be Dr. Everitt Amber    HPI: [name] is a 46 y.o.  G2P1 with history of irregular menses.  Janet [name] presented To Janet [name] in  with a report of bleeding daily for almost a month. Janet [name] was unable to tolerate an office examination and thus she underwent hysteroscopy D&C on 06/11/2016.  Solid G was notable for complex atypical hyperplasia with a few small foci suspicious for early/microscopic adenocarcinoma. She presented to the emergency room on May 6 and was diagnosed with endometritis. She's currently receiving course of Flagyl which will be completed on 06/23/2016.  There is no family history of gynecologic GI or breast malignancies.    Review of Systems:  Constitutional  Feels well,   Cardiovascular  No chest pain, shortness of breath, or edema  Pulmonary  No cough or wheeze.  Gastro Intestinal  No nausea, vomitting, or diarrhoea. No bright red blood per rectum, no abdominal pain, no change in bowel movement, or constipation.  Genito Urinary  No frequency, urgency, dysuria, vaginal spotting Musculo Skeletal  No myalgia, arthralgia,  joint swelling or pain  Neurologic  No weakness, numbness,  change in gait,  Psychology  No depression, anxiety from diagnoses and social situation  Current Meds:  Outpatient Encounter Prescriptions as of 06/20/2016  Medication Sig  . albuterol (PROVENTIL HFA;VENTOLIN HFA) 108 (90 Base) MCG/ACT inhaler Inhale 1-2 puffs into the lungs every 6 (six) hours as needed for wheezing or shortness of breath.  Marland Kitchen albuterol (PROVENTIL) (2.5 MG/3ML) 0.083% nebulizer solution Take 3 mLs (2.5 mg total) by nebulization every 6 (six) hours as needed for wheezing or shortness of breath.  Marland Kitchen amoxicillin-clavulanate (AUGMENTIN) 875-125 MG tablet Take 1 tablet by mouth 2 (two) times daily.  . cetirizine (ZYRTEC) 10 MG tablet Take 10 mg by mouth daily.  . citalopram (CELEXA) 40 MG tablet Take 1 tablet (40 mg total) by mouth daily. (Patient taking differently: Take 40 mg by mouth at bedtime. )  . Fluticasone-Salmeterol (ADVAIR DISKUS) 250-50 MCG/DOSE AEPB Inhale 1 puff into the lungs 2 (two) times daily.  Marland Kitchen gabapentin (NEURONTIN) 300 MG capsule Take 1 capsule (300 mg total) by mouth 2 (two) times daily.  Marland Kitchen ibuprofen (ADVIL,MOTRIN) 600 MG tablet Take 1 tablet (600 mg total) by mouth every 6 (six) hours as needed.  Marland Kitchen levothyroxine (SYNTHROID, LEVOTHROID) 100 MCG tablet Take 2 tablets (200 mcg total) by mouth daily before breakfast.  . meloxicam (MOBIC) 7.5 MG tablet Take 1 tablet (7.5 mg total) by mouth daily.  . metroNIDAZOLE (FLAGYL) 500 MG tablet Take 500 mg by mouth 2 (two) times daily.  . montelukast (SINGULAIR) 10 MG tablet Take 1 tablet (10 mg total) by mouth at bedtime.   No facility-administered encounter medications on file as of 06/20/2016.     Allergy:  Allergies  Allergen Reactions  . Ciprofloxacin Shortness Of Breath    Social Hx:   Social History   Social History  . Marital status: Married    Spouse name: N/A  . Number of children: N/A  . Years of education: N/A   Occupational History  . Not on file.   Social History Main Topics  . Smoking  status: Never Smoker  . Smokeless tobacco: Never Used  . Alcohol use No  . Drug use: No  . Sexual activity: Not Currently    Birth control/ protection: None   Other Topics Concern  . Not on file   Social History Narrative  . No narrative on file   Husband was diagnosed with leukemia in January 2018 with a life expectancy of approximately 6 months. At this time he is receiving supportive care and regularly is managed with blood and platelet transfusions.  Regular home nursing visits are in place.   Lakishia Bourassa denies history of sexual or physical abuse.  Past Surgical Hx:  Past Surgical History:  Procedure Laterality Date  . CARPAL TUNNEL RELEASE    . CESAREAN SECTION    . CHOLECYSTECTOMY    . cyst removal     uterus  . DILATION AND CURETTAGE OF UTERUS     still birth  . DILATION AND CURETTAGE OF UTERUS N/A 06/11/2016   Procedure: DILATATION AND CURETTAGE;  Surgeon: Emily Filbert, MD;  Location: [facility];  Service: Gynecology;  Laterality: N/A;  . SINUS EXPLORATION    . WISDOM TOOTH EXTRACTION      Past Medical Hx:  Past Medical History:  Diagnosis Date  . Anemia   . Asthma   . Depression   . Hypothyroidism   . Pre-diabetes   . Sleep apnea   .  Thyroid disease     Past Gynecological History:  Gravida 2 para 1 menarche at 66 with irregular menses since last Pap 06/11/2016 within normal limits no history of abnormal Pap test reports history of OCP use in her 57s  Family Hx: No family history on file.  Vitals:  Blood pressure (!) 148/58, pulse 78, temperature 98 F (36.7 C), temperature source Oral, resp. rate 18, height 5\' 9"  (1.753 m), weight (!) 359 lb (162.8 kg). Body mass index is 53.02 kg/m. Wt Readings from Last 3 Encounters:  06/20/16 (!) 359 lb (162.8 kg)  06/19/16 (!) 359 lb 1.6 oz (162.9 kg)  06/06/16 (!) 358 lb 2 oz (162.4 kg)    Physical Exam: WD in NAD Neck  Supple NROM, without any enlargements.  Lymph Node Survey No cervical supraclavicular or  inguinal adenopathy Cardiovascular  Pulse normal rate, regularity and rhythm. S1 and S2 normal.  Lungs  Clear to auscultation bilaterally, without wheezes/crackles/rhonchi. Good air movement.  Skin  No rash/lesions/breakdown  Psychiatry  Alert and oriented appropriate mood affect speech and reasoning. Abdomen  Normoactive bowel sounds, abdomen soft, non-tender. large pannus, .weight predominantly in the thorax  Back No CVA tenderness Genito Urinary  Vulva/vagina: Normal external female genitalia.  No lesions. No discharge or bleeding.  Bladder/urethra:  No lesions or masses  Vagina:blood in vault  Cervix:unable to assess with speculum or digital examination  Uterus: Unable to assess  Adnexa: unable to assess Rectal  Good tone, no masses no cul de sac nodularity.  Extremities  No bilateral cyanosis, clubbing or edema.   Janie Morning, MD, PhD 06/20/2016, 12:30 PM

## 2016-06-20 NOTE — Patient Instructions (Addendum)
Preparing for your Surgery  Plan for surgery on May 29,2018  with Dr. Everitt Amber Pre-operative Testing -You will receive a phone call from presurgical testing at [facility] to arrange for a pre-operative testing appointment before your surgery.  This appointment normally occurs one to two weeks before your scheduled surgery.   -Bring your insurance card, copy of an advanced directive if applicable, medication list  -At that visit, you will be asked to sign a consent for a possible blood transfusion in case a transfusion becomes necessary during surgery.  The need for a blood transfusion is rare but having consent is a necessary part of your care.     -You should not be taking blood thinners or aspirin at least ten days prior to surgery unless instructed by your surgeon.  Day Before Surgery at [place] will be asked to take in a light diet the day before surgery.  Avoid carbonated beverages.  You will be advised to have nothing to eat or drink after midnight the evening before.     Eat a light diet the day before surgery.  Examples including soups, broths,  toast, yogurt, mashed potatoes.  Things to avoid include carbonated beverages  (fizzy beverages), raw fruits and raw vegetables, or beans.    If your bowels are filled with gas, your surgeon will have difficulty  visualizing your pelvic organs which increases your surgical risks.  Your role in recovery Your role is to become active as soon as directed by your doctor, while still giving yourself time to heal.  Rest when you feel tired. You will be asked to do the following in order to speed your recovery:  - Cough and breathe deeply. This helps toclear and expand your lungs and can prevent pneumonia. You may be given a spirometer to practice deep breathing. A staff member will show you how to use the spirometer. - Do mild physical activity. Walking or moving your legs help your circulation and body functions return to  normal. A staff member will help you when you try to walk and will provide you with simple exercises. Do not try to get up or walk alone the first time. - Actively manage your pain. Managing your pain lets you move in comfort. We will ask you to rate your pain on a scale of zero to 10. It is your responsibility to tell your doctor or nurse where and how much you hurt so your pain can be treated.  Special Considerations -If you are diabetic, you may be placed on insulin after surgery to have closer control over your blood sugars to promote healing and recovery.  This does not mean that you will be discharged on insulin.  If applicable, your oral antidiabetics will be resumed when you are tolerating a solid diet.  -Your final pathology results from surgery should be available by the Friday after surgery and the results will be relayed to you when available.   Blood Transfusion Information WHAT IS A BLOOD TRANSFUSION? A transfusion is the replacement of blood or some of its parts. Blood is made up of multiple cells which provide different functions.  Red blood cells carry oxygen and are used for blood loss replacement.  White blood cells fight against infection.  Platelets control bleeding.  Plasma helps clot blood.  Other blood products are available for specialized needs, such as hemophilia or other clotting disorders. BEFORE THE TRANSFUSION  Who gives blood for transfusions?   You may be able  to donate blood to be used at a later date on yourself (autologous donation).  Relatives can be asked to donate blood. This is generally not any safer than if you have received blood from a stranger. The same precautions are taken to ensure safety when a relative's blood is donated.  Healthy volunteers who are fully evaluated to make sure their blood is safe. This is blood bank blood. Transfusion therapy is the safest it has ever been in the practice of medicine. Before blood is taken from a donor,  a complete history is taken to make sure that person has no history of diseases nor engages in risky social behavior (examples are intravenous drug use or sexual activity with multiple partners). The donor's travel history is screened to minimize risk of transmitting infections, such as malaria. The donated blood is tested for signs of infectious diseases, such as HIV and hepatitis. The blood is then tested to be sure it is compatible with you in order to minimize the chance of a transfusion reaction. If you or a relative donates blood, this is often done in anticipation of surgery and is not appropriate for emergency situations. It takes many days to process the donated blood. RISKS AND COMPLICATIONS Although transfusion therapy is very safe and saves many lives, the main dangers of transfusion include:   Getting an infectious disease.  Developing a transfusion reaction. This is an allergic reaction to something in the blood you were given. Every precaution is taken to prevent this. The decision to have a blood transfusion has been considered carefully by your caregiver before blood is given. Blood is not given unless the benefits outweigh the risks.  Dilation and Curettage or Vacuum Curettage Dilation and curettage (D&C) and vacuum curettage are minor procedures. A D&C involves stretching (dilation) the cervix and scraping (curettage) the inside lining of the uterus (endometrium). During a D&C, tissue is gently scraped from the endometrium, starting from the top portion of the uterus down to the lowest part of the uterus (cervix). During a vacuum curettage, the lining and tissue in the uterus are removed with the use of gentle suction. Curettage may be performed to either diagnose or treat a problem. As a diagnostic procedure, curettage is performed to examine tissues from the uterus. A diagnostic curettage may be done if you have:  Irregular bleeding in the uterus.  Bleeding with the development of  clots.  Spotting between menstrual periods.  Prolonged menstrual periods or other abnormal bleeding.  Bleeding after menopause.  No menstrual period (amenorrhea).  A change in size and shape of the uterus.  Abnormal endometrial cells discovered during a Pap test. As a treatment procedure, curettage may be performed for the following reasons:  Removal of an IUD (intrauterine device).  Removal of retained placenta after giving birth.  Abortion.  Miscarriage.  Removal of endometrial polyps.  Removal of uncommon types of noncancerous lumps (fibroids). Tell a health care provider about:  Any allergies you have, including allergies to prescribed medicine or latex.  All medicines you are taking, including vitamins, herbs, eye drops, creams, and over-the-counter medicines. This is especially important if you take any blood-thinning medicine. Bring a list of all of your medicines to your appointment.  Any problems you or family members have had with anesthetic medicines.  Any blood disorders you have.  Any surgeries you have had.  Your medical history and any medical conditions you have.  Whether you are pregnant or may be pregnant.  Recent vaginal infections  you have had.  Recent menstrual periods, bleeding problems you have had, and what form of birth control (contraception) you use. What are the risks? Generally, this is a safe procedure. However, problems may occur, including:  Infection.  Heavy vaginal bleeding.  Allergic reactions to medicines.  Damage to the cervix or other structures or organs.  Development of scar tissue (adhesions) inside the uterus, which can cause abnormal amounts of menstrual bleeding. This may make it harder to get pregnant in the future.  A hole (perforation) or puncture in the uterine wall. This is rare. What happens before the procedure? Staying hydrated  Follow instructions from your health care provider about hydration, which may  include:  Up to 2 hours before the procedure - you may continue to drink clear liquids, such as water, clear fruit juice, black coffee, and plain tea. Eating and drinking restrictions  Follow instructions from your health care provider about eating and drinking, which may include:  8 hours before the procedure - stop eating heavy meals or foods such as meat, fried foods, or fatty foods.  6 hours before the procedure - stop eating light meals or foods, such as toast or cereal.  6 hours before the procedure - stop drinking milk or drinks that contain milk.  2 hours before the procedure - stop drinking clear liquids. If your health care provider told you to take your medicine(s) on the day of your procedure, take them with only a sip of water. Medicines   Ask your health care provider about:  Changing or stopping your regular medicines. This is especially important if you are taking diabetes medicines or blood thinners.  Taking medicines such as aspirin and ibuprofen. These medicines can thin your blood. Do not take these medicines before your procedure if your health care provider instructs you not to.  You may be given antibiotic medicine to help prevent infection. General instructions   For 24 hours before your procedure, do not:  [place].  Use tampons.  Use medicines, creams, or suppositories in the vagina.  Have sexual intercourse.  You may be given a pregnancy test on the day of the procedure.  Plan to have someone take you home from the hospital or clinic.  You may have a blood or urine sample taken.  If you will be going home right after the procedure, plan to have someone with you for 24 hours. What happens during the procedure?  To reduce your risk of infection:  Your health care team will wash or sanitize their hands.  Your skin will be washed with soap.  An IV tube will be inserted into one of your veins.  You will be given one of the following:  A  medicine that numbs the area in and around the cervix (local anesthetic).  A medicine to make you fall asleep (general anesthetic).  You will lie down on your back, with your feet in foot rests (stirrups).  The size and position of your uterus will be checked.  A lubricated instrument (speculum or Sims retractor) will be inserted into the back side of your vagina. The speculum will be used to hold apart the walls of your vagina so your health care provider can see your cervix.  A tool (tenaculum) will be attached to the lip of the cervix to stabilize it.  Your cervix will be softened and dilated. This may be done by:  Taking a medicine.  Having tapered dilators or thin rods (laminaria) or gradual widening  instruments (tapered dilators) inserted into your cervix.  A small, sharp, curved instrument (curette) will be used to scrape a small amount of tissue or cells from the endometrium or cervical canal. In some cases, gentle suction is applied with the curette. The curette will then be removed. The cells will be taken to a lab for testing. The procedure may vary among health care providers and hospitals. What happens after the procedure?  You may have mild cramping, backache, pain, and light bleeding or spotting. You may pass small blood clots from your vagina.  You may have to wear compression stockings. These stockings help to prevent blood clots and reduce swelling in your legs.  Your blood pressure, heart rate, breathing rate, and blood oxygen level will be monitored until the medicines you were given have worn off. Summary  Dilation and curettage (D&C) involves stretching (dilation) the cervix and scraping (curettage) the inside lining of the uterus (endometrium).  After the procedure, you may have mild cramping, backache, pain, and light bleeding or spotting. You may pass small blood clots from your vagina.  Plan to have someone take you home from the hospital or clinic. This  information is not intended to replace advice given to you by your health care provider. Make sure you discuss any questions you have with your health care provider. Document Released: 01/28/2005 Document Revised: 10/15/2015 Document Reviewed: 10/15/2015 Elsevier Interactive Patient Education  2017 [employer]. Levonorgestrel intrauterine device (IUD) What is this medicine? LEVONORGESTREL IUD (LEE voe nor jes trel) is a contraceptive (birth control) device. The device is placed inside the uterus by a healthcare professional. It is used to prevent pregnancy. This device can also be used to treat heavy bleeding that occurs during your period. This medicine may be used for other purposes; ask your health care provider or pharmacist if you have questions. COMMON BRAND NAME(S): Minette Headland What should I tell my health care provider before I take this medicine? They need to know if you have any of these conditions: -abnormal Pap smear -cancer of the breast, uterus, or cervix -diabetes -endometritis -genital or pelvic infection now or in the past -have more than one sexual partner or your partner has more than one partner -heart disease -history of an ectopic or tubal pregnancy -immune system problems -IUD in place -liver disease or tumor -problems with blood clots or take blood-thinners -seizures -use intravenous drugs -uterus of unusual shape -vaginal bleeding that has not been explained -an unusual or allergic reaction to levonorgestrel, other hormones, silicone, or polyethylene, medicines, foods, dyes, or preservatives -pregnant or trying to get pregnant -breast-feeding How should I use this medicine? This device is placed inside the uterus by a health care professional. Talk to your pediatrician regarding the use of this medicine in children. Special care may be needed. Overdosage: If you think you have taken too much of this medicine contact a poison control  center or emergency room at once. NOTE: This medicine is only for you. Do not share this medicine with others. What if I miss a dose? This does not apply. Depending on the brand of device you have inserted, the device will need to be replaced every 3 to 5 years if you wish to continue using this type of birth control. What may interact with this medicine? Do not take this medicine with any of the following medications: -amprenavir -bosentan -fosamprenavir This medicine may also interact with the following medications: -aprepitant -armodafinil -barbiturate medicines for inducing sleep or  treating seizures -bexarotene -boceprevir -griseofulvin -medicines to treat seizures like carbamazepine, ethotoin, felbamate, oxcarbazepine, phenytoin, topiramate -modafinil -pioglitazone -rifabutin -rifampin -rifapentine -some medicines to treat HIV infection like atazanavir, efavirenz, indinavir, lopinavir, nelfinavir, tipranavir, ritonavir -St. John's wort -warfarin This list may not describe all possible interactions. Give your health care provider a list of all the medicines, herbs, non-prescription drugs, or dietary supplements you use. Also tell them if you smoke, drink alcohol, or use illegal drugs. Some items may interact with your medicine. What should I watch for while using this medicine? Visit your doctor or health care professional for regular check ups. See your doctor if you or your partner has sexual contact with others, becomes HIV positive, or gets a sexual transmitted disease. This product does not protect you against HIV infection (AIDS) or other sexually transmitted diseases. You can check the placement of the IUD yourself by reaching up to the top of your vagina with clean fingers to feel the threads. Do not pull on the threads. It is a good habit to check placement after each menstrual period. Call your doctor right away if you feel more of the IUD than just the threads or if you  cannot feel the threads at all. The IUD may come out by itself. You may become pregnant if the device comes out. If you notice that the IUD has come out use a backup birth control method like condoms and call your health care provider. Using tampons will not change the position of the IUD and are okay to use during your period. This IUD can be safely scanned with magnetic resonance imaging (MRI) only under specific conditions. Before you have an MRI, tell your healthcare provider that you have an IUD in place, and which type of IUD you have in place. What side effects may I notice from receiving this medicine? Side effects that you should report to your doctor or health care professional as soon as possible: -allergic reactions like skin rash, itching or hives, swelling of the face, lips, or tongue -fever, flu-like symptoms -genital sores -high blood pressure -no menstrual period for 6 weeks during use -pain, swelling, warmth in the leg -pelvic pain or tenderness -severe or sudden headache -signs of pregnancy -stomach cramping -sudden shortness of breath -trouble with balance, talking, or walking -unusual vaginal bleeding, discharge -yellowing of the eyes or skin Side effects that usually do not require medical attention (report to your doctor or health care professional if they continue or are bothersome): -acne -breast pain -change in sex drive or performance -changes in weight -cramping, dizziness, or faintness while the device is being inserted -headache -irregular menstrual bleeding within first 3 to 6 months of use -nausea This list may not describe all possible side effects. Call your doctor for medical advice about side effects. You may report side effects to FDA at 1-800-FDA-1088. Where should I keep my medicine? This does not apply. NOTE: This sheet is a summary. It may not cover all possible information. If you have questions about this medicine, talk to your doctor,  pharmacist, or health care provider.  2018 Elsevier/Gold Standard (2015-11-10 14:14:56) Total Laparoscopic Hysterectomy A total laparoscopic hysterectomy is a minimally invasive surgery to remove your uterus and cervix. This surgery is performed by making several small cuts (incisions) in your abdomen. It can also be done with a thin, lighted tube (laparoscope) inserted into two small incisions in your lower abdomen. Your fallopian tubes and ovaries can be removed (bilateral salpingo-oophorectomy) during this surgery  as well.Benefits of minimally invasive surgery include: Less pain. Less risk of blood loss. Less risk of infection. Quicker return to normal activities. Tell a health care provider about: Any allergies you have. All medicines you are taking, including vitamins, herbs, eye drops, creams, and over-the-counter medicines. Any problems you or family members have had with anesthetic medicines. Any blood disorders you have. Any surgeries you have had. Any medical conditions you have. What are the risks? Generally, this is a safe procedure. However, as with any procedure, complications can occur. Possible complications include: Bleeding. Blood clots in the legs or lung. Infection. Injury to surrounding organs. Problems with anesthesia. Early menopause symptoms (hot flashes, night sweats, insomnia). Risk of conversion to an open abdominal incision. What happens before the procedure? Ask your health care provider about changing or stopping your regular medicines. Do not take aspirin or blood thinners (anticoagulants) for 1 week before the surgery or as told by your health care provider. Do not eat or drink anything for 8 hours before the surgery or as told by your health care provider. Quit smoking if you smoke. Arrange for a ride home after surgery and for someone to help you at home during recovery. What happens during the procedure? You will be given antibiotic medicine. An IV  tube will be placed in your arm. You will be given medicine to make you sleep (general anesthetic). A gas (carbon dioxide) will be used to inflate your abdomen. This will allow your surgeon to look inside your abdomen, perform your surgery, and treat any other problems found if necessary. Three or four small incisions (often less than 1/2 inch) will be made in your abdomen. One of these incisions will be made in the area of your belly button (navel). The laparoscope will be inserted into the incision. Your surgeon will look through the laparoscope while doing your procedure. Other surgical instruments will be inserted through the other incisions. Your uterus may be removed through your vagina or cut into small pieces and removed through the small incisions. Your incisions will be closed. What happens after the procedure? The gas will be released from inside your abdomen. You will be taken to the recovery area where a nurse will watch and check your progress. Once you are awake, stable, and taking fluids well, without other problems, you will return to your room or be allowed to go home. There is usually minimal discomfort following the surgery because the incisions are so small. You will be given pain medicine while you are in the hospital and for when you go home. This information is not intended to replace advice given to you by your health care provider. Make sure you discuss any questions you have with your health care provider. Document Released: 11/25/2006 Document Revised: 07/06/2015 Document Reviewed: 08/18/2012 Elsevier Interactive Patient Education  2017 Elsevier Inc. Bilateral Salpingo-Oophorectomy Bilateral salpingo-oophorectomy is the surgical removal of both fallopian tubes and both ovaries. The ovaries are small organs that produce eggs in women. The fallopian tubes transport the egg from the ovary to the womb (uterus). Usually, when this surgery is done, the uterus was previously  removed. A bilateral salpingo-oophorectomy may be done to treat cancer or to reduce the risk of cancer in women who are at high risk. Removing both fallopian tubes and both ovaries will make you unable to become pregnant (sterile). It will also put you into menopause so that you will no longer have menstrual periods and may have menopausal symptoms such as hot  flashes, night sweats, and mood changes. It will not affect your sex drive. Tell a health care provider about:  Any allergies you have.  All medicines you are taking, including vitamins, herbs, eye drops, creams, and over-the-counter medicines.  Previous problems you or family members have had with anesthetic medicines.  Any blood disorders you have.  Previous surgeries you have had.  Any medical conditions you have. What are the risks? Generally, this is a safe procedure. However, as with any procedure, complications can occur. Possible complications include:  Injury to surrounding organs.  Bleeding.  Infection.  Blood clots in the legs or lungs.  Problems related to anesthesia. What happens before the procedure?  Ask your health care provider about changing or stopping your regular medicines. You may need to stop taking certain medicines, such as aspirin or blood thinners, at least 1 week before the surgery.  Do not eat or drink anything for at least 8 hours before the surgery.  If you smoke, do not smoke for at least 2 weeks before the surgery.  Make plans to have someone drive you home after the procedure or after your hospital stay. Also arrange for someone to help you with activities during recovery. What happens during the procedure?  You will be given medicine to help you relax before the procedure (sedative). You will then be given medicine to make you sleep through the procedure (general anesthetic). These medicines will be given through an IV access tube that is put into one of your veins.  Once you are  asleep, your lower abdomen will be shaved and cleaned. A thin, flexible tube (catheter) will be placed in your bladder.  The surgeon may use a laparoscopic, robotic, or open technique for this surgery:  In the laparoscopic technique, the surgery is done through two small cuts (incisions) in the abdomen. A thin, lighted tube with a tiny camera on the end (laparoscope) is inserted into one of the incisions. The tools needed for the procedure are put through the other incision.  A robotic technique may be chosen to perform complex surgery in a small space. In the robotic technique, small incisions will be made. A camera and surgical instruments are passed through the incisions. Surgical instruments will be controlled with the help of a robotic arm.  In the open technique, the surgery is done through one large incision in the abdomen.  Using any of these techniques, the surgeon removes the fallopian tubes and ovaries. The blood vessels will be clamped and tied.  The surgeon then uses staples or stitches to close the incision or incisions. What happens after the procedure?  You will be taken to a recovery area where you will be monitored for 1 to 3 hours. Your blood pressure, pulse, and temperature will be checked often. You will remain in the recovery area until you are stable and waking up.  If the laparoscopic technique was used, you may be allowed to go home after several hours. You may have some shoulder pain after the laparoscopic procedure. This is normal and usually goes away in a day or two.  If the open technique was used, you will be admitted to the hospital for a couple of days.  You will be given pain medicine as needed.  The IV access tube and catheter will be removed before you are discharged. This information is not intended to replace advice given to you by your health care provider. Make sure you discuss any questions you have with  your health care provider. Document Released:  01/28/2005 Document Revised: 12/29/2015 Document Reviewed: 07/22/2012 Elsevier Interactive Patient Education  2017 [place].   Sentinel Lymph Node Biopsy Sentinel lymph node biopsy is a procedure to identify, remove, and examine one or more lymph nodes for cancer. Lymph nodes are collections of tissue that help filter infections, cancer cells, and other waste substances from the bloodstream. Certain types of cancer can spread to nearby lymph nodes. The cancer usually spreads to one lymph node first, and then to others. The first lymph node that the cancer could spread to is called the sentinel lymph node. In some cases, there may be more than one sentinel lymph node. You may have this procedure to determine whether your cancer has spread and to help your health care provider plan treatment for you. If no cancer is found in the sentinel lymph node, it is very unlikely that the cancer has spread to any of the other lymph nodes. If cancer is found in the sentinel lymph node, your surgeon may remove additional lymph nodes for examination. Tell a health care provider about:  Any allergies you have, including any history of problems with contrast dye.  All medicines you are taking, including vitamins, herbs, eye drops, creams, and over-the-counter medicines.  Any problems you or family members have had with anesthetic medicines.  Any blood disorders you have.  Any surgeries you have had.  Any medical conditions you have. What are the risks? Generally, sentinel lymph node biopsy is a safe procedure. However, problems can occur and include:  Infection.  Bleeding.  Allergic reaction to the dye used for the procedure.  Staining of the skin where the dye is injected.  Damaged lymph vessels, causing a buildup of fluid (lymphedema).  Pain or bruising at the biopsy site. What happens before the procedure?  If you smoke, stop smoking at least 2 weeks before the procedure. This will improve  your health after the procedure and reduce your risk of getting an infection.  You may have blood tests to make sure your blood clots normally.  Ask your health care provider about:  Changing or stopping your regular medicines. This is especially important if you are taking diabetes medicines or blood thinners.  Taking medicines such as aspirin and ibuprofen. These medicines can thin your blood. Do not take these medicines before your procedure if your health care provider asks you not to.  Do not eat or drink anything after midnight on the night before the procedure or as directed by your health care provider.  Plan to have someone take you home after the procedure. What happens during the procedure?  You will be given a medicine that makes you fall asleep (general anesthetic) or medicine that numbs the surgical area (local anesthetic).  Blue dye or a radioactive substance or both will be injected around the tumor.  The blue dye will reach your lymph node quickly. It may be given just before surgery.  The radioactive substance will take longer to reach your lymph nodes, so it may be given before you go into the operating area.  Both the dye and the radioactive substance will follow the same path that a spreading cancer would be likely to follow.  If a radioactive substance is injected, a scanner will show where the substance has spread to help identify the sentinel lymph node.  The surgeon will make a small incision. If blue dye is injected, your surgeon will look for any lymph nodes that  have picked up the dye.  Sentinel lymph nodes will be removed and sent to a lab for examination.  If no cancer is found, no other lymph nodes will be removed. This means it is unlikely the cancer has spread to other lymph nodes.  If cancer is found, the surgeon will remove other lymph nodes for examination. This may happen during the same procedure or at a later time.  The incision will be closed  with stitches or metal clips.  Small adhesive bandages may be used to keep the skin edges close together.  A small dressing may be taped over the incision area. What happens after the procedure?  You will go to a recovery room.  Your urine may be blue for the next 24 hours. This is normal. It is caused by the dye used during the procedure.  Your skin at the injection site may be blue for up to 8 weeks.  You may feel numbness, tingling, or pain near your incision.  You may have swelling or bruising near your incision. This information is not intended to replace advice given to you by your health care provider. Make sure you discuss any questions you have with your health care provider. Document Released: 04/22/2011 Document Revised: 10/12/2015 Document Reviewed: 03/19/2013 Elsevier Interactive Patient Education  2017 [place] a light diet the day before surgery.  Examples including soups, broths, toast, yogurt, mashed potatoes.  Things to avoid include carbonated beverages (fizzy beverages), raw fruits and raw vegetables, or beans.   If your bowels are filled with gas, your surgeon will have difficulty visualizing your pelvic organs which increases your surgical risks.

## 2016-06-20 NOTE — Telephone Encounter (Signed)
Is this refill request appropriate?

## 2016-06-20 NOTE — Telephone Encounter (Signed)
Okay to refill. But we don't have the name of the pharmacy in [place].

## 2016-06-20 NOTE — Telephone Encounter (Signed)
Patient needs to specify the address of the pharmacy for Dr. Jarold Song to send the script.

## 2016-06-20 NOTE — Telephone Encounter (Signed)
PT called to request a refill for cetirizine (ZYRTEC) 10 MG tablet Can you please sent it to pharmacy in [place] they number is [phone]

## 2016-06-26 ENCOUNTER — Ambulatory Visit: Payer: Self-pay | Admitting: Gynecologic Oncology

## 2016-06-26 ENCOUNTER — Ambulatory Visit: Payer: Self-pay | Admitting: Family Medicine

## 2016-06-27 NOTE — Patient Instructions (Signed)
Janet [name]  06/27/2016   Your procedure is scheduled on: 07-09-16  Report to [place] to 3rd floor to [place] at 5:30AM.   Call this number if you have problems the morning of surgery [phone]    Remember: ONLY 1 PERSON MAY GO WITH YOU TO SHORT STAY TO GET  READY MORNING OF [place].  Do not eat food or drink liquids :After Midnight.   Eat a light diet the day before surgery.  Examples including soups, broths, toast, yogurt, mashed potatoes.  Things to avoid include carbonated beverages (fizzy  beverages), raw fruits and raw vegetables, or beans.    If your bowels are filled with gas, your surgeon will have difficulty visualizing your pelvic organs which increases your surgical risks.   Take these medicines the morning of surgery with A SIP OF WATER: Levothyroxine (Synthroid), and Cetirizine (Zyrtec). You may also bring and use any  inhaler that you might need.                                You may not have any metal on your body including hair pins and              piercings  Do not wear jewelry, make-up, lotions, powders or perfumes, deodorant             Men may shave face and neck.   Do not bring valuables to the hospital. [place].  Contacts, dentures or bridgework may not be worn into surgery.  Leave suitcase in the car. After surgery it may be brought to your room.                  Please read over the following fact sheets you were given: _____________________________________________________________________  [facility] - Preparing for Surgery Before surgery, you can play an important role.  Because skin is not sterile, your skin needs to be as free of germs as possible.  You can reduce the number of germs on your skin by washing with CHG (chlorahexidine gluconate) soap before surgery.  CHG is an antiseptic cleaner which kills germs and  bonds with the skin to continue killing germs even after washing. Please DO NOT use if you have an allergy to CHG or antibacterial soaps.  If your skin becomes reddened/irritated stop using the CHG and inform your nurse when you arrive at Short Stay. Do not shave (including legs and underarms) for at least 48 hours prior to the first CHG shower.  You may shave your face/neck. Please follow these instructions carefully:  1.  Shower with CHG Soap the night before surgery and the  morning of Surgery.  2.  If you choose to wash your hair, wash your hair first as usual with your  normal  shampoo.  3.  After you shampoo, rinse your hair and body thoroughly to remove the  shampoo.                           4.  Use CHG as you would any other liquid soap.  You can apply chg directly  to the skin and wash  Gently with a scrungie or clean washcloth.  5.  Apply the CHG Soap to your body ONLY FROM THE NECK DOWN.   Do not use on face/ open                           Wound or open sores. Avoid contact with eyes, ears mouth and genitals (private parts).                       Wash face,  Genitals (private parts) with your normal soap.             6.  Wash thoroughly, paying special attention to the area where your surgery  will be performed.  7.  Thoroughly rinse your body with warm water from the neck down.  8.  DO NOT shower/wash with your normal soap after using and rinsing off  the CHG Soap.                9.  Pat yourself dry with a clean towel.            10.  Wear clean pajamas.            11.  Place clean sheets on your bed the night of your first shower and do not  sleep with pets. Day of Surgery : Do not apply any lotions/deodorants the morning of surgery.  Please wear clean clothes to the hospital/surgery center.  FAILURE TO FOLLOW THESE INSTRUCTIONS MAY RESULT IN THE CANCELLATION OF YOUR SURGERY PATIENT SIGNATURE_________________________________  NURSE  SIGNATURE__________________________________  ________________________________________________________________________

## 2016-06-28 ENCOUNTER — Inpatient Hospital Stay (HOSPITAL_COMMUNITY): Admission: RE | Admit: 2016-06-28 | Payer: Self-pay | Source: Ambulatory Visit

## 2016-07-01 ENCOUNTER — Telehealth: Payer: Self-pay

## 2016-07-01 NOTE — Telephone Encounter (Signed)
Reviewed with Janet [name] that if she does not medically tolerate being in trendelenburg for hysterectomy that she will have a D&C instead and will take megace tablets for the treatment of the cancer as the IUD is ~$1000.00. If medicaid is approved close to the [facility], she may be able to have the IUD placed in the office. Pt's husband passed away last week from his leukemia. Pt will bring FMLA papers tomorrow for her surgery.

## 2016-07-02 ENCOUNTER — Encounter (HOSPITAL_COMMUNITY)
Admission: RE | Admit: 2016-07-02 | Discharge: 2016-07-02 | Disposition: A | Discharge status: Home / Self Care | Payer: Medicaid Other | Source: Ambulatory Visit | Attending: Gynecologic Oncology | Admitting: Gynecologic Oncology

## 2016-07-02 ENCOUNTER — Encounter (HOSPITAL_COMMUNITY): Payer: Self-pay

## 2016-07-02 ENCOUNTER — Other Ambulatory Visit: Payer: Self-pay | Admitting: Family Medicine

## 2016-07-02 ENCOUNTER — Ambulatory Visit (HOSPITAL_COMMUNITY)
Admission: RE | Admit: 2016-07-02 | Discharge: 2016-07-02 | Disposition: A | Discharge status: Home / Self Care | Payer: Medicaid Other | Source: Ambulatory Visit | Attending: Gynecologic Oncology | Admitting: Gynecologic Oncology

## 2016-07-02 DIAGNOSIS — Z01818 Encounter for other preprocedural examination: Secondary | ICD-10-CM | POA: Insufficient documentation

## 2016-07-02 DIAGNOSIS — C541 Malignant neoplasm of endometrium: Secondary | ICD-10-CM | POA: Diagnosis not present

## 2016-07-02 DIAGNOSIS — E039 Hypothyroidism, unspecified: Secondary | ICD-10-CM

## 2016-07-02 LAB — URINALYSIS, ROUTINE W REFLEX MICROSCOPIC
Bilirubin Urine: NEGATIVE
GLUCOSE, UA: NEGATIVE mg/dL
Ketones, ur: NEGATIVE mg/dL
Nitrite: NEGATIVE
PH: 5 (ref 5.0–8.0)
PROTEIN: 30 mg/dL — AB
Specific Gravity, Urine: 1.02 (ref 1.005–1.030)

## 2016-07-02 LAB — CBC WITH DIFFERENTIAL/PLATELET
BASOS PCT: 1 %
Basophils Absolute: 0 10*3/uL (ref 0.0–0.1)
EOS PCT: 6 %
Eosinophils Absolute: 0.2 10*3/uL (ref 0.0–0.7)
HCT: 28.1 % — ABNORMAL LOW (ref 36.0–46.0)
Hemoglobin: 8.4 g/dL — ABNORMAL LOW (ref 12.0–15.0)
LYMPHS ABS: 1 10*3/uL (ref 0.7–4.0)
Lymphocytes Relative: 25 %
MCH: 21.4 pg — AB (ref 26.0–34.0)
MCHC: 29.9 g/dL — AB (ref 30.0–36.0)
MCV: 71.7 fL — ABNORMAL LOW (ref 78.0–100.0)
MONO ABS: 0.2 10*3/uL (ref 0.1–1.0)
Monocytes Relative: 5 %
NEUTROS ABS: 2.7 10*3/uL (ref 1.7–7.7)
Neutrophils Relative %: 63 %
PLATELETS: 142 10*3/uL — AB (ref 150–400)
RBC: 3.92 MIL/uL (ref 3.87–5.11)
RDW: 15.6 % — AB (ref 11.5–15.5)
WBC: 4.1 10*3/uL (ref 4.0–10.5)

## 2016-07-02 LAB — ABO/RH: ABO/RH(D): O POS

## 2016-07-02 LAB — COMPREHENSIVE METABOLIC PANEL
ALT: 16 U/L (ref 14–54)
ANION GAP: 8 (ref 5–15)
AST: 24 U/L (ref 15–41)
Albumin: 4.1 g/dL (ref 3.5–5.0)
Alkaline Phosphatase: 66 U/L (ref 38–126)
BUN: 9 mg/dL (ref 6–20)
CHLORIDE: 107 mmol/L (ref 101–111)
CO2: 24 mmol/L (ref 22–32)
Calcium: 8.6 mg/dL — ABNORMAL LOW (ref 8.9–10.3)
Creatinine, Ser: 0.84 mg/dL (ref 0.44–1.00)
GFR calc non Af Amer: >60 mL/min (ref >60)
Glucose, Bld: 144 mg/dL — ABNORMAL HIGH (ref 65–99)
POTASSIUM: 4 mmol/L (ref 3.5–5.1)
SODIUM: 139 mmol/L (ref 135–145)
Total Bilirubin: 0.6 mg/dL (ref 0.3–1.2)
Total Protein: 6.9 g/dL (ref 6.5–8.1)

## 2016-07-02 LAB — PREGNANCY, URINE: PREG TEST UR: NEGATIVE

## 2016-07-02 MED FILL — MONTELUKAST SOD 10 MG TAB: 10 | 30 days supply | Qty: 30 | Fill #0

## 2016-07-02 MED FILL — CITALOPRAM HBR 40 MG TABLET: 40 | 90 days supply | Qty: 90 | Fill #0

## 2016-07-02 MED FILL — ?LEVOTHYROXINE 100 MCG TAB: 100 | 30 days supply | Qty: 60 | Fill #2

## 2016-07-02 NOTE — Progress Notes (Signed)
07-02-16 CBC w/Diff and UA results, routed to Dr. Denman George for review.

## 2016-07-03 LAB — HEMOGLOBIN A1C
Hgb A1c MFr Bld: 5.9 % — ABNORMAL HIGH (ref 4.8–5.6)
Mean Plasma Glucose: 123 mg/dL

## 2016-07-08 MED ORDER — DEXTROSE 5 % IV SOLN
3.0000 g | INTRAVENOUS | Status: AC
  Administered 2016-07-09: 3 g via INTRAVENOUS
  Filled 2016-07-08: qty 3000

## 2016-07-09 ENCOUNTER — Encounter (HOSPITAL_COMMUNITY)
Admission: RE | Disposition: A | Discharge status: Home / Self Care | Payer: Self-pay | Source: Ambulatory Visit | Attending: Gynecologic Oncology

## 2016-07-09 ENCOUNTER — Ambulatory Visit (HOSPITAL_COMMUNITY): Payer: Medicaid Other | Admitting: Anesthesiology

## 2016-07-09 ENCOUNTER — Telehealth: Payer: Self-pay | Admitting: *Deleted

## 2016-07-09 ENCOUNTER — Encounter (HOSPITAL_COMMUNITY): Payer: Self-pay | Admitting: *Deleted

## 2016-07-09 ENCOUNTER — Ambulatory Visit (HOSPITAL_COMMUNITY)
Admission: RE | Admit: 2016-07-09 | Discharge: 2016-07-10 | Disposition: A | Discharge status: Home / Self Care | Payer: Medicaid Other | Source: Ambulatory Visit | Attending: Gynecologic Oncology | Admitting: Gynecologic Oncology

## 2016-07-09 DIAGNOSIS — Z6841 Body Mass Index (BMI) 40.0 and over, adult: Secondary | ICD-10-CM | POA: Diagnosis not present

## 2016-07-09 DIAGNOSIS — D649 Anemia, unspecified: Secondary | ICD-10-CM | POA: Diagnosis present

## 2016-07-09 DIAGNOSIS — E039 Hypothyroidism, unspecified: Secondary | ICD-10-CM | POA: Diagnosis not present

## 2016-07-09 DIAGNOSIS — G4730 Sleep apnea, unspecified: Secondary | ICD-10-CM | POA: Diagnosis not present

## 2016-07-09 DIAGNOSIS — D696 Thrombocytopenia, unspecified: Secondary | ICD-10-CM | POA: Diagnosis not present

## 2016-07-09 DIAGNOSIS — E785 Hyperlipidemia, unspecified: Secondary | ICD-10-CM | POA: Insufficient documentation

## 2016-07-09 DIAGNOSIS — E669 Obesity, unspecified: Secondary | ICD-10-CM | POA: Diagnosis not present

## 2016-07-09 DIAGNOSIS — J45909 Unspecified asthma, uncomplicated: Secondary | ICD-10-CM | POA: Insufficient documentation

## 2016-07-09 DIAGNOSIS — N8502 Endometrial intraepithelial neoplasia [EIN]: Secondary | ICD-10-CM | POA: Diagnosis present

## 2016-07-09 DIAGNOSIS — Z79899 Other long term (current) drug therapy: Secondary | ICD-10-CM | POA: Diagnosis not present

## 2016-07-09 DIAGNOSIS — C541 Malignant neoplasm of endometrium: Secondary | ICD-10-CM

## 2016-07-09 DIAGNOSIS — Z7984 Long term (current) use of oral hypoglycemic drugs: Secondary | ICD-10-CM | POA: Diagnosis not present

## 2016-07-09 DIAGNOSIS — M1990 Unspecified osteoarthritis, unspecified site: Secondary | ICD-10-CM | POA: Insufficient documentation

## 2016-07-09 DIAGNOSIS — N938 Other specified abnormal uterine and vaginal bleeding: Secondary | ICD-10-CM | POA: Diagnosis present

## 2016-07-09 DIAGNOSIS — F329 Major depressive disorder, single episode, unspecified: Secondary | ICD-10-CM | POA: Diagnosis not present

## 2016-07-09 HISTORY — PX: ROBOTIC ASSISTED TOTAL HYSTERECTOMY WITH BILATERAL SALPINGO OOPHERECTOMY: SHX6086

## 2016-07-09 LAB — TYPE AND SCREEN
ABO/RH(D): O POS
ANTIBODY SCREEN: NEGATIVE

## 2016-07-09 SURGERY — HYSTERECTOMY, TOTAL, ROBOT-ASSISTED, LAPAROSCOPIC, WITH BILATERAL SALPINGO-OOPHORECTOMY
Anesthesia: General

## 2016-07-09 MED ORDER — STERILE WATER FOR IRRIGATION IR SOLN
Status: DC | PRN
  Administered 2016-07-09: 1000 mL

## 2016-07-09 MED ORDER — ONDANSETRON HCL 4 MG/2ML IJ SOLN
INTRAMUSCULAR | Status: AC
  Filled 2016-07-09: qty 2

## 2016-07-09 MED ORDER — LACTATED RINGERS IR SOLN
Status: DC | PRN
  Administered 2016-07-09: 1000 mL

## 2016-07-09 MED ORDER — CITALOPRAM HYDROBROMIDE 20 MG PO TABS
40.0000 mg | ORAL_TABLET | Freq: Every day | ORAL | Status: DC
  Administered 2016-07-10: 40 mg via ORAL
  Filled 2016-07-09: qty 2

## 2016-07-09 MED ORDER — ENOXAPARIN SODIUM 40 MG/0.4ML ~~LOC~~ SOLN
40.0000 mg | SUBCUTANEOUS | Status: AC
  Administered 2016-07-09: 40 mg via SUBCUTANEOUS
  Filled 2016-07-09: qty 0.4

## 2016-07-09 MED ORDER — EPHEDRINE 5 MG/ML INJ
INTRAVENOUS | Status: AC
  Filled 2016-07-09: qty 10

## 2016-07-09 MED ORDER — METOCLOPRAMIDE HCL 5 MG/ML IJ SOLN: 10.0000 mg | Freq: Once | INTRAMUSCULAR | Status: DC | PRN

## 2016-07-09 MED ORDER — MOMETASONE FURO-FORMOTEROL FUM 200-5 MCG/ACT IN AERO
2.0000 | INHALATION_SPRAY | Freq: Two times a day (BID) | RESPIRATORY_TRACT | Status: DC
  Administered 2016-07-09 – 2016-07-10 (×2): 2 via RESPIRATORY_TRACT
  Filled 2016-07-09: qty 8.8

## 2016-07-09 MED ORDER — ONDANSETRON HCL 4 MG PO TABS: 4.0000 mg | ORAL_TABLET | Freq: Four times a day (QID) | ORAL | Status: DC | PRN

## 2016-07-09 MED ORDER — METOCLOPRAMIDE HCL 5 MG/ML IJ SOLN
INTRAMUSCULAR | Status: DC | PRN
  Administered 2016-07-09: 5 mg via INTRAVENOUS

## 2016-07-09 MED ORDER — BUPIVACAINE HCL (PF) 0.25 % IJ SOLN
INTRAMUSCULAR | Status: AC
  Filled 2016-07-09: qty 30

## 2016-07-09 MED ORDER — MIDAZOLAM HCL 2 MG/2ML IJ SOLN
INTRAMUSCULAR | Status: AC
  Filled 2016-07-09: qty 2

## 2016-07-09 MED ORDER — BUPIVACAINE HCL (PF) 0.25 % IJ SOLN
INTRAMUSCULAR | Status: DC | PRN
  Administered 2016-07-09: 20 mL

## 2016-07-09 MED ORDER — SCOPOLAMINE 1 MG/3DAYS TD PT72
1.0000 | MEDICATED_PATCH | TRANSDERMAL | Status: DC
  Administered 2016-07-09: 1.5 mg via TRANSDERMAL

## 2016-07-09 MED ORDER — LACTATED RINGERS IV SOLN
INTRAVENOUS | Status: DC | PRN
  Administered 2016-07-09 (×2): via INTRAVENOUS

## 2016-07-09 MED ORDER — KCL IN DEXTROSE-NACL 20-5-0.45 MEQ/L-%-% IV SOLN
INTRAVENOUS | Status: DC
  Administered 2016-07-09: 13:00:00 via INTRAVENOUS
  Filled 2016-07-09 (×3): qty 1000

## 2016-07-09 MED ORDER — HYDROMORPHONE HCL 1 MG/ML IJ SOLN
0.2500 mg | INTRAMUSCULAR | Status: DC | PRN
  Administered 2016-07-09 (×3): 0.5 mg via INTRAVENOUS

## 2016-07-09 MED ORDER — MIDAZOLAM HCL 5 MG/5ML IJ SOLN
INTRAMUSCULAR | Status: DC | PRN
  Administered 2016-07-09 (×2): 0.5 mg via INTRAVENOUS
  Administered 2016-07-09: 1 mg via INTRAVENOUS

## 2016-07-09 MED ORDER — HYDROMORPHONE HCL 1 MG/ML IJ SOLN
INTRAMUSCULAR | Status: AC
  Filled 2016-07-09: qty 0.5

## 2016-07-09 MED ORDER — ROCURONIUM BROMIDE 100 MG/10ML IV SOLN
INTRAVENOUS | Status: DC | PRN
  Administered 2016-07-09: 20 mg via INTRAVENOUS
  Administered 2016-07-09: 10 mg via INTRAVENOUS
  Administered 2016-07-09 (×2): 20 mg via INTRAVENOUS
  Administered 2016-07-09: 5 mg via INTRAVENOUS
  Administered 2016-07-09: 10 mg via INTRAVENOUS

## 2016-07-09 MED ORDER — FENTANYL CITRATE (PF) 250 MCG/5ML IJ SOLN
INTRAMUSCULAR | Status: AC
  Filled 2016-07-09: qty 5

## 2016-07-09 MED ORDER — ROCURONIUM BROMIDE 50 MG/5ML IV SOSY
PREFILLED_SYRINGE | INTRAVENOUS | Status: AC
  Filled 2016-07-09: qty 5

## 2016-07-09 MED ORDER — ACETAMINOPHEN 10 MG/ML IV SOLN
INTRAVENOUS | Status: AC
Start: 2016-07-09 — End: 2016-07-09
  Filled 2016-07-09: qty 100

## 2016-07-09 MED ORDER — DEXAMETHASONE SODIUM PHOSPHATE 10 MG/ML IJ SOLN
INTRAMUSCULAR | Status: AC
  Filled 2016-07-09: qty 1

## 2016-07-09 MED ORDER — DEXAMETHASONE SODIUM PHOSPHATE 10 MG/ML IJ SOLN
INTRAMUSCULAR | Status: DC | PRN
  Administered 2016-07-09: 10 mg via INTRAVENOUS

## 2016-07-09 MED ORDER — OXYCODONE-ACETAMINOPHEN 5-325 MG PO TABS
1.0000 | ORAL_TABLET | ORAL | Status: DC | PRN
  Administered 2016-07-09: 1 via ORAL
  Administered 2016-07-10: 2 via ORAL
  Filled 2016-07-09: qty 1
  Filled 2016-07-09: qty 2

## 2016-07-09 MED ORDER — ONDANSETRON HCL 4 MG/2ML IJ SOLN
INTRAMUSCULAR | Status: DC | PRN
  Administered 2016-07-09 (×2): 2 mg via INTRAVENOUS

## 2016-07-09 MED ORDER — ALBUTEROL SULFATE (2.5 MG/3ML) 0.083% IN NEBU: 2.5000 mg | INHALATION_SOLUTION | Freq: Four times a day (QID) | RESPIRATORY_TRACT | Status: DC | PRN

## 2016-07-09 MED ORDER — SUGAMMADEX SODIUM 200 MG/2ML IV SOLN
INTRAVENOUS | Status: AC
  Filled 2016-07-09: qty 2

## 2016-07-09 MED ORDER — GABAPENTIN 300 MG PO CAPS
300.0000 mg | ORAL_CAPSULE | Freq: Two times a day (BID) | ORAL | Status: DC
  Administered 2016-07-09 – 2016-07-10 (×2): 300 mg via ORAL
  Filled 2016-07-09 (×2): qty 1

## 2016-07-09 MED ORDER — ALBUTEROL SULFATE (2.5 MG/3ML) 0.083% IN NEBU: 3.0000 mL | INHALATION_SOLUTION | Freq: Four times a day (QID) | RESPIRATORY_TRACT | Status: DC | PRN

## 2016-07-09 MED ORDER — ONDANSETRON HCL 4 MG/2ML IJ SOLN: 4.0000 mg | Freq: Four times a day (QID) | INTRAMUSCULAR | Status: DC | PRN

## 2016-07-09 MED ORDER — BUPIVACAINE LIPOSOME 1.3 % IJ SUSP
20.0000 mL | Freq: Once | INTRAMUSCULAR | Status: AC
  Administered 2016-07-09: 20 mL
  Filled 2016-07-09: qty 20

## 2016-07-09 MED ORDER — LIDOCAINE 2% (20 MG/ML) 5 ML SYRINGE
INTRAMUSCULAR | Status: AC
  Filled 2016-07-09: qty 5

## 2016-07-09 MED ORDER — HYDROMORPHONE HCL 1 MG/ML IJ SOLN
0.2000 mg | INTRAMUSCULAR | Status: DC | PRN
  Administered 2016-07-09 (×2): 0.5 mg via INTRAVENOUS
  Filled 2016-07-09 (×2): qty 0.5

## 2016-07-09 MED ORDER — ENOXAPARIN SODIUM 40 MG/0.4ML ~~LOC~~ SOLN
40.0000 mg | SUBCUTANEOUS | Status: DC
  Administered 2016-07-10: 40 mg via SUBCUTANEOUS
  Filled 2016-07-09: qty 0.4

## 2016-07-09 MED ORDER — LEVOTHYROXINE SODIUM 100 MCG PO TABS
200.0000 ug | ORAL_TABLET | Freq: Every day | ORAL | Status: DC
  Administered 2016-07-10: 200 ug via ORAL
  Filled 2016-07-09: qty 2

## 2016-07-09 MED ORDER — MONTELUKAST SODIUM 10 MG PO TABS
10.0000 mg | ORAL_TABLET | Freq: Every day | ORAL | Status: DC
  Administered 2016-07-09: 10 mg via ORAL
  Filled 2016-07-09: qty 1

## 2016-07-09 MED ORDER — SCOPOLAMINE 1 MG/3DAYS TD PT72
MEDICATED_PATCH | TRANSDERMAL | Status: AC
  Administered 2016-07-09: 1.5 mg via TRANSDERMAL
  Filled 2016-07-09: qty 1

## 2016-07-09 MED ORDER — SUCCINYLCHOLINE CHLORIDE 200 MG/10ML IV SOSY
PREFILLED_SYRINGE | INTRAVENOUS | Status: AC
  Filled 2016-07-09: qty 10

## 2016-07-09 MED ORDER — SUGAMMADEX SODIUM 500 MG/5ML IV SOLN
INTRAVENOUS | Status: DC | PRN
  Administered 2016-07-09: 200 mg via INTRAVENOUS

## 2016-07-09 MED ORDER — SODIUM CHLORIDE 0.9 % IJ SOLN
INTRAMUSCULAR | Status: AC
  Filled 2016-07-09: qty 20

## 2016-07-09 MED ORDER — SODIUM CHLORIDE 0.9 % IJ SOLN
INTRAMUSCULAR | Status: DC | PRN
  Administered 2016-07-09: 20 mL

## 2016-07-09 MED ORDER — PROPOFOL 10 MG/ML IV BOLUS
INTRAVENOUS | Status: DC | PRN
  Administered 2016-07-09: 200 mg via INTRAVENOUS

## 2016-07-09 MED ORDER — HYDROMORPHONE HCL 2 MG/ML IJ SOLN
INTRAMUSCULAR | Status: AC
  Administered 2016-07-09: 0.5 mg
  Filled 2016-07-09: qty 1

## 2016-07-09 MED ORDER — SUCCINYLCHOLINE CHLORIDE 20 MG/ML IJ SOLN
INTRAMUSCULAR | Status: DC | PRN
  Administered 2016-07-09: 120 mg via INTRAVENOUS

## 2016-07-09 MED ORDER — MEPERIDINE HCL 50 MG/ML IJ SOLN: 6.2500 mg | INTRAMUSCULAR | Status: DC | PRN

## 2016-07-09 MED ORDER — LIDOCAINE HCL (CARDIAC) 20 MG/ML IV SOLN
INTRAVENOUS | Status: DC | PRN
  Administered 2016-07-09: 80 mg via INTRAVENOUS

## 2016-07-09 MED ORDER — EPHEDRINE SULFATE 50 MG/ML IJ SOLN
INTRAMUSCULAR | Status: DC | PRN
  Administered 2016-07-09 (×2): 5 mg via INTRAVENOUS
  Administered 2016-07-09: 2.5 mg via INTRAVENOUS
  Administered 2016-07-09 (×2): 5 mg via INTRAVENOUS

## 2016-07-09 MED ORDER — LACTATED RINGERS IV SOLN
INTRAVENOUS | Status: DC | PRN
  Administered 2016-07-09 (×2): via INTRAVENOUS

## 2016-07-09 MED ORDER — DICLOFENAC SODIUM 50 MG PO TBEC
50.0000 mg | DELAYED_RELEASE_TABLET | Freq: Three times a day (TID) | ORAL | Status: DC
  Administered 2016-07-09 – 2016-07-10 (×3): 50 mg via ORAL
  Filled 2016-07-09 (×4): qty 1

## 2016-07-09 MED ORDER — PROPOFOL 10 MG/ML IV BOLUS
INTRAVENOUS | Status: AC
  Filled 2016-07-09: qty 20

## 2016-07-09 MED ORDER — STERILE WATER FOR INJECTION IJ SOLN
INTRAMUSCULAR | Status: AC
Start: 2016-07-09 — End: 2016-07-09
  Filled 2016-07-09: qty 10

## 2016-07-09 MED ORDER — FENTANYL CITRATE (PF) 100 MCG/2ML IJ SOLN
INTRAMUSCULAR | Status: DC | PRN
  Administered 2016-07-09: 100 ug via INTRAVENOUS
  Administered 2016-07-09 (×3): 50 ug via INTRAVENOUS

## 2016-07-09 MED ORDER — ACETAMINOPHEN 10 MG/ML IV SOLN
INTRAVENOUS | Status: DC | PRN
  Administered 2016-07-09: 1000 mg via INTRAVENOUS

## 2016-07-09 SURGICAL SUPPLY — 71 items
APPLICATOR SURGIFLO ENDO (HEMOSTASIS) IMPLANT
BAG LAPAROSCOPIC 12 15 PORT 16 (BASKET) IMPLANT
BAG RETRIEVAL 12/15 (BASKET)
CATH ROBINSON RED A/P 16FR (CATHETERS) IMPLANT
CHLORAPREP W/TINT 26ML (MISCELLANEOUS) ×6 IMPLANT
COVER BACK TABLE 60X90IN (DRAPES) ×3 IMPLANT
COVER SURGICAL LIGHT HANDLE (MISCELLANEOUS) ×3 IMPLANT
COVER TIP SHEARS 8 DVNC (MISCELLANEOUS) ×2 IMPLANT
COVER TIP SHEARS 8MM DA VINCI (MISCELLANEOUS) ×1
DERMABOND ADVANCED (GAUZE/BANDAGES/DRESSINGS) ×1
DERMABOND ADVANCED .7 DNX12 (GAUZE/BANDAGES/DRESSINGS) ×2 IMPLANT
DRAPE ARM DVNC X/XI (DISPOSABLE) ×8 IMPLANT
DRAPE COLUMN DVNC XI (DISPOSABLE) ×2 IMPLANT
DRAPE DA VINCI XI ARM (DISPOSABLE) ×4
DRAPE DA VINCI XI COLUMN (DISPOSABLE) ×1
DRAPE SHEET LG 3/4 BI-LAMINATE (DRAPES) ×6 IMPLANT
DRAPE SURG IRRIG POUCH 19X23 (DRAPES) ×3 IMPLANT
DRSG TELFA 3X8 NADH (GAUZE/BANDAGES/DRESSINGS) ×3 IMPLANT
ELECT PENCIL ROCKER SW 15FT (MISCELLANEOUS) ×3 IMPLANT
ELECT REM PT RETURN 15FT ADLT (MISCELLANEOUS) ×3 IMPLANT
GAUZE SPONGE 4X4 16PLY XRAY LF (GAUZE/BANDAGES/DRESSINGS) ×3 IMPLANT
GLOVE BIO SURGEON STRL SZ 6 (GLOVE) ×12 IMPLANT
GLOVE BIO SURGEON STRL SZ 6.5 (GLOVE) ×6 IMPLANT
GOWN STRL REUS W/ TWL LRG LVL3 (GOWN DISPOSABLE) ×4 IMPLANT
GOWN STRL REUS W/TWL LRG LVL3 (GOWN DISPOSABLE) ×2
HOLDER FOLEY CATH W/STRAP (MISCELLANEOUS) ×3 IMPLANT
IRRIG SUCT STRYKERFLOW 2 WTIP (MISCELLANEOUS) ×3
IRRIGATION SUCT STRKRFLW 2 WTP (MISCELLANEOUS) ×2 IMPLANT
KIT BASIN OR (CUSTOM PROCEDURE TRAY) ×3 IMPLANT
KIT PROCEDURE DA VINCI SI (MISCELLANEOUS)
KIT PROCEDURE DVNC SI (MISCELLANEOUS) IMPLANT
LUBRICANT JELLY K Y 4OZ (MISCELLANEOUS) IMPLANT
MANIPULATOR UTERINE 4.5 ZUMI (MISCELLANEOUS) ×3 IMPLANT
MARKER SKIN DUAL TIP RULER LAB (MISCELLANEOUS) ×3 IMPLANT
NDL SAFETY ECLIPSE 18X1.5 (NEEDLE) ×4 IMPLANT
NEEDLE HYPO 18GX1.5 SHARP (NEEDLE) ×2
NEEDLE HYPO 22GX1.5 SAFETY (NEEDLE) ×3 IMPLANT
NEEDLE SPNL 18GX3.5 QUINCKE PK (NEEDLE) IMPLANT
NS IRRIG 1000ML POUR BTL (IV SOLUTION) IMPLANT
OBTURATOR OPTICAL STANDARD 8MM (TROCAR) ×1
OBTURATOR OPTICAL STND 8 DVNC (TROCAR) ×2
OBTURATOR OPTICALSTD 8 DVNC (TROCAR) ×2 IMPLANT
OCCLUDER COLPOPNEUMO (BALLOONS) ×3 IMPLANT
PACK LITHOTOMY IV (CUSTOM PROCEDURE TRAY) IMPLANT
PAD OB MATERNITY 4.3X12.25 (PERSONAL CARE ITEMS) IMPLANT
PAD POSITIONING PINK XL (MISCELLANEOUS) ×3 IMPLANT
PORT ACCESS TROCAR AIRSEAL 12 (TROCAR) ×2 IMPLANT
PORT ACCESS TROCAR AIRSEAL 5M (TROCAR) ×1
POUCH SPECIMEN RETRIEVAL 10MM (ENDOMECHANICALS) IMPLANT
SEAL CANN UNIV 5-8 DVNC XI (MISCELLANEOUS) ×8 IMPLANT
SEAL XI 5MM-8MM UNIVERSAL (MISCELLANEOUS) ×4
SET TRI-LUMEN FLTR TB AIRSEAL (TUBING) ×3 IMPLANT
SHEET LAVH (DRAPES) ×3 IMPLANT
SOLUTION ELECTROLUBE (MISCELLANEOUS) ×3 IMPLANT
SURGIFLO W/THROMBIN 8M KIT (HEMOSTASIS) IMPLANT
SUT MNCRL AB 4-0 PS2 18 (SUTURE) ×6 IMPLANT
SUT VIC AB 0 CT1 27 (SUTURE) ×3
SUT VIC AB 0 CT1 27XBRD ANTBC (SUTURE) ×6 IMPLANT
SUT VIC AB 3-0 SH 27 (SUTURE) ×3
SUT VIC AB 3-0 SH 27X BRD (SUTURE) ×6 IMPLANT
SYR 10ML LL (SYRINGE) ×3 IMPLANT
SYR 50ML LL SCALE MARK (SYRINGE) ×3 IMPLANT
TOWEL OR 17X26 10 PK STRL BLUE (TOWEL DISPOSABLE) ×3 IMPLANT
TOWEL OR NON WOVEN STRL DISP B (DISPOSABLE) ×3 IMPLANT
TRAP SPECIMEN MUCOUS 40CC (MISCELLANEOUS) IMPLANT
TRAY FOLEY W/METER SILVER 16FR (SET/KITS/TRAYS/PACK) ×3 IMPLANT
TRAY LAPAROSCOPIC (CUSTOM PROCEDURE TRAY) ×3 IMPLANT
TROCAR BLADELESS OPT 5 100 (ENDOMECHANICALS) ×3 IMPLANT
UNDERPAD 30X30 (UNDERPADS AND DIAPERS) ×6 IMPLANT
WATER STERILE IRR 1000ML POUR (IV SOLUTION) IMPLANT
YANKAUER SUCT BULB TIP 10FT TU (MISCELLANEOUS) ×3 IMPLANT

## 2016-07-09 NOTE — Anesthesia Procedure Notes (Signed)
Procedure Name: Intubation Date/Time: 07/09/2016 7:42 AM Performed by: Josephine Igo Pre-anesthesia Checklist: Patient identified, Emergency Drugs available, Suction available, Patient being monitored and Timeout performed Patient Re-evaluated:Patient Re-evaluated prior to inductionOxygen Delivery Method: Circle system utilized Preoxygenation: Pre-oxygenation with 100% oxygen Intubation Type: IV induction and Cricoid Pressure applied Ventilation: Mask ventilation without difficulty Laryngoscope Size: Mac and 4 Grade View: Grade II Tube type: Oral Tube size: 7.5 mm Number of attempts: 1 Airway Equipment and Method: Stylet Placement Confirmation: ETT inserted through vocal cords under direct vision,  positive ETCO2 and breath sounds checked- equal and bilateral Secured at: 20.5 cm Tube secured with: Tape Dental Injury: Teeth and Oropharynx as per pre-operative assessment  Difficulty Due To: Difficulty was anticipated, Difficult Airway- due to large tongue and Difficult Airway- due to reduced neck mobility

## 2016-07-09 NOTE — H&P (View-Only) (Signed)
Consult Note: Gyn-Onc  Consult was requested by Dr. Hulan [name] for the evaluation of Janet [name] 46 y.o. female  CC:  Chief Complaint  Patient presents with  . Endometrial Hyperplasia, Complex    Assessment/Plan:  [name] is a 46 y.o.  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  Transvaginal ultrasound from 04/22/2016 is notable for a uterus measuring 12.8 cm in greatest dimension and an endometrial stripe of 29 mm. Janet [name] was unable to tolerate a speculum examination and on digital evaluation cervix could not be palpated. The options presented to her were  intraoperative placement of a hormonal IUD versus an attempted robotic hysterectomy bilateral salpingo-oophorectomy and sentinel lymph node dissection,  We discussed the possibility that a minimally invasive approach may not be feasible given her BMI and weight distribution.  She understands that if the minimally invasive procedure is not feasible because of findings not limited to reasons such as a failed  tilt test, the ability to maintain intra-abdominal pressure to conduct surgery,steep Trendelenburg or intraoperative fat distribution, then an a progesterone IUD will be placed.  Furthermore the uterine size of 12.8 cm and her prior history of a cesarean section makes it very likely that that a mini laparotomy may be required to remove the specimen.  I extensively reviewed the risks of robotic hysterectomy, bilateral salpingo-oophorectomy sentinel lymph node dissection and possible lymph node dissection of infection; bleeding, damage to nearby organs including bowel, bladder, vessels, nerves, and ureters. We discussed postoperative risks including infection, PE/ DVT, and lymphedema. We reviewed possible need for conversion to open laparotomy, need for possible blood transfusion. I discussed positioning during surgery of trendelenberg and risks of minor facial  swelling and care we take in preoperative positioning.  Janet [name] is aware that her BMI increases many of the these risks. A hemoglobin A1c will be assessed.  Janet [name] has a challenging social situation as her husband is receiving supportive care for his advanced leukemia. She was given the option to place a progesterone IUD temporarily to delay an attempted operative management until care for her husband is not a primary responsibility.   The operative procedure robotic assisted laparoscopic hysterectomy bilateral salpingo-oophorectomy, sentinel lymph node dissection, possible bilateral pelvic lymph node dissection, possible minilaparotomy for delivery of the surgical specimens and possible Mirena IUD placement.  [name] is aware that her surgeon will be Dr. Everitt [name]    HPI: [name] is a 46 y.o.  G2P1 with history of irregular menses.  Janet [name] presented To Dr. Hulan [name] in  with a report of bleeding daily for almost a month. Janet [name] was unable to tolerate an office examination and thus she underwent hysteroscopy D&C on 06/11/2016.  Solid G was notable for complex atypical hyperplasia with a few small foci suspicious for early/microscopic adenocarcinoma. She presented to the emergency room on May 6 and was diagnosed with endometritis. She's currently receiving course of Flagyl which will be completed on 06/23/2016.  There is no family history of gynecologic GI or breast malignancies.    Review of Systems:  Constitutional  Feels well,   Cardiovascular  No chest pain, shortness of breath, or edema  Pulmonary  No cough or wheeze.  Gastro Intestinal  No nausea, vomitting, or diarrhoea. No bright red blood per rectum, no abdominal pain, no change in bowel movement, or constipation.  Genito Urinary  No frequency, urgency, dysuria, vaginal spotting Musculo Skeletal  No myalgia, arthralgia,  joint swelling or pain  Neurologic  No weakness, numbness,  change in gait,  Psychology  No depression, anxiety from diagnoses and social situation  Current Meds:  Outpatient Encounter Prescriptions as of 06/20/2016  Medication Sig  . albuterol (PROVENTIL HFA;VENTOLIN HFA) 108 (90 Base) MCG/ACT inhaler Inhale 1-2 puffs into the lungs every 6 (six) hours as needed for wheezing or shortness of breath.  Marland Kitchen albuterol (PROVENTIL) (2.5 MG/3ML) 0.083% nebulizer solution Take 3 mLs (2.5 mg total) by nebulization every 6 (six) hours as needed for wheezing or shortness of breath.  Marland Kitchen amoxicillin-clavulanate (AUGMENTIN) 875-125 MG tablet Take 1 tablet by mouth 2 (two) times daily.  . cetirizine (ZYRTEC) 10 MG tablet Take 10 mg by mouth daily.  . citalopram (CELEXA) 40 MG tablet Take 1 tablet (40 mg total) by mouth daily. (Patient taking differently: Take 40 mg by mouth at bedtime. )  . Fluticasone-Salmeterol (ADVAIR DISKUS) 250-50 MCG/DOSE AEPB Inhale 1 puff into the lungs 2 (two) times daily.  Marland Kitchen gabapentin (NEURONTIN) 300 MG capsule Take 1 capsule (300 mg total) by mouth 2 (two) times daily.  Marland Kitchen ibuprofen (ADVIL,MOTRIN) 600 MG tablet Take 1 tablet (600 mg total) by mouth every 6 (six) hours as needed.  Marland Kitchen levothyroxine (SYNTHROID, LEVOTHROID) 100 MCG tablet Take 2 tablets (200 mcg total) by mouth daily before breakfast.  . meloxicam (MOBIC) 7.5 MG tablet Take 1 tablet (7.5 mg total) by mouth daily.  . metroNIDAZOLE (FLAGYL) 500 MG tablet Take 500 mg by mouth 2 (two) times daily.  . montelukast (SINGULAIR) 10 MG tablet Take 1 tablet (10 mg total) by mouth at bedtime.   No facility-administered encounter medications on file as of 06/20/2016.     Allergy:  Allergies  Allergen Reactions  . Ciprofloxacin Shortness Of Breath    Social Hx:   Social History   Social History  . Marital status: Married    Spouse name: N/A  . Number of children: N/A  . Years of education: N/A   Occupational History  . Not on file.   Social History Main Topics  . Smoking  status: Never Smoker  . Smokeless tobacco: Never Used  . Alcohol use No  . Drug use: No  . Sexual activity: Not Currently    Birth control/ protection: None   Other Topics Concern  . Not on file   Social History Narrative  . No narrative on file   Husband was diagnosed with leukemia in January 2018 with a life expectancy of approximately 6 months. At this time he is receiving supportive care and regularly is managed with blood and platelet transfusions.  Regular home nursing visits are in place.   Micala Saltsman denies history of sexual or physical abuse.  Past Surgical Hx:  Past Surgical History:  Procedure Laterality Date  . CARPAL TUNNEL RELEASE    . CESAREAN SECTION    . CHOLECYSTECTOMY    . cyst removal     uterus  . DILATION AND CURETTAGE OF UTERUS     still birth  . DILATION AND CURETTAGE OF UTERUS N/A 06/11/2016   Procedure: DILATATION AND CURETTAGE;  Surgeon: Emily Filbert, MD;  Location: [facility];  Service: Gynecology;  Laterality: N/A;  . SINUS EXPLORATION    . WISDOM TOOTH EXTRACTION      Past Medical Hx:  Past Medical History:  Diagnosis Date  . Anemia   . Asthma   . Depression   . Hypothyroidism   . Pre-diabetes   . Sleep apnea   .  Thyroid disease     Past Gynecological History:  Gravida 2 para 1 menarche at 71 with irregular menses since last Pap 06/11/2016 within normal limits no history of abnormal Pap test reports history of OCP use in her 90s  Family Hx: No family history on file.  Vitals:  Blood pressure (!) 148/58, pulse 78, temperature 98 F (36.7 C), temperature source Oral, resp. rate 18, height 5\' 9"  (1.753 m), weight (!) 359 lb (162.8 kg). Body mass index is 53.02 kg/m. Wt Readings from Last 3 Encounters:  06/20/16 (!) 359 lb (162.8 kg)  06/19/16 (!) 359 lb 1.6 oz (162.9 kg)  06/06/16 (!) 358 lb 2 oz (162.4 kg)    Physical Exam: WD in NAD Neck  Supple NROM, without any enlargements.  Lymph Node Survey No cervical supraclavicular or  inguinal adenopathy Cardiovascular  Pulse normal rate, regularity and rhythm. S1 and S2 normal.  Lungs  Clear to auscultation bilaterally, without wheezes/crackles/rhonchi. Good air movement.  Skin  No rash/lesions/breakdown  Psychiatry  Alert and oriented appropriate mood affect speech and reasoning. Abdomen  Normoactive bowel sounds, abdomen soft, non-tender. large pannus, .weight predominantly in the thorax  Back No CVA tenderness Genito Urinary  Vulva/vagina: Normal external female genitalia.  No lesions. No discharge or bleeding.  Bladder/urethra:  No lesions or masses  Vagina:blood in vault  Cervix:unable to assess with speculum or digital examination  Uterus: Unable to assess  Adnexa: unable to assess Rectal  Good tone, no masses no cul de sac nodularity.  Extremities  No bilateral cyanosis, clubbing or edema.   Janet Morning, MD, PhD 06/20/2016, 12:30 PM

## 2016-07-09 NOTE — Anesthesia Postprocedure Evaluation (Signed)
Anesthesia Post Note  Patient: Janet [name]  Procedure(s) Performed: Procedure(s) (LRB): XI ROBOTIC ASSISTED TOTAL HYSTERECTOMY WITH BILATERAL SALPINGO OOPHORECTOMY FOR UTERUS GREATER THAN 250 GRAMS, LYSIS OF ADHESIONS (Bilateral)  Patient location during evaluation: PACU Anesthesia Type: General Level of consciousness: awake and alert and oriented Pain management: pain level controlled Vital Signs Assessment: post-procedure vital signs reviewed and stable Respiratory status: spontaneous breathing, nonlabored ventilation, respiratory function stable and patient connected to nasal cannula oxygen Cardiovascular status: blood pressure returned to baseline and stable Postop Assessment: no signs of nausea or vomiting Anesthetic complications: no       Last Vitals:  Vitals:   07/09/16 1130 07/09/16 1141  BP: 129/64 135/66  Pulse: 68 72  Resp: 12 10  Temp:  36.9 C    Last Pain:  Vitals:   07/09/16 1141  TempSrc:   PainSc: 7                  Arturo Freundlich A.

## 2016-07-09 NOTE — Telephone Encounter (Signed)
Scheduled post op appt scheduled for the patient. Appt will appear on the patient's discharge summary.

## 2016-07-09 NOTE — Transfer of Care (Signed)
Immediate Anesthesia Transfer of Care Note  Patient: Janet [name]  Procedure(s) Performed: Procedure(s): XI ROBOTIC ASSISTED TOTAL HYSTERECTOMY WITH BILATERAL SALPINGO OOPHORECTOMY FOR UTERUS GREATER THAN 250 GRAMS, LYSIS OF ADHESIONS (Bilateral)  Patient Location: PACU  Anesthesia Type:General  Level of Consciousness: awake, oriented, drowsy, patient cooperative, lethargic and responds to stimulation  Airway & Oxygen Therapy: Patient Spontanous Breathing and Patient connected to face mask oxygen  Post-op Assessment: Report given to RN, Post -op Vital signs reviewed and stable and Patient moving all extremities  Post vital signs: Reviewed and stable  Last Vitals:  Vitals:   07/09/16 0540  BP: (!) 129/53  Pulse: 75  Resp: 20  Temp: 36.9 C    Last Pain:  Vitals:   07/09/16 0540  TempSrc: Oral  PainSc: 4       Patients Stated Pain Goal: 4 (27/78/24 2353)  Complications: No apparent anesthesia complications

## 2016-07-09 NOTE — Anesthesia Procedure Notes (Signed)
Date/Time: 07/09/2016 7:50 AM Performed by: Josephine Igo Ventilation: Nasal airway inserted- appropriate to patient size

## 2016-07-09 NOTE — Anesthesia Preprocedure Evaluation (Addendum)
Anesthesia Evaluation  Patient identified by MRN, date of birth, ID band Patient awake    Reviewed: Allergy & Precautions, NPO status , Patient's Chart, lab work & pertinent test results  Airway Mallampati: III  TM Distance: >3 FB Neck ROM: Full    Dental no notable dental hx. (+) Teeth Intact   Pulmonary asthma , sleep apnea and Continuous Positive Airway Pressure Ventilation ,    Pulmonary exam normal breath sounds clear to auscultation       Cardiovascular + Valvular Problems/Murmurs  Rhythm:Regular Rate:Normal + Systolic murmurs    Neuro/Psych PSYCHIATRIC DISORDERS Depression Neuropathy    GI/Hepatic   Endo/Other  diabetes, Well Controlled, Type 2, Oral Hypoglycemic AgentsHypothyroidism Morbid obesityHyperlipidemia  Renal/GU   negative genitourinary   Musculoskeletal  (+) Arthritis ,   Abdominal (+) + obese,   Peds  Hematology  (+) anemia , Thrombocytopenia   Anesthesia Other Findings   Reproductive/Obstetrics PMB Uterine Ca                            Anesthesia Physical  Anesthesia Plan  ASA: III  Anesthesia Plan: General   Post-op Pain Management:    Induction: Intravenous  Airway Management Planned: Oral ETT  Additional Equipment:   Intra-op Plan:   Post-operative Plan: Extubation in OR  Informed Consent: I have reviewed the patients History and Physical, chart, labs and discussed the procedure including the risks, benefits and alternatives for the proposed anesthesia with the patient or authorized representative who has indicated his/her understanding and acceptance.   Dental advisory given  Plan Discussed with: CRNA, Anesthesiologist and Surgeon  Anesthesia Plan Comments:         Anesthesia Quick Evaluation

## 2016-07-09 NOTE — Interval H&P Note (Signed)
History and Physical Interval Note:  07/09/2016 7:17 AM  Janet [name]  has presented today for surgery, with the diagnosis of uterine cancer  The various methods of treatment have been discussed with the patient and family. After consideration of risks, benefits and other options for treatment, the patient has consented to  Procedure(s): XI ROBOTIC ASSISTED TOTAL HYSTERECTOMY WITH BILATERAL SALPINGO OOPHORECTOMY SENTINAL LYMPH NODE BIOPSY AND POSSIBLE D&C AND POSSIBLE IUD PLACEMENT (Bilateral) POSSIBLE DILATATION AND CURETTAGE (N/A) POSSIBLE INTRAUTERINE DEVICE (IUD) INSERTION (N/A) as a surgical intervention .  The patient's history has been reviewed, patient examined, no change in status, stable for surgery.  I have reviewed the patient's chart and labs.  Questions were answered to the patient's satisfaction.     [name]

## 2016-07-09 NOTE — Op Note (Signed)
OPERATIVE NOTE 07/09/16  Surgeon: [name]   Assistants: Dr Lahoma Crocker (an MD assistant was necessary for tissue manipulation, management of robotic instrumentation, retraction and positioning due to the complexity of the case and hospital policies).   Anesthesia: General endotracheal anesthesia  ASA Class: 3   Pre-operative Diagnosis: complex atypical hyperplasia, morbid obesity  Post-operative Diagnosis: same  Operation: Robotic-assisted laparoscopic total hysterectomy for uterus greater than 250gms with bilateral salpingoophorectomy, lysis of adhsions.  Surgeon: [name]  Assistant Surgeon: Lahoma Crocker MD  Anesthesia: GET  Urine Output: 400  Operative Findings:  : 16cm bulky uterus, dense adhesions between omentum and anterior abdominal wall. Adhesions (dense) between left ovary and sigmoid colon. 5cm left ovarian cyst (filled with old blood, consistent with endometrioma).  Extreme morbid obesity (BMI 55kg/m2) resulting in additional 60 minutes of operating time and complexity and additional OR personnel required for patient positioning and exposure.  Estimated Blood Loss:  less than 100 mL      Total IV Fluids: 1,000 ml         Specimens: uterus, cervix, bilateral tubes and ovaries.         Complications:  None; patient tolerated the procedure well.         Disposition: PACU - hemodynamically stable.  Procedure Details  The patient was seen in the Holding Room. The risks, benefits, complications, treatment options, and expected outcomes were discussed with the patient.  The patient concurred with the proposed plan, giving informed consent.  The site of surgery properly noted/marked. The patient was identified as Janet [name] and the procedure verified as a Robotic-assisted hysterectomy with bilateral salpingo oophorectomy. A Time Out was held and the above information confirmed.  After induction of anesthesia, the patient was  draped and prepped in the usual sterile manner. Pt was placed in supine position after anesthesia and draped and prepped in the usual sterile manner. The abdominal drape was placed after the CholoraPrep had been allowed to dry for 3 minutes.  Her arms were tucked to her side with all appropriate precautions.  The shoulders were stabilized with padded shoulder blocks applied to the acromium processes.  The patient was placed in the semi-lithotomy position in [place].  The perineum was prepped with Betadine. The patient was then prepped. Foley catheter was placed.  A sterile speculum was placed in the vagina.  The cervix was grasped with a single-tooth tenaculum and dilated with Kennon Rounds dilators.  The ZUMI uterine manipulator with a medium colpotomizer ring was placed without difficulty.  A pneum occluder balloon was placed over the manipulator.  OG tube placement was confirmed and to suction.   Next, a 5 mm skin incision was made 1 cm below the subcostal margin in the midclavicular line.  The 5 mm Optiview port and scope was used for direct entry.  Opening pressure was under 10 mm CO2.  The abdomen was insufflated and the findings were noted as above.   At this point and all points during the procedure, the patient's intra-abdominal pressure did not exceed 15 mmHg. Next, a 10 mm skin incision was made a handspan above the umbilicus and a right and left port was placed about 10 cm lateral to the robot port on the right and left side.  A fourth arm was placed in the left lower quadrant 2 cm above and superior and medial to the anterior superior iliac spine.  All ports were placed under direct visualization.  The patient was placed  in steep Trendelenburg.  Bowel was folded away into the upper abdomen.  The robot was docked in the normal manner.  The omental adhesions were taken down from their attachments to the anterior abdominal wall with bipolar and scissors for approximately 20 minutes. The omental fat was  extracted from the umbilicus.   The hysterectomy was started after the round ligament on the right side was incised and the retroperitoneum was entered and the pararectal space was developed.  The ureter was noted to be on the medial leaf of the broad ligament.  The peritoneum above the ureter was incised and stretched and the infundibulopelvic ligament was skeletonized, cauterized and cut.  The posterior peritoneum was taken down to the level of the KOH ring.  The anterior peritoneum was also taken down.  The bladder flap was created to the level of the KOH ring.  The uterine artery on the right side was skeletonized, cauterized and cut in the normal manner.  Visualization was extremely challenging due to extreme intraperitoneal adiposity.   On the left there were dense adhesions between the sigmoid colon and the left ovarian cyst. These were taken down sharply. A similar procedure was performed on the left as had been performed on the right side of the hysterectomy. The colpotomy was made and the uterus, cervix, bilateral ovaries and tubes were amputated but due to their size, could not be delivered through the vagina. The uterus was placed in an endocatch bag for later retrieval. Pedicles were inspected and excellent hemostasis was achieved.    An area of deserosalized sigmoid colon was oversewn with 3-0 vicryl running suture where it had been adherent to the left ovary.  The colpotomy at the vaginal cuff was closed with Vicryl on a CT1 needle in an interrupted figure of 8 manner.  Irrigation was used and excellent hemostasis was achieved.  At this point in the procedure was completed.  Robotic instruments were removed under direct visulaization.  The robot was undocked.   The midline port was extended to 6cm. The bovie was used to open the subcutaneous fat and the fascia. The muscle bellies were parted in the midline and the peritoneal incision was extended sharply. The uterine specimen was delivered  through the abdominal incision within the bag. The fascia and posterior sheath were closed at this site with #1 looped PDS. The subcutaneous fat was closed with 3-0 vicryl. The deep tissues were infiltrated with a mixture of 20cc each of exparel, quarter percent marcaine and saline. The skin was closed with monocryl with dermabond.    The 10 mm ports were closed with Vicryl on a UR-5 needle and the fascia was closed with 0 Vicryl on a UR-5 needle.  The skin was closed with 4-0 Vicryl in a subcuticular manner.  Dermabond was applied.  Sponge, lap and needle counts correct x 2.  The patient was taken to the recovery room in stable condition.  The vagina was swabbed with  minimal bleeding noted.   All instrument and needle counts were correct x  3.   The patient was transferred to the recovery room in a stable condition.  [name], MD

## 2016-07-10 ENCOUNTER — Encounter (HOSPITAL_COMMUNITY): Payer: Self-pay | Admitting: Gynecologic Oncology

## 2016-07-10 DIAGNOSIS — N8502 Endometrial intraepithelial neoplasia [EIN]: Secondary | ICD-10-CM | POA: Diagnosis not present

## 2016-07-10 LAB — BASIC METABOLIC PANEL
ANION GAP: 8 (ref 5–15)
BUN: 8 mg/dL (ref 6–20)
CO2: 24 mmol/L (ref 22–32)
Calcium: 8.7 mg/dL — ABNORMAL LOW (ref 8.9–10.3)
Chloride: 107 mmol/L (ref 101–111)
Creatinine, Ser: 0.85 mg/dL (ref 0.44–1.00)
GLUCOSE: 144 mg/dL — AB (ref 65–99)
POTASSIUM: 4.7 mmol/L (ref 3.5–5.1)
Sodium: 139 mmol/L (ref 135–145)

## 2016-07-10 LAB — CBC
HCT: 24.1 % — ABNORMAL LOW (ref 36.0–46.0)
Hemoglobin: 7.4 g/dL — ABNORMAL LOW (ref 12.0–15.0)
MCH: 21.8 pg — ABNORMAL LOW (ref 26.0–34.0)
MCHC: 30.7 g/dL (ref 30.0–36.0)
MCV: 71.1 fL — AB (ref 78.0–100.0)
PLATELETS: 127 10*3/uL — AB (ref 150–400)
RBC: 3.39 MIL/uL — AB (ref 3.87–5.11)
RDW: 16 % — ABNORMAL HIGH (ref 11.5–15.5)
WBC: 8.7 10*3/uL (ref 4.0–10.5)

## 2016-07-10 MED ORDER — FERROUS GLUCONATE 324 (38 FE) MG PO TABS: 324.0000 mg | ORAL_TABLET | Freq: Every day | ORAL | 3 refills | Status: DC

## 2016-07-10 MED ORDER — OXYCODONE-ACETAMINOPHEN 5-325 MG PO TABS: 1.0000 | ORAL_TABLET | ORAL | 0 refills | Status: DC | PRN

## 2016-07-10 MED ORDER — DICLOFENAC SODIUM 50 MG PO TBEC: 50.0000 mg | DELAYED_RELEASE_TABLET | Freq: Three times a day (TID) | ORAL | 1 refills | Status: DC

## 2016-07-10 MED ORDER — SENNA 8.6 MG PO TABS: 1.0000 | ORAL_TABLET | Freq: Every day | ORAL | 0 refills | Status: DC

## 2016-07-10 NOTE — Discharge Instructions (Signed)
07/09/2016  Return to work: 6 weeks  Activity: 1. Be up and out of the bed during the day.  Take a nap if needed.  You may walk up steps but be careful and use the hand rail.  Stair climbing will tire you more than you think, you may need to stop part way and rest.   2. No lifting or straining for 6 weeks.  3. No driving for 2 weeks.  Do Not drive if you are taking narcotic pain medicine.  4. Shower daily.  Use soap and water on your incision and pat dry; don't rub.   5. No sexual activity and nothing in the vagina for 8 weeks.  Medications:  - Take ibuprofen or diclofenac and tylenol first line for pain control. Take these regularly (every 6 hours) to decrease the build up of pain.  - If necessary, for severe pain not relieved by ibuprofen, take percocet.  - While taking percocet you should take sennakot every night to reduce the likelihood of constipation. If this causes diarrhea, stop its use.  - take iron replacement (ferrous gluconate) daily for 1 month to increase blood counts. Take senokot while using this.  Diet: 1. Low sodium Heart Healthy Diet is recommended.  2. It is safe to use a laxative if you have difficulty moving your bowels.   Wound Care: 1. Keep clean and dry.  Shower daily.  Reasons to call the Doctor:   Fever - Oral temperature greater than 100.4 degrees Fahrenheit  Foul-smelling vaginal discharge  Difficulty urinating  Nausea and vomiting  Increased pain at the site of the incision that is unrelieved with pain medicine.  Difficulty breathing with or without chest pain  New calf pain especially if only on one side  Sudden, continuing increased vaginal bleeding with or without clots.   Follow-up: 1. See Everitt Amber in 4 weeks.  Contacts: For questions or concerns you should contact:  Dr. Everitt Amber at [phone] After hours and on week-ends call [phone] and ask to speak to the physician on call for Gynecologic Oncology

## 2016-07-10 NOTE — Progress Notes (Addendum)
Chaplain visited with patient to discuss Advanced Directive per consult.  Patient wants to complete Advanced Directive before leaving the hospital.  While talking with the patient the Chaplain learned of the patient's husband passing away from [place] approximately two weeks ago and also her mother in law passed away last December 27, 2022 of a type of cancer.  Patient's son is receiving help from [facility].  He is 10.  Chaplain provided active listening and ministry of presence for his patient.   Chaplain will seek assistance in getting AD completed.  [place] assisted with completion of Advanced Directive.  Copy placed on patient's chart and a copy placed in Medical Records.    07/10/16 0931  Clinical Encounter Type  Visited With Patient  Visit Type Initial;Spiritual support;Social support;Post-op  Referral From Physician  Consult/Referral To Chaplain  Stress Factors  Patient Stress Factors Loss;Health changes

## 2016-07-10 NOTE — Discharge Summary (Signed)
Physician Discharge Summary  Patient ID: Janet [name] MRN: [id number] DOB/AGE: 48/11/1970 48 y.o.  Admit date: 07/09/2016 Discharge date: 07/10/2016  Admission Diagnoses: Complex atypical endometrial hyperplasia  Discharge Diagnoses:  Principal Problem:   Complex atypical endometrial hyperplasia Active Problems:   Morbid obesity ([place])   DUB (dysfunctional uterine bleeding)   Discharged Condition: good  Hospital Course:  1/ patient was admitted on 07/10/16 for a robotic hysterectomy, BSO with minilaparotomy for complex atypical hyperplasia 2/ surgery was uncomplicated  3/ on postoperative day 1 the patient was meeting discharge criteria: tolerating PO, voiding urine, ambulating, pain well controlled on oral medications.  4/ new medications on discharge include ferrous gluconate for chronic iron deficiency anemia, sennakot, percocet.   Consults: None  Significant Diagnostic Studies:  CBC    Component Value Date/Time   WBC 8.7 07/10/2016 0523   RBC 3.39 (L) 07/10/2016 0523   HGB 7.4 (L) 07/10/2016 0523   HCT 24.1 (L) 07/10/2016 0523   PLT 127 (L) 07/10/2016 0523   MCV 71.1 (L) 07/10/2016 0523   MCH 21.8 (L) 07/10/2016 0523   MCHC 30.7 07/10/2016 0523   RDW 16.0 (H) 07/10/2016 0523   LYMPHSABS 1.0 07/02/2016 1035   MONOABS 0.2 07/02/2016 1035   EOSABS 0.2 07/02/2016 1035   BASOSABS 0.0 07/02/2016 1035    Treatments: surgery: see above  Discharge Exam: Blood pressure (!) 121/52, pulse 62, temperature 98.6 F (37 C), temperature source Oral, resp. rate 16, height 5\' 9"  (1.753 m), weight (!) 362 lb (164.2 kg), SpO2 95 %. General appearance: alert and cooperative GI: soft, non-tender; bowel sounds normal; no masses,  no organomegaly Incision/Wound: clean and dry with dermabond  Disposition: 01-Home or Self Care  Discharge Instructions    (HEART FAILURE PATIENTS) Call MD:  Anytime you have any of the following symptoms: 1) 3 pound weight gain in 24 hours or 5  pounds in 1 week 2) shortness of breath, with or without a dry hacking cough 3) swelling in the hands, feet or stomach 4) if you have to sleep on extra pillows at night in order to breathe.    Complete by:  As directed    Call MD for:  difficulty breathing, headache or visual disturbances    Complete by:  As directed    Call MD for:  extreme fatigue    Complete by:  As directed    Call MD for:  hives    Complete by:  As directed    Call MD for:  persistant dizziness or light-headedness    Complete by:  As directed    Call MD for:  persistant nausea and vomiting    Complete by:  As directed    Call MD for:  redness, tenderness, or signs of infection (pain, swelling, redness, odor or green/yellow discharge around incision site)    Complete by:  As directed    Call MD for:  severe uncontrolled pain    Complete by:  As directed    Call MD for:  temperature >100.4    Complete by:  As directed    Diet - low sodium heart healthy    Complete by:  As directed    Diet general    Complete by:  As directed    Driving Restrictions    Complete by:  As directed    No driving for 7 days or until off narcotic pain medication   Increase activity slowly    Complete by:  As directed  Remove dressing in 24 hours    Complete by:  As directed    Sexual Activity Restrictions    Complete by:  As directed    No intercourse for 6 weeks     Allergies as of 07/10/2016      Reactions   Ciprofloxacin Shortness Of Breath   Shrimp [shellfish Allergy]       Medication List    TAKE these medications   albuterol (2.5 MG/3ML) 0.083% nebulizer solution Commonly known as:  PROVENTIL Take 3 mLs (2.5 mg total) by nebulization every 6 (six) hours as needed for wheezing or shortness of breath.   albuterol 108 (90 Base) MCG/ACT inhaler Commonly known as:  PROVENTIL HFA;VENTOLIN HFA Inhale 1-2 puffs into the lungs every 6 (six) hours as needed for wheezing or shortness of breath.   amoxicillin-clavulanate  875-125 MG tablet Commonly known as:  AUGMENTIN Take 1 tablet by mouth 2 (two) times daily.   cetirizine 10 MG tablet Commonly known as:  ZYRTEC Take 10 mg by mouth daily.   citalopram 40 MG tablet Commonly known as:  CELEXA Take 1 tablet (40 mg total) by mouth daily. What changed:  when to take this   diclofenac 50 MG EC tablet Commonly known as:  VOLTAREN Take 1 tablet (50 mg total) by mouth 3 (three) times daily.   ferrous gluconate 324 MG tablet Commonly known as:  FERGON Take 1 tablet (324 mg total) by mouth daily with breakfast.   Fluticasone-Salmeterol 250-50 MCG/DOSE Aepb Commonly known as:  ADVAIR DISKUS Inhale 1 puff into the lungs 2 (two) times daily.   gabapentin 300 MG capsule Commonly known as:  NEURONTIN Take 1 capsule (300 mg total) by mouth 2 (two) times daily.   ibuprofen 600 MG tablet Commonly known as:  ADVIL,MOTRIN Take 1 tablet (600 mg total) by mouth every 6 (six) hours as needed.   levothyroxine 100 MCG tablet Commonly known as:  SYNTHROID, LEVOTHROID TAKE 2 TABLETS BY MOUTH DAILY BEFORE BREAKFAST.   meloxicam 7.5 MG tablet Commonly known as:  MOBIC Take 1 tablet (7.5 mg total) by mouth daily.   montelukast 10 MG tablet Commonly known as:  SINGULAIR Take 1 tablet (10 mg total) by mouth at bedtime.   oxyCODONE-acetaminophen 5-325 MG tablet Commonly known as:  PERCOCET/ROXICET Take 1-2 tablets by mouth every 4 (four) hours as needed (moderate to severe pain (when tolerating fluids)).   senna 8.6 MG Tabs tablet Commonly known as:  SENOKOT Take 1 tablet (8.6 mg total) by mouth at bedtime.        Signed: [name] 07/10/2016, 9:06 AM

## 2016-07-10 NOTE — Progress Notes (Signed)
Pt was discharged home today. Instructions were reviewed with patient, and questions were answered. Pt was taken to main entrance via wheelchair by NT.  

## 2016-07-15 ENCOUNTER — Telehealth: Payer: Self-pay

## 2016-07-15 NOTE — Telephone Encounter (Signed)
LM for Janet [name] to call back if she is continuing to have low grade temps and drainage from 2 of her incisions.

## 2016-07-17 ENCOUNTER — Telehealth: Payer: Self-pay | Admitting: Gynecologic Oncology

## 2016-07-17 DIAGNOSIS — T8140XA Infection following a procedure, unspecified, initial encounter: Secondary | ICD-10-CM

## 2016-07-17 MED ORDER — CEPHALEXIN 500 MG PO CAPS: 500.0000 mg | ORAL_CAPSULE | Freq: Four times a day (QID) | ORAL | 0 refills | Status: DC

## 2016-07-17 NOTE — Telephone Encounter (Signed)
Patient called with complaints of glue coming off of her large incision with yellow drainage from the incision.  Reports having a knot under the incision.  Dr. Denman George made aware of the above and order taken for Keflex 500 mg QID for seven days.  Patient advised to call with an update and with any changes.

## 2016-07-18 ENCOUNTER — Telehealth: Payer: Self-pay | Admitting: Gynecologic Oncology

## 2016-07-18 NOTE — Telephone Encounter (Signed)
Called patient to have her come in today to assess her incisions.  She called the after hours number again last pm with concerns about hardened areas under two of her incisions.  Keflex was sent in yesterday after our discussion.  Patient states she wants to hold off and not come in today.  She states she will take her antibiotics and call our office in the am if she is still concerned to come in.  She states her fever broke last pm and she has not had one since that time.

## 2016-07-19 ENCOUNTER — Telehealth: Payer: Self-pay | Admitting: *Deleted

## 2016-07-19 ENCOUNTER — Ambulatory Visit: Payer: Self-pay | Attending: Gynecologic Oncology | Admitting: Gynecologic Oncology

## 2016-07-19 ENCOUNTER — Encounter: Payer: Self-pay | Admitting: Gynecologic Oncology

## 2016-07-19 ENCOUNTER — Other Ambulatory Visit: Payer: Self-pay | Admitting: *Deleted

## 2016-07-19 ENCOUNTER — Ambulatory Visit: Payer: Self-pay

## 2016-07-19 VITALS — BP 125/48 | HR 78 | Temp 99.6°F | Resp 20 | Wt 360.0 lb

## 2016-07-19 DIAGNOSIS — Z79899 Other long term (current) drug therapy: Secondary | ICD-10-CM | POA: Insufficient documentation

## 2016-07-19 DIAGNOSIS — Z90722 Acquired absence of ovaries, bilateral: Secondary | ICD-10-CM | POA: Insufficient documentation

## 2016-07-19 DIAGNOSIS — IMO0001 Reserved for inherently not codable concepts without codable children: Secondary | ICD-10-CM

## 2016-07-19 DIAGNOSIS — J45909 Unspecified asthma, uncomplicated: Secondary | ICD-10-CM | POA: Insufficient documentation

## 2016-07-19 DIAGNOSIS — T814XXA Infection following a procedure, initial encounter: Secondary | ICD-10-CM

## 2016-07-19 DIAGNOSIS — N8501 Benign endometrial hyperplasia: Secondary | ICD-10-CM

## 2016-07-19 DIAGNOSIS — Z90710 Acquired absence of both cervix and uterus: Secondary | ICD-10-CM | POA: Insufficient documentation

## 2016-07-19 DIAGNOSIS — Z9079 Acquired absence of other genital organ(s): Secondary | ICD-10-CM | POA: Insufficient documentation

## 2016-07-19 DIAGNOSIS — R7303 Prediabetes: Secondary | ICD-10-CM | POA: Insufficient documentation

## 2016-07-19 DIAGNOSIS — C541 Malignant neoplasm of endometrium: Secondary | ICD-10-CM | POA: Insufficient documentation

## 2016-07-19 DIAGNOSIS — Z6841 Body Mass Index (BMI) 40.0 and over, adult: Secondary | ICD-10-CM | POA: Insufficient documentation

## 2016-07-19 DIAGNOSIS — F329 Major depressive disorder, single episode, unspecified: Secondary | ICD-10-CM | POA: Insufficient documentation

## 2016-07-19 DIAGNOSIS — G4730 Sleep apnea, unspecified: Secondary | ICD-10-CM | POA: Insufficient documentation

## 2016-07-19 DIAGNOSIS — E039 Hypothyroidism, unspecified: Secondary | ICD-10-CM | POA: Insufficient documentation

## 2016-07-19 NOTE — Patient Instructions (Signed)
Plan to change the dressing twice daily.  We will see you in the office on Monday.  Continue taking your antibiotics as prescribed.  We will also let you know the results of your culture from today.  [place] the beach, if you develop a fever again, chills, concerns with your incision call [phone] to speak with the after hours representative or go to the Emergency Room.   How to Change Your Dressing A dressing is a material that is placed in and over wounds. A dressing helps your wound to heal by protecting it from:  Bacteria.  Worse injury.  Being too dry or too wet.  What are the risks? The sticky (adhesive) tape that is used with a dressing may make your skin sore or irritated, or it may cause a rash. These are the most common problems. However, more serious problems can develop, such as:  Bleeding.  Infection.  How to change your dressing Getting Ready to Change Your Dressing   Take a shower before you do the first dressing change of the day. If your doctor does not want your wound to get wet and your dressing is not waterproof, you may need to put plastic leak-proof sealing wrap on your dressing to protect it.  If needed, take pain medicine as told by your doctor 30 minutes before you change your dressing.  Set up a clean station for wound care. You will need: ? A plastic trash bag that is open and ready to use. ? Hand sanitizer. ? Wound cleanser or salt-water solution (saline) as told by your doctor. ? New dressing material or bandages. Make sure to open the dressing package so the dressing stays on the inside of the package. You may also need these supplies in your clean station:  A box of vinyl gloves.  Tape.  Skin protectant. This may be a wipe, film, or spray.  Clean or germ-free (sterile) scissors.  A cotton-tipped applicator.  Taking Off Your Old Dressing  Wash your hands with soap and water. Dry your hands with a clean towel. If you cannot use  soap and water, use hand sanitizer.  If you are using gloves, put on the gloves before you take off the dressing.  Gently take off any adhesive or tape by pulling it off in the direction of your hair growth. Only touch the outside edges of the dressing.  Take off the dressing. If the dressing sticks to your skin, wet the dressing with a germ-free salt-water solution. This helps it come off more easily.  Take off any gauze or packing in your wound.  Throw the old dressing supplies into the ready trash bag.  Take off your gloves. To take off each glove, grab the cuff with your other hand and turn the glove inside out. Put the gloves in the trash right away.  Wash your hands with soap and water. Dry your hands with a clean towel. If you cannot use soap and water, use hand sanitizer. Cleaning Your Wound  Follow instructions from your doctor about how to clean your wound. This may include using a salt-water solution or recommended wound cleanser.  Do not use over-the-counter medicated or antiseptic creams, sprays, liquids, or dressings unless your doctor tells you to do that.  Use a clean gauze pad to clean the area fully with the salt-water solution or wound cleanser that your doctor recommends.  Throw the gauze pad into the trash bag.  Wash your hands with soap  and water. Dry your hands with a clean towel. If you cannot use soap and water, use hand sanitizer. Putting on the Dressing  If your doctor recommended a skin protectant, put it on the skin around the wound.  Cover the wound with the recommended dressing, such as a nonstick gauze or bandage. Make sure to touch only the outside edges of the dressing. Do not touch the inside of the dressing.  Attach the dressing so all sides stay in place. You may do this with the attached medical adhesive, roll gauze, or tape. If you use tape, do not wrap the tape all the way around your arm or leg.  Take off your gloves. Put them in the trash  bag with the old dressing. Tie the bag shut and throw it away.  Wash your hands with soap and water. Dry your hands with a clean towel. If you cannot use soap and water, use hand sanitizer. Get help if:   You have new pain.  You have irritation, a rash, or itching around the wound or dressing.  Changing your dressing is painful.  Changing your dressing causes a lot of bleeding. Get help right away if:  You have very bad pain.  You have signs of infection, such as: ? More redness, swelling, or pain. ? More fluid or blood. ? Warmth. ? Pus or a bad smell. ? Red streaks leading from wound. ? A fever. This information is not intended to replace advice given to you by your health care provider. Make sure you discuss any questions you have with your health care provider. Document Released: 04/26/2008 Document Revised: 07/06/2015 Document Reviewed: 11/03/2014 Elsevier Interactive Patient Education  [employer].

## 2016-07-19 NOTE — Telephone Encounter (Signed)
Patient called and wanted to have her incisions check. Appt made and patient aware

## 2016-07-19 NOTE — Progress Notes (Signed)
Follow Up Note: Gyn-Onc  Janet [name] 46 y.o. female  CC:  Chief Complaint  Patient presents with  . Endometrial Hyperplasia, complex    HPI:  Janet [name] is a 46 year old female initially referred by Dr. 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  She was being evaluated for irregular menses for one month and presented to Dr. [name] for evaluation.  Janet [name] was unable to tolerate an office examination and thus she underwent hysteroscopy D&C on 06/11/2016.  Pathology resulted complex atypical hyperplasia with a few small foci suspicious for early/microscopic adenocarcinoma. She presented to the emergency room on May 6 and was diagnosed with endometritis. She's currently receiving course of Flagyl which will be completed on 06/23/2016.  On 07/09/16, she underwent a robotic-assisted laparoscopic total hysterectomy for uterus greater than 250gms with bilateral salpingoophorectomy, lysis of adhesions with Dr. Everitt Amber.  Post-operative course was uneventful.  Final pathology revealed: Diagnosis Uterus +/- tubes/ovaries, neoplastic, cervix - ENDOMETRIOID ADENOCARCINOMA, 2.4 CM. - NO MYOMETRIAL INVASION IDENTIFIED. - CERVIX, BILATERAL FALLOPIAN TUBES AND BILATERAL OVARIES FREE OF TUMOR. - SEE ONCOLOGY TABLE AND COMMENT. Microscopic Comment ONCOLOGY TABLE-UTERUS, CARCINOMA OR CARCINOSARCOMA Specimen: Uterus with tubes and ovaries and cervix. Procedure: Hysterectomy. Lymph node sampling performed: No. Specimen integrity: Intact. Maximum tumor size: 2.4 cm Histologic type: Endometrioid adenocarcinoma with corded and hyalinized pattern, see comment. Grade: 2 of 3 Myometrial invasion: 0 cm where myometrium is up to 3 cm in thickness Cervical stromal involvement: No. Extent of involvement of other organs: None detected. Lymph - vascular invasion: No. Peritoneal washings: N/A Lymph nodes: Examined: 0 Sentinel 0  Non-sentinel 0 Total Lymph nodes with metastasis: N/A Isolated tumor cells (< 0.2 mm): N/A Micrometastasis: (> 0.2 mm and < 2.0 mm): N/A Macrometastasis: (> 2.0 mm): N/A Extracapsular extension: N/A Pelvic lymph nodes: N/A involved of N/A lymph nodes. Para-aortic lymph nodes: N/A involved of N/A lymph nodes. 1 of 3 [name] [name], Janet [name] [phone]) Microscopic Comment(continued) Other (specify involvement and site): N/A TNM code: pT1a, pNX FIGO Stage (based on pathologic findings, needs clinical correlation): IA COMMENT: This case is sent to Dr. Annitta Jersey at [facility] and his diagnosis is endometrioid adenocarcinoma, grade 2 out of 3 with so-called corded and hyalinized pattern. He states that the tumor is not invasive of the myometrium only involving to a limited degree adenomyosis. He also states that this pattern is significant as it is often misdiagnosed as a mixed malignant mullerian tumor. The [place] path number is [phone]. (JDP:gt, 07/19/16)  Interval History:  She presents today alone for incision assessment after complaints of drainage, redness, and firmness around the incision.  She states two days ago, she noticed the glue on the incision came off and clear, yellow drainage began to leak out.  She states the drainage turned to pus-like.  She reports having low grade temps at home but none this am.  Keflex was initiated per Dr. Denman George on 07/17/16.  She has taken two days of Keflex and cannot tell a difference in her incision.  She reports having a staph infection in the past with a C section incision that she had a wound vac for.  No other concerns voiced.  Review of Systems  Constitutional: Feels well.  Low grade temperatures at home.  Denies chills, early satiety, change in appetite. Cardiovascular: No chest pain, shortness of breath, or edema.  Pulmonary: No cough or wheeze.  Gastrointestinal: No nausea, vomiting, or  diarrhea. No  bright red blood per rectum or change in bowel movement.  Genitourinary: No frequency, urgency, or dysuria. No vaginal bleeding or discharge.  Musculoskeletal: No myalgia or joint pain. Neurologic: No weakness, numbness, or change in gait.  Psychology: No depression, anxiety, or insomnia.  Current Meds:  Outpatient Encounter Prescriptions as of 07/19/2016  Medication Sig  . albuterol (PROVENTIL HFA;VENTOLIN HFA) 108 (90 Base) MCG/ACT inhaler Inhale 1-2 puffs into the lungs every 6 (six) hours as needed for wheezing or shortness of breath.  Marland Kitchen albuterol (PROVENTIL) (2.5 MG/3ML) 0.083% nebulizer solution Take 3 mLs (2.5 mg total) by nebulization every 6 (six) hours as needed for wheezing or shortness of breath.  Marland Kitchen amoxicillin-clavulanate (AUGMENTIN) 875-125 MG tablet Take 1 tablet by mouth 2 (two) times daily. (Patient not taking: Reported on 07/02/2016)  . cephALEXin (KEFLEX) 500 MG capsule Take 1 capsule (500 mg total) by mouth 4 (four) times daily.  . cetirizine (ZYRTEC) 10 MG tablet Take 10 mg by mouth daily.  . citalopram (CELEXA) 40 MG tablet Take 1 tablet (40 mg total) by mouth daily. (Patient taking differently: Take 40 mg by mouth at bedtime. )  . diclofenac (VOLTAREN) 50 MG EC tablet Take 1 tablet (50 mg total) by mouth 3 (three) times daily.  . ferrous gluconate (FERGON) 324 MG tablet Take 1 tablet (324 mg total) by mouth daily with breakfast.  . Fluticasone-Salmeterol (ADVAIR DISKUS) 250-50 MCG/DOSE AEPB Inhale 1 puff into the lungs 2 (two) times daily.  Marland Kitchen gabapentin (NEURONTIN) 300 MG capsule Take 1 capsule (300 mg total) by mouth 2 (two) times daily.  Marland Kitchen ibuprofen (ADVIL,MOTRIN) 600 MG tablet Take 1 tablet (600 mg total) by mouth every 6 (six) hours as needed.  Marland Kitchen levothyroxine (SYNTHROID, LEVOTHROID) 100 MCG tablet TAKE 2 TABLETS BY MOUTH DAILY BEFORE BREAKFAST.  . meloxicam (MOBIC) 7.5 MG tablet Take 1 tablet (7.5 mg total) by mouth daily.  . montelukast (SINGULAIR) 10 MG tablet Take 1  tablet (10 mg total) by mouth at bedtime.  Marland Kitchen oxyCODONE-acetaminophen (PERCOCET/ROXICET) 5-325 MG tablet Take 1-2 tablets by mouth every 4 (four) hours as needed (moderate to severe pain (when tolerating fluids)).  Marland Kitchen senna (SENOKOT) 8.6 MG TABS tablet Take 1 tablet (8.6 mg total) by mouth at bedtime.   No facility-administered encounter medications on file as of 07/19/2016.     Allergy:  Allergies  Allergen Reactions  . Ciprofloxacin Shortness Of Breath  . Shrimp [Shellfish Allergy]     Social Hx:   Social History   Social History  . Marital status: Widowed    Spouse name: N/A  . Number of children: N/A  . Years of education: N/A   Occupational History  . Not on file.   Social History Main Topics  . Smoking status: Never Smoker  . Smokeless tobacco: Never Used  . Alcohol use No  . Drug use: No  . Sexual activity: Not Currently    Birth control/ protection: None   Other Topics Concern  . Not on file   Social History Narrative  . No narrative on file    Past Surgical Hx:  Past Surgical History:  Procedure Laterality Date  . CARPAL TUNNEL RELEASE    . CESAREAN SECTION    . CHOLECYSTECTOMY    . cyst removal     uterus  . DILATION AND CURETTAGE OF UTERUS     still birth  . DILATION AND CURETTAGE OF UTERUS N/A 06/11/2016   Procedure: DILATATION AND CURETTAGE;  Surgeon:  Emily Filbert, MD;  Location: [facility];  Service: Gynecology;  Laterality: N/A;  . ROBOTIC ASSISTED TOTAL HYSTERECTOMY WITH BILATERAL SALPINGO OOPHERECTOMY Bilateral 07/09/2016   Procedure: XI ROBOTIC ASSISTED TOTAL HYSTERECTOMY WITH BILATERAL SALPINGO OOPHORECTOMY FOR UTERUS GREATER THAN 250 GRAMS, LYSIS OF ADHESIONS;  Surgeon: Everitt Amber, MD;  Location: [facility];  Service: Gynecology;  Laterality: Bilateral;  . SINUS EXPLORATION    . WISDOM TOOTH EXTRACTION      Past Medical Hx:  Past Medical History:  Diagnosis Date  . Anemia   . Asthma   . Depression   . Hypothyroidism   . Pre-diabetes   . Sleep  apnea   . Thyroid disease     Family Hx: History reviewed. No pertinent family history.  Vitals:  Blood pressure (!) 125/48, pulse 78, temperature 99.6 F (37.6 C), temperature source Oral, resp. rate 20, weight (!) 360 lb (163.3 kg).  Physical Exam:  General: Well developed, well nourished female in no acute distress. Alert and oriented x 3.  Cardiovascular: Regular rate and rhythm. S1 and S2 normal.  Lungs: Clear to auscultation bilaterally. No wheezes/crackles/rhonchi noted.  Skin: No rashes or lesions present. Back: No CVA tenderness.  Abdomen: Abdomen soft, tender around the mini-lap incision above the umbilicus and morbidly obese. Active bowel sounds in all quadrants.  Incision red and indurated.  Induration noted 3 cm above incision and 1.5 cm below it.  Wound opened after light probbing with a sterile swab.  Three areas of tunneling noted.  15 cc of serosanguinous drainage expelled from the left aspect of the incision after light palpation of the indurated area.  No odor noted.  Open incision measuring 2 cm in depth with tunneling on the left aspect measuring 3 cm and in the middle of the incision measuring 6 cm.  Wound packed with sterile packing strip.  4x4 dry gauze and abd pad placed on top.      Extremities: No bilateral cyanosis, edema, or clubbing.   Assessment/Plan:  46 year old female s/p robotic assisted total hysterectomy, bilateral salpingo-oophorectomy, lysis of adhesions with Dr. Everitt Amber for endometrial cancer.  Patient advised to pack open incision twice daily for post-operative infection.  Advised to continue taking antibiotics.  Culture obtained.  Patient is going to the beach this weekend and will have her sister who is an Therapist, sports assist with dressing changes.  Reportable signs and symptoms reviewed.  She is to follow up on Monday and advised to call over the weekend for any needs.  Supplies given.  Will evaluate wound again on Monday for consideration of negative pressure  wound therapy if needed per Dr. Denman George.     Janet Ephraim DEAL, NP 07/19/2016, 12:32 PM

## 2016-07-22 ENCOUNTER — Encounter: Payer: Self-pay | Admitting: Gynecologic Oncology

## 2016-07-22 ENCOUNTER — Ambulatory Visit: Payer: Self-pay | Attending: Gynecologic Oncology | Admitting: Gynecologic Oncology

## 2016-07-22 VITALS — BP 137/59 | HR 75 | Temp 98.4°F | Resp 20 | Wt 360.0 lb

## 2016-07-22 DIAGNOSIS — Z91013 Allergy to seafood: Secondary | ICD-10-CM | POA: Insufficient documentation

## 2016-07-22 DIAGNOSIS — T8149XA Infection following a procedure, other surgical site, initial encounter: Secondary | ICD-10-CM | POA: Insufficient documentation

## 2016-07-22 DIAGNOSIS — B965 Pseudomonas (aeruginosa) (mallei) (pseudomallei) as the cause of diseases classified elsewhere: Secondary | ICD-10-CM

## 2016-07-22 DIAGNOSIS — T8140XA Infection following a procedure, unspecified, initial encounter: Secondary | ICD-10-CM

## 2016-07-22 DIAGNOSIS — C541 Malignant neoplasm of endometrium: Secondary | ICD-10-CM | POA: Insufficient documentation

## 2016-07-22 DIAGNOSIS — Z79899 Other long term (current) drug therapy: Secondary | ICD-10-CM | POA: Insufficient documentation

## 2016-07-22 DIAGNOSIS — Z4800 Encounter for change or removal of nonsurgical wound dressing: Secondary | ICD-10-CM | POA: Insufficient documentation

## 2016-07-22 DIAGNOSIS — R7303 Prediabetes: Secondary | ICD-10-CM | POA: Insufficient documentation

## 2016-07-22 DIAGNOSIS — G4730 Sleep apnea, unspecified: Secondary | ICD-10-CM | POA: Insufficient documentation

## 2016-07-22 DIAGNOSIS — F329 Major depressive disorder, single episode, unspecified: Secondary | ICD-10-CM | POA: Insufficient documentation

## 2016-07-22 DIAGNOSIS — J45909 Unspecified asthma, uncomplicated: Secondary | ICD-10-CM | POA: Insufficient documentation

## 2016-07-22 DIAGNOSIS — E039 Hypothyroidism, unspecified: Secondary | ICD-10-CM | POA: Insufficient documentation

## 2016-07-22 DIAGNOSIS — Z90710 Acquired absence of both cervix and uterus: Secondary | ICD-10-CM

## 2016-07-22 LAB — WOUND CULTURE: Organism ID, Bacteria: NONE SEEN

## 2016-07-22 NOTE — Patient Instructions (Signed)
We will call you once we have spoken with [place].  Plan to complete antibiotics and follow up as scheduled.  Please call for any needs.

## 2016-07-22 NOTE — Progress Notes (Signed)
Follow Up Note: Gyn-Onc  Janet [name] [name] 46 y.o. female  CC:  Chief Complaint  Patient presents with  . Wound Check    Follow up    HPI:  Janet [name] [name] is a 46 year old female initially referred by Dr. 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  She was being evaluated for irregular menses for one month and presented to Dr. [name] for evaluation.  Janet [name] [name] was unable to tolerate an office examination and thus she underwent hysteroscopy D&C on 06/11/2016.  Pathology resulted complex atypical hyperplasia with a few small foci suspicious for early/microscopic adenocarcinoma. She presented to the emergency room on May 6 and was diagnosed with endometritis. She's currently receiving course of Flagyl which will be completed on 06/23/2016.  On 07/09/16, she underwent a robotic-assisted laparoscopic total hysterectomy for uterus greater than 250gms with bilateral salpingoophorectomy, lysis of adhesions with Dr. Everitt Amber.  Post-operative course was uneventful.  Final pathology revealed: Diagnosis Uterus +/- tubes/ovaries, neoplastic, cervix - ENDOMETRIOID ADENOCARCINOMA, 2.4 CM. - NO MYOMETRIAL INVASION IDENTIFIED. - CERVIX, BILATERAL FALLOPIAN TUBES AND BILATERAL OVARIES FREE OF TUMOR. - SEE ONCOLOGY TABLE AND COMMENT. Microscopic Comment ONCOLOGY TABLE-UTERUS, CARCINOMA OR CARCINOSARCOMA Specimen: Uterus with tubes and ovaries and cervix. Procedure: Hysterectomy. Lymph node sampling performed: No. Specimen integrity: Intact. Maximum tumor size: 2.4 cm Histologic type: Endometrioid adenocarcinoma with corded and hyalinized pattern, see comment. Grade: 2 of 3 Myometrial invasion: 0 cm where myometrium is up to 3 cm in thickness Cervical stromal involvement: No. Extent of involvement of other organs: None detected. Lymph - vascular invasion: No. Peritoneal washings: N/A Lymph nodes: Examined: 0 Sentinel 0 Non-sentinel 0  Total Lymph nodes with metastasis: N/A Isolated tumor cells (< 0.2 mm): N/A Micrometastasis: (> 0.2 mm and < 2.0 mm): N/A Macrometastasis: (> 2.0 mm): N/A Extracapsular extension: N/A Pelvic lymph nodes: N/A involved of N/A lymph nodes. Para-aortic lymph nodes: N/A involved of N/A lymph nodes. 1 of 3 [name] [name], [name] [phone]) Microscopic Comment(continued) Other (specify involvement and site): N/A TNM code: pT1a, pNX FIGO Stage (based on pathologic findings, needs clinical correlation): IA COMMENT: This case is sent to Dr. 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 He states that the tumor is not invasive of the myometrium only involving to a limited degree adenomyosis. He also states that this pattern is significant as it is often misdiagnosed as a mixed malignant mullerian tumor. The [place] [address] path number is [phone]. (JDP:gt, 07/19/16)  Interval History:  She presents today with her son for incision assessment after her incision was opened superfically on Friday, June 8.  She reports doing well over the weekend and has had a nurse friend change her dressing.  Intermittent pain in her abdomen.  Bowels and bladder functioning without difficulty.  Low grade fever over the weekend but none this am.  She continued her antibiotic and has two doses left. She did not go to the beach over the weekend.  No other concerns voiced.  Review of Systems  Constitutional: Feels well.  Low grade temperature reported last pm at home.  Denies chills, early satiety, change in appetite. Cardiovascular: No chest pain, shortness of breath, or edema.  Pulmonary: No cough or wheeze.  Gastrointestinal: No nausea, vomiting, or diarrhea. No bright red blood per rectum or change in bowel movement.  Genitourinary: No frequency, urgency, or dysuria. No vaginal  bleeding or  discharge.  Musculoskeletal: No myalgia or joint pain. Neurologic: No weakness, numbness, or change in gait.  Psychology: No depression, anxiety, or insomnia.  Current Meds:  Outpatient Encounter Prescriptions as of 07/22/2016  Medication Sig  . albuterol (PROVENTIL HFA;VENTOLIN HFA) 108 (90 Base) MCG/ACT inhaler Inhale 1-2 puffs into the lungs every 6 (six) hours as needed for wheezing or shortness of breath.  Marland Kitchen albuterol (PROVENTIL) (2.5 MG/3ML) 0.083% nebulizer solution Take 3 mLs (2.5 mg total) by nebulization every 6 (six) hours as needed for wheezing or shortness of breath.  Marland Kitchen amoxicillin-clavulanate (AUGMENTIN) 875-125 MG tablet Take 1 tablet by mouth 2 (two) times daily.  . cephALEXin (KEFLEX) 500 MG capsule Take 1 capsule (500 mg total) by mouth 4 (four) times daily.  . cetirizine (ZYRTEC) 10 MG tablet Take 10 mg by mouth daily.  . citalopram (CELEXA) 40 MG tablet Take 1 tablet (40 mg total) by mouth daily. (Patient taking differently: Take 40 mg by mouth at bedtime. )  . diclofenac (VOLTAREN) 50 MG EC tablet Take 1 tablet (50 mg total) by mouth 3 (three) times daily.  . ferrous gluconate (FERGON) 324 MG tablet Take 1 tablet (324 mg total) by mouth daily with breakfast.  . Fluticasone-Salmeterol (ADVAIR DISKUS) 250-50 MCG/DOSE AEPB Inhale 1 puff into the lungs 2 (two) times daily.  Marland Kitchen gabapentin (NEURONTIN) 300 MG capsule Take 1 capsule (300 mg total) by mouth 2 (two) times daily.  Marland Kitchen ibuprofen (ADVIL,MOTRIN) 600 MG tablet Take 1 tablet (600 mg total) by mouth every 6 (six) hours as needed.  Marland Kitchen levothyroxine (SYNTHROID, LEVOTHROID) 100 MCG tablet TAKE 2 TABLETS BY MOUTH DAILY BEFORE BREAKFAST.  . meloxicam (MOBIC) 7.5 MG tablet Take 1 tablet (7.5 mg total) by mouth daily.  . montelukast (SINGULAIR) 10 MG tablet Take 1 tablet (10 mg total) by mouth at bedtime.  Marland Kitchen oxyCODONE-acetaminophen (PERCOCET/ROXICET) 5-325 MG tablet Take 1-2 tablets by mouth every 4 (four) hours as needed (moderate to  severe pain (when tolerating fluids)).  Marland Kitchen senna (SENOKOT) 8.6 MG TABS tablet Take 1 tablet (8.6 mg total) by mouth at bedtime.   No facility-administered encounter medications on file as of 07/22/2016.     Allergy:  Allergies  Allergen Reactions  . Ciprofloxacin Shortness Of Breath  . Shrimp [Shellfish Allergy]     Social Hx:   Social History   Social History  . Marital status: Widowed    Spouse name: N/A  . Number of children: N/A  . Years of education: N/A   Occupational History  . Not on file.   Social History Main Topics  . Smoking status: Never Smoker  . Smokeless tobacco: Never Used  . Alcohol use No  . Drug use: No  . Sexual activity: Not Currently    Birth control/ protection: None   Other Topics Concern  . Not on file   Social History Narrative  . No narrative on file    Past Surgical Hx:  Past Surgical History:  Procedure Laterality Date  . CARPAL TUNNEL RELEASE    . CESAREAN SECTION    . CHOLECYSTECTOMY    . cyst removal     uterus  . DILATION AND CURETTAGE OF UTERUS     still birth  . DILATION AND CURETTAGE OF UTERUS N/A 06/11/2016   Procedure: DILATATION AND CURETTAGE;  Surgeon: Emily Filbert, MD;  Location: [facility];  Service: Gynecology;  Laterality: N/A;  . ROBOTIC ASSISTED TOTAL HYSTERECTOMY WITH BILATERAL SALPINGO OOPHERECTOMY Bilateral 07/09/2016  Procedure: XI ROBOTIC ASSISTED TOTAL HYSTERECTOMY WITH BILATERAL SALPINGO OOPHORECTOMY FOR UTERUS GREATER THAN 250 GRAMS, LYSIS OF ADHESIONS;  Surgeon: Everitt Amber, MD;  Location: [facility];  Service: Gynecology;  Laterality: Bilateral;  . SINUS EXPLORATION    . WISDOM TOOTH EXTRACTION      Past Medical Hx:  Past Medical History:  Diagnosis Date  . Anemia   . Asthma   . Depression   . Hypothyroidism   . Pre-diabetes   . Sleep apnea   . Thyroid disease     Family Hx: History reviewed. No pertinent family history.  Vitals:  Blood pressure (!) 137/59, pulse 75, temperature 98.4 F (36.9 C),  temperature source Oral, resp. rate 20, weight (!) 360 lb (163.3 kg).  Wound culture resulting: Pseudomonas aeruginosa   Physical Exam:  General: Well developed, well nourished female in no acute distress. Alert and oriented x 3.  Cardiovascular: Regular rate and rhythm. S1 and S2 normal.  Lungs: Clear to auscultation bilaterally. No wheezes/crackles/rhonchi noted.  Skin: No rashes or lesions present. Back: No CVA tenderness.  Abdomen: Abdomen soft, slightly tender around the incision, morbidly obese.  Induration noted at previous appt has resolved.  Wound appears smaller in size.  Wound bed moist and red with no signs of infection at this time.  Mild erythema noted around the incision itself but decreased since previous assessment.  Dr. Denman George in the room to assess incision.  Open incision measuring 6.5 cm length x 1.5 cm width x 1.5 cm depth.  No undermining present.  Wound repacked.        Extremities: No bilateral cyanosis, edema, or clubbing.   Assessment/Plan:  46 year old female s/p robotic assisted total hysterectomy, bilateral salpingo-oophorectomy, lysis of adhesions with Dr. Everitt Amber for endometrial cancer.  No additional treatment recommended per Dr. Denman George.  We will move forward with obtaining negative pressure wound therapy per Dr. Denman George.  [facility] is already aware.  Advised to continue taking antibiotics until she has completed the full course.  Reportable signs and symptoms reviewed.  She is to follow up on June 25 or sooner if needed and advised to call over the weekend for any needs.  Dr. Denman George saw the patient, assessed her wound, and agrees with the above plan.  Vivaan Helseth DEAL, NP 07/22/2016, 11:24 AM

## 2016-07-23 ENCOUNTER — Encounter: Payer: Self-pay | Admitting: Gynecologic Oncology

## 2016-07-23 NOTE — Progress Notes (Signed)
Patient presented to the office without an appointment after speaking with [facility] to have her negative pressure wound therapy dressing placed.  [facility] stated they do not service [place] at this time and [facility] is planning on taking over her care but they cannot come out until Wednesday so they told her to come to the hospital to have the dressing placed.    After assessing the wound and discussing the situation with the patient, she would like to hold off on the wound vac at this time and continue with wet to dry dressing changes.  The wound appears smaller than yesterday assessment but measurements remain the same.  Wound clean, moist, no signs of infection.  [facility] rep, Jermaine, notified that the patient has decided against negative pressure therapy at this time.  He is to come and pick up the equipment on Thurs.  Patient advised to call for any changes with her incision.  She is to follow up as scheduled or sooner if needed.

## 2016-07-24 ENCOUNTER — Encounter (HOSPITAL_COMMUNITY): Payer: Self-pay

## 2016-07-29 ENCOUNTER — Encounter: Payer: Self-pay | Admitting: Gynecologic Oncology

## 2016-08-05 ENCOUNTER — Encounter: Payer: Self-pay | Admitting: Gynecologic Oncology

## 2016-08-05 ENCOUNTER — Ambulatory Visit: Payer: Self-pay | Attending: Gynecologic Oncology | Admitting: Gynecologic Oncology

## 2016-08-05 VITALS — BP 134/49 | HR 75 | Temp 98.9°F | Resp 20 | Wt 357.0 lb

## 2016-08-05 DIAGNOSIS — C541 Malignant neoplasm of endometrium: Secondary | ICD-10-CM

## 2016-08-05 DIAGNOSIS — R7303 Prediabetes: Secondary | ICD-10-CM | POA: Insufficient documentation

## 2016-08-05 DIAGNOSIS — IMO0001 Reserved for inherently not codable concepts without codable children: Secondary | ICD-10-CM

## 2016-08-05 DIAGNOSIS — T814XXD Infection following a procedure, subsequent encounter: Secondary | ICD-10-CM

## 2016-08-05 DIAGNOSIS — Z7189 Other specified counseling: Secondary | ICD-10-CM

## 2016-08-05 DIAGNOSIS — G4730 Sleep apnea, unspecified: Secondary | ICD-10-CM | POA: Insufficient documentation

## 2016-08-05 NOTE — Patient Instructions (Signed)
Plan to follow up in six months or sooner if needed.  Please call for any questions or concerns. 

## 2016-08-05 NOTE — Progress Notes (Signed)
Consult Note: Gyn-Onc  Consult was requested by Dr. [name] for the evaluation of Janet [name] 46 y.o. female  CC:  Chief Complaint  Patient presents with  . Endometrial Hyperplasia, Complex    Assessment/Plan:  [name] is a 46 y.o.  with stage IA grade 2 endometrial cancer s/p robotic hysterectomy, BSO on 7/40/81 complicated by post op wound separation.  1/ Endometrial cancer: Pathology revealed low risk factors for recurrence, therefore no adjuvant therapy is recommended according to NCCN guidelines.  I discussed risk for recurrence and typical symptoms encouraged her to notify us of these should they develop between visits.  I recommend she have follow-up every 6 months for 5 years in accordance with NCCN guidelines. Those visits should include symptom assessment, physical exam and pelvic examination. Pap smears are not indicated or recommended in the routine surveillance of endometrial cancer.  2/ wound separation: Healing well. No additional packing needed   HPI: [name] is a 46 y.o.  G2P1 with history of irregular menses.  Janet [name] presented To Dr. [name] in  with a report of bleeding daily for almost a month. Janet [name] was unable to tolerate an office examination and thus she underwent hysteroscopy D&C on 06/11/2016.  Pathology was notable for complex atypical hyperplasia with a few small foci suspicious for early/microscopic adenocarcinoma. She presented to the emergency room on May 6 and was diagnosed with endometritis. She's currently receiving course of Flagyl which will be completed on 06/23/2016.  There is no family history of gynecologic GI or breast malignancies.    Interval Hx:  On 07/09/16 she underwent robotic assisted total hysterectomy, BSO for a uterus >250 (336)gm with minilaparotomy for specimen delivery.  Final pathology revealed a 2.4cm grade 2 endometrioid tumor with no myometrial invasion or LVSI. Lymphadenectomy  was not performed due to extreme abdominal adiposity (BMI 56) and preoperative preinvasive diagnosis. She was determined to have low risk features in her pathology and therefore no adjuvant therapy was recommended in accordance with NCCN guidelines.  Postop week 1 her minilaparotomy wound opened (no cellulitis). It has been managed with wet-to-dry dressings and has substantially closed.  Review of Systems:  Constitutional  Feels well,   Cardiovascular  No chest pain, shortness of breath, or edema  Pulmonary  No cough or wheeze.  Gastro Intestinal  No nausea, vomitting, or diarrhoea. No bright red blood per rectum, no abdominal pain, no change in bowel movement, or constipation.  Genito Urinary  No frequency, urgency, dysuria, vaginal spotting Musculo Skeletal  No myalgia, arthralgia, joint swelling or pain  Neurologic  No weakness, numbness, change in gait,  Psychology  No depression, anxiety from diagnoses and social situation  Current Meds:  Outpatient Encounter Prescriptions as of 08/05/2016  Medication Sig  . albuterol (PROVENTIL HFA;VENTOLIN HFA) 108 (90 Base) MCG/ACT inhaler Inhale 1-2 puffs into the lungs every 6 (six) hours as needed for wheezing or shortness of breath.  Marland Kitchen albuterol (PROVENTIL) (2.5 MG/3ML) 0.083% nebulizer solution Take 3 mLs (2.5 mg total) by nebulization every 6 (six) hours as needed for wheezing or shortness of breath.  Marland Kitchen amoxicillin-clavulanate (AUGMENTIN) 875-125 MG tablet Take 1 tablet by mouth 2 (two) times daily.  . cephALEXin (KEFLEX) 500 MG capsule Take 1 capsule (500 mg total) by mouth 4 (four) times daily.  . cetirizine (ZYRTEC) 10 MG tablet Take 10 mg by mouth daily.  . citalopram (CELEXA) 40 MG tablet Take 1 tablet (40 mg total) by mouth daily. (Patient taking differently: Take  40 mg by mouth at bedtime. )  . diclofenac (VOLTAREN) 50 MG EC tablet Take 1 tablet (50 mg total) by mouth 3 (three) times daily.  . ferrous gluconate (FERGON) 324 MG  tablet Take 1 tablet (324 mg total) by mouth daily with breakfast.  . Fluticasone-Salmeterol (ADVAIR DISKUS) 250-50 MCG/DOSE AEPB Inhale 1 puff into the lungs 2 (two) times daily.  Marland Kitchen gabapentin (NEURONTIN) 300 MG capsule Take 1 capsule (300 mg total) by mouth 2 (two) times daily.  Marland Kitchen ibuprofen (ADVIL,MOTRIN) 600 MG tablet Take 1 tablet (600 mg total) by mouth every 6 (six) hours as needed.  Marland Kitchen levothyroxine (SYNTHROID, LEVOTHROID) 100 MCG tablet TAKE 2 TABLETS BY MOUTH DAILY BEFORE BREAKFAST.  . meloxicam (MOBIC) 7.5 MG tablet Take 1 tablet (7.5 mg total) by mouth daily.  . montelukast (SINGULAIR) 10 MG tablet Take 1 tablet (10 mg total) by mouth at bedtime.  Marland Kitchen oxyCODONE-acetaminophen (PERCOCET/ROXICET) 5-325 MG tablet Take 1-2 tablets by mouth every 4 (four) hours as needed (moderate to severe pain (when tolerating fluids)).  Marland Kitchen senna (SENOKOT) 8.6 MG TABS tablet Take 1 tablet (8.6 mg total) by mouth at bedtime.   No facility-administered encounter medications on file as of 08/05/2016.     Allergy:  Allergies  Allergen Reactions  . Ciprofloxacin Shortness Of Breath  . Shrimp [Shellfish Allergy]     Social Hx:   Social History   Social History  . Marital status: Widowed    Spouse name: N/A  . Number of children: N/A  . Years of education: N/A   Occupational History  . Not on file.   Social History Main Topics  . Smoking status: Never Smoker  . Smokeless tobacco: Never Used  . Alcohol use No  . Drug use: No  . Sexual activity: Not Currently    Birth control/ protection: None   Other Topics Concern  . Not on file   Social History Narrative  . No narrative on file   Husband was diagnosed with leukemia in January 2018 with a life expectancy of approximately 6 months. At this time he is receiving supportive care and regularly is managed with blood and platelet transfusions.  Regular home nursing visits are in place.   Janet [name] denies history of sexual or physical  abuse.  Past Surgical Hx:  Past Surgical History:  Procedure Laterality Date  . CARPAL TUNNEL RELEASE    . CESAREAN SECTION    . CHOLECYSTECTOMY    . cyst removal     uterus  . DILATION AND CURETTAGE OF UTERUS     still birth  . DILATION AND CURETTAGE OF UTERUS N/A 06/11/2016   Procedure: DILATATION AND CURETTAGE;  Surgeon: Emily Filbert, MD;  Location: [facility];  Service: Gynecology;  Laterality: N/A;  . ROBOTIC ASSISTED TOTAL HYSTERECTOMY WITH BILATERAL SALPINGO OOPHERECTOMY Bilateral 07/09/2016   Procedure: XI ROBOTIC ASSISTED TOTAL HYSTERECTOMY WITH BILATERAL SALPINGO OOPHORECTOMY FOR UTERUS GREATER THAN 250 GRAMS, LYSIS OF ADHESIONS;  Surgeon: Everitt Amber, MD;  Location: [facility];  Service: Gynecology;  Laterality: Bilateral;  . SINUS EXPLORATION    . WISDOM TOOTH EXTRACTION      Past Medical Hx:  Past Medical History:  Diagnosis Date  . Anemia   . Asthma   . Depression   . Hypothyroidism   . Pre-diabetes   . Sleep apnea   . Thyroid disease     Past Gynecological History:  Gravida 2 para 1 menarche at 43 with irregular menses since last Pap 06/11/2016  within normal limits no history of abnormal Pap test reports history of OCP use in her 78s  Family Hx: History reviewed. No pertinent family history.  Vitals:  Blood pressure (!) 134/49, pulse 75, temperature 98.9 F (37.2 C), temperature source Oral, resp. rate 20, weight (!) 357 lb (161.9 kg), SpO2 100 %. Body mass index is 52.72 kg/m. Wt Readings from Last 3 Encounters:  08/05/16 (!) 357 lb (161.9 kg)  07/22/16 (!) 360 lb (163.3 kg)  07/19/16 (!) 360 lb (163.3 kg)    Physical Exam: WD in NAD Neck  Supple NROM, without any enlargements.  Lymph Node Survey No cervical supraclavicular or inguinal adenopathy Cardiovascular  Pulse normal rate, regularity and rhythm. S1 and S2 normal.  Lungs  Clear to auscultation bilaterally, without wheezes/crackles/rhonchi. Good air movement.  Skin  No rash/lesions/breakdown   Psychiatry  Alert and oriented appropriate mood affect speech and reasoning. Abdomen  Normoactive bowel sounds, abdomen soft, non-tender. large pannus, incision mostly closed with clean granulation tissue. Not deep enough for packing. Back No CVA tenderness Genito Urinary  : unable to see suture line as vagina too long.  - no palpable masses or cuff defects. Rectal  deferred  Extremities  No bilateral cyanosis, clubbing or edema.   30 minutes of direct face to face counseling time was spent with the patient. This included discussion about prognosis, therapy recommendations and postoperative side effects and are beyond the scope of routine postoperative care.   [name], MD, PhD 08/05/2016, 1:00 PM

## 2016-08-12 MED FILL — MONTELUKAST SOD 10 MG TAB: 10 | 30 days supply | Qty: 30 | Fill #1

## 2016-08-12 MED FILL — ?LEVOTHYROXINE 100 MCG TAB: 100 | 30 days supply | Qty: 60 | Fill #0

## 2016-08-16 ENCOUNTER — Ambulatory Visit: Payer: Medicaid Other | Attending: Family Medicine | Admitting: Family Medicine

## 2016-08-16 ENCOUNTER — Encounter: Payer: Self-pay | Admitting: Family Medicine

## 2016-08-16 VITALS — BP 129/69 | HR 85 | Temp 98.4°F | Resp 18 | Ht 69.0 in | Wt 363.8 lb

## 2016-08-16 DIAGNOSIS — B3731 Acute candidiasis of vulva and vagina: Secondary | ICD-10-CM

## 2016-08-16 DIAGNOSIS — F329 Major depressive disorder, single episode, unspecified: Secondary | ICD-10-CM | POA: Diagnosis not present

## 2016-08-16 DIAGNOSIS — Z90722 Acquired absence of ovaries, bilateral: Secondary | ICD-10-CM | POA: Insufficient documentation

## 2016-08-16 DIAGNOSIS — N760 Acute vaginitis: Secondary | ICD-10-CM | POA: Diagnosis not present

## 2016-08-16 DIAGNOSIS — Z79899 Other long term (current) drug therapy: Secondary | ICD-10-CM | POA: Insufficient documentation

## 2016-08-16 DIAGNOSIS — Z881 Allergy status to other antibiotic agents status: Secondary | ICD-10-CM | POA: Insufficient documentation

## 2016-08-16 DIAGNOSIS — E1149 Type 2 diabetes mellitus with other diabetic neurological complication: Secondary | ICD-10-CM

## 2016-08-16 DIAGNOSIS — Z91013 Allergy to seafood: Secondary | ICD-10-CM | POA: Diagnosis not present

## 2016-08-16 DIAGNOSIS — N3000 Acute cystitis without hematuria: Secondary | ICD-10-CM

## 2016-08-16 DIAGNOSIS — C541 Malignant neoplasm of endometrium: Secondary | ICD-10-CM | POA: Insufficient documentation

## 2016-08-16 DIAGNOSIS — B373 Candidiasis of vulva and vagina: Secondary | ICD-10-CM | POA: Insufficient documentation

## 2016-08-16 DIAGNOSIS — B9689 Other specified bacterial agents as the cause of diseases classified elsewhere: Secondary | ICD-10-CM | POA: Diagnosis not present

## 2016-08-16 DIAGNOSIS — J4540 Moderate persistent asthma, uncomplicated: Secondary | ICD-10-CM | POA: Insufficient documentation

## 2016-08-16 DIAGNOSIS — E119 Type 2 diabetes mellitus without complications: Secondary | ICD-10-CM | POA: Diagnosis present

## 2016-08-16 DIAGNOSIS — Z7951 Long term (current) use of inhaled steroids: Secondary | ICD-10-CM | POA: Insufficient documentation

## 2016-08-16 DIAGNOSIS — Z6841 Body Mass Index (BMI) 40.0 and over, adult: Secondary | ICD-10-CM | POA: Diagnosis not present

## 2016-08-16 DIAGNOSIS — E1140 Type 2 diabetes mellitus with diabetic neuropathy, unspecified: Secondary | ICD-10-CM | POA: Diagnosis not present

## 2016-08-16 DIAGNOSIS — Z90710 Acquired absence of both cervix and uterus: Secondary | ICD-10-CM | POA: Diagnosis not present

## 2016-08-16 DIAGNOSIS — E039 Hypothyroidism, unspecified: Secondary | ICD-10-CM | POA: Insufficient documentation

## 2016-08-16 DIAGNOSIS — G4730 Sleep apnea, unspecified: Secondary | ICD-10-CM | POA: Insufficient documentation

## 2016-08-16 LAB — GLUCOSE, POCT (MANUAL RESULT ENTRY): POC GLUCOSE: 98 mg/dL (ref 70–99)

## 2016-08-16 LAB — POCT URINALYSIS DIPSTICK
BILIRUBIN UA: NEGATIVE
Glucose, UA: NEGATIVE
Ketones, UA: NEGATIVE
Nitrite, UA: NEGATIVE
PH UA: 6.5 (ref 5.0–8.0)
Protein, UA: NEGATIVE
Spec Grav, UA: 1.015 (ref 1.010–1.025)
Urobilinogen, UA: 0.2 E.U./dL

## 2016-08-16 MED ORDER — LEVOTHYROXINE SODIUM 100 MCG PO TABS: ORAL_TABLET | ORAL | 2 refills | Status: DC

## 2016-08-16 MED ORDER — ALBUTEROL SULFATE HFA 108 (90 BASE) MCG/ACT IN AERS: 1.0000 | INHALATION_SPRAY | Freq: Four times a day (QID) | RESPIRATORY_TRACT | 1 refills | Status: DC | PRN

## 2016-08-16 MED ORDER — SULFAMETHOXAZOLE-TRIMETHOPRIM 800-160 MG PO TABS: 1.0000 | ORAL_TABLET | Freq: Two times a day (BID) | ORAL | 0 refills | Status: DC

## 2016-08-16 MED ORDER — GABAPENTIN 300 MG PO CAPS: 600.0000 mg | ORAL_CAPSULE | Freq: Two times a day (BID) | ORAL | 3 refills | Status: DC

## 2016-08-16 MED ORDER — FLUCONAZOLE 150 MG PO TABS: 150.0000 mg | ORAL_TABLET | Freq: Once | ORAL | 0 refills | Status: AC

## 2016-08-16 MED ORDER — METRONIDAZOLE 0.75 % VA GEL: 1.0000 | Freq: Every day | VAGINAL | 0 refills | Status: DC

## 2016-08-16 MED ORDER — MONTELUKAST SODIUM 10 MG PO TABS: 10.0000 mg | ORAL_TABLET | Freq: Every day | ORAL | 1 refills | Status: DC

## 2016-08-16 MED ORDER — FLUTICASONE-SALMETEROL 250-50 MCG/DOSE IN AEPB: 1.0000 | INHALATION_SPRAY | Freq: Two times a day (BID) | RESPIRATORY_TRACT | 3 refills | Status: DC

## 2016-08-16 MED ORDER — CLOTRIMAZOLE 1 % EX CREA: 1.0000 "application " | TOPICAL_CREAM | Freq: Two times a day (BID) | CUTANEOUS | 0 refills | Status: DC

## 2016-08-16 MED FILL — GABAPENTIN 300 MG CAPSULE: 300 | 30 days supply | Qty: 60 | Fill #1

## 2016-08-16 NOTE — Patient Instructions (Signed)

## 2016-08-16 NOTE — Progress Notes (Signed)
Subjective:  Patient ID: Janet [name], female    DOB: 1970/04/22  Age: 46 y.o. MRN: [id number]  CC: Vaginitis   HPI Janet [name] s a 46 year old female with a history of type 2 diabetes mellitus (A1c 5.9), hypothyroidism, asthma, Seasonal allergies, morbid obesity who presents today for a follow-up visit.  In 06/2016 she underwent robotic-assisted total abdominal hysterectomy and bilateral salpingo-oophorectomy due to endometrial adenocarcinoma. Pathology reported below:  Uterus +/- tubes/ovaries, neoplastic, cervix - ENDOMETRIOID ADENOCARCINOMA, 2.4 CM. - NO MYOMETRIAL INVASION IDENTIFIED. - CERVIX, BILATERAL FALLOPIAN TUBES AND BILATERAL OVARIES FREE OF TUMOR.  Postop follow-up with GYN was on 08/05/16 - no adjuvant therapy recommended due to pathology revealing low risk for recurrence as per GYN.  She has been compliant with her medications and denies any side effects from them. Denies hypoglycemia. She does have diabetic neuropathy and complains of numbness  and pain in right anterior leg which is not controlled on her current dose.She is requesting refills.  She complains of vaginal itching and discharge for the last few days which she would like treated.  Past Medical History:  Diagnosis Date  . Anemia   . Asthma   . Depression   . Hypothyroidism   . Pre-diabetes   . Sleep apnea   . Thyroid disease     Past Surgical History:  Procedure Laterality Date  . CARPAL TUNNEL RELEASE    . CESAREAN SECTION    . CHOLECYSTECTOMY    . cyst removal     uterus  . DILATION AND CURETTAGE OF UTERUS     still birth  . DILATION AND CURETTAGE OF UTERUS N/A 06/11/2016   Procedure: DILATATION AND CURETTAGE;  Surgeon: Emily Filbert, MD;  Location: [facility];  Service: Gynecology;  Laterality: N/A;  . ROBOTIC ASSISTED TOTAL HYSTERECTOMY WITH BILATERAL SALPINGO OOPHERECTOMY Bilateral 07/09/2016   Procedure: XI ROBOTIC ASSISTED TOTAL HYSTERECTOMY WITH BILATERAL SALPINGO OOPHORECTOMY FOR  UTERUS GREATER THAN 250 GRAMS, LYSIS OF ADHESIONS;  Surgeon: Everitt Amber, MD;  Location: [facility];  Service: Gynecology;  Laterality: Bilateral;  . SINUS EXPLORATION    . WISDOM TOOTH EXTRACTION      Allergies  Allergen Reactions  . Ciprofloxacin Shortness Of Breath  . Shrimp [Shellfish Allergy]      Outpatient Medications Prior to Visit  Medication Sig Dispense Refill  . albuterol (PROVENTIL) (2.5 MG/3ML) 0.083% nebulizer solution Take 3 mLs (2.5 mg total) by nebulization every 6 (six) hours as needed for wheezing or shortness of breath. 75 mL 3  . cephALEXin (KEFLEX) 500 MG capsule Take 1 capsule (500 mg total) by mouth 4 (four) times daily. 28 capsule 0  . cetirizine (ZYRTEC) 10 MG tablet Take 10 mg by mouth daily.    . citalopram (CELEXA) 40 MG tablet Take 1 tablet (40 mg total) by mouth daily. (Patient taking differently: Take 40 mg by mouth at bedtime. ) 90 tablet 1  . diclofenac (VOLTAREN) 50 MG EC tablet Take 1 tablet (50 mg total) by mouth 3 (three) times daily. 30 tablet 1  . ferrous gluconate (FERGON) 324 MG tablet Take 1 tablet (324 mg total) by mouth daily with breakfast. 30 tablet 3  . ibuprofen (ADVIL,MOTRIN) 600 MG tablet Take 1 tablet (600 mg total) by mouth every 6 (six) hours as needed. 30 tablet 1  . meloxicam (MOBIC) 7.5 MG tablet Take 1 tablet (7.5 mg total) by mouth daily. 30 tablet 1  . oxyCODONE-acetaminophen (PERCOCET/ROXICET) 5-325 MG tablet Take 1-2 tablets by mouth every 4 (  four) hours as needed (moderate to severe pain (when tolerating fluids)). 30 tablet 0  . senna (SENOKOT) 8.6 MG TABS tablet Take 1 tablet (8.6 mg total) by mouth at bedtime. 120 each 0  . albuterol (PROVENTIL HFA;VENTOLIN HFA) 108 (90 Base) MCG/ACT inhaler Inhale 1-2 puffs into the lungs every 6 (six) hours as needed for wheezing or shortness of breath. 3 Inhaler 1  . amoxicillin-clavulanate (AUGMENTIN) 875-125 MG tablet Take 1 tablet by mouth 2 (two) times daily. 20 tablet 0  .  Fluticasone-Salmeterol (ADVAIR DISKUS) 250-50 MCG/DOSE AEPB Inhale 1 puff into the lungs 2 (two) times daily. 1 each 3  . gabapentin (NEURONTIN) 300 MG capsule Take 1 capsule (300 mg total) by mouth 2 (two) times daily. 180 capsule 1  . levothyroxine (SYNTHROID, LEVOTHROID) 100 MCG tablet TAKE 2 TABLETS BY MOUTH DAILY BEFORE BREAKFAST. 60 tablet 2  . montelukast (SINGULAIR) 10 MG tablet Take 1 tablet (10 mg total) by mouth at bedtime. 90 tablet 1   No facility-administered medications prior to visit.     ROS Review of Systems  Constitutional: Negative for activity change, appetite change and fatigue.  HENT: Negative for congestion, sinus pressure and sore throat.   Eyes: Negative for visual disturbance.  Respiratory: Negative for cough, chest tightness, shortness of breath and wheezing.   Cardiovascular: Negative for chest pain and palpitations.  Gastrointestinal: Negative for abdominal distention, abdominal pain and constipation.  Endocrine: Negative for polydipsia.  Genitourinary: Positive for vaginal discharge. Negative for dysuria and frequency.  Musculoskeletal: Negative for arthralgias and back pain.  Skin: Negative for rash.  Neurological: Negative for tremors, light-headedness and numbness.  Hematological: Does not bruise/bleed easily.  Psychiatric/Behavioral: Negative for agitation and behavioral problems.    Objective:  BP 129/69 (BP Location: Right Arm, Patient Position: Sitting, Cuff Size: Large)   Pulse 85   Temp 98.4 F (36.9 C) (Oral)   Resp 18   Ht 5\' 9"  (1.753 m)   Wt (!) 363 lb 12.8 oz (165 kg)   LMP  (LMP Unknown) Comment: irregular periods previously, bleeding started in january  SpO2 98%   BMI 53.72 kg/m   BP/Weight 08/16/2016 08/05/2016 6/44/0347  Systolic BP 425 956 387  Diastolic BP 69 49 59  Wt. (Lbs) 363.8 357 360  BMI 53.72 52.72 53.16      Physical Exam  Constitutional: She is oriented to person, place, and time. She appears well-developed and  well-nourished.  Cardiovascular: Normal rate, normal heart sounds and intact distal pulses.   No murmur heard. Pulmonary/Chest: Effort normal and breath sounds normal. She has no wheezes. She has no rales. She exhibits no tenderness.  Abdominal: Soft. Bowel sounds are normal. She exhibits no distension and no mass. There is no tenderness.  Horizontal supraumbilical scar from previous surgery which has healed  Musculoskeletal: Normal range of motion.  Neurological: She is alert and oriented to person, place, and time.  Skin: Skin is warm and dry.  Psychiatric: She has a normal mood and affect.     Lab Results  Component Value Date   HGBA1C 5.9 (H) 07/02/2016   Lab Results  Component Value Date   TSH 2.150 05/27/2016     Assessment & Plan:   1. Diabetes mellitus without complication ([place]) Controlled with A1c of 5.9 - Glucose (CBG)  2. Hypothyroidism, unspecified type Controlled - levothyroxine (SYNTHROID, LEVOTHROID) 100 MCG tablet; TAKE 2 TABLETS BY MOUTH DAILY BEFORE BREAKFAST.  Dispense: 60 tablet; Refill: 2  3. Moderate persistent asthma without  complication Stable, no acute exacerbation - Fluticasone-Salmeterol (ADVAIR DISKUS) 250-50 MCG/DOSE AEPB; Inhale 1 puff into the lungs 2 (two) times daily.  Dispense: 1 each; Refill: 3 - albuterol (PROVENTIL HFA;VENTOLIN HFA) 108 (90 Base) MCG/ACT inhaler; Inhale 1-2 puffs into the lungs every 6 (six) hours as needed for wheezing or shortness of breath.  Dispense: 3 Inhaler; Refill: 1  4. Other diabetic neurological complication associated with type 2 diabetes mellitus (H[facility]) Uncontrolled Increase dose of gabapentin - gabapentin (NEURONTIN) 300 MG capsule; Take 2 capsules (600 mg total) by mouth 2 (two) times daily.  Dispense: 120 capsule; Refill: 3  5. Vaginal candidiasis Treated - Urinalysis Dipstick  6. Acute cystitis without hematuria - sulfamethoxazole-trimethoprim (BACTRIM DS) 800-160 MG tablet; Take 1 tablet by mouth 2  (two) times daily.  Dispense: 20 tablet; Refill: 0  7. Bacterial vaginosis Treated  8. Endometrial carcinoma Status post total abdominal hysterectomy and bilateral salpingo-oophorectomy  Meds ordered this encounter  Medications  . metroNIDAZOLE (METROGEL VAGINAL) 0.75 % vaginal gel    Sig: Place 1 Applicatorful vaginally at bedtime.    Dispense:  70 g    Refill:  0  . fluconazole (DIFLUCAN) 150 MG tablet    Sig: Take 1 tablet (150 mg total) by mouth once.    Dispense:  1 tablet    Refill:  0  . clotrimazole (LOTRIMIN) 1 % cream    Sig: Apply 1 application topically 2 (two) times daily.    Dispense:  30 g    Refill:  0  . levothyroxine (SYNTHROID, LEVOTHROID) 100 MCG tablet    Sig: TAKE 2 TABLETS BY MOUTH DAILY BEFORE BREAKFAST.    Dispense:  60 tablet    Refill:  2  . Fluticasone-Salmeterol (ADVAIR DISKUS) 250-50 MCG/DOSE AEPB    Sig: Inhale 1 puff into the lungs 2 (two) times daily.    Dispense:  1 each    Refill:  3  . montelukast (SINGULAIR) 10 MG tablet    Sig: Take 1 tablet (10 mg total) by mouth at bedtime.    Dispense:  90 tablet    Refill:  1  . albuterol (PROVENTIL HFA;VENTOLIN HFA) 108 (90 Base) MCG/ACT inhaler    Sig: Inhale 1-2 puffs into the lungs every 6 (six) hours as needed for wheezing or shortness of breath.    Dispense:  3 Inhaler    Refill:  1  . gabapentin (NEURONTIN) 300 MG capsule    Sig: Take 2 capsules (600 mg total) by mouth 2 (two) times daily.    Dispense:  120 capsule    Refill:  3  . sulfamethoxazole-trimethoprim (BACTRIM DS) 800-160 MG tablet    Sig: Take 1 tablet by mouth 2 (two) times daily.    Dispense:  20 tablet    Refill:  0    Follow-up: Return in about 3 months (around 11/16/2016) for Follow-up on chronic medical conditions.   This note has been created with Surveyor, quantity. Any transcriptional errors are unintentional.     Arnoldo Morale MD

## 2016-08-23 ENCOUNTER — Telehealth: Payer: Self-pay | Admitting: Family Medicine

## 2016-08-23 NOTE — Telephone Encounter (Signed)
Patient advised to take the gabapentin that was prescribed She can also take tylenol 650 mg BID and ibuprofen 600 mg twice daily with food for next 3-5 days to reduce pain

## 2016-08-23 NOTE — Telephone Encounter (Signed)
Pt. Called stating that she went to the ED and was prescribed Lyrica. Pt. States she can not afford the medication and is in pain. Pt. Would like to know if she can take something for the pain on her foot. Please f/u

## 2016-08-26 ENCOUNTER — Telehealth: Payer: Self-pay | Admitting: Family Medicine

## 2016-08-26 NOTE — Telephone Encounter (Signed)
Could you please find out what condition she has with her foot and what she would like the letter to her place of work to state? Thanks

## 2016-08-26 NOTE — Telephone Encounter (Signed)
Please advise on letter for foot and work.

## 2016-08-26 NOTE — Telephone Encounter (Signed)
Pt called need to speak with the PCP, is about her foot and a letter for work, please call her back

## 2016-08-27 ENCOUNTER — Ambulatory Visit: Payer: Medicaid Other | Attending: Family Medicine | Admitting: Family Medicine

## 2016-08-27 VITALS — BP 131/65 | HR 82 | Temp 98.6°F | Resp 20 | Ht 69.0 in | Wt 363.0 lb

## 2016-08-27 DIAGNOSIS — Z90710 Acquired absence of both cervix and uterus: Secondary | ICD-10-CM | POA: Diagnosis not present

## 2016-08-27 DIAGNOSIS — F329 Major depressive disorder, single episode, unspecified: Secondary | ICD-10-CM | POA: Diagnosis not present

## 2016-08-27 DIAGNOSIS — Z8542 Personal history of malignant neoplasm of other parts of uterus: Secondary | ICD-10-CM | POA: Insufficient documentation

## 2016-08-27 DIAGNOSIS — E118 Type 2 diabetes mellitus with unspecified complications: Secondary | ICD-10-CM | POA: Diagnosis not present

## 2016-08-27 DIAGNOSIS — Z79899 Other long term (current) drug therapy: Secondary | ICD-10-CM | POA: Diagnosis not present

## 2016-08-27 DIAGNOSIS — J45909 Unspecified asthma, uncomplicated: Secondary | ICD-10-CM | POA: Diagnosis not present

## 2016-08-27 DIAGNOSIS — Z7951 Long term (current) use of inhaled steroids: Secondary | ICD-10-CM | POA: Insufficient documentation

## 2016-08-27 DIAGNOSIS — Z91013 Allergy to seafood: Secondary | ICD-10-CM | POA: Insufficient documentation

## 2016-08-27 DIAGNOSIS — F330 Major depressive disorder, recurrent, mild: Secondary | ICD-10-CM | POA: Diagnosis not present

## 2016-08-27 DIAGNOSIS — E1149 Type 2 diabetes mellitus with other diabetic neurological complication: Secondary | ICD-10-CM

## 2016-08-27 DIAGNOSIS — Z881 Allergy status to other antibiotic agents status: Secondary | ICD-10-CM | POA: Insufficient documentation

## 2016-08-27 DIAGNOSIS — E1140 Type 2 diabetes mellitus with diabetic neuropathy, unspecified: Secondary | ICD-10-CM | POA: Diagnosis not present

## 2016-08-27 DIAGNOSIS — Z90722 Acquired absence of ovaries, bilateral: Secondary | ICD-10-CM | POA: Diagnosis not present

## 2016-08-27 DIAGNOSIS — Z6841 Body Mass Index (BMI) 40.0 and over, adult: Secondary | ICD-10-CM | POA: Insufficient documentation

## 2016-08-27 DIAGNOSIS — F419 Anxiety disorder, unspecified: Secondary | ICD-10-CM | POA: Insufficient documentation

## 2016-08-27 DIAGNOSIS — E039 Hypothyroidism, unspecified: Secondary | ICD-10-CM | POA: Insufficient documentation

## 2016-08-27 DIAGNOSIS — M79671 Pain in right foot: Secondary | ICD-10-CM | POA: Diagnosis present

## 2016-08-27 LAB — GLUCOSE, POCT (MANUAL RESULT ENTRY): POC Glucose: 220 mg/dl — AB (ref 70–99)

## 2016-08-27 MED ORDER — PREGABALIN 150 MG PO CAPS: 150.0000 mg | ORAL_CAPSULE | Freq: Two times a day (BID) | ORAL | 3 refills | Status: DC

## 2016-08-27 MED ORDER — GABAPENTIN 300 MG PO CAPS: 900.0000 mg | ORAL_CAPSULE | Freq: Two times a day (BID) | ORAL | 3 refills | Status: DC

## 2016-08-27 MED ORDER — DULOXETINE HCL 60 MG PO CPEP: 60.0000 mg | ORAL_CAPSULE | Freq: Every day | ORAL | 3 refills | Status: DC

## 2016-08-27 NOTE — Patient Instructions (Signed)
Diabetic Neuropathy Diabetic neuropathy is a nerve disease or nerve damage that is caused by diabetes mellitus. About half of all people with diabetes mellitus have some form of nerve damage. Nerve damage is more common in those who have had diabetes mellitus for many years and who generally have not had good control of their blood sugar (glucose) level. Diabetic neuropathy is a common complication of diabetes mellitus. There are three common types of diabetic neuropathy and a fourth type that is less common and less understood:  Peripheral neuropathy-This is the most common type of diabetic neuropathy. It causes damage to the nerves of the feet and legs first and then eventually the hands and arms. The damage affects the ability to sense touch.  Autonomic neuropathy-This type causes damage to the autonomic nervous system, which controls the following functions: ? Heartbeat. ? Body temperature. ? Blood pressure. ? Urination. ? Digestion. ? Sweating. ? Sexual function.  Focal neuropathy-Focal neuropathy can be painful and unpredictable and occurs most often in older adults with diabetes mellitus. It involves a specific nerve or one area and often comes on suddenly. It usually does not cause long-term problems.  Radiculoplexus neuropathy- Sometimes called lumbosacral radiculoplexus neuropathy, radiculoplexus neuropathy affects the nerves of the thighs, hips, buttocks, or legs. It is more common in people with type 2 diabetes mellitus and in older men. It is characterized by debilitating pain, weakness, and atrophy, usually in the thigh muscles.  What are the causes? The cause of peripheral, autonomic, and focal neuropathies is diabetes mellitus that is uncontrolled and high glucose levels. The cause of radiculoplexus neuropathy is unknown. However, it is thought to be caused by inflammation related to uncontrolled glucose levels. What are the signs or symptoms? Peripheral Neuropathy Peripheral  neuropathy develops slowly over time. When the nerves of the feet and legs no longer work there may be:  Burning, stabbing, or aching pain in the legs or feet.  Inability to feel pressure or pain in your feet. This can lead to: ? Thick calluses over pressure areas. ? Pressure sores. ? Ulcers.  Foot deformities.  Reduced ability to feel temperature changes.  Muscle weakness.  Autonomic Neuropathy The symptoms of autonomic neuropathy vary depending on which nerves are affected. Symptoms may include:  Problems with digestion, such as: ? Feeling sick to your stomach (nausea). ? Vomiting. ? Bloating. ? Constipation. ? Diarrhea. ? Abdominal pain.  Difficulty with urination. This occurs if you lose your ability to sense when your bladder is full. Problems include: ? Urine leakage (incontinence). ? Inability to empty your bladder completely (retention).  Rapid or irregular heartbeat (palpitations).  Blood pressure drops when you stand up (orthostatic hypotension). When you stand up you may feel: ? Dizzy. ? Weak. ? Faint.  In men, inability to attain and maintain an erection.  In women, vaginal dryness and problems with decreased sexual desire and arousal.  Problems with body temperature regulation.  Increased or decreased sweating.  Focal Neuropathy  Abnormal eye movements or abnormal alignment of both eyes.  Weakness in the wrist.  Foot drop. This results in an inability to lift the foot properly and abnormal walking or foot movement.  Paralysis on one side of your face (Bell palsy).  Chest or abdominal pain. Radiculoplexus Neuropathy  Sudden, severe pain in your hip, thigh, or buttocks.  Weakness and wasting of thigh muscles.  Difficulty rising from a seated position.  Abdominal swelling.  Unexplained weight loss (usually more than 10 lb [4.5 kg]). How is   this diagnosed? Peripheral Neuropathy Your senses may be tested. Sensory function testing can be  done with:  A light touch using a monofilament.  A vibration with tuning fork.  A sharp sensation with a pin prick.  Other tests that can help diagnose neuropathy are:  Nerve conduction velocity. This test checks the transmission of an electrical current through a nerve.  Electromyography. This shows how muscles respond to electrical signals transmitted by nearby nerves.  Quantitative sensory testing. This is used to assess how your nerves respond to vibrations and changes in temperature.  Autonomic Neuropathy Diagnosis is often based on reported symptoms. Tell your health care provider if you experience:  Dizziness.  Constipation.  Diarrhea.  Inappropriate urination or inability to urinate.  Inability to get or maintain an erection.  Tests that may be done include:  Electrocardiography or Holter monitor. These are tests that can help show problems with the heart rate or heart rhythm.  An X-ray exam may be done.  Focal Neuropathy Diagnosis is made based on your symptoms and what your health care provider finds during your exam. Other tests may be done. They may include:  Nerve conduction velocities. This checks the transmission of electrical current through a nerve.  Electromyography. This shows how muscles respond to electrical signals transmitted by nearby nerves.  Quantitative sensory testing. This test is used to assess how your nerves respond to vibration and changes in temperature.  Radiculoplexus Neuropathy  Often the first thing is to eliminate any other issue or problems that might be the cause, as there is no standard test for diagnosis.  X-ray exam of your spine and lumbar region.  Spinal tap to rule out cancer.  MRI to rule out other lesions. How is this treated? Once nerve damage occurs, it cannot be reversed. The goal of treatment is to keep the disease or nerve damage from getting worse and affecting more nerve fibers. Controlling your blood  glucose level is the key. Most people with radiculoplexus neuropathy see at least a partial improvement over time. You will need to keep your blood glucose and HbA1c levels in the target range determined by your health care provider. Things that help control blood glucose levels include:  Blood glucose monitoring.  Meal planning.  Physical activity.  Diabetes medicine.  Over time, maintaining lower blood glucose levels helps lessen symptoms. Sometimes, prescription pain medicine is needed. Follow these instructions at home:  Do not smoke.  Keep your blood glucose level in the range that you and your health care provider have determined acceptable for you.  Keep your blood pressure level in the range that you and your health care provider have determined acceptable for you.  Eat a well-balanced diet.  Be physically active every day. Include strength training and balance exercises.  Protect your feet. ? Check your feet every day for sores, cuts, blisters, or signs of infection. ? Wear padded socks and supportive shoes. Use orthotic inserts, if necessary. ? Regularly check the insides of your shoes for worn spots. Make sure there are no rocks or other items inside your shoes before you put them on. Contact a health care provider if:  You have burning, stabbing, or aching pain in the legs or feet.  You are unable to feel pressure or pain in your feet.  You develop problems with digestion such as: ? Nausea. ? Vomiting. ? Bloating. ? Constipation. ? Diarrhea. ? Abdominal pain.  You have difficulty with urination, such as: ? Incontinence. ? Retention.    You have palpitations.  You develop orthostatic hypotension. When you stand up you may feel: ? Dizzy. ? Weak. ? Faint.  You cannot attain and maintain an erection (in men).  You have vaginal dryness and problems with decreased sexual desire and arousal (in women).  You have severe pain in your thighs, legs, or  buttocks.  You have unexplained weight loss. This information is not intended to replace advice given to you by your health care provider. Make sure you discuss any questions you have with your health care provider. Document Released: 04/08/2001 Document Revised: 07/06/2015 Document Reviewed: 07/09/2012 Elsevier Interactive Patient Education  2017 [employer] Inc[employer]  

## 2016-08-27 NOTE — Progress Notes (Signed)
Subjective:  Patient ID: Janet [name], female    DOB: [birth year]  Age: 46 y.o. MRN: [id number]  CC: Foot Pain (right)   HPI Janet [name]  is a 46 year old female with a history of type 2 diabetes mellitus (A1c 5.9),Diabetic neuropathy, hypothyroidism, asthma, Seasonal allergies, endometrial adenocarcinoma (s/pTotal abdominal hysterectomy and bilateral salpingo-oophorectomy in 06/2016),  morbid obesity who presents today with worsening diabetic neuropathy which is uncontrolled on current dose of gabapentin.  She complains of sharp pains more in her right leg and right feet which prevents her from bearing weight on her legs. She was seen at the ED in [facility] where she received prednisone. She also received a prescription for Lyrica which she was unable to afford. She is requesting a note to excuse from work as she is unable to bear weight at this time.  Diabetes is diet-controlled  Past Medical History:  Diagnosis Date  . Anemia   . Asthma   . Depression   . Hypothyroidism   . Pre-diabetes   . Sleep apnea   . Thyroid disease     Past Surgical History:  Procedure Laterality Date  . CARPAL TUNNEL RELEASE    . CESAREAN SECTION    . CHOLECYSTECTOMY    . cyst removal     uterus  . DILATION AND CURETTAGE OF UTERUS     still birth  . DILATION AND CURETTAGE OF UTERUS N/A 06/11/2016   Procedure: DILATATION AND CURETTAGE;  Surgeon: Emily Filbert, MD;  Location: [facility];  Service: Gynecology;  Laterality: N/A;  . ROBOTIC ASSISTED TOTAL HYSTERECTOMY WITH BILATERAL SALPINGO OOPHERECTOMY Bilateral 07/09/2016   Procedure: XI ROBOTIC ASSISTED TOTAL HYSTERECTOMY WITH BILATERAL SALPINGO OOPHORECTOMY FOR UTERUS GREATER THAN 250 GRAMS, LYSIS OF ADHESIONS;  Surgeon: Everitt Amber, MD;  Location: [facility];  Service: Gynecology;  Laterality: Bilateral;  . SINUS EXPLORATION    . WISDOM TOOTH EXTRACTION      Allergies  Allergen Reactions  . Ciprofloxacin Shortness Of Breath  . Shrimp  [Shellfish Allergy]      Outpatient Medications Prior to Visit  Medication Sig Dispense Refill  . albuterol (PROVENTIL HFA;VENTOLIN HFA) 108 (90 Base) MCG/ACT inhaler Inhale 1-2 puffs into the lungs every 6 (six) hours as needed for wheezing or shortness of breath. 3 Inhaler 1  . albuterol (PROVENTIL) (2.5 MG/3ML) 0.083% nebulizer solution Take 3 mLs (2.5 mg total) by nebulization every 6 (six) hours as needed for wheezing or shortness of breath. 75 mL 3  . cetirizine (ZYRTEC) 10 MG tablet Take 10 mg by mouth daily.    . clotrimazole (LOTRIMIN) 1 % cream Apply 1 application topically 2 (two) times daily. 30 g 0  . ferrous gluconate (FERGON) 324 MG tablet Take 1 tablet (324 mg total) by mouth daily with breakfast. 30 tablet 3  . Fluticasone-Salmeterol (ADVAIR DISKUS) 250-50 MCG/DOSE AEPB Inhale 1 puff into the lungs 2 (two) times daily. 1 each 3  . ibuprofen (ADVIL,MOTRIN) 600 MG tablet Take 1 tablet (600 mg total) by mouth every 6 (six) hours as needed. 30 tablet 1  . levothyroxine (SYNTHROID, LEVOTHROID) 100 MCG tablet TAKE 2 TABLETS BY MOUTH DAILY BEFORE BREAKFAST. 60 tablet 2  . montelukast (SINGULAIR) 10 MG tablet Take 1 tablet (10 mg total) by mouth at bedtime. 90 tablet 1  . oxyCODONE-acetaminophen (PERCOCET/ROXICET) 5-325 MG tablet Take 1-2 tablets by mouth every 4 (four) hours as needed (moderate to severe pain (when tolerating fluids)). 30 tablet 0  . senna (SENOKOT) 8.6 MG TABS  tablet Take 1 tablet (8.6 mg total) by mouth at bedtime. 120 each 0  . sulfamethoxazole-trimethoprim (BACTRIM DS) 800-160 MG tablet Take 1 tablet by mouth 2 (two) times daily. 20 tablet 0  . citalopram (CELEXA) 40 MG tablet Take 1 tablet (40 mg total) by mouth daily. (Patient taking differently: Take 40 mg by mouth at bedtime. ) 90 tablet 1  . gabapentin (NEURONTIN) 300 MG capsule Take 2 capsules (600 mg total) by mouth 2 (two) times daily. 120 capsule 3  . meloxicam (MOBIC) 7.5 MG tablet Take 1 tablet (7.5 mg  total) by mouth daily. (Patient not taking: Reported on 08/27/2016) 30 tablet 1  . cephALEXin (KEFLEX) 500 MG capsule Take 1 capsule (500 mg total) by mouth 4 (four) times daily. 28 capsule 0  . diclofenac (VOLTAREN) 50 MG EC tablet Take 1 tablet (50 mg total) by mouth 3 (three) times daily. 30 tablet 1  . metroNIDAZOLE (METROGEL VAGINAL) 0.75 % vaginal gel Place 1 Applicatorful vaginally at bedtime. 70 g 0   No facility-administered medications prior to visit.     ROS Review of Systems  Constitutional: Negative for activity change, appetite change and fatigue.  HENT: Negative for congestion, sinus pressure and sore throat.   Eyes: Negative for visual disturbance.  Respiratory: Negative for cough, chest tightness, shortness of breath and wheezing.   Cardiovascular: Negative for chest pain and palpitations.  Gastrointestinal: Negative for abdominal distention, abdominal pain and constipation.  Endocrine: Negative for polydipsia.  Genitourinary: Negative.  Negative for dysuria and frequency.  Musculoskeletal: Negative for arthralgias and back pain.       See hpi  Skin: Negative for rash.  Neurological: Positive for numbness. Negative for tremors and light-headedness.  Hematological: Does not bruise/bleed easily.  Psychiatric/Behavioral: Negative for agitation, behavioral problems and dysphoric mood.    Objective:  BP 131/65 (BP Location: Right Arm, Patient Position: Sitting, Cuff Size: Large)   Pulse 82   Temp 98.6 F (37 C) (Oral)   Resp 20   Ht 5\' 9"  (1.753 m)   Wt (!) 363 lb (164.7 kg)   LMP  (LMP Unknown) Comment: irregular periods previously, bleeding started in january  SpO2 97%   BMI 53.61 kg/m   BP/Weight 08/27/2016 08/16/2016 3/97/6734  Systolic BP 193 790 240  Diastolic BP 65 69 49  Wt. (Lbs) 363 363.8 357  BMI 53.61 53.72 52.72      Physical Exam  Constitutional: She is oriented to person, place, and time. She appears well-developed and well-nourished.    Cardiovascular: Normal rate, normal heart sounds and intact distal pulses.   No murmur heard. Pulmonary/Chest: Effort normal and breath sounds normal. She has no wheezes. She has no rales. She exhibits no tenderness.  Abdominal: Soft. Bowel sounds are normal. She exhibits no distension and no mass. There is no tenderness.  Musculoskeletal: Normal range of motion.  Neurological: She is alert and oriented to person, place, and time.  Hyperesthesia in lower half of right leg and right foot    Lab Results  Component Value Date   HGBA1C 5.9 (H) 07/02/2016    Assessment & Plan:   1. Other diabetic neurological complication associated with type 2 diabetes mellitus (HCC) Uncontrolled Increased dose of gabapentin Prescription for Lyrica as per every 10 but this will have to be obtained via patient assistance and patient knows to discontinue gabapentin once Lyrica is obtained. We'll switch from Celexa to Cymbalta due to analgesic effect of the latter. Provided note for work. -  Glucose (CBG) - gabapentin (NEURONTIN) 300 MG capsule; Take 3 capsules (900 mg total) by mouth 2 (two) times daily.  Dispense: 180 capsule; Refill: 3  2. Type 2 diabetes mellitus Diet controlled with A1c of 5.9 Continue lifestyle modifications  3. Anxiety and depression Stable Switched from Celexa to Cymbalta due to analgesic effect of the latter  Meds ordered this encounter  Medications  . gabapentin (NEURONTIN) 300 MG capsule    Sig: Take 3 capsules (900 mg total) by mouth 2 (two) times daily.    Dispense:  180 capsule    Refill:  3    Discontinue previous dose until Lyrica via patient assistance  . pregabalin (LYRICA) 150 MG capsule    Sig: Take 1 capsule (150 mg total) by mouth 2 (two) times daily.    Dispense:  180 capsule    Refill:  3  . DULoxetine (CYMBALTA) 60 MG capsule    Sig: Take 1 capsule (60 mg total) by mouth daily.    Dispense:  30 capsule    Refill:  3    Discontinue Celexa     Follow-up: Return for follow up of chronic medical conditions keep previously scheduled appointment.   This note has been created with Surveyor, quantity. Any transcriptional errors are unintentional.     Arnoldo Morale MD

## 2016-08-28 ENCOUNTER — Encounter: Payer: Self-pay | Admitting: Family Medicine

## 2016-09-06 NOTE — Telephone Encounter (Signed)
Medical Assistant left message on patient's home and cell voicemail. Voicemail states to give a call back to [place] with [facility] at [phone].  !!!Please ask the patient to share the need for the letter in regards to her foot and work!!! Dr Jarold Song needs further explanation!!!

## 2016-09-12 MED FILL — LEVOTHYROXINE 100 MCG TAB: 100 | 30 days supply | Qty: 60 | Fill #1 | Status: TO

## 2016-09-12 MED FILL — MONTELUKAST SOD 10 MG TAB: 10 | 30 days supply | Qty: 30 | Fill #2 | Status: TO

## 2016-09-12 MED FILL — GABAPENTIN 300 MG CAPSULE: 300 | 30 days supply | Qty: 60 | Fill #2

## 2016-09-12 MED FILL — ?DULOXETINE HCL DR 60 MG CA: 60 MG | 30 days supply | Qty: 30 | Fill #0 | Status: TO

## 2016-09-16 ENCOUNTER — Encounter: Payer: Self-pay | Admitting: Family Medicine

## 2016-09-16 ENCOUNTER — Ambulatory Visit: Payer: Medicaid Other | Attending: Family Medicine | Admitting: Family Medicine

## 2016-09-16 VITALS — BP 143/78 | HR 98 | Temp 98.8°F | Resp 18 | Ht 69.0 in | Wt 364.0 lb

## 2016-09-16 DIAGNOSIS — E1149 Type 2 diabetes mellitus with other diabetic neurological complication: Secondary | ICD-10-CM

## 2016-09-16 DIAGNOSIS — R600 Localized edema: Secondary | ICD-10-CM | POA: Diagnosis not present

## 2016-09-16 DIAGNOSIS — G4730 Sleep apnea, unspecified: Secondary | ICD-10-CM | POA: Diagnosis not present

## 2016-09-16 DIAGNOSIS — Z91013 Allergy to seafood: Secondary | ICD-10-CM | POA: Insufficient documentation

## 2016-09-16 DIAGNOSIS — E1140 Type 2 diabetes mellitus with diabetic neuropathy, unspecified: Secondary | ICD-10-CM | POA: Insufficient documentation

## 2016-09-16 DIAGNOSIS — M79604 Pain in right leg: Secondary | ICD-10-CM

## 2016-09-16 DIAGNOSIS — Z881 Allergy status to other antibiotic agents status: Secondary | ICD-10-CM | POA: Insufficient documentation

## 2016-09-16 DIAGNOSIS — Z6841 Body Mass Index (BMI) 40.0 and over, adult: Secondary | ICD-10-CM | POA: Insufficient documentation

## 2016-09-16 DIAGNOSIS — M7989 Other specified soft tissue disorders: Secondary | ICD-10-CM | POA: Diagnosis present

## 2016-09-16 DIAGNOSIS — M79605 Pain in left leg: Secondary | ICD-10-CM

## 2016-09-16 DIAGNOSIS — F329 Major depressive disorder, single episode, unspecified: Secondary | ICD-10-CM | POA: Diagnosis not present

## 2016-09-16 DIAGNOSIS — Z79899 Other long term (current) drug therapy: Secondary | ICD-10-CM | POA: Diagnosis not present

## 2016-09-16 DIAGNOSIS — B351 Tinea unguium: Secondary | ICD-10-CM

## 2016-09-16 DIAGNOSIS — E039 Hypothyroidism, unspecified: Secondary | ICD-10-CM | POA: Insufficient documentation

## 2016-09-16 DIAGNOSIS — Z7951 Long term (current) use of inhaled steroids: Secondary | ICD-10-CM | POA: Insufficient documentation

## 2016-09-16 DIAGNOSIS — J45909 Unspecified asthma, uncomplicated: Secondary | ICD-10-CM | POA: Diagnosis not present

## 2016-09-16 LAB — GLUCOSE, POCT (MANUAL RESULT ENTRY): POC GLUCOSE: 104 mg/dL — AB (ref 70–99)

## 2016-09-16 MED ORDER — TERBINAFINE HCL 250 MG PO TABS
250.0000 mg | ORAL_TABLET | Freq: Every day | ORAL | 1 refills | Status: DC
Start: 2016-09-16 — End: 2016-11-18

## 2016-09-16 MED ORDER — FUROSEMIDE 40 MG PO TABS: 40.0000 mg | ORAL_TABLET | Freq: Every day | ORAL | 3 refills | Status: DC

## 2016-09-16 MED FILL — TERBINAFINE HCL 250 MG TAB: 250 | 30 days supply | Qty: 30 | Fill #0

## 2016-09-16 MED FILL — FUROSEMIDE 40 MG TABLET: 40 | 30 days supply | Qty: 30 | Fill #0

## 2016-09-16 NOTE — Progress Notes (Signed)
Subjective:  Patient ID: Janet [name], female    DOB: Mar 29, 1970  Age: 46 y.o. MRN: [id number]  CC: Swollen feet  HPI Janet [name] is a 46 year old female with a history of type 2 diabetes mellitus (A1c 5.9),Diabetic neuropathy, hypothyroidism, asthma, Seasonal allergies, endometrial adenocarcinoma (s/pTotal abdominal hysterectomy and bilateral salpingo-oophorectomy in 06/2016),  morbid obesity who presents today with worsening diabetic neuropathy and bilateral pedal edema which is uncontrolled on current dose of gabapentin.  She complains of sharp pains more in her right leg and right feet which prevents her from bearing weight on her legs. She has also noticed swelling of both legs, worse in the right and her right leg feels heavy. She describes this as "having to think before she lifts up her right leg" and bearing weight on her feet causes more pain. On trying to return to work she only lasted 2 days before taking time off as symptoms worsened but then improved when she was off work. Denies cough pain, shortness of breath.  I had written her prescription for Lyrica however she is unable to obtain this through the patient assistance program as she is yet to turn in some required documents.  Also complained of darkening of the big toenails bilaterally.  Past Medical History:  Diagnosis Date  . Anemia   . Asthma   . Depression   . Hypothyroidism   . Pre-diabetes   . Sleep apnea   . Thyroid disease     Past Surgical History:  Procedure Laterality Date  . CARPAL TUNNEL RELEASE    . CESAREAN SECTION    . CHOLECYSTECTOMY    . cyst removal     uterus  . DILATION AND CURETTAGE OF UTERUS     still birth  . DILATION AND CURETTAGE OF UTERUS N/A 06/11/2016   Procedure: DILATATION AND CURETTAGE;  Surgeon: Emily Filbert, MD;  Location: [facility];  Service: Gynecology;  Laterality: N/A;  . ROBOTIC ASSISTED TOTAL HYSTERECTOMY WITH BILATERAL SALPINGO OOPHERECTOMY Bilateral 07/09/2016   Procedure: XI ROBOTIC ASSISTED TOTAL HYSTERECTOMY WITH BILATERAL SALPINGO OOPHORECTOMY FOR UTERUS GREATER THAN 250 GRAMS, LYSIS OF ADHESIONS;  Surgeon: Everitt Amber, MD;  Location: [facility];  Service: Gynecology;  Laterality: Bilateral;  . SINUS EXPLORATION    . WISDOM TOOTH EXTRACTION      Allergies  Allergen Reactions  . Ciprofloxacin Shortness Of Breath  . Shrimp [Shellfish Allergy]      Outpatient Medications Prior to Visit  Medication Sig Dispense Refill  . albuterol (PROVENTIL HFA;VENTOLIN HFA) 108 (90 Base) MCG/ACT inhaler Inhale 1-2 puffs into the lungs every 6 (six) hours as needed for wheezing or shortness of breath. 3 Inhaler 1  . albuterol (PROVENTIL) (2.5 MG/3ML) 0.083% nebulizer solution Take 3 mLs (2.5 mg total) by nebulization every 6 (six) hours as needed for wheezing or shortness of breath. 75 mL 3  . cetirizine (ZYRTEC) 10 MG tablet Take 10 mg by mouth daily.    . clotrimazole (LOTRIMIN) 1 % cream Apply 1 application topically 2 (two) times daily. 30 g 0  . DULoxetine (CYMBALTA) 60 MG capsule Take 1 capsule (60 mg total) by mouth daily. 30 capsule 3  . ferrous gluconate (FERGON) 324 MG tablet Take 1 tablet (324 mg total) by mouth daily with breakfast. 30 tablet 3  . Fluticasone-Salmeterol (ADVAIR DISKUS) 250-50 MCG/DOSE AEPB Inhale 1 puff into the lungs 2 (two) times daily. 1 each 3  . gabapentin (NEURONTIN) 300 MG capsule Take 3 capsules (900 mg total) by mouth  2 (two) times daily. 180 capsule 3  . ibuprofen (ADVIL,MOTRIN) 600 MG tablet Take 1 tablet (600 mg total) by mouth every 6 (six) hours as needed. 30 tablet 1  . levothyroxine (SYNTHROID, LEVOTHROID) 100 MCG tablet TAKE 2 TABLETS BY MOUTH DAILY BEFORE BREAKFAST. 60 tablet 2  . meloxicam (MOBIC) 7.5 MG tablet Take 1 tablet (7.5 mg total) by mouth daily. (Patient not taking: Reported on 08/27/2016) 30 tablet 1  . montelukast (SINGULAIR) 10 MG tablet Take 1 tablet (10 mg total) by mouth at bedtime. 90 tablet 1  .  oxyCODONE-acetaminophen (PERCOCET/ROXICET) 5-325 MG tablet Take 1-2 tablets by mouth every 4 (four) hours as needed (moderate to severe pain (when tolerating fluids)). 30 tablet 0  . pregabalin (LYRICA) 150 MG capsule Take 1 capsule (150 mg total) by mouth 2 (two) times daily. 180 capsule 3  . senna (SENOKOT) 8.6 MG TABS tablet Take 1 tablet (8.6 mg total) by mouth at bedtime. 120 each 0  . sulfamethoxazole-trimethoprim (BACTRIM DS) 800-160 MG tablet Take 1 tablet by mouth 2 (two) times daily. 20 tablet 0   No facility-administered medications prior to visit.     ROS Review of Systems Constitutional: Negative for activity change, appetite change and fatigue.  HENT: Negative for congestion, sinus pressure and sore throat.   Eyes: Negative for visual disturbance.  Respiratory: Negative for cough, chest tightness, shortness of breath and wheezing.   Cardiovascular: Negative for chest pain and palpitations.  Gastrointestinal: Negative for abdominal distention, abdominal pain and constipation.  Endocrine: Negative for polydipsia.  Genitourinary: Negative.  Negative for dysuria and frequency.  Musculoskeletal: Negative for arthralgias and back pain.       See hpi  Skin: Negative for rash.  Neurological: Positive for numbness. Negative for tremors and light-headedness.  Hematological: Does not bruise/bleed easily.  Psychiatric/Behavioral: Negative for agitation, behavioral problems and dysphoric mood.   Objective:  BP (!) 143/78 (BP Location: Right Arm, Patient Position: Sitting, Cuff Size: Large)   Pulse 98   Temp 98.8 F (37.1 C) (Oral)   Resp 18   Ht 5\' 9"  (1.753 m)   Wt (!) 364 lb (165.1 kg)   LMP  (LMP Unknown) Comment: irregular periods previously, bleeding started in january  SpO2 96%   BMI 53.75 kg/m   BP/Weight 09/16/2016 7/67/3419 04/18/9022  Systolic BP 097 353 299  Diastolic BP 78 65 69  Wt. (Lbs) 364 363 363.8  BMI 53.75 53.61 53.72      Physical Exam Constitutional:  She is oriented to person, place, and time. She appears well-developed and well-nourished.  Cardiovascular: Normal rate, normal heart sounds and intact distal pulses. 2+ eating pedal edema right greater than left  No murmur heard. Pulmonary/Chest: Effort normal and breath sounds normal. She has no wheezes. She has no rales. She exhibits no tenderness.  Abdominal: Soft. Bowel sounds are normal. She exhibits no distension and no mass. There is no tenderness.  Musculoskeletal: Normal range of motion.  Neurological: She is alert and oriented to person, place, and time.  Hyperesthesia in lower half of right leg and right foot  Skin: Purplish discoloration of bilateral toenails, left greater than right  Assessment & Plan:   1. Other diabetic neurological complication associated with type 2 diabetes mellitus ([place]) Uncontrolled on gabapentin Prescribed Lyrica which she has not been unable to obtain due to lack of medical coverage and she is yet to try paperwork for the patient's assistance program  2. Pedal edema Suspicious for venous insufficiency Advised  to obtain compression stockings Would love to referred to vascular-has been advised to apply for the [place] discount to facilitate this referral. - furosemide (LASIX) 40 MG tablet; Take 1 tablet (40 mg total) by mouth daily.  Dispense: 30 tablet; Refill: 3  3. Pain in both lower extremities Could be secondary to diabetic neuropathy - CK  4. Onychomycosis - terbinafine (LAMISIL) 250 MG tablet; Take 1 tablet (250 mg total) by mouth daily.  Dispense: 30 tablet; Refill: 1   Meds ordered this encounter  Medications  . furosemide (LASIX) 40 MG tablet    Sig: Take 1 tablet (40 mg total) by mouth daily.    Dispense:  30 tablet    Refill:  3  . terbinafine (LAMISIL) 250 MG tablet    Sig: Take 1 tablet (250 mg total) by mouth daily.    Dispense:  30 tablet    Refill:  1    Follow-up: Return in about 3 weeks (around 10/07/2016) for  Follow-up on pedal edema.   This note has been created with Surveyor, quantity. Any transcriptional errors are unintentional.     Arnoldo Morale MD

## 2016-09-16 NOTE — Patient Instructions (Signed)
Edema Edema is when you have too much fluid in your body or under your skin. Edema may make your legs, feet, and ankles swell up. Swelling is also common in looser tissues, like around your eyes. This is a common condition. It gets more common as you get older. There are many possible causes of edema. Eating too much salt (sodium) and being on your feet or sitting for a long time can cause edema in your legs, feet, and ankles. Hot weather may make edema worse. Edema is usually painless. Your skin may look swollen or shiny. Follow these instructions at home:  Keep the swollen body part raised (elevated) above the level of your heart when you are sitting or lying down.  Do not sit still or stand for a long time.  Do not wear tight clothes. Do not wear garters on your upper legs.  Exercise your legs. This can help the swelling go down.  Wear elastic bandages or support stockings as told by your doctor.  Eat a low-salt (low-sodium) diet to reduce fluid as told by your doctor.  Depending on the cause of your swelling, you may need to limit how much fluid you drink (fluid restriction).  Take over-the-counter and prescription medicines only as told by your doctor. Contact a doctor if:  Treatment is not working.  You have heart, liver, or kidney disease and have symptoms of edema.  You have sudden and unexplained weight gain. Get help right away if:  You have shortness of breath or chest pain.  You cannot breathe when you lie down.  You have pain, redness, or warmth in the swollen areas.  You have heart, liver, or kidney disease and get edema all of a sudden.  You have a fever and your symptoms get worse all of a sudden. Summary  Edema is when you have too much fluid in your body or under your skin.  Edema may make your legs, feet, and ankles swell up. Swelling is also common in looser tissues, like around your eyes.  Raise (elevate) the swollen body part above the level of your  heart when you are sitting or lying down.  Follow your doctor's instructions about diet and how much fluid you can drink (fluid restriction). This information is not intended to replace advice given to you by your health care provider. Make sure you discuss any questions you have with your health care provider. Document Released: 07/17/2007 Document Revised: 02/16/2016 Document Reviewed: 02/16/2016 Elsevier Interactive Patient Education  2017 [employer]  

## 2016-09-17 LAB — CK: CK TOTAL: 78 U/L (ref 24–173)

## 2016-09-24 ENCOUNTER — Ambulatory Visit: Payer: Self-pay

## 2016-10-04 MED FILL — GABAPENTIN 300 MG CAPSULE: 300 | 30 days supply | Qty: 180 | Fill #0 | Status: TO

## 2016-10-07 ENCOUNTER — Other Ambulatory Visit: Payer: Self-pay

## 2016-10-07 ENCOUNTER — Ambulatory Visit: Payer: Medicaid Other | Attending: Family Medicine | Admitting: Family Medicine

## 2016-10-07 ENCOUNTER — Encounter: Payer: Self-pay | Admitting: Family Medicine

## 2016-10-07 VITALS — BP 126/70 | HR 71 | Temp 97.8°F | Ht 69.0 in | Wt 361.2 lb

## 2016-10-07 DIAGNOSIS — R600 Localized edema: Secondary | ICD-10-CM | POA: Diagnosis not present

## 2016-10-07 DIAGNOSIS — Z90722 Acquired absence of ovaries, bilateral: Secondary | ICD-10-CM | POA: Diagnosis not present

## 2016-10-07 DIAGNOSIS — M79671 Pain in right foot: Secondary | ICD-10-CM

## 2016-10-07 DIAGNOSIS — Z6841 Body Mass Index (BMI) 40.0 and over, adult: Secondary | ICD-10-CM | POA: Insufficient documentation

## 2016-10-07 DIAGNOSIS — J45909 Unspecified asthma, uncomplicated: Secondary | ICD-10-CM | POA: Insufficient documentation

## 2016-10-07 DIAGNOSIS — Z90710 Acquired absence of both cervix and uterus: Secondary | ICD-10-CM | POA: Diagnosis not present

## 2016-10-07 DIAGNOSIS — G4730 Sleep apnea, unspecified: Secondary | ICD-10-CM | POA: Insufficient documentation

## 2016-10-07 DIAGNOSIS — E118 Type 2 diabetes mellitus with unspecified complications: Secondary | ICD-10-CM

## 2016-10-07 DIAGNOSIS — E1140 Type 2 diabetes mellitus with diabetic neuropathy, unspecified: Secondary | ICD-10-CM | POA: Diagnosis not present

## 2016-10-07 DIAGNOSIS — Z9889 Other specified postprocedural states: Secondary | ICD-10-CM | POA: Insufficient documentation

## 2016-10-07 DIAGNOSIS — Z91013 Allergy to seafood: Secondary | ICD-10-CM | POA: Insufficient documentation

## 2016-10-07 DIAGNOSIS — Z881 Allergy status to other antibiotic agents status: Secondary | ICD-10-CM | POA: Diagnosis not present

## 2016-10-07 DIAGNOSIS — E039 Hypothyroidism, unspecified: Secondary | ICD-10-CM

## 2016-10-07 DIAGNOSIS — J4540 Moderate persistent asthma, uncomplicated: Secondary | ICD-10-CM

## 2016-10-07 DIAGNOSIS — Z9049 Acquired absence of other specified parts of digestive tract: Secondary | ICD-10-CM | POA: Insufficient documentation

## 2016-10-07 DIAGNOSIS — M7989 Other specified soft tissue disorders: Secondary | ICD-10-CM | POA: Diagnosis present

## 2016-10-07 DIAGNOSIS — M79672 Pain in left foot: Secondary | ICD-10-CM

## 2016-10-07 LAB — GLUCOSE, POCT (MANUAL RESULT ENTRY): POC GLUCOSE: 121 mg/dL — AB (ref 70–99)

## 2016-10-07 MED ORDER — FLUTICASONE-SALMETEROL 250-50 MCG/DOSE IN AEPB: 1.0000 | INHALATION_SPRAY | Freq: Two times a day (BID) | RESPIRATORY_TRACT | 3 refills | Status: DC

## 2016-10-07 MED ORDER — PREGABALIN 150 MG PO CAPS: 150.0000 mg | ORAL_CAPSULE | Freq: Two times a day (BID) | ORAL | 3 refills | Status: DC

## 2016-10-07 MED ORDER — ALBUTEROL SULFATE HFA 108 (90 BASE) MCG/ACT IN AERS: 1.0000 | INHALATION_SPRAY | Freq: Four times a day (QID) | RESPIRATORY_TRACT | 3 refills | Status: DC | PRN

## 2016-10-07 NOTE — Progress Notes (Signed)
Subjective:  Patient ID: Janet [name], female    DOB: [birth year]  Age: 46 y.o. MRN: [id number]  CC: Leg swelling   HPI Janet [name] is a 46 year old female with a history of type 2 diabetes mellitus (A1c 5.9),Diabetic neuropathy, hypothyroidism, asthma, Seasonal allergies, endometrial adenocarcinoma (s/pTotal abdominal hysterectomy and bilateral salpingo-oophorectomy in 06/2016),  morbid obesity who presents today for follow-up of  worsening diabetic neuropathy and bilateral pedal edema which is uncontrolled on current dose of gabapentin. She is still unable to obtain Lyrica as this has to be ordered through the patient assistance program  She complains of sharp pains and points to the mid point of her right foot and ankle and also right big toe which prevents her from bearing weight on her legs. She has also noticed swelling of both legs, worse in the right and her right leg feels heavy. She complains of difficulty dorsiflexing and plantar flexing her right big toe which she did not complain of at her last visit. Left foot is not as swollen as the right; she does have some left ankle joint pain. Prefers not to wear a shoe on her right foot as this alleviates her symptoms.  She has still been unable to return to work due to her symptoms. On my observation of the patient as symptoms seem to be more tolerable today than at her last visit  Past Medical History:  Diagnosis Date  . Anemia   . Asthma   . Depression   . Hypothyroidism   . Pre-diabetes   . Sleep apnea   . Thyroid disease     Past Surgical History:  Procedure Laterality Date  . CARPAL TUNNEL RELEASE    . CESAREAN SECTION    . CHOLECYSTECTOMY    . cyst removal     uterus  . DILATION AND CURETTAGE OF UTERUS     still birth  . DILATION AND CURETTAGE OF UTERUS N/A 06/11/2016   Procedure: DILATATION AND CURETTAGE;  Surgeon: Emily Filbert, MD;  Location: [facility];  Service: Gynecology;  Laterality: N/A;  . ROBOTIC  ASSISTED TOTAL HYSTERECTOMY WITH BILATERAL SALPINGO OOPHERECTOMY Bilateral 07/09/2016   Procedure: XI ROBOTIC ASSISTED TOTAL HYSTERECTOMY WITH BILATERAL SALPINGO OOPHORECTOMY FOR UTERUS GREATER THAN 250 GRAMS, LYSIS OF ADHESIONS;  Surgeon: Everitt Amber, MD;  Location: [facility];  Service: Gynecology;  Laterality: Bilateral;  . SINUS EXPLORATION    . WISDOM TOOTH EXTRACTION      Allergies  Allergen Reactions  . Ciprofloxacin Shortness Of Breath  . Shrimp [Shellfish Allergy]      Outpatient Medications Prior to Visit  Medication Sig Dispense Refill  . albuterol (PROVENTIL HFA;VENTOLIN HFA) 108 (90 Base) MCG/ACT inhaler Inhale 1-2 puffs into the lungs every 6 (six) hours as needed for wheezing or shortness of breath. 3 Inhaler 1  . albuterol (PROVENTIL) (2.5 MG/3ML) 0.083% nebulizer solution Take 3 mLs (2.5 mg total) by nebulization every 6 (six) hours as needed for wheezing or shortness of breath. 75 mL 3  . cetirizine (ZYRTEC) 10 MG tablet Take 10 mg by mouth daily.    . DULoxetine (CYMBALTA) 60 MG capsule Take 1 capsule (60 mg total) by mouth daily. 30 capsule 3  . Fluticasone-Salmeterol (ADVAIR DISKUS) 250-50 MCG/DOSE AEPB Inhale 1 puff into the lungs 2 (two) times daily. 1 each 3  . furosemide (LASIX) 40 MG tablet Take 1 tablet (40 mg total) by mouth daily. 30 tablet 3  . gabapentin (NEURONTIN) 300 MG capsule Take 3 capsules (900 mg  total) by mouth 2 (two) times daily. 180 capsule 3  . ibuprofen (ADVIL,MOTRIN) 600 MG tablet Take 1 tablet (600 mg total) by mouth every 6 (six) hours as needed. 30 tablet 1  . levothyroxine (SYNTHROID, LEVOTHROID) 100 MCG tablet TAKE 2 TABLETS BY MOUTH DAILY BEFORE BREAKFAST. 60 tablet 2  . montelukast (SINGULAIR) 10 MG tablet Take 1 tablet (10 mg total) by mouth at bedtime. 90 tablet 1  . senna (SENOKOT) 8.6 MG TABS tablet Take 1 tablet (8.6 mg total) by mouth at bedtime. 120 each 0  . terbinafine (LAMISIL) 250 MG tablet Take 1 tablet (250 mg total) by mouth  daily. 30 tablet 1  . oxyCODONE-acetaminophen (PERCOCET/ROXICET) 5-325 MG tablet Take 1-2 tablets by mouth every 4 (four) hours as needed (moderate to severe pain (when tolerating fluids)). 30 tablet 0  . clotrimazole (LOTRIMIN) 1 % cream Apply 1 application topically 2 (two) times daily. (Patient not taking: Reported on 10/07/2016) 30 g 0  . ferrous gluconate (FERGON) 324 MG tablet Take 1 tablet (324 mg total) by mouth daily with breakfast. (Patient not taking: Reported on 10/07/2016) 30 tablet 3  . pregabalin (LYRICA) 150 MG capsule Take 1 capsule (150 mg total) by mouth 2 (two) times daily. (Patient not taking: Reported on 10/07/2016) 180 capsule 3  . sulfamethoxazole-trimethoprim (BACTRIM DS) 800-160 MG tablet Take 1 tablet by mouth 2 (two) times daily. (Patient not taking: Reported on 10/07/2016) 20 tablet 0  . meloxicam (MOBIC) 7.5 MG tablet Take 1 tablet (7.5 mg total) by mouth daily. (Patient not taking: Reported on 10/07/2016) 30 tablet 1   No facility-administered medications prior to visit.     ROS Review of Systems Constitutional: Negative for activity change, appetite change and fatigue.  HENT: Negative for congestion, sinus pressure and sore throat.   Eyes: Negative for visual disturbance.  Respiratory: Negative for cough, chest tightness, shortness of breath and wheezing.   Cardiovascular: Negative for chest pain and palpitations.  Gastrointestinal: Negative for abdominal distention, abdominal pain and constipation.  Endocrine: Negative for polydipsia.  Genitourinary: Negative.  Negative for dysuria and frequency.  Musculoskeletal: Negative for arthralgias and back pain.       See hpi  Skin: Negative for rash.  Neurological: Positive for numbness. Negative for tremors and light-headedness.  Hematological: Does not bruise/bleed easily.  Psychiatric/Behavioral: Negative for agitation, behavioral problems and dysphoric mood.  Objective:  BP 126/70   Pulse 71   Temp 97.8 F (36.6  C) (Oral)   Ht 5\' 9"  (1.753 m)   Wt (!) 361 lb 3.2 oz (163.8 kg)   LMP  (LMP Unknown) Comment: irregular periods previously, bleeding started in january  SpO2 98%   BMI 53.34 kg/m   BP/Weight 10/07/2016 09/16/2016 04/18/6576  Systolic BP 469 629 528  Diastolic BP 70 78 65  Wt. (Lbs) 361.2 364 363  BMI 53.34 53.75 53.61      Physical Exam Constitutional: She is oriented to person, place, and time. She appears well-developed and well-nourished.  Cardiovascular: Normal rate, normal heart sounds and intact distal pulses. 2+ pitting pedal edema right greater than left  No murmur heard. Pulmonary/Chest: Effort normal and breath sounds normal. She has no wheezes. She has no rales. She exhibits no tenderness.  Abdominal: Soft. Bowel sounds are normal. She exhibits no distension and no mass. There is no tenderness.  Musculoskeletal: Severely limited ability to dorsiflex or plantarflex right great toe. Tenderness on palpation of lateral malleolus of left foot and medial malleolus and anterior  joint line of right ankle.  Neurological: She is alert and oriented to person, place, and time.  Hyperesthesia in lower half of right leg and right foot  Skin: normal  Lab Results  Component Value Date   HGBA1C 5.9 (H) 07/02/2016    Assessment & Plan:   1. Type 2 diabetes mellitus with complication, without long-term current use of insulin (HCC) Controlled with A1c of 5.9 - POCT glucose (manual entry) - Basic Metabolic Panel  2. Hypothyroidism, unspecified type Stable Continue levothyroxine - TSH  3. Pedal edema Uncontrolled Continue Lasix, compression stockings - Ambulatory referral to Podiatry  4. Pain in both feet If x-ray is unrevealing we'll refer for MRI Continue Lasix Will need to exclude Charcot joint versus tendinopathy - Uric Acid - DG Foot Complete Left; Future - DG Foot Complete Right; Future - Ambulatory referral to Podiatry   No orders of the defined types were placed  in this encounter.   Follow-up: Return in about 6 weeks (around 11/18/2016) for Follow-up of leg swelling and foot pain.   Arnoldo Morale MD

## 2016-10-07 NOTE — Patient Instructions (Signed)
Edema Edema is when you have too much fluid in your body or under your skin. Edema may make your legs, feet, and ankles swell up. Swelling is also common in looser tissues, like around your eyes. This is a common condition. It gets more common as you get older. There are many possible causes of edema. Eating too much salt (sodium) and being on your feet or sitting for a long time can cause edema in your legs, feet, and ankles. Hot weather may make edema worse. Edema is usually painless. Your skin may look swollen or shiny. Follow these instructions at home:  Keep the swollen body part raised (elevated) above the level of your heart when you are sitting or lying down.  Do not sit still or stand for a long time.  Do not wear tight clothes. Do not wear garters on your upper legs.  Exercise your legs. This can help the swelling go down.  Wear elastic bandages or support stockings as told by your doctor.  Eat a low-salt (low-sodium) diet to reduce fluid as told by your doctor.  Depending on the cause of your swelling, you may need to limit how much fluid you drink (fluid restriction).  Take over-the-counter and prescription medicines only as told by your doctor. Contact a doctor if:  Treatment is not working.  You have heart, liver, or kidney disease and have symptoms of edema.  You have sudden and unexplained weight gain. Get help right away if:  You have shortness of breath or chest pain.  You cannot breathe when you lie down.  You have pain, redness, or warmth in the swollen areas.  You have heart, liver, or kidney disease and get edema all of a sudden.  You have a fever and your symptoms get worse all of a sudden. Summary  Edema is when you have too much fluid in your body or under your skin.  Edema may make your legs, feet, and ankles swell up. Swelling is also common in looser tissues, like around your eyes.  Raise (elevate) the swollen body part above the level of your  heart when you are sitting or lying down.  Follow your doctor's instructions about diet and how much fluid you can drink (fluid restriction). This information is not intended to replace advice given to you by your health care provider. Make sure you discuss any questions you have with your health care provider. Document Released: 07/17/2007 Document Revised: 02/16/2016 Document Reviewed: 02/16/2016 Elsevier Interactive Patient Education  2017 [employer]  

## 2016-10-08 ENCOUNTER — Other Ambulatory Visit: Payer: Self-pay | Admitting: Family Medicine

## 2016-10-08 DIAGNOSIS — E039 Hypothyroidism, unspecified: Secondary | ICD-10-CM

## 2016-10-08 LAB — BASIC METABOLIC PANEL
BUN/Creatinine Ratio: 10 (ref 9–23)
BUN: 7 mg/dL (ref 6–24)
CALCIUM: 9.2 mg/dL (ref 8.7–10.2)
CHLORIDE: 102 mmol/L (ref 96–106)
CO2: 24 mmol/L (ref 20–29)
Creatinine, Ser: 0.67 mg/dL (ref 0.57–1.00)
GFR calc Af Amer: 120 mL/min/{1.73_m2} (ref >59)
GFR calc non Af Amer: 104 mL/min/{1.73_m2} (ref >59)
GLUCOSE: 96 mg/dL (ref 65–99)
POTASSIUM: 3.8 mmol/L (ref 3.5–5.2)
SODIUM: 142 mmol/L (ref 134–144)

## 2016-10-08 LAB — TSH: TSH: 0.109 u[IU]/mL — AB (ref 0.450–4.500)

## 2016-10-08 LAB — URIC ACID: URIC ACID: 4.7 mg/dL (ref 2.5–7.1)

## 2016-10-08 MED ORDER — LEVOTHYROXINE SODIUM 88 MCG PO TABS: ORAL_TABLET | ORAL | 2 refills | Status: DC

## 2016-10-08 MED FILL — LEVOTHYROXINE 88 MCG TABLET: 88 | 30 days supply | Qty: 60 | Fill #0

## 2016-10-09 ENCOUNTER — Ambulatory Visit (HOSPITAL_COMMUNITY)
Admission: RE | Admit: 2016-10-09 | Discharge: 2016-10-09 | Disposition: A | Discharge status: Home / Self Care | Payer: Medicaid Other | Source: Ambulatory Visit | Attending: Family Medicine | Admitting: Family Medicine

## 2016-10-09 DIAGNOSIS — M7732 Calcaneal spur, left foot: Secondary | ICD-10-CM | POA: Insufficient documentation

## 2016-10-09 DIAGNOSIS — M19072 Primary osteoarthritis, left ankle and foot: Secondary | ICD-10-CM | POA: Diagnosis not present

## 2016-10-09 DIAGNOSIS — M79671 Pain in right foot: Secondary | ICD-10-CM | POA: Diagnosis present

## 2016-10-09 DIAGNOSIS — M7731 Calcaneal spur, right foot: Secondary | ICD-10-CM | POA: Diagnosis not present

## 2016-10-09 DIAGNOSIS — M19071 Primary osteoarthritis, right ankle and foot: Secondary | ICD-10-CM | POA: Diagnosis not present

## 2016-10-09 DIAGNOSIS — M79672 Pain in left foot: Secondary | ICD-10-CM | POA: Diagnosis present

## 2016-10-14 ENCOUNTER — Other Ambulatory Visit: Payer: Self-pay | Admitting: Family Medicine

## 2016-10-14 DIAGNOSIS — B351 Tinea unguium: Secondary | ICD-10-CM

## 2016-10-15 ENCOUNTER — Ambulatory Visit: Payer: Self-pay

## 2016-10-24 ENCOUNTER — Other Ambulatory Visit: Payer: Self-pay

## 2016-10-24 MED ORDER — PREGABALIN 150 MG PO CAPS
150.0000 mg | ORAL_CAPSULE | Freq: Two times a day (BID) | ORAL | 3 refills | Status: DC
Start: 2016-10-24 — End: 2016-11-18

## 2016-10-28 ENCOUNTER — Ambulatory Visit (INDEPENDENT_AMBULATORY_CARE_PROVIDER_SITE_OTHER): Payer: Medicaid Other | Admitting: Podiatry

## 2016-10-28 DIAGNOSIS — M792 Neuralgia and neuritis, unspecified: Secondary | ICD-10-CM | POA: Diagnosis not present

## 2016-10-28 DIAGNOSIS — M21372 Foot drop, left foot: Secondary | ICD-10-CM

## 2016-10-28 NOTE — Progress Notes (Signed)
Subjective:  Patient ID: Janet [name], female    DOB: 28-Mar-1970,  MRN: [id number] HPI Chief Complaint  Patient presents with  . Foot Pain    bilateral foot pain   seen at [facility] completed    46 y.o. female presents with the above complaint. Complains of R foot numbness, tingling, and weakness. Reports she has difficulty pulling her right great toe and foot up and is concerned of falling. States that the numbing pain shoots up her R leg. Taking 1800 mg Gabapentin prescribed by her PCP.  Also complains of L great toe pain and nail discoloration. Was given lamisil by her PCP but does not believe it to be helping. Denies known injury to the toe. Never had this issue before.  Past Medical History:  Diagnosis Date  . Anemia   . Asthma   . Depression   . Hypothyroidism   . Pre-diabetes   . Sleep apnea   . Thyroid disease    Past Surgical History:  Procedure Laterality Date  . CARPAL TUNNEL RELEASE    . CESAREAN SECTION    . CHOLECYSTECTOMY    . cyst removal     uterus  . DILATION AND CURETTAGE OF UTERUS     still birth  . DILATION AND CURETTAGE OF UTERUS N/A 06/11/2016   Procedure: DILATATION AND CURETTAGE;  Surgeon: Emily Filbert, MD;  Location: [facility];  Service: Gynecology;  Laterality: N/A;  . ROBOTIC ASSISTED TOTAL HYSTERECTOMY WITH BILATERAL SALPINGO OOPHERECTOMY Bilateral 07/09/2016   Procedure: XI ROBOTIC ASSISTED TOTAL HYSTERECTOMY WITH BILATERAL SALPINGO OOPHORECTOMY FOR UTERUS GREATER THAN 250 GRAMS, LYSIS OF ADHESIONS;  Surgeon: Everitt Amber, MD;  Location: [facility];  Service: Gynecology;  Laterality: Bilateral;  . SINUS EXPLORATION    . WISDOM TOOTH EXTRACTION      Current Outpatient Prescriptions:  .  albuterol (PROVENTIL HFA;VENTOLIN HFA) 108 (90 Base) MCG/ACT inhaler, Inhale 1-2 puffs into the lungs every 6 (six) hours as needed for wheezing or shortness of breath., Disp: 54 g, Rfl: 3 .  albuterol (PROVENTIL) (2.5 MG/3ML) 0.083% nebulizer solution, Take 3 mLs (2.5  mg total) by nebulization every 6 (six) hours as needed for wheezing or shortness of breath., Disp: 75 mL, Rfl: 3 .  cetirizine (ZYRTEC) 10 MG tablet, Take 10 mg by mouth daily., Disp: , Rfl:  .  DULoxetine (CYMBALTA) 60 MG capsule, Take 1 capsule (60 mg total) by mouth daily., Disp: 30 capsule, Rfl: 3 .  furosemide (LASIX) 40 MG tablet, Take 1 tablet (40 mg total) by mouth daily., Disp: 30 tablet, Rfl: 3 .  gabapentin (NEURONTIN) 300 MG capsule, Take 3 capsules (900 mg total) by mouth 2 (two) times daily., Disp: 180 capsule, Rfl: 3 .  ibuprofen (ADVIL,MOTRIN) 600 MG tablet, Take 1 tablet (600 mg total) by mouth every 6 (six) hours as needed., Disp: 30 tablet, Rfl: 1 .  levothyroxine (SYNTHROID, LEVOTHROID) 88 MCG tablet, TAKE 2 TABLETS BY MOUTH DAILY BEFORE BREAKFAST., Disp: 88 tablet, Rfl: 2 .  montelukast (SINGULAIR) 10 MG tablet, Take 1 tablet (10 mg total) by mouth at bedtime., Disp: 90 tablet, Rfl: 1 .  senna (SENOKOT) 8.6 MG TABS tablet, Take 1 tablet (8.6 mg total) by mouth at bedtime., Disp: 120 each, Rfl: 0 .  terbinafine (LAMISIL) 250 MG tablet, Take 1 tablet (250 mg total) by mouth daily., Disp: 30 tablet, Rfl: 1 .  clotrimazole (LOTRIMIN) 1 % cream, Apply 1 application topically 2 (two) times daily. (Patient not taking: Reported on 10/07/2016),  Disp: 30 g, Rfl: 0 .  ferrous gluconate (FERGON) 324 MG tablet, Take 1 tablet (324 mg total) by mouth daily with breakfast. (Patient not taking: Reported on 10/07/2016), Disp: 30 tablet, Rfl: 3 .  Fluticasone-Salmeterol (ADVAIR DISKUS) 250-50 MCG/DOSE AEPB, Inhale 1 puff into the lungs 2 (two) times daily., Disp: 180 each, Rfl: 3 .  pregabalin (LYRICA) 150 MG capsule, Take 1 capsule (150 mg total) by mouth 2 (two) times daily., Disp: 180 capsule, Rfl: 3 .  sulfamethoxazole-trimethoprim (BACTRIM DS) 800-160 MG tablet, Take 1 tablet by mouth 2 (two) times daily. (Patient not taking: Reported on 10/07/2016), Disp: 20 tablet, Rfl: 0  Allergies  Allergen  Reactions  . Ciprofloxacin Shortness Of Breath  . Shrimp [Shellfish Allergy]    Review of Systems Objective:  There were no vitals filed for this visit. General AA&O x3. Normal mood and affect.  Vascular Dorsalis pedis and posterior tibial pulses 2/4 bilat. Brisk capillary refill to all digits. Pedal hair present.  Neurologic Epicritic sensation grossly intact. Hypersthesia R great toe +Tinel's R common peroneal nerve. Protective sensation 5/5 L, 2/5 R Vibratory intact bilat. Achilles reflexes equal and symmetric bilat.  Dermatologic No open lesions. Interspaces clear of maceration. L hallux nail with non-acute subungual hemorrhage. No signs of nail dystrophy. Other nails well groomed and normal in appearance.  Orthopedic: MMT 5/5 in dorsiflexion, plantarflexion, inversion, and eversion L MMT 5/5 in plantarflexion, inversion, eversion R. DF 2/5 R. Normal joint ROM without pain or crepitus.   Radiographs: Taken and reviewed. No acute fractures or dislocations. No other osseous abnormalities. Assessment & Plan:  Patient was evaluated and treated and all questions answered.  R Foot Drop -Unclear etiology. ?L5 radiculopathy -Discussed ordering NCS/EMG to further evaluate nerve symptoms to RLE.  L Hallux Subungual Hematoma -Advised that the discoloration appears to be hematoma rather than fungal and thus advised lamisil is not likely to improve this. Advised either way she must wait for the new nail to grow in to notice a change in color of the nail.  No Follow-up on file.

## 2016-11-01 ENCOUNTER — Telehealth: Payer: Self-pay

## 2016-11-01 DIAGNOSIS — C541 Malignant neoplasm of endometrium: Secondary | ICD-10-CM

## 2016-11-06 NOTE — Telephone Encounter (Signed)
Requested pt call the office to discuss scheduling a CT A/P with contrast to assess the abdominal cramping per Dr. Denman George.

## 2016-11-14 NOTE — Telephone Encounter (Signed)
Pt called back to confirm CT scan appointment 11-19-16 as she was called by radiology scheduling.  Pt stated she will keep CT appointment and Dr. Serita Grit office will call her with the results.

## 2016-11-18 ENCOUNTER — Encounter: Payer: Self-pay | Admitting: Family Medicine

## 2016-11-18 ENCOUNTER — Ambulatory Visit: Payer: Medicaid Other | Attending: Family Medicine | Admitting: Family Medicine

## 2016-11-18 ENCOUNTER — Other Ambulatory Visit: Payer: Self-pay | Admitting: Gynecologic Oncology

## 2016-11-18 VITALS — BP 128/73 | HR 70 | Temp 97.6°F | Ht 69.0 in | Wt 362.4 lb

## 2016-11-18 DIAGNOSIS — J4540 Moderate persistent asthma, uncomplicated: Secondary | ICD-10-CM

## 2016-11-18 DIAGNOSIS — Z8542 Personal history of malignant neoplasm of other parts of uterus: Secondary | ICD-10-CM | POA: Insufficient documentation

## 2016-11-18 DIAGNOSIS — M21371 Foot drop, right foot: Secondary | ICD-10-CM | POA: Diagnosis not present

## 2016-11-18 DIAGNOSIS — Z791 Long term (current) use of non-steroidal anti-inflammatories (NSAID): Secondary | ICD-10-CM | POA: Diagnosis not present

## 2016-11-18 DIAGNOSIS — G4730 Sleep apnea, unspecified: Secondary | ICD-10-CM | POA: Diagnosis not present

## 2016-11-18 DIAGNOSIS — Z90710 Acquired absence of both cervix and uterus: Secondary | ICD-10-CM | POA: Diagnosis not present

## 2016-11-18 DIAGNOSIS — K5900 Constipation, unspecified: Secondary | ICD-10-CM | POA: Diagnosis not present

## 2016-11-18 DIAGNOSIS — M7989 Other specified soft tissue disorders: Secondary | ICD-10-CM | POA: Diagnosis not present

## 2016-11-18 DIAGNOSIS — Z79899 Other long term (current) drug therapy: Secondary | ICD-10-CM | POA: Diagnosis not present

## 2016-11-18 DIAGNOSIS — Z9049 Acquired absence of other specified parts of digestive tract: Secondary | ICD-10-CM | POA: Insufficient documentation

## 2016-11-18 DIAGNOSIS — K5909 Other constipation: Secondary | ICD-10-CM | POA: Diagnosis not present

## 2016-11-18 DIAGNOSIS — F330 Major depressive disorder, recurrent, mild: Secondary | ICD-10-CM | POA: Diagnosis not present

## 2016-11-18 DIAGNOSIS — E039 Hypothyroidism, unspecified: Secondary | ICD-10-CM | POA: Insufficient documentation

## 2016-11-18 DIAGNOSIS — Z9889 Other specified postprocedural states: Secondary | ICD-10-CM | POA: Insufficient documentation

## 2016-11-18 DIAGNOSIS — E1140 Type 2 diabetes mellitus with diabetic neuropathy, unspecified: Secondary | ICD-10-CM | POA: Insufficient documentation

## 2016-11-18 DIAGNOSIS — E118 Type 2 diabetes mellitus with unspecified complications: Secondary | ICD-10-CM

## 2016-11-18 DIAGNOSIS — C541 Malignant neoplasm of endometrium: Secondary | ICD-10-CM

## 2016-11-18 LAB — GLUCOSE, POCT (MANUAL RESULT ENTRY): POC GLUCOSE: 113 mg/dL — AB (ref 70–99)

## 2016-11-18 LAB — POCT GLYCOSYLATED HEMOGLOBIN (HGB A1C): Hemoglobin A1C: 6.2

## 2016-11-18 MED ORDER — PREGABALIN 150 MG PO CAPS: 150.0000 mg | ORAL_CAPSULE | Freq: Two times a day (BID) | ORAL | 3 refills | Status: DC

## 2016-11-18 NOTE — Progress Notes (Signed)
Subjective:  Patient ID: Janet [name], female    DOB: [birth year]  Age: 45 y.o. MRN: [id number]  CC: Diabetes   HPI Janet [name]  is a 46 year old female with a history of type 2 diabetes mellitus (diet controlled with A1c of 6.2), Diabetic neuropathy, hypothyroidism, asthma, Seasonal allergies, endometrial adenocarcinoma (s/pTotal abdominal hysterectomy and bilateral salpingo-oophorectomy in 06/2016),  morbid obesity who presents today for follow-up visit.  She had been referred to podiatry due to worsening foot pain, diabetic neuropathy and swelling of the right leg. She was diagnosed with foot drop and referred for EMG which is pending. She continues to have difficulty lifting up her foot but has to lift up her whole leg; edema has resolved and she currently remains on gabapentin as she has been unable to obtain Lyrica through the medication assistance program.  Her diabetes has been stable and she denies hypoglycemia, visual complains. Asthma is controlled on her current inhalers and she denies any acute exacerbation, wheezing or shortness of breath.  She endorses constipation and moves her bowel every third day - has to use a stool softener to move bowels. Denies nausea, vomiting. She has had some fatigue and is wondering if her thyroid levels are off.  She is scheduled for abdominal CT ordered by GYN to follow-up on her hysterectomy due to abdominal cramping.  Past Medical History:  Diagnosis Date  . Anemia   . Asthma   . Depression   . Hypothyroidism   . Pre-diabetes   . Sleep apnea   . Thyroid disease     Past Surgical History:  Procedure Laterality Date  . CARPAL TUNNEL RELEASE    . CESAREAN SECTION    . CHOLECYSTECTOMY    . cyst removal     uterus  . DILATION AND CURETTAGE OF UTERUS     still birth  . DILATION AND CURETTAGE OF UTERUS N/A 06/11/2016   Procedure: DILATATION AND CURETTAGE;  Surgeon: Emily Filbert, MD;  Location: [facility];  Service: Gynecology;   Laterality: N/A;  . ROBOTIC ASSISTED TOTAL HYSTERECTOMY WITH BILATERAL SALPINGO OOPHERECTOMY Bilateral 07/09/2016   Procedure: XI ROBOTIC ASSISTED TOTAL HYSTERECTOMY WITH BILATERAL SALPINGO OOPHORECTOMY FOR UTERUS GREATER THAN 250 GRAMS, LYSIS OF ADHESIONS;  Surgeon: Everitt Amber, MD;  Location: [facility];  Service: Gynecology;  Laterality: Bilateral;  . SINUS EXPLORATION    . WISDOM TOOTH EXTRACTION      Outpatient Medications Prior to Visit  Medication Sig Dispense Refill  . albuterol (PROVENTIL HFA;VENTOLIN HFA) 108 (90 Base) MCG/ACT inhaler Inhale 1-2 puffs into the lungs every 6 (six) hours as needed for wheezing or shortness of breath. 54 g 3  . albuterol (PROVENTIL) (2.5 MG/3ML) 0.083% nebulizer solution Take 3 mLs (2.5 mg total) by nebulization every 6 (six) hours as needed for wheezing or shortness of breath. 75 mL 3  . cetirizine (ZYRTEC) 10 MG tablet Take 10 mg by mouth daily.    . DULoxetine (CYMBALTA) 60 MG capsule Take 1 capsule (60 mg total) by mouth daily. 30 capsule 3  . Fluticasone-Salmeterol (ADVAIR DISKUS) 250-50 MCG/DOSE AEPB Inhale 1 puff into the lungs 2 (two) times daily. 180 each 3  . gabapentin (NEURONTIN) 300 MG capsule Take 3 capsules (900 mg total) by mouth 2 (two) times daily. 180 capsule 3  . ibuprofen (ADVIL,MOTRIN) 600 MG tablet Take 1 tablet (600 mg total) by mouth every 6 (six) hours as needed. 30 tablet 1  . levothyroxine (SYNTHROID, LEVOTHROID) 88 MCG tablet TAKE 2 TABLETS BY  MOUTH DAILY BEFORE BREAKFAST. 88 tablet 2  . montelukast (SINGULAIR) 10 MG tablet Take 1 tablet (10 mg total) by mouth at bedtime. 90 tablet 1  . clotrimazole (LOTRIMIN) 1 % cream Apply 1 application topically 2 (two) times daily. (Patient not taking: Reported on 10/07/2016) 30 g 0  . ferrous gluconate (FERGON) 324 MG tablet Take 1 tablet (324 mg total) by mouth daily with breakfast. (Patient not taking: Reported on 10/07/2016) 30 tablet 3  . senna (SENOKOT) 8.6 MG TABS tablet Take 1 tablet (8.6  mg total) by mouth at bedtime. (Patient not taking: Reported on 11/18/2016) 120 each 0  . sulfamethoxazole-trimethoprim (BACTRIM DS) 800-160 MG tablet Take 1 tablet by mouth 2 (two) times daily. (Patient not taking: Reported on 10/07/2016) 20 tablet 0  . furosemide (LASIX) 40 MG tablet Take 1 tablet (40 mg total) by mouth daily. (Patient not taking: Reported on 11/18/2016) 30 tablet 3  . pregabalin (LYRICA) 150 MG capsule Take 1 capsule (150 mg total) by mouth 2 (two) times daily. (Patient not taking: Reported on 11/18/2016) 180 capsule 3  . terbinafine (LAMISIL) 250 MG tablet Take 1 tablet (250 mg total) by mouth daily. (Patient not taking: Reported on 11/18/2016) 30 tablet 1   No facility-administered medications prior to visit.     ROS Review of Systems  Constitutional: Negative for activity change, appetite change and fatigue.  HENT: Negative for congestion, sinus pressure and sore throat.   Eyes: Negative for visual disturbance.  Respiratory: Negative for cough, chest tightness, shortness of breath and wheezing.   Cardiovascular: Negative for chest pain and palpitations.  Gastrointestinal: Positive for constipation. Negative for abdominal distention and abdominal pain.  Endocrine: Negative for polydipsia.  Genitourinary: Negative for dysuria and frequency.  Musculoskeletal:       See hpi  Skin: Negative for rash.  Neurological: Negative for tremors, light-headedness and numbness.  Hematological: Does not bruise/bleed easily.  Psychiatric/Behavioral: Negative for agitation and behavioral problems.    Objective:  BP 128/73   Pulse 70   Temp 97.6 F (36.4 C) (Oral)   Ht '5\' 9"'  (1.753 m)   Wt (!) 362 lb 6.4 oz (164.4 kg)   LMP  (LMP Unknown) Comment: irregular periods previously, bleeding started in january  SpO2 98%   BMI 53.52 kg/m   BP/Weight 11/18/2016 2/67/1245 8/0/9983  Systolic BP 382 505 397  Diastolic BP 73 70 78  Wt. (Lbs) 362.4 361.2 364  BMI 53.52 53.34 53.75       Physical Exam  Constitutional: She is oriented to person, place, and time. She appears well-developed and well-nourished.  Cardiovascular: Normal rate, normal heart sounds and intact distal pulses.   No murmur heard. Pulmonary/Chest: Effort normal and breath sounds normal. She has no wheezes. She has no rales. She exhibits no tenderness.  Abdominal: Soft. Bowel sounds are normal. She exhibits no distension and no mass. There is no tenderness.  Musculoskeletal: She exhibits no edema or tenderness.  Limited dorsiflexion of the right foot  Neurological: She is alert and oriented to person, place, and time.  Hyperesthesia in palpation of lateral aspect right foot  Skin: Skin is warm and dry.  Psychiatric: She has a normal mood and affect.    CMP Latest Ref Rng & Units 10/07/2016 07/10/2016 07/02/2016  Glucose 65 - 99 mg/dL 96 144(H) 144(H)  BUN 6 - 24 mg/dL '7 8 9  ' Creatinine 0.57 - 1.00 mg/dL 0.67 0.85 0.84  Sodium 134 - 144 mmol/L 142 139 139  Potassium 3.5 - 5.2 mmol/L 3.8 4.7 4.0  Chloride 96 - 106 mmol/L 102 107 107  CO2 20 - 29 mmol/L '24 24 24  ' Calcium 8.7 - 10.2 mg/dL 9.2 8.7(L) 8.6(L)  Total Protein 6.5 - 8.1 g/dL - - 6.9  Total Bilirubin 0.3 - 1.2 mg/dL - - 0.6  Alkaline Phos 38 - 126 U/L - - 66  AST 15 - 41 U/L - - 24  ALT 14 - 54 U/L - - 16    Lipid Panel     Component Value Date/Time   CHOL 152 12/26/2015 0929   TRIG 73 12/26/2015 0929   HDL 74 12/26/2015 0929   CHOLHDL 2.1 12/26/2015 0929   VLDL 15 12/26/2015 0929   LDLCALC 63 12/26/2015 0929    Lab Results  Component Value Date   HGBA1C 6.2 11/18/2016    Lab Results  Component Value Date   TSH 0.109 (L) 10/07/2016    Assessment & Plan:   1. Type 2 diabetes mellitus with complication, without long-term current use of insulin (HCC) Diet controlled with A1c of 6.2 - POCT glucose (manual entry) - POCT glycosylated hemoglobin (Hb A1C) - CMP14+EGFR - Lipid panel - Microalbumin/Creatinine Ratio,  Urine  2. Other constipation Placed MiraLAX Advised to increase fiber intake  3. Hypothyroidism, unspecified type Complains of fatigue, will check thyroid function - TSH  4. Moderate persistent asthma without complication Stable, no acute flares Continue Advair, Proventil  5. Mild episode of recurrent major depressive disorder (HCC) Currently on Cymbalta  6. Right foot drop Idiopathic Currently awaiting EMG which was ordered by podiatry Fall precautions - Ambulatory referral to Neurology - DG Lumbar Spine Complete; Future - AMB referral to rehabilitation   Meds ordered this encounter  Medications  . pregabalin (LYRICA) 150 MG capsule    Sig: Take 1 capsule (150 mg total) by mouth 2 (two) times daily.    Dispense:  60 capsule    Refill:  3    Follow-up: Return in about 3 months (around 02/18/2017) for Follow-up on diabetes mellitus.   Arnoldo Morale MD

## 2016-11-18 NOTE — Patient Instructions (Signed)
Common Peroneal Nerve Entrapment Common peroneal nerve entrapment is a condition that can make it hard to lift a foot. The condition results from pressure on a nerve in the lower leg called the common peroneal nerve. Your common peroneal nerve provides feeling to your outer lower leg and foot. It also supplies the muscles that move your foot and toes upward and outward. What are the causes? This condition may be caused by:  A hard, direct hit to the side of the lower leg.  Swelling from a knee injury.  A break (fracture) in one of the lower leg bones.  Wearing a boot or cast that ends just below the knee.  A growth or cyst near the nerve.  What increases the risk? This condition is more likely to develop in people who play:  Contact sports, such as football or hockey.  Sports in which you wear high and stiff boots, such as skiing.  What are the signs or symptoms? Symptoms of this condition include:  Trouble lifting your foot up.  Tripping often.  Your foot hitting the ground harder than normal as you walk.  Numbness, tingling, or pain in the outside of the knee, outside of the lower leg, and top of the foot.  Sensitivity to pressure on the front or side of the leg.  How is this diagnosed? This condition may be diagnosed based on:  Your symptoms.  Your medical history.  A physical exam.  Tests, such as: ? An X-ray to check the bones of your knee and leg. ? MRI to check tendons that attach to the side of your knee. ? An electromyogram (EMG) to check your nerves.  During your physical exam, your health care provider will check for numbness in your leg and test the strength of your lower leg muscles. He or she may tap the side of your lower leg to see if that causes tingling. How is this treated? Treatment for this condition may include:  Avoiding activities that make symptoms worse.  Using a brace to hold up your foot and toes.  Taking anti-inflammatory pain  medicines to relieve swelling and reduce pain.  Having medicines injected into your ankle joint to reduce pain and swelling.  Physical therapy. This involves doing exercises.  Returning gradually to full activity.  Surgery to take pressure off the nerve. This may be needed if there is no improvement after 2-3 months or if there is a growth pushing on the nerve.  Follow these instructions at home: If you have a brace:  Wear it as told by your health care provider. Remove it only as told by your health care provider.  Loosen the brace if your toes tingle, become numb, or turn cold and blue.  Keep the brace clean.  If the brace is not waterproof: ? Do not let it get wet. ? Cover it with a watertight covering when you take a bath or a shower.  Ask your health care provider when it is safe to drive with a brace on your foot. Activity  Return to your normal activities as told by your health care provider. Ask your health care provider what activities are safe for you.  Do not do any activities that make pain or swelling worse.  Do exercises as told by your health care provider. General instructions  Take over-the-counter and prescription medicines only as told by your health care provider.  Do not put your full weight on your knee until your health care provider   says you can.  Keep all follow-up visits as told by your health care provider. This is important. How is this prevented?  Wear supportive footwear that is appropriate for your athletic activity.  Avoid athletic activities that cause ankle pain or swelling.  Wear protective padding over your lower legs when playing contact sports.  Make sure your boots do not put extra pressure on the area just below your knees.  Do not sit cross-legged for long periods of time. Contact a health care provider if:  Your symptoms do not get better in 2-3 months.  The weakness or numbness in your leg or foot gets worse. This  information is not intended to replace advice given to you by your health care provider. Make sure you discuss any questions you have with your health care provider. Document Released: 01/28/2005 Document Revised: 10/03/2015 Document Reviewed: 12/16/2014 Elsevier Interactive Patient Education  2018 [employer]vier [employer].  

## 2016-11-19 ENCOUNTER — Ambulatory Visit (HOSPITAL_COMMUNITY): Admission: RE | Admit: 2016-11-19 | Payer: Medicaid Other | Source: Ambulatory Visit

## 2016-11-20 LAB — LIPID PANEL
CHOL/HDL RATIO: 2 ratio (ref 0.0–4.4)
Cholesterol, Total: 131 mg/dL (ref 100–199)
HDL: 67 mg/dL (ref >39)
LDL Calculated: 50 mg/dL (ref 0–99)
TRIGLYCERIDES: 71 mg/dL (ref 0–149)
VLDL Cholesterol Cal: 14 mg/dL (ref 5–40)

## 2016-11-20 LAB — TSH: TSH: 0.177 u[IU]/mL — ABNORMAL LOW (ref 0.450–4.500)

## 2016-11-20 LAB — CMP14+EGFR
A/G RATIO: 1.6 (ref 1.2–2.2)
ALT: 14 IU/L (ref 0–32)
AST: 23 IU/L (ref 0–40)
Albumin: 4.2 g/dL (ref 3.5–5.5)
Alkaline Phosphatase: 106 IU/L (ref 39–117)
BUN/Creatinine Ratio: 10 (ref 9–23)
BUN: 7 mg/dL (ref 6–24)
Bilirubin Total: 0.6 mg/dL (ref 0.0–1.2)
CALCIUM: 9 mg/dL (ref 8.7–10.2)
CO2: 28 mmol/L (ref 20–29)
Chloride: 104 mmol/L (ref 96–106)
Creatinine, Ser: 0.69 mg/dL (ref 0.57–1.00)
GFR calc Af Amer: 119 mL/min/{1.73_m2} (ref >59)
GFR calc non Af Amer: 103 mL/min/{1.73_m2} (ref >59)
Globulin, Total: 2.6 g/dL (ref 1.5–4.5)
Glucose: 105 mg/dL — ABNORMAL HIGH (ref 65–99)
Potassium: 4.1 mmol/L (ref 3.5–5.2)
Sodium: 141 mmol/L (ref 134–144)
Total Protein: 6.8 g/dL (ref 6.0–8.5)

## 2016-11-20 LAB — MICROALBUMIN / CREATININE URINE RATIO

## 2016-11-21 ENCOUNTER — Ambulatory Visit (HOSPITAL_COMMUNITY)
Admission: RE | Admit: 2016-11-21 | Discharge: 2016-11-21 | Disposition: A | Discharge status: Home / Self Care | Payer: Medicaid Other | Source: Ambulatory Visit | Attending: Family Medicine | Admitting: Family Medicine

## 2016-11-21 ENCOUNTER — Ambulatory Visit (HOSPITAL_COMMUNITY)
Admission: RE | Admit: 2016-11-21 | Discharge: 2016-11-21 | Disposition: A | Discharge status: Home / Self Care | Payer: Medicaid Other | Source: Ambulatory Visit | Attending: Gynecologic Oncology | Admitting: Gynecologic Oncology

## 2016-11-21 ENCOUNTER — Encounter (HOSPITAL_COMMUNITY): Payer: Self-pay

## 2016-11-21 DIAGNOSIS — R161 Splenomegaly, not elsewhere classified: Secondary | ICD-10-CM | POA: Diagnosis not present

## 2016-11-21 DIAGNOSIS — M21371 Foot drop, right foot: Secondary | ICD-10-CM

## 2016-11-21 DIAGNOSIS — C541 Malignant neoplasm of endometrium: Secondary | ICD-10-CM

## 2016-11-21 DIAGNOSIS — R590 Localized enlarged lymph nodes: Secondary | ICD-10-CM | POA: Diagnosis not present

## 2016-11-21 DIAGNOSIS — K766 Portal hypertension: Secondary | ICD-10-CM | POA: Insufficient documentation

## 2016-11-21 LAB — POCT I-STAT CREATININE: Creatinine, Ser: 0.7 mg/dL (ref 0.44–1.00)

## 2016-11-21 MED ORDER — IOPAMIDOL (ISOVUE-300) INJECTION 61%
INTRAVENOUS | Status: AC
  Filled 2016-11-21: qty 100

## 2016-11-21 MED ORDER — IOPAMIDOL (ISOVUE-300) INJECTION 61%
100.0000 mL | Freq: Once | INTRAVENOUS | Status: AC | PRN
  Administered 2016-11-21: 100 mL via INTRAVENOUS

## 2016-11-22 ENCOUNTER — Telehealth: Payer: Self-pay | Admitting: Gynecologic Oncology

## 2016-11-22 ENCOUNTER — Other Ambulatory Visit: Payer: Self-pay | Admitting: Family Medicine

## 2016-11-22 ENCOUNTER — Other Ambulatory Visit: Payer: Self-pay | Admitting: Gynecologic Oncology

## 2016-11-22 DIAGNOSIS — C541 Malignant neoplasm of endometrium: Secondary | ICD-10-CM

## 2016-11-22 DIAGNOSIS — E039 Hypothyroidism, unspecified: Secondary | ICD-10-CM

## 2016-11-22 MED ORDER — LEVOTHYROXINE SODIUM 150 MCG PO TABS: 150.0000 ug | ORAL_TABLET | Freq: Every day | ORAL | 3 refills | Status: DC

## 2016-11-22 NOTE — Telephone Encounter (Signed)
Left message for patient. Advised patient to please call the office to discuss CT scan and recommendations for PET.

## 2016-11-27 ENCOUNTER — Encounter: Payer: Self-pay | Admitting: Gynecologic Oncology

## 2016-11-27 NOTE — Progress Notes (Unsigned)
Peer to peer performed for PET scan.  Approved with Auth# [id number].

## 2016-12-04 ENCOUNTER — Encounter (HOSPITAL_COMMUNITY)
Admission: RE | Admit: 2016-12-04 | Discharge: 2016-12-04 | Disposition: A | Discharge status: Home / Self Care | Payer: Medicaid Other | Source: Ambulatory Visit | Attending: Gynecologic Oncology | Admitting: Gynecologic Oncology

## 2016-12-04 DIAGNOSIS — C541 Malignant neoplasm of endometrium: Secondary | ICD-10-CM | POA: Insufficient documentation

## 2016-12-04 LAB — GLUCOSE, CAPILLARY: Glucose-Capillary: 105 mg/dL — ABNORMAL HIGH (ref 65–99)

## 2016-12-04 MED ORDER — FLUDEOXYGLUCOSE F - 18 (FDG) INJECTION
16.0000 | Freq: Once | INTRAVENOUS | Status: AC | PRN
  Administered 2016-12-04: 16 via INTRAVENOUS

## 2016-12-23 ENCOUNTER — Encounter: Payer: Self-pay | Admitting: Neurology

## 2016-12-23 ENCOUNTER — Ambulatory Visit: Payer: Medicaid Other | Admitting: Neurology

## 2016-12-23 VITALS — BP 137/75 | HR 75 | Ht 69.0 in | Wt 363.0 lb

## 2016-12-23 DIAGNOSIS — G3281 Cerebellar ataxia in diseases classified elsewhere: Secondary | ICD-10-CM | POA: Diagnosis not present

## 2016-12-23 DIAGNOSIS — M21371 Foot drop, right foot: Secondary | ICD-10-CM

## 2016-12-23 DIAGNOSIS — G8929 Other chronic pain: Secondary | ICD-10-CM | POA: Diagnosis not present

## 2016-12-23 DIAGNOSIS — M5441 Lumbago with sciatica, right side: Secondary | ICD-10-CM | POA: Diagnosis not present

## 2016-12-23 MED ORDER — LIDOCAINE 4 % EX GEL
CUTANEOUS | 11 refills | Status: DC
Start: 2016-12-23 — End: 2018-04-06

## 2016-12-23 NOTE — Progress Notes (Signed)
PATIENT: Janet [name] DOB: 11/21/1970  Chief Complaint  Patient presents with  . Foot Drop    She is here for evaluation of right foot drop and numbness/tingling in great toe.  Symptoms started in April or May of this year.  She is currently taking gabapentin 900mg , BID.  Marland Kitchen PCP    Arnoldo Morale, MD     HISTORICAL  Janet [name], is a 46 year old female, seen in refer by her primary care doctor  Arnoldo Morale, for evaluation of right foot drop, initial evaluation was on November twelfth 2018.  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  She noticed that the top of right toe went numb since April 2018, she described right toe numbness tingling, radiating paresthesia to the top of right foot, right lateral leg, also complains of low back pain, radiating pain to her right leg, getting worse after standing up since July 2018, she denies bowel and bladder incontinence, no left leg involvement,  I personally reviewed x-ray in October 2018, degenerative changes of lumbar spine with scoliosis concave to the left, no acute abnormality,  Laboratory evaluation in October 2018, glucose was 105, creatinine 0.7, lipid profile, cholesterol 131, LDL 50, normal CMP with creatinine of 0.69,  decreased TSH 0.17, A1c 6.2, uric acid 4.7, CPK 78  REVIEW OF SYSTEMS: Full 14 system review of systems performed and notable only for numbness, weakness, joint pain, achy muscles, constipations  ALLERGIES: Allergies  Allergen Reactions  . Ciprofloxacin Shortness Of Breath  . Shrimp [Shellfish Allergy]     HOME MEDICATIONS: Current Outpatient Medications  Medication Sig Dispense Refill  . albuterol (PROVENTIL HFA;VENTOLIN HFA) 108 (90 Base) MCG/ACT inhaler Inhale 1-2 puffs into the lungs every 6 (six) hours as needed for wheezing or shortness of breath. 54 g 3  . albuterol (PROVENTIL) (2.5 MG/3ML) 0.083% nebulizer solution Take 3 mLs (2.5 mg total) by nebulization every 6 (six) hours as needed for wheezing or shortness of breath. 75 mL 3  . cetirizine (ZYRTEC) 10 MG tablet Take 10 mg by mouth daily.    . clotrimazole (LOTRIMIN) 1 % cream Apply 1 application topically 2 (two) times daily. 30 g 0  . DULoxetine (CYMBALTA) 60 MG capsule Take 1 capsule (60 mg total) by mouth daily. 30 capsule 3  . ferrous gluconate (FERGON) 324 MG tablet Take 1 tablet (324 mg total) by mouth daily with breakfast. 30 tablet 3  . Fluticasone-Salmeterol (ADVAIR DISKUS) 250-50 MCG/DOSE AEPB Inhale 1 puff into the lungs 2 (two) times daily. 180 each 3  . gabapentin (NEURONTIN) 300 MG capsule Take 3 capsules (900 mg total) by mouth 2 (two) times daily. 180 capsule 3  . ibuprofen (ADVIL,MOTRIN) 600 MG tablet Take 1 tablet (600 mg total) by mouth every 6 (six) hours as needed. 30 tablet 1  . levothyroxine (SYNTHROID, LEVOTHROID) 150 MCG tablet Take 1 tablet (150 mcg total) by  mouth daily before breakfast. 30 tablet 3  . montelukast (SINGULAIR) 10 MG tablet Take 1 tablet (10 mg total) by mouth at bedtime. 90 tablet 1  . pregabalin (LYRICA) 150 MG capsule Take 1 capsule (150 mg total) by mouth 2 (two) times daily. 60 capsule 3  . senna (SENOKOT) 8.6 MG TABS tablet Take 1 tablet (8.6 mg total) by mouth at bedtime. 120 each  0  . sulfamethoxazole-trimethoprim (BACTRIM DS) 800-160 MG tablet Take 1 tablet by mouth 2 (two) times daily. 20 tablet 0   No current facility-administered medications for this visit.     PAST MEDICAL HISTORY: Past Medical History:  Diagnosis Date  . Anemia   . Asthma   . Depression   . Hypothyroidism   . Pre-diabetes   . Right foot drop   . Sleep apnea   . Thyroid disease   . Uterine cancer ([place])     PAST SURGICAL HISTORY: Past Surgical History:  Procedure Laterality Date  . CARPAL TUNNEL RELEASE    . CESAREAN SECTION    . CHOLECYSTECTOMY    . cyst removal     uterus  . DILATION AND CURETTAGE OF UTERUS     still birth  . SINUS EXPLORATION    . WISDOM TOOTH EXTRACTION      FAMILY HISTORY: Family History  Problem Relation Age of Onset  . Heart attack Mother   . Suicidality Father     SOCIAL HISTORY:  Social History   Socioeconomic History  . Marital status: Widowed    Spouse name: Not on file  . Number of children: 1  . Years of education: college  . Highest education level: Not on file  Social Needs  . Financial resource strain: Not on file  . Food insecurity - worry: Not on file  . Food insecurity - inability: Not on file  . Transportation needs - medical: Not on file  . Transportation needs - non-medical: Not on file  Occupational History  . Occupation: Unemployed  Tobacco Use  . Smoking status: Never Smoker  . Smokeless tobacco: Never Used  Substance and Sexual Activity  . Alcohol use: No  . Drug use: No  . Sexual activity: Not Currently    Birth control/protection: None  Other Topics Concern  . Not on file  Social History Narrative   Lives at home with her son.   Right-handed.   2 cups caffeine per day.     PHYSICAL EXAM   Vitals:   12/23/16 1352  BP: 137/75  Pulse: 75  Weight: (!) 363 lb (164.7 kg)  Height: 5\' 9"  (1.753 m)    Not recorded      Body mass index is 53.61 kg/m.  PHYSICAL EXAMNIATION:  Gen: NAD,  conversant, well nourised, obese, well groomed                     Cardiovascular: Regular rate rhythm, no peripheral edema, warm, nontender. Eyes: Conjunctivae clear without exudates or hemorrhage Neck: Supple, no carotid bruits. Pulmonary: Clear to auscultation bilaterally   NEUROLOGICAL EXAM:  MENTAL STATUS: Speech:    Speech is normal; fluent and spontaneous with normal comprehension.  Cognition:     Orientation to time, place and person     Normal recent and remote memory     Normal Attention span and concentration     Normal Language, naming, repeating,spontaneous speech     Fund of knowledge   CRANIAL NERVES: CN II: Visual fields are full  to confrontation. Fundoscopic exam is normal with sharp discs and no vascular changes. Pupils are round equal and briskly reactive to light. CN III, IV, VI: extraocular movement are normal. No ptosis. CN V: Facial sensation is intact to pinprick in all 3 divisions bilaterally. Corneal responses are intact.  CN VII: Face is symmetric with normal eye closure and smile. CN VIII: Hearing is normal to rubbing fingers CN IX, X: Palate elevates symmetrically. Phonation is normal. CN XI: Head turning and shoulder shrug are intact CN XII: Tongue is midline with normal movements and no atrophy.  MOTOR: There is no pronator drift of out-stretched arms. Muscle bulk and tone are normal. She has mild right ankle dorsi flexion, eversion weakness  REFLEXES: Reflexes are 1 and symmetric at the biceps, triceps, knees, and absent at ankles. Plantar responses are flexor.  SENSORY:  She has decreased light touch pinprick at right first web space, extending to top of right foot, right lateral leg.   COORDINATION: Rapid alternating movements and fine finger movements are intact. There is no dysmetria on finger-to-nose and heel-knee-shin.    GAIT/STANCE: Antalgic, she has mild gait abnormality, dragging her right leg, has difficulty with right ankle dorsi  flexion Romberg is absent.   DIAGNOSTIC DATA (LABS, IMAGING, TESTING) - I reviewed patient records, labs, notes, testing and imaging myself where available.   ASSESSMENT AND PLAN  Janet [name] is a 46 y.o. female   Right foot drop, sensory loss at right superficial peroneal and deep peroneal distribution  Potential localization right L5 radiculopathy, versus right sciatic neuropathy  EMG nerve conduction study  MRI of lumbar  Physical therapy  I  Also suggested right ankle brace     Marcial Pacas, M.D. Ph.D.  [facility] [address], [place], [place] [address] Ph: [phone] Fax: [phone]  CC: Arnoldo Morale, MD

## 2017-01-06 ENCOUNTER — Encounter: Payer: Self-pay | Admitting: Gynecologic Oncology

## 2017-01-06 NOTE — Progress Notes (Signed)
Gynecologic Oncology Multi-Disciplinary Disposition Conference Note  Date of the Conference: January 06, 2017  Patient Name: Janet [name]  Referring Provider: Dr. [name] Primary GYN Oncologist: Dr. Everitt Amber  Stage/Disposition:  Stage IA grade 2 endometrial adenocarcinoma.  Disposition is to repeat PET scan 3-6 months from previous to re-evaluate retroperitoneal adenopathy.  This Multidisciplinary conference took place involving physicians from [place], [place], Radiation Oncology, Pathology, Radiology along with the Gynecologic Oncology Nurse Practitioner and RN.  Comprehensive assessment of the patient's malignancy, staging, need for surgery, chemotherapy, radiation therapy, and need for further testing were reviewed. Supportive measures, both inpatient and following discharge were also discussed. The recommended plan of care is documented. Greater than 35 minutes were spent correlating and coordinating this patient's care.

## 2017-01-17 ENCOUNTER — Telehealth: Payer: Self-pay | Admitting: Neurology

## 2017-01-17 ENCOUNTER — Ambulatory Visit (INDEPENDENT_AMBULATORY_CARE_PROVIDER_SITE_OTHER): Payer: Medicaid Other | Admitting: Neurology

## 2017-01-17 ENCOUNTER — Ambulatory Visit: Payer: Medicaid Other | Admitting: Neurology

## 2017-01-17 DIAGNOSIS — M79651 Pain in right thigh: Secondary | ICD-10-CM | POA: Insufficient documentation

## 2017-01-17 DIAGNOSIS — G8929 Other chronic pain: Secondary | ICD-10-CM

## 2017-01-17 DIAGNOSIS — Z0289 Encounter for other administrative examinations: Secondary | ICD-10-CM

## 2017-01-17 DIAGNOSIS — M21371 Foot drop, right foot: Secondary | ICD-10-CM

## 2017-01-17 DIAGNOSIS — G3281 Cerebellar ataxia in diseases classified elsewhere: Secondary | ICD-10-CM

## 2017-01-17 DIAGNOSIS — M5441 Lumbago with sciatica, right side: Secondary | ICD-10-CM

## 2017-01-17 NOTE — Telephone Encounter (Signed)
Please make sure she would have a follow-up appointment early January 2019 after her MRI of lumbar spine, and MRI of the right femoral with without contrast

## 2017-01-17 NOTE — Procedures (Signed)
Full Name: Janet [name] Gender: Female MRN #: [id number] Date of Birth: 09-20-2070    Visit Date: 01/17/17 08:50 Age: 46 Years 44 Months Old Examining Physician: Marcial Pacas, MD  Referring Physician: Krista Blue, MD History: 46 year old female, with history of uterine cancer, presenting with gradual onset right upper foot, lateral leg numbness, right foot drop since April 2018, and continue to have slow progressive worsening.  Summary of the tests:  Nerve conduction study:  Right peroneal sensory responses was present, right peroneal to EDB motor responses were absent.  Left peroneal sensory responses were normal.  Bilateral tibial motor responses were normal.  Left peroneal EDB motor responses were normal.  Electromyography:  Selective needle examinations were performed at right lower extremity muscles, right lumbosacral paraspinals.  There is significant active neuropathic changes involving the right tibialis anterior, mild degree at the right peroneal longus, with likely involvement of right biceps femoris short head.  There is no spontaneous activity at right lumbosacral paraspinal.    Conclusion: This is an abnormal study, there is electrodiagnostic evidence of right L4-L5 radiculopathy.    ------------------------------- Marcial Pacas, M.D.  [facility] [place],  [address] Tel: [phone] Fax: [phone]        [facility]    Nerve / Sites Muscle Latency Ref. Amplitude Ref. Rel Amp Segments Distance Velocity Ref. Area    ms ms mV mV %  cm m/s m/s mVms  R Peroneal - EDB     Ankle EDB NR ?6.5 NR ?2.0 NR Ankle - EDB 9   NR         Pop fossa - Ankle      L Peroneal - EDB     Ankle EDB 5.1 ?6.5 3.4 ?2.0 100 Ankle - EDB 9   11.7     Fib head EDB 12.3  3.2  95.2 Fib head - Ankle 32 44 ?44 11.6     Pop fossa EDB 15.0  2.8  87.7 Pop fossa - Fib head 12 45 ?44 9.7         Pop fossa - Ankle      R Tibial - AH     Ankle AH 5.4 ?5.8 4.7  ?4.0 100 Ankle - AH 9   16.2     Pop fossa AH 14.2  3.8  81 Pop fossa - Ankle 36 41 ?41 14.0  L Tibial - AH     Ankle AH 4.9 ?5.8 5.0 ?4.0 100 Ankle - AH 9   17.8     Pop fossa AH 13.4  4.7  93.4 Pop fossa - Ankle 36 43 ?41 18.5             SNC    Nerve / Sites Rec. Site Peak Lat Ref.  Amp Ref. Segments Distance    ms ms V V  cm  R Sural - Ankle (Calf)     Calf Ankle 3.0 ?4.4 8 ?6 Calf - Ankle 14  L Sural - Ankle (Calf)     Calf Ankle 3.2 ?4.4 9 ?6 Calf - Ankle 14  R Superficial peroneal - Ankle     Lat leg Ankle 3.6 ?4.4 10 ?6 Lat leg - Ankle 14  L Superficial peroneal - Ankle     Lat leg Ankle 3.8 ?4.4 6 ?6 Lat leg - Ankle 14             F  Wave    Nerve F Lat Ref.  ms ms  R Tibial - AH 53.8 ?56.0  L Tibial - AH 52.9 ?56.0         H Reflex    Nerve H Lat Lat Hmax   ms ms   Left Right Ref. Left Right Ref.  Tibial - [place] 37.6 39.8 ?35.0 33.8 34.6 ?35.0         EMG full       EMG Summary Table    Spontaneous MUAP Recruitment  Muscle IA Fib PSW Fasc Other Amp Dur. Poly Pattern  R. Tibialis anterior Increased None 2+ None _______ Increased Increased 1+ Reduced  R. Extensor digitorum brevis Increased None None None _______ Increased Normal Normal Reduced  R. Peroneus longus Increased None None None _______ Increased Normal Normal Reduced  R. Tibialis posterior Increased None None None _______ Normal Normal Normal Normal  R. Gastrocnemius (Medial head) Normal None None None _______ Normal Normal Normal Normal  R. Vastus medialis Normal None None None _______ Normal Normal Normal Normal  R. Adductor magnus Normal None None None _______ Normal Normal Normal Normal  R. Biceps femoris (short head) Increased None None None _______ Normal Normal Normal Reduced  R. Biceps femoris (long head) Normal None None None _______ Normal Normal Normal Normal  R. Gluteus medius Normal None None None _______ Normal Normal Normal Normal  R. Lumbar paraspinals (low) Normal None None None  _______ Normal Normal Normal Normal

## 2017-01-18 ENCOUNTER — Ambulatory Visit
Admission: RE | Admit: 2017-01-18 | Discharge: 2017-01-18 | Disposition: A | Discharge status: Home / Self Care | Payer: Medicaid Other | Source: Ambulatory Visit | Attending: Neurology | Admitting: Neurology

## 2017-01-18 DIAGNOSIS — G3281 Cerebellar ataxia in diseases classified elsewhere: Secondary | ICD-10-CM | POA: Diagnosis not present

## 2017-01-19 NOTE — Telephone Encounter (Signed)
Left detailed message for patient (ok per DPR) - letting her know her follow up appt has been moved to 02/20/17 at 1pm (arrived to office at 12:45pm).  Provided our number to call back if this time is not convenient.  Also, she is aware to expect a call from [facility] to schedule her right femur MRI or she may call them at [phone].

## 2017-01-22 ENCOUNTER — Ambulatory Visit: Payer: Medicaid Other | Admitting: Physical Therapy

## 2017-01-27 ENCOUNTER — Ambulatory Visit: Payer: Medicaid Other | Admitting: Neurology

## 2017-01-27 ENCOUNTER — Telehealth: Payer: Self-pay | Admitting: Neurology

## 2017-01-27 ENCOUNTER — Encounter: Payer: Self-pay | Admitting: Gynecologic Oncology

## 2017-01-27 ENCOUNTER — Ambulatory Visit: Payer: Medicaid Other | Attending: Gynecologic Oncology | Admitting: Gynecologic Oncology

## 2017-01-27 VITALS — BP 134/63 | HR 84 | Temp 99.2°F | Resp 18 | Ht 69.0 in | Wt 370.3 lb

## 2017-01-27 DIAGNOSIS — Z79899 Other long term (current) drug therapy: Secondary | ICD-10-CM | POA: Diagnosis not present

## 2017-01-27 DIAGNOSIS — Z90710 Acquired absence of both cervix and uterus: Secondary | ICD-10-CM

## 2017-01-27 DIAGNOSIS — E669 Obesity, unspecified: Secondary | ICD-10-CM | POA: Insufficient documentation

## 2017-01-27 DIAGNOSIS — R599 Enlarged lymph nodes, unspecified: Secondary | ICD-10-CM

## 2017-01-27 DIAGNOSIS — J45909 Unspecified asthma, uncomplicated: Secondary | ICD-10-CM | POA: Diagnosis not present

## 2017-01-27 DIAGNOSIS — G4730 Sleep apnea, unspecified: Secondary | ICD-10-CM | POA: Insufficient documentation

## 2017-01-27 DIAGNOSIS — Z8542 Personal history of malignant neoplasm of other parts of uterus: Secondary | ICD-10-CM

## 2017-01-27 DIAGNOSIS — Z6841 Body Mass Index (BMI) 40.0 and over, adult: Secondary | ICD-10-CM | POA: Diagnosis not present

## 2017-01-27 DIAGNOSIS — R7303 Prediabetes: Secondary | ICD-10-CM | POA: Insufficient documentation

## 2017-01-27 DIAGNOSIS — C541 Malignant neoplasm of endometrium: Secondary | ICD-10-CM | POA: Insufficient documentation

## 2017-01-27 DIAGNOSIS — F329 Major depressive disorder, single episode, unspecified: Secondary | ICD-10-CM | POA: Insufficient documentation

## 2017-01-27 DIAGNOSIS — E039 Hypothyroidism, unspecified: Secondary | ICD-10-CM | POA: Insufficient documentation

## 2017-01-27 NOTE — Telephone Encounter (Signed)
Dr. Ernestine Mcmurray will contact Dr. Krista Blue this morning at 9:45am to complete this peer-to-peer review.  He will try her mobile number first then the office number, if needed.

## 2017-01-27 NOTE — Telephone Encounter (Signed)
Janet [name] with [facility] imaging emailed me and informed me that medicaid did not approve the MR Femur. The phone number for the peer to peer is [phone] and the case number is [id number]. She is schedule to have her MRI done on Saturday 02/01/17.

## 2017-01-27 NOTE — Progress Notes (Signed)
Consult Note: Gyn-Onc  Consult was requested by Dr. [name] for the evaluation of Janet [name] 46 y.o. female  CC:  Chief Complaint  Patient presents with  . Endometrial cancer [facility])    Assessment/Plan:  Janet [name] is a 46 y.o.  with stage IA grade 2 endometrial cancer s/p robotic hysterectomy, BSO on 9/37/16 complicated by post op wound separation.  Endometrial cancer: Pathology revealed low risk factors for recurrence, therefore no adjuvant therapy is recommended according to NCCN guidelines.  I discussed risk for recurrence and typical symptoms encouraged her to notify us of these should they develop between visits.  I recommend she have follow-up every 6 months for 5 years in accordance with NCCN guidelines. Those visits should include symptom assessment, physical exam and pelvic examination. Pap smears are not indicated or recommended in the routine surveillance of endometrial cancer.  Enlarged aortocaval nodes - repeat PET imaging in February to re-evaluate for progression.   HPI: Janet [name] is a 46 y.o.  G2P1 with history of irregular menses.  Janet [name] presented To Dr. [name] in  with a report of bleeding daily for almost a month. Janet [name] was unable to tolerate an office examination and thus she underwent hysteroscopy D&C on 06/11/2016.  Pathology was notable for complex atypical hyperplasia with a few small foci suspicious for early/microscopic adenocarcinoma. She presented to the emergency room on May 6 and was diagnosed with endometritis. She's currently receiving course of Flagyl which will be completed on 06/23/2016.  There is no family history of gynecologic GI or breast malignancies.    Interval Hx:  On 07/09/16 she underwent robotic assisted total hysterectomy, BSO for a uterus >250 (336)gm with minilaparotomy for specimen delivery.  Final pathology revealed a 2.4cm grade 2 endometrioid tumor with no myometrial invasion or LVSI.  Lymphadenectomy was not performed due to extreme abdominal adiposity (BMI 56) and preoperative preinvasive diagnosis. She was determined to have low risk features in her pathology and therefore no adjuvant therapy was recommended in accordance with NCCN guidelines.  Postop week 1 her minilaparotomy wound opened (no cellulitis) and closed via secondary intention.  On post-op imaging (CT) on 11/21/16 (due to inability to stage because of obesity) she had findings of Interval increase in size of a now mildly enlarged retroperitoneal lymph node in the aortocaval space. Close attention in this region on follow-up recommended as metastatic disease is a concern. 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  PET/CT was ordered and performed on 12/04/16 which showed aortocaval adenopathy measuring 1.5cm. Non-PET avid. The images were reviewed at tumor board and felt to not be likely metastatic disease therefore observation and repeat imaging was recommended.  Review of Systems:  Constitutional  Feels well,   Cardiovascular  No chest pain, shortness of breath, or edema  Pulmonary  No cough or wheeze.  Gastro Intestinal  No nausea, vomitting, or diarrhoea. No bright red blood per rectum, no abdominal pain, no change in bowel movement, or constipation.  Genito Urinary  No frequency, urgency, dysuria, vaginal spotting Musculo Skeletal  No myalgia, arthralgia, joint swelling or pain  Neurologic  No weakness, numbness, change in gait,  Psychology  No depression, anxiety from diagnoses and social situation  Current Meds:  Outpatient Encounter Medications as of 01/27/2017  Medication Sig  . albuterol (PROVENTIL HFA;VENTOLIN HFA) 108 (90 Base) MCG/ACT inhaler Inhale 1-2 puffs into the lungs every 6 (six) hours as needed for wheezing  or shortness of breath.  Marland Kitchen albuterol (PROVENTIL) (2.5 MG/3ML) 0.083% nebulizer  solution Take 3 mLs (2.5 mg total) by nebulization every 6 (six) hours as needed for wheezing or shortness of breath.  . cetirizine (ZYRTEC) 10 MG tablet Take 10 mg by mouth daily.  . DULoxetine (CYMBALTA) 60 MG capsule Take 1 capsule (60 mg total) by mouth daily.  . Fluticasone-Salmeterol (ADVAIR DISKUS) 250-50 MCG/DOSE AEPB Inhale 1 puff into the lungs 2 (two) times daily.  Marland Kitchen gabapentin (NEURONTIN) 300 MG capsule Take 3 capsules (900 mg total) by mouth 2 (two) times daily.  Marland Kitchen ibuprofen (ADVIL,MOTRIN) 600 MG tablet Take 1 tablet (600 mg total) by mouth every 6 (six) hours as needed.  Marland Kitchen levothyroxine (SYNTHROID, LEVOTHROID) 150 MCG tablet Take 1 tablet (150 mcg total) by mouth daily before breakfast.  . Lidocaine 4 % GEL Apply to right toe as needed.  . montelukast (SINGULAIR) 10 MG tablet Take 1 tablet (10 mg total) by mouth at bedtime.  . [DISCONTINUED] clotrimazole (LOTRIMIN) 1 % cream Apply 1 application topically 2 (two) times daily.  . [DISCONTINUED] ferrous gluconate (FERGON) 324 MG tablet Take 1 tablet (324 mg total) by mouth daily with breakfast.  . [DISCONTINUED] pregabalin (LYRICA) 150 MG capsule Take 1 capsule (150 mg total) by mouth 2 (two) times daily.  . [DISCONTINUED] senna (SENOKOT) 8.6 MG TABS tablet Take 1 tablet (8.6 mg total) by mouth at bedtime.  . [DISCONTINUED] sulfamethoxazole-trimethoprim (BACTRIM DS) 800-160 MG tablet Take 1 tablet by mouth 2 (two) times daily.   No facility-administered encounter medications on file as of 01/27/2017.     Allergy:  Allergies  Allergen Reactions  . Ciprofloxacin Shortness Of Breath  . Shrimp [Shellfish Allergy]     Social Hx:   Social History   Socioeconomic History  . Marital status: Widowed    Spouse name: Not on file  . Number of children: 1  . Years of education: college  . Highest education level: Not on file  Social Needs  . Financial resource strain: Not on file  . Food insecurity - worry: Not on file  . Food  insecurity - inability: Not on file  . Transportation needs - medical: Not on file  . Transportation needs - non-medical: Not on file  Occupational History  . Occupation: Unemployed  Tobacco Use  . Smoking status: Never Smoker  . Smokeless tobacco: Never Used  Substance and Sexual Activity  . Alcohol use: No  . Drug use: No  . Sexual activity: Not Currently    Birth control/protection: None  Other Topics Concern  . Not on file  Social History Narrative   Lives at home with her son.   Right-handed.   2 cups caffeine per day.   Husband was diagnosed with leukemia in January 2018 with a life expectancy of approximately 6 months. At this time he is receiving supportive care and regularly is managed with blood and platelet transfusions.  Regular home nursing visits are in place.   Maebell Lyvers denies history of sexual or physical abuse.  Past Surgical Hx:  Past Surgical History:  Procedure Laterality Date  . CARPAL TUNNEL RELEASE    . CESAREAN SECTION    . CHOLECYSTECTOMY    . cyst removal     uterus  . DILATION AND CURETTAGE OF UTERUS     still birth  . DILATION AND CURETTAGE OF UTERUS N/A 06/11/2016   Procedure: DILATATION AND CURETTAGE;  Surgeon: Emily Filbert, MD;  Location: [facility];  Service: Gynecology;  Laterality: N/A;  . ROBOTIC ASSISTED TOTAL HYSTERECTOMY WITH BILATERAL SALPINGO OOPHERECTOMY Bilateral 07/09/2016   Procedure: XI ROBOTIC ASSISTED TOTAL HYSTERECTOMY WITH BILATERAL SALPINGO OOPHORECTOMY FOR UTERUS GREATER THAN 250 GRAMS, LYSIS OF ADHESIONS;  Surgeon: Everitt Amber, MD;  Location: [facility];  Service: Gynecology;  Laterality: Bilateral;  . SINUS EXPLORATION    . WISDOM TOOTH EXTRACTION      Past Medical Hx:  Past Medical History:  Diagnosis Date  . Anemia   . Asthma   . Depression   . Hypothyroidism   . Pre-diabetes   . Right foot drop   . Sleep apnea   . Thyroid disease   . Uterine cancer [facility])     Past Gynecological History:  Gravida 2 para 1  menarche at 84 with irregular menses since last Pap 06/11/2016 within normal limits no history of abnormal Pap test reports history of OCP use in her 54s  Family Hx:  Family History  Problem Relation Age of Onset  . Heart attack Mother   . Suicidality Father     Vitals:  Blood pressure 134/63, pulse 84, temperature 99.2 F (37.3 C), temperature source Oral, resp. rate 18, height 5\' 9"  (1.753 m), weight (!) 370 lb 4.8 oz (168 kg), SpO2 97 %. Body mass index is 54.68 kg/m. Wt Readings from Last 3 Encounters:  01/27/17 (!) 370 lb 4.8 oz (168 kg)  12/23/16 (!) 363 lb (164.7 kg)  11/18/16 (!) 362 lb 6.4 oz (164.4 kg)    Physical Exam: WD in NAD Neck  Supple NROM, without any enlargements.  Lymph Node Survey No cervical supraclavicular or inguinal adenopathy Cardiovascular  Pulse normal rate, regularity and rhythm. S1 and S2 normal.  Lungs  Clear to auscultation bilaterally, without wheezes/crackles/rhonchi. Good air movement.  Skin  No rash/lesions/breakdown  Psychiatry  Alert and oriented appropriate mood affect speech and reasoning. Abdomen  Normoactive bowel sounds, abdomen soft, non-tender. large pannus, incision closed and no evidence of hernia Back No CVA tenderness Genito Urinary  : normal vaginal cuff - no palpable masses or cuff lesions. Rectal  deferred  Extremities  No bilateral cyanosis, clubbing or edema.  [name], MD, PhD 01/27/2017, 2:56 PM

## 2017-01-27 NOTE — Patient Instructions (Signed)
Please notify Dr Denman George at phone number [phone] if you notice vaginal bleeding, new pelvic or abdominal pains, bloating, feeling full easy, or a change in bladder or bowel function.   Dr Denman George will order a PET scan for February and will see you again in March.

## 2017-01-30 ENCOUNTER — Ambulatory Visit: Payer: Medicaid Other | Attending: Family Medicine

## 2017-01-30 ENCOUNTER — Telehealth: Payer: Self-pay | Admitting: Neurology

## 2017-01-30 ENCOUNTER — Telehealth: Payer: Self-pay | Admitting: *Deleted

## 2017-01-30 DIAGNOSIS — R208 Other disturbances of skin sensation: Secondary | ICD-10-CM | POA: Diagnosis present

## 2017-01-30 DIAGNOSIS — M6281 Muscle weakness (generalized): Secondary | ICD-10-CM | POA: Insufficient documentation

## 2017-01-30 DIAGNOSIS — G8929 Other chronic pain: Secondary | ICD-10-CM | POA: Diagnosis present

## 2017-01-30 DIAGNOSIS — Z0271 Encounter for disability determination: Secondary | ICD-10-CM

## 2017-01-30 DIAGNOSIS — M5441 Lumbago with sciatica, right side: Secondary | ICD-10-CM | POA: Insufficient documentation

## 2017-01-30 DIAGNOSIS — R2689 Other abnormalities of gait and mobility: Secondary | ICD-10-CM | POA: Diagnosis present

## 2017-01-30 MED ORDER — MELOXICAM 15 MG PO TABS: 15.0000 mg | ORAL_TABLET | Freq: Every day | ORAL | 2 refills | Status: DC

## 2017-01-30 MED ORDER — OXCARBAZEPINE 150 MG PO TABS: 150.0000 mg | ORAL_TABLET | Freq: Two times a day (BID) | ORAL | 2 refills | Status: DC

## 2017-01-30 NOTE — Telephone Encounter (Signed)
Noted, thank you

## 2017-01-30 NOTE — Telephone Encounter (Signed)
Patient is having continued pain despite current medications.  She has MRI and follow up scheduled with Dr. Krista Blue.  She plans to proceed with PT.  Dr. Krista Blue has reviewed her chart, including current medications.  Per vo by Dr. Krista Blue, provide the following:  1) Mobic 15mg , one tablet daily 2) Trileptal 150mg , one tablet twice daily.  Dr. Krista Blue has also provided her with a temporary handicap placard that will be valid for one month.  Spoke to patient - she is agreeable to this plan.  Prescriptions have been sent to her pharmacy.  She requested the handicap placard application be placed in the mail to her home address today.

## 2017-01-30 NOTE — Telephone Encounter (Signed)
MRI of right femur A [id number] w/wo

## 2017-01-30 NOTE — Telephone Encounter (Signed)
Patient stopped by to request some pain medication.  Pain is getting worse.  Also wants to see if she can get a handicap placard?  Please  Call.

## 2017-01-30 NOTE — Therapy (Signed)
[facility] [address] [place], [place], [address] Phone: [phone]   Fax:  [phone]  Physical Therapy Evaluation  Patient Details  Name: Janet [name] MRN: [id number] Date of Birth: 1970/08/26 Referring Provider: Dr. Krista Blue   Encounter Date: 01/30/2017  PT End of Session - 01/30/17 1342    Visit Number  1    Number of Visits  4    Authorization Type  Medicaid (waiting on approval)    PT Start Time  0932    PT Stop Time  1012    PT Time Calculation (min)  40 min    Equipment Utilized During Treatment  -- min A to S prn    Activity Tolerance  Patient tolerated treatment well    Behavior During Therapy  [facility] for tasks assessed/performed       Past Medical History:  Diagnosis Date  . Anemia   . Asthma   . Depression   . Hypothyroidism   . Pre-diabetes   . Right foot drop   . Sleep apnea   . Thyroid disease   . Uterine cancer [facility])     Past Surgical History:  Procedure Laterality Date  . CARPAL TUNNEL RELEASE    . CESAREAN SECTION    . CHOLECYSTECTOMY    . cyst removal     uterus  . DILATION AND CURETTAGE OF UTERUS     still birth  . DILATION AND CURETTAGE OF UTERUS N/A 06/11/2016   Procedure: DILATATION AND CURETTAGE;  Surgeon: Emily Filbert, MD;  Location: [facility];  Service: Gynecology;  Laterality: N/A;  . ROBOTIC ASSISTED TOTAL HYSTERECTOMY WITH BILATERAL SALPINGO OOPHERECTOMY Bilateral 07/09/2016   Procedure: XI ROBOTIC ASSISTED TOTAL HYSTERECTOMY WITH BILATERAL SALPINGO OOPHORECTOMY FOR UTERUS GREATER THAN 250 GRAMS, LYSIS OF ADHESIONS;  Surgeon: Everitt Amber, MD;  Location: [facility];  Service: Gynecology;  Laterality: Bilateral;  . SINUS EXPLORATION    . WISDOM TOOTH EXTRACTION      There were no vitals filed for this visit.   Subjective Assessment - 01/30/17 0938    Subjective  Pt reported R foot drop began in 05/2016, with hx of R sided chronic LBP with sciatic pain. She woke up with N/T in R  foot and was told she has diabetic neuropathy. She has N/T in B feet. Pt denied falls in the last 6 months but does trip often. Pt can't walk long distances 2/2 R foot drop and LBP.     Pertinent History  Asthma, hypothyroidism, diabetic neuropathy (N/T starting below knee to feet), chronic R LBP with sciatic, hx endometrial CA (MD just cleared pt) with hysterectomy, thrombocytopenia, depression, anemia, sleep apnea    Patient Stated Goals  To walk longer distances and without pain (and normal)    Currently in Pain?  Yes    Pain Score  5     Pain Location  Toe (Comment which one) Great toe    Pain Orientation  Right    Pain Descriptors / Indicators  Pins and needles;Tingling;Numbness    Pain Onset  More than a month ago    Pain Frequency  Constant    Aggravating Factors   driving, walking, standing     Pain Relieving Factors  elevate foot, friction from socks          [facility] PT Assessment - 01/30/17 0946      Assessment   Medical Diagnosis  R foot drop, chronic R sided LBP with R sided sciatica, cerebellar  ataxia in diseases classified elsewhere    Referring Provider  Dr. Krista Blue    Onset Date/Surgical Date  05/26/16    Hand Dominance  Right    Prior Therapy  none for R foot drop      Precautions   Precautions  Fall    Precaution Comments  based on DGI score and TUG time.       Restrictions   Weight Bearing Restrictions  No      Balance Screen   Has the patient fallen in the past 6 months  No    Has the patient had a decrease in activity level because of a fear of falling?   Yes    Is the patient reluctant to leave their home because of a fear of falling?   Yes      [place] residence    Living Arrangements  Children 56 y/o son    Available Help at Discharge  Family;Friend(s)    Type of [place] to enter    Entrance Stairs-Number of Steps  1    Entrance Stairs-Rails  None    Home Layout  One level    [place] - quad    Additional Comments  Pt husband passed away in 2016-07-25 (due to CA).      Prior Function   Level of Independence  Independent    Vocation  On disability    Leisure  Taking her son out for fun (bowling, to the park), put puzzles together      Cognition   Overall Cognitive Status  Within Functional Limits for tasks assessed      Sensation   Light Touch  Impaired by gross assessment    Additional Comments  Pt reported N/T in B feet and decr. sensation in RLE.      ROM / Strength   AROM / PROM / Strength  AROM;Strength      AROM   Overall AROM   Deficits    Overall AROM Comments  BUE and BLE AROM WFL except for decr. R ankle DF (able to perform approx. 5 degrees)      Strength   Overall Strength  Deficits    Overall Strength Comments  B UE WFL. BLE grossly 4+/5, except for R DF: 2/5. B hip ext not tested but weakness suspected 2/2 gait deviations.       Transfers   Transfers  Sit to Stand;Stand to Sit    Sit to Stand  5: Supervision;With upper extremity assist;From chair/3-in-1    Stand to Sit  5: Supervision;With upper extremity assist;To chair/3-in-1      Ambulation/Gait   Ambulation/Gait  Yes    Ambulation/Gait Assistance  5: Supervision    Ambulation/Gait Assistance Details  No overt LOB but pt noted to experience incr. postural sway with turns.    Ambulation Distance (Feet)  100 Feet    Assistive device  None    Gait Pattern  Step-through pattern;Decreased stride length;Decreased weight shift to right;Wide base of support;Decreased trunk rotation;Right foot flat;Antalgic    Ambulation Surface  Level;Indoor    Gait velocity  2.73ft/sec.  no AD      Standardized Balance Assessment   Standardized Balance Assessment  Timed Up and Go Test;Dynamic Gait Index      Dynamic Gait Index   Level Surface  Mild Impairment    Change in Gait Speed  Moderate Impairment    Gait with Horizontal Head Turns  Mild Impairment    Gait with Vertical Head Turns  Mild  Impairment    Gait and Pivot Turn  Moderate Impairment    Step Over Obstacle  Severe Impairment    Step Around Obstacles  Mild Impairment    Steps  Moderate Impairment    Total Score  11    DGI comment:  11/24: indicates pt is at high falls risk.       Timed Up and Go Test   TUG  Normal TUG    Normal TUG (seconds)  15.88 no AD             Objective measurements completed on examination: See above findings.              PT Education - 01/30/17 0946    Education provided  Yes    Education Details  PT discussed outcome measure results. PT educated pt on PT POC, duration, and frequency and the [employer] process (auth required).    Person(s) Educated  Patient    Methods  Explanation    Comprehension  Verbalized understanding       PT Short Term Goals - 01/30/17 1359      PT SHORT TERM GOAL #1   Title  Pt will be IND in HEP to improve strength, balance, endurance, and flexibililty TARGET FOR ALL STGS: 3 TREATMENT SESSIONS.    Baseline  No HEP    Status  New      PT SHORT TERM GOAL #2   Title  Trial R AFO and determine best AD for amb. and safety during mobility.     Baseline  No AFO    Status  New      PT SHORT TERM GOAL #3   Title  Pt will improve gait speed with LRAD and R AFO/brace to >/=2.62 ft/sec. to safely amb. in the community.     Baseline  2.31ft/sec no AD or AFO    Status  New        PT Long Term Goals - 01/30/17 1450      PT LONG TERM GOAL #1   Title  Pt will be IND in progressed HEP to improve strength, balance, endurance, and flexibililty. TARGET DATE FOR ALL LTGS: 03/27/17    Baseline  No HEP    Status  New      PT LONG TERM GOAL #2   Title  Pt will amb. 600' over even and paved surfaces with LRAD and AFO, at MOD I level, in order to improve functional mobility and to care for son.     Baseline  100' with S for safety, over even terrain.    Status  New      PT LONG TERM GOAL #3   Title  Pt will improve DGI score to >/=20/24  with AFO/brace donned to decr. falls risk.     Baseline  11/24    Status  New      PT LONG TERM GOAL #4   Title  Pt will perform TUG in </=13.5 sec. with LRAD and AFO to decr. falls risk.     Baseline  15.88 sec. no AD    Status  New             Plan - 01/30/17 1346    Clinical Impression Statement  Pt is a pleasant 46y/o female presenting to OPPT neuro for R foot drop, chronic R  sided low back pain with sciatica, and cerebellar ataxia in diseases classified elsewhere. Per MD note, test findings consistent with L4-L5 radiculopathy and neuropathic changes in R tibialis anterior and R peroneal longus, and likely R biceps femoris-short head. Pt's PMH siginificant for the following: hx of uterine CA s/p hysterectomy 2016-07-16), asthma, hypothyroidism, DM, diabetic neuropathy, R LBP with sciatica, thrombocytopenia, depression, anemia, and sleep apnea. The following deficits were noted upon exam: gait deviations, impaired balance, decr. ROM, decr. strength, impaired flexibility, pain, incr. body habitus, impaired sensation, and decr. endurance. Pt's gait speed indicates pt is unable to safely ambulate in the community. Pt's DGI score and TUG time indicate pt is at risk for falls. PT requesting eval plus 3 treatments, and will then likely submit for more visits based on pt's goal progress. Pt would benefit from skilled PT to improve safety during functional mobility.    History and Personal Factors relevant to plan of care:  Pt's has a 48 year old, and is now a single mother as her husband passed away from CA in 2016/07/16    Clinical Presentation  Stable    Clinical Presentation due to:  Asthma, hypothyroidism, diabetic neuropathy (N/T starting below knee to feet), chronic R LBP with sciatic, hx endometrial CA (MD just cleared pt) with hysterectomy, thrombocytopenia, depression, anemia, sleep apnea    Clinical Decision Making  Moderate    Rehab Potential  Good    Clinical Impairments Affecting Rehab  Potential  see above.    PT Frequency  -- 1x/week for 3 weeks plus eval (based on Medicaid)    PT Treatment/Interventions  ADLs/Self Care Home Management;Biofeedback;Electrical Stimulation;Therapeutic activities;Therapeutic exercise;Manual techniques;Vestibular;Functional mobility training;Stair training;Gait training;Patient/family education;Orthotic Fit/Training;DME Instruction;Neuromuscular re-education;Balance training    PT Next Visit Plan  Provide pt with strengthening, balance, flexibility and walking program. Trial R AFO    Consulted and Agree with Plan of Care  Patient       Patient will benefit from skilled therapeutic intervention in order to improve the following deficits and impairments:  Abnormal gait, Decreased endurance, Impaired sensation, Decreased knowledge of use of DME, Decreased strength, Obesity, Decreased balance, Decreased mobility, Impaired flexibility, Postural dysfunction, Decreased range of motion  Visit Diagnosis: Other abnormalities of gait and mobility - Plan: PT plan of care cert/re-cert  Other disturbances of skin sensation - Plan: PT plan of care cert/re-cert  Muscle weakness (generalized) - Plan: PT plan of care cert/re-cert  Chronic right-sided low back pain with right-sided sciatica - Plan: PT plan of care cert/re-cert     Problem List Patient Active Problem List   Diagnosis Date Noted  . Right thigh pain 01/17/2017  . Right foot drop 12/23/2016  . Chronic right-sided low back pain with right-sided sciatica 12/23/2016  . Pedal edema 10/07/2016  . Endometrial cancer ([place]) 08/05/2016  . Superficial postoperative wound infection 07/22/2016  . Complex atypical endometrial hyperplasia 2016-07-16  . Diabetic neuropathy ([place]) 05/27/2016  . Anemia 05/08/2016  . DUB (dysfunctional uterine bleeding) 05/08/2016  . Type 2 diabetes mellitus ([place]) 02/16/2016  . Morbid obesity ([place]) 04/12/2015  . Depression 04/12/2015  . Asthma 03/30/2015  . Hypoxemia  03/30/2015  . Hypothyroidism 03/30/2015  . Thrombocytopenia ([place]) 03/30/2015  . Hypokalemia 03/30/2015    Khalifa Knecht L 01/30/2017, 3:09 PM  [facility] [address] [place], [place], [address] Phone: [phone]   Fax:  [phone]  Name: Janet [name] MRN: [id number] Date of Birth: Oct 15, 1970  Geoffry Paradise, PT,DPT 01/30/17 3:10 PM Phone:  [phone] Fax: [phone]

## 2017-01-30 NOTE — Telephone Encounter (Signed)
  MRI of right femur A [id number] w/wo

## 2017-01-30 NOTE — Telephone Encounter (Signed)
Dr. Dante Gang will call today at 1:45pm to complete this peer-to-peer.  Provided Dr. Rhea Belton mobile number as primary contact and office number as secondary contact.

## 2017-01-31 DIAGNOSIS — I10 Essential (primary) hypertension: Secondary | ICD-10-CM

## 2017-02-01 ENCOUNTER — Other Ambulatory Visit: Payer: Medicaid Other

## 2017-02-09 ENCOUNTER — Ambulatory Visit
Admission: RE | Admit: 2017-02-09 | Discharge: 2017-02-09 | Disposition: A | Discharge status: Home / Self Care | Payer: Medicaid Other | Source: Ambulatory Visit | Attending: Neurology | Admitting: Neurology

## 2017-02-09 DIAGNOSIS — M21371 Foot drop, right foot: Secondary | ICD-10-CM

## 2017-02-09 DIAGNOSIS — M79651 Pain in right thigh: Secondary | ICD-10-CM

## 2017-02-09 MED ORDER — GADOBENATE DIMEGLUMINE 529 MG/ML IV SOLN
20.0000 mL | Freq: Once | INTRAVENOUS | Status: AC | PRN
  Administered 2017-02-09: 20 mL via INTRAVENOUS

## 2017-02-14 DIAGNOSIS — Z0289 Encounter for other administrative examinations: Secondary | ICD-10-CM

## 2017-02-18 ENCOUNTER — Encounter: Payer: Self-pay | Admitting: Pharmacist

## 2017-02-18 ENCOUNTER — Ambulatory Visit: Payer: Medicaid Other | Attending: Family Medicine | Admitting: Family Medicine

## 2017-02-18 ENCOUNTER — Encounter: Payer: Self-pay | Admitting: Family Medicine

## 2017-02-18 VITALS — BP 145/79 | HR 73 | Temp 98.0°F | Ht 69.0 in | Wt 381.8 lb

## 2017-02-18 DIAGNOSIS — J4540 Moderate persistent asthma, uncomplicated: Secondary | ICD-10-CM | POA: Insufficient documentation

## 2017-02-18 DIAGNOSIS — E039 Hypothyroidism, unspecified: Secondary | ICD-10-CM

## 2017-02-18 DIAGNOSIS — M21371 Foot drop, right foot: Secondary | ICD-10-CM | POA: Diagnosis not present

## 2017-02-18 DIAGNOSIS — C541 Malignant neoplasm of endometrium: Secondary | ICD-10-CM | POA: Diagnosis not present

## 2017-02-18 DIAGNOSIS — M5416 Radiculopathy, lumbar region: Secondary | ICD-10-CM | POA: Insufficient documentation

## 2017-02-18 DIAGNOSIS — I10 Essential (primary) hypertension: Secondary | ICD-10-CM | POA: Insufficient documentation

## 2017-02-18 DIAGNOSIS — E1140 Type 2 diabetes mellitus with diabetic neuropathy, unspecified: Secondary | ICD-10-CM | POA: Insufficient documentation

## 2017-02-18 DIAGNOSIS — Z90722 Acquired absence of ovaries, bilateral: Secondary | ICD-10-CM | POA: Insufficient documentation

## 2017-02-18 DIAGNOSIS — F330 Major depressive disorder, recurrent, mild: Secondary | ICD-10-CM | POA: Diagnosis not present

## 2017-02-18 DIAGNOSIS — E1149 Type 2 diabetes mellitus with other diabetic neurological complication: Secondary | ICD-10-CM | POA: Diagnosis not present

## 2017-02-18 DIAGNOSIS — R030 Elevated blood-pressure reading, without diagnosis of hypertension: Secondary | ICD-10-CM | POA: Diagnosis not present

## 2017-02-18 DIAGNOSIS — Z79899 Other long term (current) drug therapy: Secondary | ICD-10-CM | POA: Insufficient documentation

## 2017-02-18 DIAGNOSIS — Z8542 Personal history of malignant neoplasm of other parts of uterus: Secondary | ICD-10-CM | POA: Diagnosis not present

## 2017-02-18 DIAGNOSIS — G4730 Sleep apnea, unspecified: Secondary | ICD-10-CM | POA: Diagnosis not present

## 2017-02-18 DIAGNOSIS — E118 Type 2 diabetes mellitus with unspecified complications: Secondary | ICD-10-CM

## 2017-02-18 DIAGNOSIS — R600 Localized edema: Secondary | ICD-10-CM

## 2017-02-18 DIAGNOSIS — Z90710 Acquired absence of both cervix and uterus: Secondary | ICD-10-CM | POA: Insufficient documentation

## 2017-02-18 DIAGNOSIS — Z9079 Acquired absence of other genital organ(s): Secondary | ICD-10-CM | POA: Diagnosis not present

## 2017-02-18 DIAGNOSIS — E119 Type 2 diabetes mellitus without complications: Secondary | ICD-10-CM | POA: Diagnosis present

## 2017-02-18 LAB — POCT GLYCOSYLATED HEMOGLOBIN (HGB A1C): Hemoglobin A1C: 6.8

## 2017-02-18 LAB — GLUCOSE, POCT (MANUAL RESULT ENTRY): POC Glucose: 146 mg/dl — AB (ref 70–99)

## 2017-02-18 MED ORDER — FLUTICASONE-SALMETEROL 250-50 MCG/DOSE IN AEPB: 1.0000 | INHALATION_SPRAY | Freq: Two times a day (BID) | RESPIRATORY_TRACT | 3 refills | Status: DC

## 2017-02-18 MED ORDER — ALBUTEROL SULFATE (2.5 MG/3ML) 0.083% IN NEBU: 2.5000 mg | INHALATION_SOLUTION | Freq: Four times a day (QID) | RESPIRATORY_TRACT | 3 refills | Status: DC | PRN

## 2017-02-18 MED ORDER — DULOXETINE HCL 60 MG PO CPEP: 60.0000 mg | ORAL_CAPSULE | Freq: Every day | ORAL | 3 refills | Status: DC

## 2017-02-18 MED ORDER — PREGABALIN 75 MG PO CAPS: 75.0000 mg | ORAL_CAPSULE | Freq: Two times a day (BID) | ORAL | 3 refills | Status: DC

## 2017-02-18 MED ORDER — MONTELUKAST SODIUM 10 MG PO TABS: 10.0000 mg | ORAL_TABLET | Freq: Every day | ORAL | 1 refills | Status: DC

## 2017-02-18 MED ORDER — FUROSEMIDE 20 MG PO TABS: 20.0000 mg | ORAL_TABLET | Freq: Every day | ORAL | 0 refills | Status: DC

## 2017-02-18 NOTE — Progress Notes (Signed)
PA submitted and approved for [employer]. Approval #[id number]

## 2017-02-18 NOTE — Progress Notes (Signed)
Subjective:  Patient ID: Janet [name], female    DOB: 04-22-1970  Age: 47 y.o. MRN: [id number]  CC: Diabetes and Hypothyroidism   HPI Janet [name] presents is a 47 year old female with a history of type 2 diabetes mellitus (diet controlled with A1c of 6.8), Diabetic neuropathy, hypothyroidism, asthma, Seasonal allergies, endometrial adenocarcinoma (s/pTotal abdominal hysterectomy and bilateral salpingo-oophorectomy in 06/2016, adjuvant treatment due to low risk for recurrence),  morbid obesity who presents today for follow-up visit.  She complains of right foot swelling and pain which she noticed over the last few days and also endorses weight gain (she has gained 19 pounds in the last 3 months). She suffers from right foot drop and also right L4-L5 radiculopathy (evidenced on EMG from 01/17/17) for which she is being managed by physical therapy and neurology.  Foot drop has not improved and she continues to have low back pain. She has been on gabapentin and also takes Trileptal which she was prescribed by neurology along with meloxicam.  MRI 01/18/17: IMPRESSION:  Abnormal MRI of Lumbar Spine showing mild disc and facet degenerative changes at L 4-5 without significant stenosis  Last visit to OB/GYN was in 01/2017 and PET scan ordered revealed retroperitoneal lymphadenopathy and so she is scheduled for a repeat PET scan next month and as per GYN notes recommendation is for six-month follow-up for the next 5 years.  Past Medical History:  Diagnosis Date  . Anemia   . Asthma   . Depression   . Hypothyroidism   . Pre-diabetes   . Right foot drop   . Sleep apnea   . Thyroid disease   . Uterine cancer [facility])    Past Surgical History:  Procedure Laterality Date  . CARPAL TUNNEL RELEASE    . CESAREAN SECTION    . CHOLECYSTECTOMY    . cyst removal     uterus  . DILATION AND CURETTAGE OF UTERUS     still birth  . DILATION AND CURETTAGE OF UTERUS N/A 06/11/2016   Procedure:  DILATATION AND CURETTAGE;  Surgeon: Emily Filbert, MD;  Location: [facility];  Service: Gynecology;  Laterality: N/A;  . ROBOTIC ASSISTED TOTAL HYSTERECTOMY WITH BILATERAL SALPINGO OOPHERECTOMY Bilateral 07/09/2016   Procedure: XI ROBOTIC ASSISTED TOTAL HYSTERECTOMY WITH BILATERAL SALPINGO OOPHORECTOMY FOR UTERUS GREATER THAN 250 GRAMS, LYSIS OF ADHESIONS;  Surgeon: Everitt Amber, MD;  Location: [facility];  Service: Gynecology;  Laterality: Bilateral;  . SINUS EXPLORATION    . WISDOM TOOTH EXTRACTION      Allergies  Allergen Reactions  . Ciprofloxacin Shortness Of Breath  . Shrimp [Shellfish Allergy]     Outpatient Medications Prior to Visit  Medication Sig Dispense Refill  . albuterol (PROVENTIL HFA;VENTOLIN HFA) 108 (90 Base) MCG/ACT inhaler Inhale 1-2 puffs into the lungs every 6 (six) hours as needed for wheezing or shortness of breath. 54 g 3  . cetirizine (ZYRTEC) 10 MG tablet Take 10 mg by mouth daily.    Marland Kitchen levothyroxine (SYNTHROID, LEVOTHROID) 150 MCG tablet Take 1 tablet (150 mcg total) by mouth daily before breakfast. 30 tablet 3  . meloxicam (MOBIC) 15 MG tablet Take 1 tablet (15 mg total) by mouth daily. 30 tablet 2  . OXcarbazepine (TRILEPTAL) 150 MG tablet Take 1 tablet (150 mg total) by mouth 2 (two) times daily. 60 tablet 2  . albuterol (PROVENTIL) (2.5 MG/3ML) 0.083% nebulizer solution Take 3 mLs (2.5 mg total) by nebulization every 6 (six) hours as needed for wheezing or shortness of breath. 75  mL 3  . DULoxetine (CYMBALTA) 60 MG capsule Take 1 capsule (60 mg total) by mouth daily. 30 capsule 3  . Fluticasone-Salmeterol (ADVAIR DISKUS) 250-50 MCG/DOSE AEPB Inhale 1 puff into the lungs 2 (two) times daily. 180 each 3  . gabapentin (NEURONTIN) 300 MG capsule Take 3 capsules (900 mg total) by mouth 2 (two) times daily. 180 capsule 3  . montelukast (SINGULAIR) 10 MG tablet Take 1 tablet (10 mg total) by mouth at bedtime. 90 tablet 1  . ibuprofen (ADVIL,MOTRIN) 600 MG tablet Take 1 tablet  (600 mg total) by mouth every 6 (six) hours as needed. (Patient not taking: Reported on 02/18/2017) 30 tablet 1  . Lidocaine 4 % GEL Apply to right toe as needed. (Patient not taking: Reported on 02/18/2017) 10 g 11   No facility-administered medications prior to visit.     ROS Review of Systems  Constitutional: Negative for activity change, appetite change and fatigue.  HENT: Negative for congestion, sinus pressure and sore throat.   Eyes: Negative for visual disturbance.  Respiratory: Negative for cough, chest tightness, shortness of breath and wheezing.   Cardiovascular: Positive for leg swelling. Negative for chest pain and palpitations.  Gastrointestinal: Negative for abdominal distention, abdominal pain and constipation.  Endocrine: Negative for polydipsia.  Genitourinary: Negative for dysuria and frequency.  Musculoskeletal: Negative for arthralgias and back pain.  Skin: Negative for rash.  Neurological: Negative for tremors, light-headedness and numbness.  Hematological: Does not bruise/bleed easily.  Psychiatric/Behavioral: Negative for agitation and behavioral problems.    Objective:  BP (!) 145/79   Pulse 73   Temp 98 F (36.7 C) (Oral)   Ht 5\' 9"  (1.753 m)   Wt (!) 381 lb 12.8 oz (173.2 kg)   LMP  (LMP Unknown) Comment: irregular periods previously, bleeding started in january  SpO2 99%   BMI 56.38 kg/m   BP/Weight 02/18/2017 01/27/2017 19/62/2297  Systolic BP 989 211 941  Diastolic BP 79 63 75  Wt. (Lbs) 381.8 370.3 363  BMI 56.38 54.68 53.61      Physical Exam  Constitutional: She is oriented to person, place, and time. She appears well-developed and well-nourished.  Cardiovascular: Normal rate, normal heart sounds and intact distal pulses.  No murmur heard. Pulmonary/Chest: Effort normal and breath sounds normal. She has no wheezes. She has no rales. She exhibits no tenderness.  Abdominal: Soft. Bowel sounds are normal. She exhibits no distension and no mass.  There is no tenderness.  Musculoskeletal: Normal range of motion. She exhibits edema (2+ non pitting  edema of the right foot).  Negative Homans sign  Neurological: She is alert and oriented to person, place, and time.  Skin: Skin is warm and dry.  Psychiatric: She has a normal mood and affect.    CMP Latest Ref Rng & Units 11/21/2016 11/18/2016 10/07/2016  Glucose 65 - 99 mg/dL - 105(H) 96  BUN 6 - 24 mg/dL - 7 7  Creatinine 0.44 - 1.00 mg/dL 0.70 0.69 0.67  Sodium 134 - 144 mmol/L - 141 142  Potassium 3.5 - 5.2 mmol/L - 4.1 3.8  Chloride 96 - 106 mmol/L - 104 102  CO2 20 - 29 mmol/L - 28 24  Calcium 8.7 - 10.2 mg/dL - 9.0 9.2  Total Protein 6.0 - 8.5 g/dL - 6.8 -  Total Bilirubin 0.0 - 1.2 mg/dL - 0.6 -  Alkaline Phos 39 - 117 IU/L - 106 -  AST 0 - 40 IU/L - 23 -  ALT 0 -  32 IU/L - 14 -     Lab Results  Component Value Date   HGBA1C 6.8 02/18/2017    Assessment & Plan:   1. Type 2 diabetes mellitus with complication, without long-term current use of insulin (HCC) Diet controlled with A1c of 6.8 has trended up from 6.3 previously If it continues to trend up we will consider adding metformin. Diabetic diet, lifestyle modifications - POCT glucose (manual entry) - POCT glycosylated hemoglobin (Hb A1C)  2. Moderate persistent asthma without complication No acute exacerbation Continue medications - albuterol (PROVENTIL) (2.5 MG/3ML) 0.083% nebulizer solution; Take 3 mLs (2.5 mg total) by nebulization every 6 (six) hours as needed for wheezing or shortness of breath.  Dispense: 75 mL; Refill: 3 - Fluticasone-Salmeterol (ADVAIR DISKUS) 250-50 MCG/DOSE AEPB; Inhale 1 puff into the lungs 2 (two) times daily.  Dispense: 60 each; Refill: 3 - montelukast (SINGULAIR) 10 MG tablet; Take 1 tablet (10 mg total) by mouth at bedtime.  Dispense: 90 tablet; Refill: 1  3. Other diabetic neurological complication associated with type 2 diabetes mellitus ([place]) Uncontrolled on  gabapentin Switch to Lyrica - pregabalin (LYRICA) 75 MG capsule; Take 1 capsule (75 mg total) by mouth 2 (two) times daily.  Dispense: 60 capsule; Refill: 3  4. Hypothyroidism, unspecified type - TSH  5. Pedal edema Low suspicion for DVT, negative Homans sign Could be secondary to venous insufficiency - furosemide (LASIX) 20 MG tablet; Take 1 tablet (20 mg total) by mouth daily.  Dispense: 30 tablet; Refill: 0  6. Endometrial cancer ([place]) Status post surgery Low risk for recurrence as per GYN, no adjuvant therapy Follow-up PET scan as per GYN and continue surveillance as scheduled  7. Lumbar radiculopathy Uncontrolled continue physical therapy Hopefully addition of Lyrica will help Keep appointment with neurology - pregabalin (LYRICA) 75 MG capsule; Take 1 capsule (75 mg total) by mouth 2 (two) times daily.  Dispense: 60 capsule; Refill: 3  8. Mild episode of recurrent major depressive disorder (HCC) Stable - DULoxetine (CYMBALTA) 60 MG capsule; Take 1 capsule (60 mg total) by mouth daily.  Dispense: 30 capsule; Refill: 3  9. Elevated blood pressure reading With history of hypertension Low-sodium diet and will recheck at next visit   Meds ordered this encounter  Medications  . albuterol (PROVENTIL) (2.5 MG/3ML) 0.083% nebulizer solution    Sig: Take 3 mLs (2.5 mg total) by nebulization every 6 (six) hours as needed for wheezing or shortness of breath.    Dispense:  75 mL    Refill:  3  . DULoxetine (CYMBALTA) 60 MG capsule    Sig: Take 1 capsule (60 mg total) by mouth daily.    Dispense:  30 capsule    Refill:  3  . Fluticasone-Salmeterol (ADVAIR DISKUS) 250-50 MCG/DOSE AEPB    Sig: Inhale 1 puff into the lungs 2 (two) times daily.    Dispense:  60 each    Refill:  3  . montelukast (SINGULAIR) 10 MG tablet    Sig: Take 1 tablet (10 mg total) by mouth at bedtime.    Dispense:  90 tablet    Refill:  1  . furosemide (LASIX) 20 MG tablet    Sig: Take 1 tablet (20 mg  total) by mouth daily.    Dispense:  30 tablet    Refill:  0  . pregabalin (LYRICA) 75 MG capsule    Sig: Take 1 capsule (75 mg total) by mouth 2 (two) times daily.    Dispense:  60  capsule    Refill:  3    Discontinue gabapentin    Follow-up: Return in about 3 months (around 05/19/2017) for Follow-up on chronic medical conditions.   Arnoldo Morale MD

## 2017-02-19 ENCOUNTER — Other Ambulatory Visit: Payer: Self-pay | Admitting: Family Medicine

## 2017-02-19 ENCOUNTER — Encounter: Payer: Self-pay | Admitting: Family Medicine

## 2017-02-19 LAB — TSH: TSH: 1.81 u[IU]/mL (ref 0.450–4.500)

## 2017-02-19 MED ORDER — LEVOTHYROXINE SODIUM 150 MCG PO TABS: 150.0000 ug | ORAL_TABLET | Freq: Every day | ORAL | 3 refills | Status: DC

## 2017-02-20 ENCOUNTER — Ambulatory Visit: Payer: Self-pay | Admitting: Neurology

## 2017-02-21 ENCOUNTER — Ambulatory Visit: Payer: Medicaid Other | Attending: Family Medicine

## 2017-02-21 DIAGNOSIS — M6281 Muscle weakness (generalized): Secondary | ICD-10-CM

## 2017-02-21 DIAGNOSIS — R2689 Other abnormalities of gait and mobility: Secondary | ICD-10-CM

## 2017-02-21 DIAGNOSIS — R208 Other disturbances of skin sensation: Secondary | ICD-10-CM | POA: Diagnosis present

## 2017-02-21 DIAGNOSIS — M5441 Lumbago with sciatica, right side: Secondary | ICD-10-CM | POA: Diagnosis present

## 2017-02-21 DIAGNOSIS — G8929 Other chronic pain: Secondary | ICD-10-CM | POA: Insufficient documentation

## 2017-02-21 NOTE — Patient Instructions (Addendum)
Hamstring Stretch, Seated    Sit with one leg extended onto facing chair. Bend at hips, so back is straight. Hold for __30__ seconds. Repeat __3__ times each leg. Perform 2-3 times a day.  Copyright  VHI. All rights reserved.   Gastroc / Heel Cord Stretch - Seated With Towel    Sit on floor, towel./sheet around ball of foot. Gently pull right foot in toward body, stretching heel cord and calf. Hold for _30__ seconds.  Repeat _3__ times. Do _2-3__ times per day.  Copyright  VHI. All rights reserved.   Gastroc Stretch - Standing    Stand with left leg straight, heel on floor, toes pointed slightly inward. Lean into wall until stretch is felt in calf. Hold for _30__ seconds. DON'T PERFORM WITH RIGHT FOOT. Repeat _3__ times. Do _2-3__ times per day.  Copyright  VHI. All rights reserved.   Toe / Heel Raise (Sitting)    Sitting, raise heels, then rock back on heels and raise toes. Repeat __5__ times. Relax and then perform a total of 3 sets of 5 reps total. Perform every other day.   Copyright  VHI. All rights reserved.   ANKLE: Eversion, Bilateral    Sit at edge of surface, feet on floor. Raise toes of right foot up and move them away from body. Do not move hips or knees. _5__ reps per set, _3__ sets per day, __3_ days per week. You can perform with Left foot too, but main focus is on Right foot at this time.   Copyright  VHI. All rights reserved.   Functional Quadriceps: Sit to Stand    Sit on edge of chair, feet flat on floor. Stand upright, extending knees fully. Repeat __10__ times per set. Do __2__ sets per session. Do _3___ sessions per week.  http://orth.exer.us/735   Copyright  VHI. All rights reserved.   ABDUCTION: Sitting (Active)    Sit with feet flat. Lift right leg slightly and draw it out to side. Repeat with other leg. Complete _3__ sets of _10__ repetitions. Perform _3__ sessions per week.  Copyright  VHI. All rights reserved.

## 2017-02-21 NOTE — Therapy (Signed)
[facility] [address] [place], [place], [address] Phone: [phone]   Fax:  [phone]  Physical Therapy Treatment  Patient Details  Name: Janet [name] MRN: [id number] Date of Birth: Jan 20, 1971 Referring Provider: Dr. Krista Blue   Encounter Date: 02/21/2017  PT End of Session - 02/21/17 0920    Visit Number  2    Number of Visits  4    Authorization Type  Medicaid: 3 visits approved through 02/21/17-03/13/17    PT Start Time  0848    PT Stop Time  0928    PT Time Calculation (min)  40 min    Equipment Utilized During Treatment  -- S prn    Activity Tolerance  Patient tolerated treatment well    Behavior During Therapy  [facility] for tasks assessed/performed       Past Medical History:  Diagnosis Date  . Anemia   . Asthma   . Depression   . Hypothyroidism   . Pre-diabetes   . Right foot drop   . Sleep apnea   . Thyroid disease   . Uterine cancer [facility])     Past Surgical History:  Procedure Laterality Date  . CARPAL TUNNEL RELEASE    . CESAREAN SECTION    . CHOLECYSTECTOMY    . cyst removal     uterus  . DILATION AND CURETTAGE OF UTERUS     still birth  . DILATION AND CURETTAGE OF UTERUS N/A 06/11/2016   Procedure: DILATATION AND CURETTAGE;  Surgeon: Emily Filbert, MD;  Location: [facility];  Service: Gynecology;  Laterality: N/A;  . ROBOTIC ASSISTED TOTAL HYSTERECTOMY WITH BILATERAL SALPINGO OOPHERECTOMY Bilateral 07/09/2016   Procedure: XI ROBOTIC ASSISTED TOTAL HYSTERECTOMY WITH BILATERAL SALPINGO OOPHORECTOMY FOR UTERUS GREATER THAN 250 GRAMS, LYSIS OF ADHESIONS;  Surgeon: Everitt Amber, MD;  Location: [facility];  Service: Gynecology;  Laterality: Bilateral;  . SINUS EXPLORATION    . WISDOM TOOTH EXTRACTION      There were no vitals filed for this visit.  Subjective Assessment - 02/21/17 0851    Subjective  Pt reported she had one fall since last visit, she missed one step and fell into doorway and gashed her  elbow. Pt denied hitting head. Pt has not taken Lasix yet for RLE swelling, she wants to see if PT helps. PT educated pt on the importance of taking medication as prescribed.     Pertinent History  Asthma, hypothyroidism, diabetic neuropathy (N/T starting below knee to feet), chronic R LBP with sciatic, hx endometrial CA (MD just cleared pt) with hysterectomy, thrombocytopenia, depression, anemia, sleep apnea    Patient Stated Goals  To walk longer distances and without pain (and normal)    Currently in Pain?  Yes    Pain Score  5     Pain Location  Ankle    Pain Orientation  Right    Pain Descriptors / Indicators  Aching    Pain Type  Chronic pain    Pain Onset  More than a month ago    Pain Frequency  Intermittent more often than not    Aggravating Factors   walking, movement    Pain Relieving Factors  nothing really          Therex: Pt performed flexibility and strengthening HEP. Please see pt instructions for details. Modified exercises if pt reported R foot pain.                    PT  Education - 02/21/17 0915    Education provided  Yes    Education Details  PT provided pt with flexibility and strengthening HEP.     Person(s) Educated  Patient    Methods  Explanation;Demonstration;Verbal cues;Handout    Comprehension  Returned demonstration;Verbalized understanding       PT Short Term Goals - 01/30/17 1359      PT SHORT TERM GOAL #1   Title  Pt will be IND in HEP to improve strength, balance, endurance, and flexibililty TARGET FOR ALL STGS: 3 TREATMENT SESSIONS.    Baseline  No HEP    Status  New      PT SHORT TERM GOAL #2   Title  Trial R AFO and determine best AD for amb. and safety during mobility.     Baseline  No AFO    Status  New      PT SHORT TERM GOAL #3   Title  Pt will improve gait speed with LRAD and R AFO/brace to >/=2.62 ft/sec. to safely amb. in the community.     Baseline  2.54ft/sec no AD or AFO    Status  New        PT Long  Term Goals - 01/30/17 1450      PT LONG TERM GOAL #1   Title  Pt will be IND in progressed HEP to improve strength, balance, endurance, and flexibililty. TARGET DATE FOR ALL LTGS: 03/27/17    Baseline  No HEP    Status  New      PT LONG TERM GOAL #2   Title  Pt will amb. 600' over even and paved surfaces with LRAD and AFO, at MOD I level, in order to improve functional mobility and to care for son.     Baseline  100' with S for safety, over even terrain.    Status  New      PT LONG TERM GOAL #3   Title  Pt will improve DGI score to >/=20/24 with AFO/brace donned to decr. falls risk.     Baseline  11/24    Status  New      PT LONG TERM GOAL #4   Title  Pt will perform TUG in </=13.5 sec. with LRAD and AFO to decr. falls risk.     Baseline  15.88 sec. no AD    Status  New            Plan - 02/21/17 6222    Clinical Impression Statement  Today's skilled session focused on establishing flexibility and strengthening HEP. Pt required modification for some exercises 2/2 R foot pain and rest breaks 2/2 pain. Overall, pt tolerated HEP well. Pt would continue to benefit from skilled PT to improve safety during functional mobility.     Rehab Potential  Good    Clinical Impairments Affecting Rehab Potential  see above.    PT Frequency  -- 1x/week for 3 weeks plus eval (based on Medicaid)    PT Treatment/Interventions  ADLs/Self Care Home Management;Biofeedback;Electrical Stimulation;Therapeutic activities;Therapeutic exercise;Manual techniques;Vestibular;Functional mobility training;Stair training;Gait training;Patient/family education;Orthotic Fit/Training;DME Instruction;Neuromuscular re-education;Balance training    PT Next Visit Plan  Review HEP prn, establish balance HEP. Trial R AFO    Consulted and Agree with Plan of Care  Patient       Patient will benefit from skilled therapeutic intervention in order to improve the following deficits and impairments:  Abnormal gait, Decreased  endurance, Impaired sensation, Decreased knowledge of use of DME, Decreased strength,  Obesity, Decreased balance, Decreased mobility, Impaired flexibility, Postural dysfunction, Decreased range of motion  Visit Diagnosis: Muscle weakness (generalized)  Other abnormalities of gait and mobility     Problem List Patient Active Problem List   Diagnosis Date Noted  . Right thigh pain 01/17/2017  . Right foot drop 12/23/2016  . Chronic right-sided low back pain with right-sided sciatica 12/23/2016  . Pedal edema 10/07/2016  . Endometrial cancer ([place]) 08/05/2016  . Superficial postoperative wound infection 07/22/2016  . 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 Diabetic neuropathy ([place]) 05/27/2016  . Anemia 05/08/2016  . DUB (dysfunctional uterine bleeding) 05/08/2016  . Type 2 diabetes mellitus ([place]) 02/16/2016  . Morbid obesity ([place]) 04/12/2015  . Depression 04/12/2015  . Asthma 03/30/2015  . Hypoxemia 03/30/2015  . Hypothyroidism 03/30/2015  . Thrombocytopenia ([place]) 03/30/2015  . Hypokalemia 03/30/2015    Kamaya Keckler L 02/21/2017, 9:29 AM  [facility] [address] [place], [place], [address] Phone: [phone]   Fax:  [phone]  Name: Alexa Blish MRN: [id number] Date of Birth: 1970/08/10  Geoffry Paradise, PT,DPT 02/21/17 9:30 AM Phone: [phone] Fax: [phone]

## 2017-02-24 ENCOUNTER — Other Ambulatory Visit: Payer: Self-pay | Admitting: Family Medicine

## 2017-02-26 ENCOUNTER — Other Ambulatory Visit: Payer: Self-pay | Admitting: Family Medicine

## 2017-02-27 ENCOUNTER — Other Ambulatory Visit: Payer: Self-pay

## 2017-02-27 ENCOUNTER — Ambulatory Visit: Payer: Medicaid Other

## 2017-02-27 DIAGNOSIS — M6281 Muscle weakness (generalized): Secondary | ICD-10-CM

## 2017-02-27 DIAGNOSIS — R2689 Other abnormalities of gait and mobility: Secondary | ICD-10-CM

## 2017-02-27 NOTE — Patient Instructions (Addendum)
Feet Together, Head Motion - Eyes Closed    With eyes closed and feet together, move head slowly, up and down 5 times and side to side 5 times. Repeat __3__ times per session. Do __1__ sessions per day.  Copyright  VHI. All rights reserved.   Feet Together, Varied Arm Positions - Eyes Closed    Stand with feet together and arms at your side. Close eyes and visualize upright position. Hold __30__ seconds. Repeat __3__ times per session. Do __1__ sessions per day.  Copyright  VHI. All rights reserved.   Single Leg - Eyes Open    Holding support, lift right leg while maintaining balance over other leg. Progress to removing hands from support surface for longer periods of time. Hold__10__ seconds. Repeat with the other leg. Repeat __3__ times per session. Do __1__ sessions per day.  Copyright  VHI. All rights reserved.

## 2017-02-27 NOTE — Therapy (Signed)
[facility] [address] [place], [place], [address] Phone: [phone]   Fax:  [phone]  Physical Therapy Treatment  Patient Details  Name: Janet [name] MRN: [id number] Date of Birth: 08-Sep-1970 Referring Provider: Dr. Krista Blue   Encounter Date: 02/27/2017  PT End of Session - 02/27/17 0928    Visit Number  3    Number of Visits  4    Authorization Type  Medicaid: 3 visits approved through 02/21/17-03/13/17    PT Start Time  0847    PT Stop Time  0927    PT Time Calculation (min)  40 min    Equipment Utilized During Treatment  -- S prn    Activity Tolerance  Patient tolerated treatment well    Behavior During Therapy  [facility] for tasks assessed/performed       Past Medical History:  Diagnosis Date  . Anemia   . Asthma   . Depression   . Hypothyroidism   . Pre-diabetes   . Right foot drop   . Sleep apnea   . Thyroid disease   . Uterine cancer [facility])     Past Surgical History:  Procedure Laterality Date  . CARPAL TUNNEL RELEASE    . CESAREAN SECTION    . CHOLECYSTECTOMY    . cyst removal     uterus  . DILATION AND CURETTAGE OF UTERUS     still birth  . DILATION AND CURETTAGE OF UTERUS N/A 06/11/2016   Procedure: DILATATION AND CURETTAGE;  Surgeon: Emily Filbert, MD;  Location: [facility];  Service: Gynecology;  Laterality: N/A;  . ROBOTIC ASSISTED TOTAL HYSTERECTOMY WITH BILATERAL SALPINGO OOPHERECTOMY Bilateral 07/09/2016   Procedure: XI ROBOTIC ASSISTED TOTAL HYSTERECTOMY WITH BILATERAL SALPINGO OOPHORECTOMY FOR UTERUS GREATER THAN 250 GRAMS, LYSIS OF ADHESIONS;  Surgeon: Everitt Amber, MD;  Location: [facility];  Service: Gynecology;  Laterality: Bilateral;  . SINUS EXPLORATION    . WISDOM TOOTH EXTRACTION      There were no vitals filed for this visit.  Subjective Assessment - 02/27/17 0851    Subjective  Pt denied falls since last visit. Pt reported she has LBP this morning after bending forward but didn't hear  anything "pop". Pt reported HEP has been going well.     Pertinent History  Asthma, hypothyroidism, diabetic neuropathy (N/T starting below knee to feet), chronic R LBP with sciatic, hx endometrial CA (MD just cleared pt) with hysterectomy, thrombocytopenia, depression, anemia, sleep apnea    Patient Stated Goals  To walk longer distances and without pain (and normal)    Currently in Pain?  Yes    Pain Score  3  at rest and 7/10 with movement    Pain Location  Back    Pain Orientation  Lower    Pain Descriptors / Indicators  Aching    Pain Type  Acute pain    Pain Onset  Today    Pain Frequency  Constant    Aggravating Factors   walking, bending    Pain Relieving Factors  rest, sitting          Neuro re-ed: Pt performed feet apart/together with eyes open/closed in corner with S for safety. Please see pt instructions for HEP details. Cues and demo for technique.             [facility] Adult PT Treatment/Exercise - 02/27/17 0905      Ambulation/Gait   Ambulation/Gait  Yes    Ambulation/Gait Assistance  5: Supervision  Ambulation/Gait Assistance Details  Pt amb. 115' x2 (once with flip flops and once with tennis shoes) without AFO for PT to analyze gait, pt reported R knee buckles with long distance but not noted during amb. during PT session. Pt trialed different R AFOs to decr. R foot drop.    Ambulation Distance (Feet)  115 Feet x3    Assistive device  None    Gait Pattern  Step-through pattern;Decreased stride length;Decreased weight shift to right;Wide base of support;Decreased trunk rotation;Right foot flat;Antalgic    Ambulation Surface  Level;Indoor    Gait Comments  Pt noted to have improved R toe clearance with R AFOs, pt felt more secure in ant. R AFO vs. posterior R AFO.             PT Education - 02/27/17 [phone]    Education provided  Yes    Education Details  PT discussed AFO process of trialing, scheduling appt with orthotist and ins. auth. PT provided pt  with balance HEP.     Person(s) Educated  Patient    Methods  Explanation;Demonstration;Verbal cues;Handout    Comprehension  Verbalized understanding;Returned demonstration       PT Short Term Goals - 01/30/17 1359      PT SHORT TERM GOAL #1   Title  Pt will be IND in HEP to improve strength, balance, endurance, and flexibililty TARGET FOR ALL STGS: 3 TREATMENT SESSIONS.    Baseline  No HEP    Status  New      PT SHORT TERM GOAL #2   Title  Trial R AFO and determine best AD for amb. and safety during mobility.     Baseline  No AFO    Status  New      PT SHORT TERM GOAL #3   Title  Pt will improve gait speed with LRAD and R AFO/brace to >/=2.62 ft/sec. to safely amb. in the community.     Baseline  2.21ft/sec no AD or AFO    Status  New        PT Long Term Goals - 01/30/17 1450      PT LONG TERM GOAL #1   Title  Pt will be IND in progressed HEP to improve strength, balance, endurance, and flexibililty. TARGET DATE FOR ALL LTGS: 03/27/17    Baseline  No HEP    Status  New      PT LONG TERM GOAL #2   Title  Pt will amb. 600' over even and paved surfaces with LRAD and AFO, at MOD I level, in order to improve functional mobility and to care for son.     Baseline  100' with S for safety, over even terrain.    Status  New      PT LONG TERM GOAL #3   Title  Pt will improve DGI score to >/=20/24 with AFO/brace donned to decr. falls risk.     Baseline  11/24    Status  New      PT LONG TERM GOAL #4   Title  Pt will perform TUG in </=13.5 sec. with LRAD and AFO to decr. falls risk.     Baseline  15.88 sec. no AD    Status  New            Plan - 02/27/17 6270    Clinical Impression Statement  Pt demonstrated improved R toe clearance with R AFOs donned. Pt would likely benefit from R anterior AFO, as pt reports  R knee buckles when amb. longer distances. PT will have orthotist come to PT appt to determine best AFO for pt. PT also established balance HEP to improve safety.  Pt would continue to benefit from skilled PT to improve safety during functional mobility.     Rehab Potential  Good    Clinical Impairments Affecting Rehab Potential  see above.    PT Frequency  -- 1x/week for 3 weeks plus eval (based on Medicaid)    PT Treatment/Interventions  ADLs/Self Care Home Management;Biofeedback;Electrical Stimulation;Therapeutic activities;Therapeutic exercise;Manual techniques;Vestibular;Functional mobility training;Stair training;Gait training;Patient/family education;Orthotic Fit/Training;DME Instruction;Neuromuscular re-education;Balance training    PT Next Visit Plan  Have Gerald Stabs come to appt. for AFO fitting.  Check STGs and renew.     Consulted and Agree with Plan of Care  Patient       Patient will benefit from skilled therapeutic intervention in order to improve the following deficits and impairments:  Abnormal gait, Decreased endurance, Impaired sensation, Decreased knowledge of use of DME, Decreased strength, Obesity, Decreased balance, Decreased mobility, Impaired flexibility, Postural dysfunction, Decreased range of motion  Visit Diagnosis: Other abnormalities of gait and mobility  Muscle weakness (generalized)     Problem List Patient Active Problem List   Diagnosis Date Noted  . Right thigh pain 01/17/2017  . Right foot drop 12/23/2016  . Chronic right-sided low back pain with right-sided sciatica 12/23/2016  . Pedal edema 10/07/2016  . Endometrial cancer ([place]) 08/05/2016  . Superficial postoperative wound infection 07/22/2016  . 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 Diabetic neuropathy ([place]) 05/27/2016  . Anemia 05/08/2016  . DUB (dysfunctional uterine bleeding) 05/08/2016  . Type 2 diabetes mellitus ([place]) 02/16/2016  . Morbid obesity ([place]) 04/12/2015  . Depression 04/12/2015  . Asthma 03/30/2015  . Hypoxemia 03/30/2015  . Hypothyroidism 03/30/2015  . Thrombocytopenia ([place]) 03/30/2015  . Hypokalemia 03/30/2015     Chassidy Layson L 02/27/2017, 9:32 AM  [facility] [address] [place], [place], [address] Phone: [phone]   Fax:  [phone]  Name: Matti Killingsworth MRN: [id number] Date of Birth: April 10, 1970  Geoffry Paradise, PT,DPT 02/27/17 9:33 AM Phone: [phone] Fax: [phone]

## 2017-03-06 ENCOUNTER — Ambulatory Visit: Payer: Medicaid Other

## 2017-03-06 DIAGNOSIS — R2689 Other abnormalities of gait and mobility: Secondary | ICD-10-CM

## 2017-03-06 DIAGNOSIS — M5441 Lumbago with sciatica, right side: Secondary | ICD-10-CM

## 2017-03-06 DIAGNOSIS — M6281 Muscle weakness (generalized): Secondary | ICD-10-CM

## 2017-03-06 DIAGNOSIS — G8929 Other chronic pain: Secondary | ICD-10-CM

## 2017-03-06 DIAGNOSIS — R208 Other disturbances of skin sensation: Secondary | ICD-10-CM

## 2017-03-06 NOTE — Addendum Note (Signed)
Addended by: Elza Rafter on: 03/06/2017 09:54 AM   Modules accepted: Orders

## 2017-03-06 NOTE — Therapy (Addendum)
[facility] [address] [place], [place], [address] Phone: [phone]   Fax:  [phone]  Physical Therapy Treatment  Patient Details  Name: Janet [name] MRN: [id number] Date of Birth: [birth year] Referring Provider: Dr. Krista Blue   Encounter Date: 03/06/2017  PT End of Session - 03/06/17 0940    Visit Number  4    Number of Visits  4    Authorization Type  Medicaid: 3 visits approved through 02/21/17-03/13/17-PT requesting additional 2x/week for 6 weeks    PT Start Time  0846    PT Stop Time  0928    PT Time Calculation (min)  42 min    Equipment Utilized During Treatment  -- S prn    Activity Tolerance  Patient tolerated treatment well    Behavior During Therapy  [facility] for tasks assessed/performed       Past Medical History:  Diagnosis Date  . Anemia   . Asthma   . Depression   . Hypothyroidism   . Pre-diabetes   . Right foot drop   . Sleep apnea   . Thyroid disease   . Uterine cancer [facility])     Past Surgical History:  Procedure Laterality Date  . CARPAL TUNNEL RELEASE    . CESAREAN SECTION    . CHOLECYSTECTOMY    . cyst removal     uterus  . DILATION AND CURETTAGE OF UTERUS     still birth  . DILATION AND CURETTAGE OF UTERUS N/A 06/11/2016   Procedure: DILATATION AND CURETTAGE;  Surgeon: Emily Filbert, MD;  Location: [facility];  Service: Gynecology;  Laterality: N/A;  . ROBOTIC ASSISTED TOTAL HYSTERECTOMY WITH BILATERAL SALPINGO OOPHERECTOMY Bilateral 07/09/2016   Procedure: XI ROBOTIC ASSISTED TOTAL HYSTERECTOMY WITH BILATERAL SALPINGO OOPHORECTOMY FOR UTERUS GREATER THAN 250 GRAMS, LYSIS OF ADHESIONS;  Surgeon: Everitt Amber, MD;  Location: [facility];  Service: Gynecology;  Laterality: Bilateral;  . SINUS EXPLORATION    . WISDOM TOOTH EXTRACTION      There were no vitals filed for this visit.  Subjective Assessment - 03/06/17 0851    Subjective  Pt denied falls or changes since last visit. Pt is wearing a R  ankle brace for support. Pt wearing tennis shoes today and reports she can't feel the ground as well with tennis shoes donned. Pt can list HEP and reports she's been performing at home consistently.    Pertinent History  Asthma, hypothyroidism, diabetic neuropathy (N/T starting below knee to feet), chronic R LBP with sciatic, hx endometrial CA (MD just cleared pt) with hysterectomy, thrombocytopenia, depression, anemia, sleep apnea    Patient Stated Goals  To walk longer distances and without pain (and normal)    Currently in Pain?  Yes    Pain Score  4     Pain Location  Calf    Pain Orientation  Right    Pain Descriptors / Indicators  Tingling    Pain Type  Acute pain    Pain Onset  Yesterday    Pain Frequency  Constant    Aggravating Factors   walking    Pain Relieving Factors  nothing                      [facility] Adult PT Treatment/Exercise - 03/06/17 0855      Ambulation/Gait   Ambulation/Gait  Yes    Ambulation/Gait Assistance  5: Supervision    Ambulation/Gait Assistance Details  Pt amb. with R ant. AFO  donned to improve toe clearance. Cues for sequencing with SPC in L hand to improve RLE support.    Ambulation Distance (Feet)  100 Feet no AD, 30' and 10'x10 reps in // bars with [facility]    Assistive device  None;Straight cane    Gait Pattern  Step-through pattern;Decreased stride length;Decreased weight shift to right;Wide base of support;Decreased trunk rotation;Right foot flat;Antalgic    Ambulation Surface  Level;Indoor    Gait velocity  2.68 ft/sec. and 2.33f/sec.  with R ant. AFO donned and no AD      Standardized Balance Assessment   Standardized Balance Assessment  Dynamic Gait Index      Dynamic Gait Index   Level Surface  Mild Impairment    Change in Gait Speed  Mild Impairment    Gait with Horizontal Head Turns  Mild Impairment    Gait with Vertical Head Turns  Mild Impairment    Gait and Pivot Turn  Moderate Impairment    Step Over Obstacle  Moderate  Impairment    Step Around Obstacles  Normal    Steps  Moderate Impairment    Total Score  14             PT Education - 03/06/17 0939    Education provided  Yes    Education Details  PT discussed goal progress and renewal process. PT educated pt on outcome measure results. PT encouraged pt not to wear R ankle (lateral support) brace from [employer], and to instead focus on strengthening HEP. PT educated pt on sequencing with SPC in L hand to improve RLE support.     Person(s) Educated  Patient    Methods  Explanation;Demonstration;Verbal cues    Comprehension  Verbalized understanding;Returned demonstration       PT Short Term Goals - 03/06/17 0943      PT SHORT TERM GOAL #1   Title  Pt will be IND in HEP to improve strength, balance, endurance, and flexibililty TARGET FOR ALL STGS: 3 TREATMENT SESSIONS.    Baseline  Pt performing HEP IND at home.     Status  Achieved      PT SHORT TERM GOAL #2   Title  Trial R AFO and determine best AD for amb. and safety during mobility.     Baseline  PT and pt have trialed R AFO braces and pt demonstrates improve R toe clearance with AFO vs. no AFO. However, we are awaiting orthotist consult so goal is only partially met.     Status  Partially Met      PT SHORT TERM GOAL #3   Title  Pt will improve gait speed with LRAD and R AFO/brace to >/=2.62 ft/sec. to safely amb. in the community.     Baseline  2.654fsec and 2.81 ft/sec. with  R AFO donned and no AD    Status  Achieved        PT Long Term Goals - 03/06/17 0947      PT LONG TERM GOAL #1   Title  Pt will be IND in progressed HEP to improve strength, balance, endurance, and flexibililty. TARGET DATE FOR ALL LTGS: after 12 additional PT visits-awaiting Medicaid approval for additional visits.     Baseline  No HEP    Status  New      PT LONG TERM GOAL #2   Title  Pt will amb. 600' over even and paved surfaces with LRAD and AFO, at MOD I level, in order to improve  functional  mobility and to care for son.     Baseline  100' with S for safety, over even terrain.    Status  New      PT LONG TERM GOAL #3   Title  Pt will improve DGI score to >/=20/24 with AFO/brace donned to decr. falls risk.     Baseline  11/24    Status  New      PT LONG TERM GOAL #4   Title  Pt will perform TUG in </=13.5 sec. with LRAD and AFO to decr. falls risk.     Baseline  15.88 sec. no AD    Status  New            Plan - 03/06/17 0940    Clinical Impression Statement  Pt demonstrated progress as she met STG 1 and 3, pt partially met STG 2 (as we are awaiting orthotist consult) but pt demonstrated improved R toe clearance with R AFO. Pt significantly improved DGI score from 11/24 to 14/24, however, pt is still at a high falls risk. Pt continues to make progress towards long term goals. Pt would continue to benefit from skilled PT to improve safety during functional mobility.     Rehab Potential  Good    Clinical Impairments Affecting Rehab Potential  see above.    PT Frequency  -- 1x/week for 3 weeks plus eval (based on Medicaid), PT requesting additional 2x/week for 6 weeks based on progress.    PT Treatment/Interventions  ADLs/Self Care Home Management;Biofeedback;Electrical Stimulation;Therapeutic activities;Therapeutic exercise;Manual techniques;Vestibular;Functional mobility training;Stair training;Gait training;Patient/family education;Orthotic Fit/Training;DME Instruction;Neuromuscular re-education;Balance training    PT Next Visit Plan  Have Gerald Stabs come to appt. for AFO fitting.  Gait training with R ant. AFO and SPC, high level balance.     Consulted and Agree with Plan of Care  Patient       Patient will benefit from skilled therapeutic intervention in order to improve the following deficits and impairments:  Abnormal gait, Decreased endurance, Impaired sensation, Decreased knowledge of use of DME, Decreased strength, Obesity, Decreased balance, Decreased mobility, Impaired  flexibility, Postural dysfunction, Decreased range of motion  Visit Diagnosis: Other abnormalities of gait and mobility - Plan: PT plan of care cert/re-cert  Muscle weakness (generalized) - Plan: PT plan of care cert/re-cert  Other disturbances of skin sensation - Plan: PT plan of care cert/re-cert  Chronic right-sided low back pain with right-sided sciatica - Plan: PT plan of care cert/re-cert     Problem List Patient Active Problem List   Diagnosis Date Noted  . Right thigh pain 01/17/2017  . Right foot drop 12/23/2016  . Chronic right-sided low back pain with right-sided sciatica 12/23/2016  . Pedal edema 10/07/2016  . Endometrial cancer ([place]) 08/05/2016  . Superficial postoperative wound infection 07/22/2016  . 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 Diabetic neuropathy ([place]) 05/27/2016  . Anemia 05/08/2016  . DUB (dysfunctional uterine bleeding) 05/08/2016  . Type 2 diabetes mellitus ([place]) 02/16/2016  . Morbid obesity ([place]) 04/12/2015  . Depression 04/12/2015  . Asthma 03/30/2015  . Hypoxemia 03/30/2015  . Hypothyroidism 03/30/2015  . Thrombocytopenia ([place]) 03/30/2015  . Hypokalemia 03/30/2015    Shakeeta Godette L 03/06/2017, 9:54 AM  [facility] [address] [place], [place], [address] Phone: [phone]   Fax:  [phone]  Name: Janet [name] MRN: [id number] Date of Birth: 19-Jun-1970  Geoffry Paradise, PT,DPT 03/06/17 9:54 AM Phone: [phone] Fax: [phone]

## 2017-03-09 ENCOUNTER — Other Ambulatory Visit: Payer: Self-pay | Admitting: Family Medicine

## 2017-03-13 ENCOUNTER — Telehealth: Payer: Self-pay

## 2017-03-13 DIAGNOSIS — M21371 Foot drop, right foot: Secondary | ICD-10-CM

## 2017-03-13 NOTE — Telephone Encounter (Signed)
Order for right APO was placed.

## 2017-03-13 NOTE — Telephone Encounter (Signed)
Dr. Krista Blue  Janet [name] has ambulated with a R AFO and demonstrated improved R toe clearance with AFO donned vs. No AFO. I feel she would benefit from a R AFO to improve safety during ambulation and ADLs. If you agree, please send [place] a prescription for a R AFO.  Thank you, Geoffry Paradise, PT,DPT 03/13/17 10:55 AM Phone: [phone] Fax: [phone]

## 2017-03-14 ENCOUNTER — Telehealth: Payer: Self-pay | Admitting: *Deleted

## 2017-03-14 ENCOUNTER — Ambulatory Visit: Payer: Medicaid Other | Attending: Family Medicine

## 2017-03-14 DIAGNOSIS — M5441 Lumbago with sciatica, right side: Secondary | ICD-10-CM | POA: Diagnosis present

## 2017-03-14 DIAGNOSIS — G8929 Other chronic pain: Secondary | ICD-10-CM | POA: Diagnosis present

## 2017-03-14 DIAGNOSIS — M21371 Foot drop, right foot: Secondary | ICD-10-CM

## 2017-03-14 DIAGNOSIS — R2689 Other abnormalities of gait and mobility: Secondary | ICD-10-CM

## 2017-03-14 DIAGNOSIS — M6281 Muscle weakness (generalized): Secondary | ICD-10-CM | POA: Diagnosis present

## 2017-03-14 DIAGNOSIS — R208 Other disturbances of skin sensation: Secondary | ICD-10-CM | POA: Diagnosis present

## 2017-03-14 NOTE — Telephone Encounter (Signed)
Patient stopped by the office.  Wants to know if she can take Oxycod/acetamin 5/325MD for her back pain.  She was prescribed this from another MD but wants to make sure she can take it with what Dr. Krista Blue has prescribed?

## 2017-03-14 NOTE — Therapy (Signed)
[facility] [address] [place], [place], [address] Phone: [phone]   Fax:  [phone]  Physical Therapy Treatment  Patient Details  Name: Janet [name] MRN: [id number] Date of Birth: 1970/09/08 Referring Provider: Dr. Krista Blue   Encounter Date: 03/14/2017  PT End of Session - 03/14/17 0933    Visit Number  5    Number of Visits  12    Authorization Type  Medicaid: 12 visits approved through 03/14/2017--04/10/2017    PT Start Time  0840    PT Stop Time  0920    PT Time Calculation (min)  40 min    Equipment Utilized During Treatment  Gait belt S prn    Activity Tolerance  Patient tolerated treatment well    Behavior During Therapy  [facility] for tasks assessed/performed       Past Medical History:  Diagnosis Date  . Anemia   . Asthma   . Depression   . Hypothyroidism   . Pre-diabetes   . Right foot drop   . Sleep apnea   . Thyroid disease   . Uterine cancer [facility])     Past Surgical History:  Procedure Laterality Date  . CARPAL TUNNEL RELEASE    . CESAREAN SECTION    . CHOLECYSTECTOMY    . cyst removal     uterus  . DILATION AND CURETTAGE OF UTERUS     still birth  . DILATION AND CURETTAGE OF UTERUS N/A 06/11/2016   Procedure: DILATATION AND CURETTAGE;  Surgeon: Emily Filbert, MD;  Location: [facility];  Service: Gynecology;  Laterality: N/A;  . ROBOTIC ASSISTED TOTAL HYSTERECTOMY WITH BILATERAL SALPINGO OOPHERECTOMY Bilateral 07/09/2016   Procedure: XI ROBOTIC ASSISTED TOTAL HYSTERECTOMY WITH BILATERAL SALPINGO OOPHORECTOMY FOR UTERUS GREATER THAN 250 GRAMS, LYSIS OF ADHESIONS;  Surgeon: Everitt Amber, MD;  Location: [facility];  Service: Gynecology;  Laterality: Bilateral;  . SINUS EXPLORATION    . WISDOM TOOTH EXTRACTION      There were no vitals filed for this visit.  Subjective Assessment - 03/14/17 0842    Subjective  pt reports she had stumbled 2-3 times going up stairs at the home . pt caught herself with R arm  and did not fall. pt reports she has most difficulty with clearing first step.    Pertinent History  Asthma, hypothyroidism, diabetic neuropathy (N/T starting below knee to feet), chronic R LBP with sciatic, hx endometrial CA (MD just cleared pt) with hysterectomy, thrombocytopenia, depression, anemia, sleep apnea    Patient Stated Goals  To walk longer distances and without pain (and normal)    Currently in Pain?  Yes    Pain Score  4     Pain Location  Ankle    Pain Orientation  Right    Pain Descriptors / Indicators  Aching;Dull    Pain Onset  Yesterday    Aggravating Factors   Active DF    Pain Relieving Factors  rest                      [facility] Adult PT Treatment/Exercise - 03/14/17 0845      Ambulation/Gait   Ambulation/Gait  Yes    Ambulation/Gait Assistance  5: Supervision    Ambulation/Gait Assistance Details  Pt amb without AD  or AFO     Ambulation Distance (Feet)  120 Feet breaks for other standing theraputic exercise( no sitting)    Assistive device  Straight cane;None    Gait Pattern  Step-through pattern;Decreased stride length;Decreased weight shift to right;Wide base of support;Decreased trunk rotation;Right foot flat    Ambulation Surface  Level;Indoor    Stairs  Yes    Stairs Assistance  4: Min guard    Stair Management Technique  Alternating pattern;No rails Single rail used for balance catch going down 6" step    Number of Stairs  4    Height of Stairs  6 "    Gait Comments  pt requires VC for knee/hip flexion to clear small objects and steps. pt instructed to use step to pattern at home for safety       High Level Balance   High Level Balance Activities  Braiding;Head turns;Negotiating over obstacles;Other (comment)    High Level Balance Comments  pt required verbal cueing  and demonstration for step pattern during braiding  and head turning.  step over 3 small blocks in Parallel bars 4x(min guard      Neuro Re-ed    Neuro Re-ed Details   High  level balance training           Balance Exercises - 03/14/17 1155      Balance Exercises: Standing   Rockerboard  Anterior/posterior;Head turns;30 seconds;Other reps (comment);Intermittent UE support balance 1-2' each. eyes open/closed.     Other Standing Exercises  On rockerboard: horizontal/vertical head turns (3x5ea), Standing eye closed 3x15", Standing without arm support 3x30", Ant/post weight shifts 3x1'.        PT Education - 03/14/17 1205    Education provided  Yes    Education Details  Pt instructed to focus on R knee/hip flexion to clear objects on floor at home as well as to address toe catch on first step going up stairs.  Pt also instructed to use step to pattern while navigating stairs at home for safety.     Person(s) Educated  Patient    Methods  Explanation;Demonstration;Verbal cues    Comprehension  Verbalized understanding       PT Short Term Goals - 03/06/17 0943      PT SHORT TERM GOAL #1   Title  Pt will be IND in HEP to improve strength, balance, endurance, and flexibililty TARGET FOR ALL STGS: 3 TREATMENT SESSIONS.    Baseline  Pt performing HEP IND at home.     Status  Achieved      PT SHORT TERM GOAL #2   Title  Trial R AFO and determine best AD for amb. and safety during mobility.     Baseline  PT and pt have trialed R AFO braces and pt demonstrates improve R toe clearance with AFO vs. no AFO. However, we are awaiting orthotist consult so goal is only partially met.     Status  Partially Met      PT SHORT TERM GOAL #3   Title  Pt will improve gait speed with LRAD and R AFO/brace to >/=2.62 ft/sec. to safely amb. in the community.     Baseline  2.36f/sec and 2.81 ft/sec. with  R AFO donned and no AD    Status  Achieved        PT Long Term Goals - 03/06/17 0947      PT LONG TERM GOAL #1   Title  Pt will be IND in progressed HEP to improve strength, balance, endurance, and flexibililty. TARGET DATE FOR ALL LTGS: after 12 additional PT  visits-awaiting Medicaid approval for additional visits.     Baseline  No HEP  Status  New      PT LONG TERM GOAL #2   Title  Pt will amb. 600' over even and paved surfaces with LRAD and AFO, at MOD I level, in order to improve functional mobility and to care for son.     Baseline  100' with S for safety, over even terrain.    Status  New      PT LONG TERM GOAL #3   Title  Pt will improve DGI score to >/=20/24 with AFO/brace donned to decr. falls risk.     Baseline  11/24    Status  New      PT LONG TERM GOAL #4   Title  Pt will perform TUG in </=13.5 sec. with LRAD and AFO to decr. falls risk.     Baseline  15.88 sec. no AD    Status  New            Plan - 03/14/17 0920    Clinical Impression Statement  Pt continues to tolerate  high level balance training ( Rb balance exercises) though increased weight shifts on to RLE, and not requiring any rest breaks during this therapy session( displaying increase in activity tolerance and muscular endurance). Pt demonstrates initial apprehension during new balance exercise but increase confidence as session continue. Pt will continue to benefit from skilled PT intervention to address issues of balance and gait endurance.    Rehab Potential  Good    Clinical Impairments Affecting Rehab Potential  see above.    PT Frequency  2x / week    PT Duration  4 weeks    PT Treatment/Interventions  ADLs/Self Care Home Management;Biofeedback;Electrical Stimulation;Therapeutic activities;Therapeutic exercise;Manual techniques;Vestibular;Functional mobility training;Stair training;Gait training;Patient/family education;Orthotic Fit/Training;DME Instruction;Neuromuscular re-education;Balance training    PT Next Visit Plan  Pt has AFO fitting on 2/14 _0 :45. Pt will continue to work on high level balance tasks( step over objects, RB balance,) as well as 8" step training to simulate home stairs.    Consulted and Agree with Plan of Care  Patient        Patient will benefit from skilled therapeutic intervention in order to improve the following deficits and impairments:  Abnormal gait, Decreased endurance, Impaired sensation, Decreased knowledge of use of DME, Decreased strength, Obesity, Decreased balance, Decreased mobility, Impaired flexibility, Postural dysfunction, Decreased range of motion  Visit Diagnosis: Right foot drop  Other abnormalities of gait and mobility  Muscle weakness (generalized)  Other disturbances of skin sensation  Chronic right-sided low back pain with right-sided sciatica     Problem List Patient Active Problem List   Diagnosis Date Noted  . Right thigh pain 01/17/2017  . Right foot drop 12/23/2016  . Chronic right-sided low back pain with right-sided sciatica 12/23/2016  . Pedal edema 10/07/2016  . Endometrial cancer ([place]) 08/05/2016  . Superficial postoperative wound infection 07/22/2016  . 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 Diabetic neuropathy ([place]) 05/27/2016  . Anemia 05/08/2016  . DUB (dysfunctional uterine bleeding) 05/08/2016  . Type 2 diabetes mellitus ([place]) 02/16/2016  . Morbid obesity ([place]) 04/12/2015  . Depression 04/12/2015  . Asthma 03/30/2015  . Hypoxemia 03/30/2015  . Hypothyroidism 03/30/2015  . Thrombocytopenia ([place]) 03/30/2015  . Hypokalemia 03/30/2015    Waunita Schooner SPT 03/14/2017, 12:19 PM  [facility] [address] [place], [place], [address] Phone: [phone]   Fax:  [phone]  Name: Lorree Millar MRN: [id number] Date of Birth: 1970-12-09

## 2017-03-14 NOTE — Telephone Encounter (Signed)
Reviewed patient's chart and confirmed with Dr. Krista Blue.  She is okay to take oxycodone along with the prescriptions provided by our office.

## 2017-03-18 ENCOUNTER — Ambulatory Visit: Payer: Medicaid Other

## 2017-03-18 ENCOUNTER — Ambulatory Visit: Payer: Medicaid Other | Admitting: Neurology

## 2017-03-21 ENCOUNTER — Ambulatory Visit: Payer: Medicaid Other

## 2017-03-21 DIAGNOSIS — M21371 Foot drop, right foot: Secondary | ICD-10-CM | POA: Diagnosis not present

## 2017-03-21 DIAGNOSIS — M5441 Lumbago with sciatica, right side: Principal | ICD-10-CM

## 2017-03-21 DIAGNOSIS — G8929 Other chronic pain: Secondary | ICD-10-CM

## 2017-03-21 DIAGNOSIS — R2689 Other abnormalities of gait and mobility: Secondary | ICD-10-CM

## 2017-03-21 NOTE — Therapy (Signed)
[facility] [address] [place], [place], [address] Phone: [phone]   Fax:  [phone]  Physical Therapy Treatment  Patient Details  Name: Janet [name] MRN: [id number] Date of Birth: Aug 17, 1970 Referring Provider: Dr. Krista Blue   Encounter Date: 03/21/2017  PT End of Session - 03/21/17 1142    Visit Number  6    Number of Visits  12    Date for PT Re-Evaluation  04/10/17    Authorization Type  Medicaid: 12 visits approved through 03/14/2017--04/10/2017    Authorization - Visit Number  6    Authorization - Number of Visits  12    PT Start Time  0846    PT Stop Time  0932    PT Time Calculation (min)  46 min    Activity Tolerance  No increased pain;Patient limited by pain    Behavior During Therapy  [facility] for tasks assessed/performed       Past Medical History:  Diagnosis Date  . Anemia   . Asthma   . Depression   . Hypothyroidism   . Pre-diabetes   . Right foot drop   . Sleep apnea   . Thyroid disease   . Uterine cancer [facility])     Past Surgical History:  Procedure Laterality Date  . CARPAL TUNNEL RELEASE    . CESAREAN SECTION    . CHOLECYSTECTOMY    . cyst removal     uterus  . DILATION AND CURETTAGE OF UTERUS     still birth  . DILATION AND CURETTAGE OF UTERUS N/A 06/11/2016   Procedure: DILATATION AND CURETTAGE;  Surgeon: Emily Filbert, MD;  Location: [facility];  Service: Gynecology;  Laterality: N/A;  . ROBOTIC ASSISTED TOTAL HYSTERECTOMY WITH BILATERAL SALPINGO OOPHERECTOMY Bilateral 07/09/2016   Procedure: XI ROBOTIC ASSISTED TOTAL HYSTERECTOMY WITH BILATERAL SALPINGO OOPHORECTOMY FOR UTERUS GREATER THAN 250 GRAMS, LYSIS OF ADHESIONS;  Surgeon: Everitt Amber, MD;  Location: [facility];  Service: Gynecology;  Laterality: Bilateral;  . SINUS EXPLORATION    . WISDOM TOOTH EXTRACTION      There were no vitals filed for this visit.  Subjective Assessment - 03/21/17 0852    Subjective  pt reports an increase in  her LBP and posterior L knee pain that started after last session. pt reports she has been unable to reduce LBP and has been frustrated with amount of pain she continues to have     Pertinent History  Asthma, hypothyroidism, diabetic neuropathy (N/T starting below knee to feet), chronic R LBP with sciatic, hx endometrial CA (MD just cleared pt) with hysterectomy, thrombocytopenia, depression, anemia, sleep apnea    Patient Stated Goals  To walk longer distances and without pain (and normal)    Currently in Pain?  Yes    Pain Score  6     Pain Location  Knee    Pain Orientation  Left    Pain Descriptors / Indicators  Aching;Dull;Sore    Pain Type  Acute pain    Pain Onset  Other (comment) last session     Pain Frequency  Constant    Aggravating Factors   Flexion and stretcing     Pain Relieving Factors  rest    Multiple Pain Sites  Yes    Pain Score  8 Down to 6 end of this session     Pain Location  Back    Pain Orientation  Lower    Pain Descriptors / Indicators  Aching;Dull;Spasm  Pain Type  Chronic pain    Pain Onset  In the past 7 days    Pain Frequency  Constant    Aggravating Factors   sitting, walking    Pain Relieving Factors  unknown                Therex: Supine with pt reporting decr. In LBP from 8/10 to 6/10 at end of session. Pt required rest breaks 2/2 pt's fear of incr. Pain.  -Lower trunk rotation 2x15 reps to each side.. -B Single knee to chest stretch 3x30 sec.hold (PT performing stretch) -Ant/post pelvic tilts x5 reps, then focused on posterior pelvic tilts only with diaphragmatic breathing x10 reps.  -Sidelying<>sit and supine<>sit: PT educated pt sidelying<>sit will help to reduce LBP. Pt very guarded and slow during all exercises.  Please see pt instructions for HEP details (breathing and LTR).          Self Care:  PT Education - 03/21/17 1141    Education provided  Yes    Education Details  PT provided pt with exercises to decr. pain, as  this is limiting pt's ability to amb. and perform HEP at home. PT reviewed proper bed mobility (sidelying<>supine), sleeping with body pillow, and postural corrections to reducing LBP. PT discussed the use of heat/ice to reduce pain in a safe manner and explained the importance of trying gentle exercises to reduce back pain vs. no activity.    Person(s) Educated  Patient    Methods  Explanation;Demonstration;Verbal cues;Handout;Tactile cues    Comprehension  Returned demonstration;Verbalized understanding;Need further instruction       PT Short Term Goals - 03/06/17 0943      PT SHORT TERM GOAL #1   Title  Pt will be IND in HEP to improve strength, balance, endurance, and flexibililty TARGET FOR ALL STGS: 3 TREATMENT SESSIONS.    Baseline  Pt performing HEP IND at home.     Status  Achieved      PT SHORT TERM GOAL #2   Title  Trial R AFO and determine best AD for amb. and safety during mobility.     Baseline  PT and pt have trialed R AFO braces and pt demonstrates improve R toe clearance with AFO vs. no AFO. However, we are awaiting orthotist consult so goal is only partially met.     Status  Partially Met      PT SHORT TERM GOAL #3   Title  Pt will improve gait speed with LRAD and R AFO/brace to >/=2.62 ft/sec. to safely amb. in the community.     Baseline  2.63f/sec and 2.81 ft/sec. with  R AFO donned and no AD    Status  Achieved        PT Long Term Goals - 03/21/17 1148      PT LONG TERM GOAL #1   Title  Pt will be IND in progressed HEP to improve strength, balance, endurance, and flexibililty. TARGET DATE FOR ALL LTGS: 04/10/17, 12 total visits approved, submit for more as indicated.    Baseline  No HEP    Status  New      PT LONG TERM GOAL #2   Title  Pt will amb. 600' over even and paved surfaces with LRAD and AFO, at MOD I level, in order to improve functional mobility and to care for son.     Baseline  100' with S for safety, over even terrain.    Status  New  PT  LONG TERM GOAL #3   Title  Pt will improve DGI score to >/=20/24 with AFO/brace donned to decr. falls risk.     Baseline  11/24    Status  New      PT LONG TERM GOAL #4   Title  Pt will perform TUG in </=13.5 sec. with LRAD and AFO to decr. falls risk.     Baseline  15.88 sec. no AD    Status  New            Plan - 03/21/17 1143    Clinical Impression Statement  Skilled session focused on reducing pt's LBP (pt has hx of chronic LBP) today, as this is limiting pt's ability to perform amb. and HEP. Pt's LBP likley 2/2 core weakness and incr. body habitus, as last session required standing for prolonged periods of time. Pt's L knee pain likely 2/2 incr. lateral weight shifting onto LLE during gait and standing 2/2 RLE weakness. Pt demonstrated progress during session as she reported LBP reduced to 6/10 at end of session. PT will modify exercises as indicated, as pt's pain limits pt's ability to participate in PT. Continue with POC.     Rehab Potential  Good    Clinical Impairments Affecting Rehab Potential  see above.    PT Frequency  2x / week    PT Duration  4 weeks    PT Treatment/Interventions  ADLs/Self Care Home Management;Biofeedback;Electrical Stimulation;Therapeutic activities;Therapeutic exercise;Manual techniques;Vestibular;Functional mobility training;Stair training;Gait training;Patient/family education;Orthotic Fit/Training;DME Instruction;Neuromuscular re-education;Balance training    PT Next Visit Plan  Pt has AFO fitting on 2/14 _0 :45. Address pain if limiting pt's ability to perform activities, if not painful continue to work on high level balance tasks( step over objects, foam balance activities) hold stairs until knee pain and LBP decr.    Consulted and Agree with Plan of Care  Patient       Patient will benefit from skilled therapeutic intervention in order to improve the following deficits and impairments:  Abnormal gait, Decreased endurance, Impaired sensation,  Decreased knowledge of use of DME, Decreased strength, Obesity, Decreased balance, Decreased mobility, Impaired flexibility, Postural dysfunction, Decreased range of motion  Visit Diagnosis: Chronic right-sided low back pain with right-sided sciatica  Other abnormalities of gait and mobility     Problem List Patient Active Problem List   Diagnosis Date Noted  . Right thigh pain 01/17/2017  . Right foot drop 12/23/2016  . Chronic right-sided low back pain with right-sided sciatica 12/23/2016  . Pedal edema 10/07/2016  . Endometrial cancer ([place]) 08/05/2016  . Superficial postoperative wound infection 07/22/2016  . 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 Diabetic neuropathy ([place]) 05/27/2016  . Anemia 05/08/2016  . DUB (dysfunctional uterine bleeding) 05/08/2016  . Type 2 diabetes mellitus ([place]) 02/16/2016  . Morbid obesity ([place]) 04/12/2015  . Depression 04/12/2015  . Asthma 03/30/2015  . Hypoxemia 03/30/2015  . Hypothyroidism 03/30/2015  . Thrombocytopenia ([place]) 03/30/2015  . Hypokalemia 03/30/2015    Waunita Schooner, SPT  [facility] [address] [place], [place], [address] Phone: [phone]   Fax:  [phone]  Name: Janet [name] MRN: [id number] Date of Birth: 30-Jan-1971  During this treatment session, the physical therapist was present, participating in and directing the treatment.  Geoffry Paradise, PT,DPT 03/21/17 11:55 AM Phone: [phone] Fax: [phone]

## 2017-03-21 NOTE — Patient Instructions (Signed)
Lower Trunk Rotation Stretch    Keeping back flat and feet together, rotate knees to left side.  Rocking hips side to side for 1-2 min. 3 x day. Try for first thing in the morning   http://orth.exer.us/123   Copyright  VHI. All rights reserved.  Diaphragmatic Breathing - Supine    Hands on top of navel, lips closed, breathe in through nose filling stomach with air, navel moves out toward hands. Exhale through puckered lips, hands follow exhalation in. Repeat __5-10_ times. 4 seconds breathing out . Out and 2 seconds breathing in  Do _3-4x__ times per day.  Copyright  VHI. All rights reserved.

## 2017-03-25 ENCOUNTER — Encounter: Payer: Self-pay | Admitting: Physical Therapy

## 2017-03-25 ENCOUNTER — Telehealth: Payer: Self-pay | Admitting: Family Medicine

## 2017-03-25 ENCOUNTER — Ambulatory Visit: Payer: Medicaid Other | Admitting: Physical Therapy

## 2017-03-25 DIAGNOSIS — M21371 Foot drop, right foot: Secondary | ICD-10-CM | POA: Diagnosis not present

## 2017-03-25 DIAGNOSIS — R2689 Other abnormalities of gait and mobility: Secondary | ICD-10-CM

## 2017-03-25 NOTE — Telephone Encounter (Signed)
Patient called and requested for listed medications to be refilled.  Gabapentin ([phone]  (900mg ) BID.  [facility] on [address] in [place], [place].

## 2017-03-25 NOTE — Patient Instructions (Signed)
  Tips for home exercises:  When stretching, go to a MILD stretch, hold for 30-60 seconds.  When doing wall stretch, do also with back knee bent. (Or can do seated by pulling your foot back underneath you with foot flat).   Use folded towel for corner balance exercises. You should be swaying a bit to be challenging your ear to get stronger.   Make sure you are turning your head and not just your eyes.

## 2017-03-25 NOTE — Therapy (Signed)
[facility] [address] [place], [place], [address] Phone: [phone]   Fax:  [phone]  Physical Therapy Treatment  Patient Details  Name: Janet [name] MRN: [id number] Date of Birth: 1970/12/02 Referring Provider: Dr. Krista Blue   Encounter Date: 03/25/2017  PT End of Session - 03/25/17 2057    Visit Number  7    Number of Visits  12    Date for PT Re-Evaluation  04/10/17    Authorization Type  Medicaid: 12 visits approved through 03/14/2017--04/10/2017    Authorization - Visit Number  7    Authorization - Number of Visits  12    PT Start Time  0845    PT Stop Time  0930    PT Time Calculation (min)  45 min    Activity Tolerance  Patient tolerated treatment well;Patient limited by pain left knee pain     Behavior During Therapy  [facility] for tasks assessed/performed       Past Medical History:  Diagnosis Date  . Anemia   . Asthma   . Depression   . Hypothyroidism   . Pre-diabetes   . Right foot drop   . Sleep apnea   . Thyroid disease   . Uterine cancer [facility])     Past Surgical History:  Procedure Laterality Date  . CARPAL TUNNEL RELEASE    . CESAREAN SECTION    . CHOLECYSTECTOMY    . cyst removal     uterus  . DILATION AND CURETTAGE OF UTERUS     still birth  . DILATION AND CURETTAGE OF UTERUS N/A 06/11/2016   Procedure: DILATATION AND CURETTAGE;  Surgeon: Emily Filbert, MD;  Location: [facility];  Service: Gynecology;  Laterality: N/A;  . ROBOTIC ASSISTED TOTAL HYSTERECTOMY WITH BILATERAL SALPINGO OOPHERECTOMY Bilateral 07/09/2016   Procedure: XI ROBOTIC ASSISTED TOTAL HYSTERECTOMY WITH BILATERAL SALPINGO OOPHORECTOMY FOR UTERUS GREATER THAN 250 GRAMS, LYSIS OF ADHESIONS;  Surgeon: Everitt Amber, MD;  Location: [facility];  Service: Gynecology;  Laterality: Bilateral;  . SINUS EXPLORATION    . WISDOM TOOTH EXTRACTION      There were no vitals filed for this visit.  Subjective Assessment - 03/25/17 0848    Subjective  Feeling better and exercises are helping. When asked about stairs she states "they are not my friend" and reports 2 falls on stairs. Reports pain and cramping in rt her gastroc throughout the day and expecially during stretching. Feels rt calf is swollen and feels more firm than left    Pertinent History  Asthma, hypothyroidism, diabetic neuropathy (N/T starting below knee to feet), chronic R LBP with sciatic, hx endometrial CA (MD just cleared pt) with hysterectomy, thrombocytopenia, depression, anemia, sleep apnea, left leg 1.5 inches shorter (used a built up shoe in highschool)    Patient Stated Goals  To walk longer distances and without pain (and normal)    Currently in Pain?  Yes    Pain Score  5     Pain Location  Knee    Pain Orientation  Left    Pain Descriptors / Indicators  Aching;Dull;Sore    Pain Type  Chronic pain    Pain Onset  More than a month ago last session     Pain Frequency  Constant    Aggravating Factors   flexion and standing    Pain Relieving Factors  rest    Multiple Pain Sites  Yes    Pain Score  5    Pain Location  Back    Pain Orientation  Lower    Pain Descriptors / Indicators  Aching;Dull;Sore    Pain Type  Chronic pain    Pain Onset  More than a month ago    Pain Frequency  Intermittent    Aggravating Factors   sitting, walking    Pain Relieving Factors  rest, pain medds         [facility] PT Assessment - 03/25/17 0001      Special Tests    Special Tests  Swelling Tests    Swelling Tests  other      other   Comments  measured bil calves at 25 cm above lateral malleolus RT 50.2 cm  Lt 50.7cm      on palpation rt gastroc is more firm/tense from below knee to ~6 inches above achilles compared to her left. No warmth or redness.             [facility] Adult PT Treatment/Exercise - 03/25/17 0001      Ambulation/Gait   Ambulation/Gait Assistance  5: Supervision    Ambulation/Gait Assistance Details  without cane; as warm-up prior to calf  stretches    Ambulation Distance (Feet)  80 Feet 120    Assistive device  Straight cane;None    Gait Pattern  Step-through pattern;Decreased stride length;Decreased weight shift to right;Wide base of support;Decreased trunk rotation;Right foot flat    Ambulation Surface  Level;Indoor      Exercises   Exercises  Ankle      Ankle Exercises: Stretches   Soleus Stretch  3 reps;30 seconds standing at wall; seated    Gastroc Stretch  3 reps;30 seconds standing at wall          Balance Exercises - 03/25/17 2052      Balance Exercises: Standing   Standing Eyes Opened  Narrow base of support (BOS);Wide (BOA);Head turns;Foam/compliant surface;Solid surface        PT Education - 03/25/17 2056    Education provided  Yes    Education Details  only take stretches to point of MILD stretch; see also pt instructions    Person(s) Educated  Patient    Methods  Explanation;Demonstration;Verbal cues;Handout    Comprehension  Verbalized understanding;Returned demonstration;Verbal cues required;Need further instruction       PT Short Term Goals - 03/06/17 0943      PT SHORT TERM GOAL #1   Title  Pt will be IND in HEP to improve strength, balance, endurance, and flexibililty TARGET FOR ALL STGS: 3 TREATMENT SESSIONS.    Baseline  Pt performing HEP IND at home.     Status  Achieved      PT SHORT TERM GOAL #2   Title  Trial R AFO and determine best AD for amb. and safety during mobility.     Baseline  PT and pt have trialed R AFO braces and pt demonstrates improve R toe clearance with AFO vs. no AFO. However, we are awaiting orthotist consult so goal is only partially met.     Status  Partially Met      PT SHORT TERM GOAL #3   Title  Pt will improve gait speed with LRAD and R AFO/brace to >/=2.62 ft/sec. to safely amb. in the community.     Baseline  2.40f/sec and 2.81 ft/sec. with  R AFO donned and no AD    Status  Achieved        PT Long Term Goals - 03/21/17 1148  PT LONG TERM  GOAL #1   Title  Pt will be IND in progressed HEP to improve strength, balance, endurance, and flexibililty. TARGET DATE FOR ALL LTGS: 04/10/17, 12 total visits approved, submit for more as indicated.    Baseline  No HEP    Status  New      PT LONG TERM GOAL #2   Title  Pt will amb. 600' over even and paved surfaces with LRAD and AFO, at MOD I level, in order to improve functional mobility and to care for son.     Baseline  100' with S for safety, over even terrain.    Status  New      PT LONG TERM GOAL #3   Title  Pt will improve DGI score to >/=20/24 with AFO/brace donned to decr. falls risk.     Baseline  11/24    Status  New      PT LONG TERM GOAL #4   Title  Pt will perform TUG in </=13.5 sec. with LRAD and AFO to decr. falls risk.     Baseline  15.88 sec. no AD    Status  New            Plan - 03/25/17 2058    Clinical Impression Statement  Patient remains limited in her balance due to multiple factors: neuropathy, left knee pain, LE weakness, body habitus (pear-shaped), and ROM limitations. She can continue to benefit from PT to address these impairments with goal to improve balance and decrease fall risk.     Rehab Potential  Good    Clinical Impairments Affecting Rehab Potential  see above.    PT Frequency  2x / week    PT Duration  4 weeks    PT Treatment/Interventions  ADLs/Self Care Home Management;Biofeedback;Electrical Stimulation;Therapeutic activities;Therapeutic exercise;Manual techniques;Vestibular;Functional mobility training;Stair training;Gait training;Patient/family education;Orthotic Fit/Training;DME Instruction;Neuromuscular re-education;Balance training    PT Next Visit Plan  Pt has AFO fitting on 2/14 _0 :45. Address pain if limiting pt's ability to perform activities, if not painful continue to work on high level balance tasks( step over objects, foam balance activities) hold stairs until knee pain and LBP decr.    Consulted and Agree with Plan of Care   Patient       Patient will benefit from skilled therapeutic intervention in order to improve the following deficits and impairments:  Abnormal gait, Decreased endurance, Impaired sensation, Decreased knowledge of use of DME, Decreased strength, Obesity, Decreased balance, Decreased mobility, Impaired flexibility, Postural dysfunction, Decreased range of motion  Visit Diagnosis: Other abnormalities of gait and mobility  Right foot drop     Problem List Patient Active Problem List   Diagnosis Date Noted  . Right thigh pain 01/17/2017  . Right foot drop 12/23/2016  . Chronic right-sided low back pain with right-sided sciatica 12/23/2016  . Pedal edema 10/07/2016  . Endometrial cancer ([place]) 08/05/2016  . Superficial postoperative wound infection 07/22/2016  . 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 Diabetic neuropathy ([place]) 05/27/2016  . Anemia 05/08/2016  . DUB (dysfunctional uterine bleeding) 05/08/2016  . Type 2 diabetes mellitus ([place]) 02/16/2016  . Morbid obesity ([place]) 04/12/2015  . Depression 04/12/2015  . Asthma 03/30/2015  . Hypoxemia 03/30/2015  . Hypothyroidism 03/30/2015  . Thrombocytopenia ([place]) 03/30/2015  . Hypokalemia 03/30/2015    Rexanne Mano, PT 03/25/2017, 9:13 PM  [facility] [address] [place], [place], [address] Phone: [phone]   Fax:  [phone]  Name: Janet [name] MRN: [id number] Date of Birth: Sep 01, 1970

## 2017-03-25 NOTE — Telephone Encounter (Signed)
LVM to return call regarding gabapentin.

## 2017-03-25 NOTE — Telephone Encounter (Signed)
She was prescribed Lyrica and should not be taking gabapentin as well

## 2017-03-26 ENCOUNTER — Ambulatory Visit: Payer: Medicaid Other | Admitting: Family Medicine

## 2017-03-27 ENCOUNTER — Ambulatory Visit: Payer: Medicaid Other

## 2017-03-27 DIAGNOSIS — R2689 Other abnormalities of gait and mobility: Secondary | ICD-10-CM

## 2017-03-27 DIAGNOSIS — M21371 Foot drop, right foot: Secondary | ICD-10-CM

## 2017-03-27 DIAGNOSIS — M6281 Muscle weakness (generalized): Secondary | ICD-10-CM

## 2017-03-27 NOTE — Therapy (Signed)
[facility] [address] [place], [place], [address] Phone: [phone]   Fax:  [phone]  Physical Therapy Treatment  Patient Details  Name: Janet [name] MRN: [id number] Date of Birth: [birth year] Referring Provider: Dr. Krista Blue   Encounter Date: 03/27/2017  PT End of Session - 03/27/17 1318    Visit Number  8    Number of Visits  12    Date for PT Re-Evaluation  04/10/17    Authorization Type  Medicaid: 12 visits approved through 03/14/2017--04/10/2017    PT Start Time  0850    PT Stop Time  0930    PT Time Calculation (min)  40 min    Equipment Utilized During Treatment  Gait belt    Activity Tolerance  Patient tolerated treatment well;Patient limited by pain;Patient limited by fatigue    Behavior During Therapy  [facility] for tasks assessed/performed       Past Medical History:  Diagnosis Date  . Anemia   . Asthma   . Depression   . Hypothyroidism   . Pre-diabetes   . Right foot drop   . Sleep apnea   . Thyroid disease   . Uterine cancer [facility])     Past Surgical History:  Procedure Laterality Date  . CARPAL TUNNEL RELEASE    . CESAREAN SECTION    . CHOLECYSTECTOMY    . cyst removal     uterus  . DILATION AND CURETTAGE OF UTERUS     still birth  . DILATION AND CURETTAGE OF UTERUS N/A 06/11/2016   Procedure: DILATATION AND CURETTAGE;  Surgeon: Emily Filbert, MD;  Location: [facility];  Service: Gynecology;  Laterality: N/A;  . ROBOTIC ASSISTED TOTAL HYSTERECTOMY WITH BILATERAL SALPINGO OOPHERECTOMY Bilateral 07/09/2016   Procedure: XI ROBOTIC ASSISTED TOTAL HYSTERECTOMY WITH BILATERAL SALPINGO OOPHORECTOMY FOR UTERUS GREATER THAN 250 GRAMS, LYSIS OF ADHESIONS;  Surgeon: Everitt Amber, MD;  Location: [facility];  Service: Gynecology;  Laterality: Bilateral;  . SINUS EXPLORATION    . WISDOM TOOTH EXTRACTION      There were no vitals filed for this visit.  Subjective Assessment - 03/27/17 0854    Subjective  Pt reports  no fall since last PT session. She reports significant reduction in LBP and as been ambulating without her cane. Pt is still apprehensive about some of her exercises(step ups and stairs) and worry of LBP and LOB     Pertinent History  Asthma, hypothyroidism, diabetic neuropathy (N/T starting below knee to feet), chronic R LBP with sciatic, hx endometrial CA (MD just cleared pt) with hysterectomy, thrombocytopenia, depression, anemia, sleep apnea, left leg 1.5 inches shorter (used a built up shoe in highschool)    Patient Stated Goals  To walk longer distances and without pain (and normal)    Currently in Pain?  Yes    Pain Score  3     Pain Location  Back    Pain Orientation  Lower    Pain Descriptors / Indicators  Aching;Sore    Pain Type  Chronic pain    Pain Onset  More than a month ago    Aggravating Factors   flexion( trunk) and standing     Pain Relieving Factors  rest                [facility] Adult PT Treatment/Exercise - 03/27/17 0912      Transfers   Transfers  --      Ambulation/Gait   Ambulation/Gait  Yes  Ambulation/Gait Assistance  5: Supervision;4: Min guard min G  for gait with scanning     Ambulation/Gait Assistance Details  Pt amb. with R ant. toe off AFO with orthotist, Gerald Stabs, present to help determine best AFO for pt.     Ambulation Distance (Feet)  530 Feet longest 590 with one sitting rest break<102mn at 530    Assistive device  Straight cane;Other (Comment) R ant. AFO     Gait Pattern  Step-through pattern;Decreased stride length;Decreased weight shift to right;Wide base of support;Decreased trunk rotation;Right foot flat    Ambulation Surface  Level;Indoor;Outdoor;Unlevel;Paved    Stairs  --    Stairs Assistance  --    Stair Management Technique  --    Number of Stairs  --    Height of Stairs  --    Door Management  6: Modified independent (Device/Increase time)    Ramp  5: Supervision    Curb  Other (comment);5: Supervision Min guard, VC for cane and  foot placement.     Curb Details (indicate cue type and reason)  Cues for sequencing with SPC. Performed x15 reps (4" step and outside curb) with cues for sequencing with SPC.    Gait Comments  amb distance 230 indoors no breaks and 590 indoors and outdoors with 1 sitting rest break at 530.      High Level Balance   High Level Balance Activities  Turns;Head turns    High Level Balance Comments  pt requires verbal cues for continuous scanning and gait.  pt had to take 2x standing  pause to keep from LOB      Neuro Re-ed    Neuro Re-ed Details   --               PT Education - 03/27/17 1316    Education Details  AFO fitting, AFO use, Safe ambulatory distances and s/s to take a seated rest break. Methods (HEP exercises to reduce LBP during pain flare ups). Pt instructed to take rest breaks as she needs them and not to push herself to a point where she feels she will " pay for it tomorrow".     Person(s) Educated  Patient    Methods  Explanation;Demonstration;Tactile cues    Comprehension  Verbalized understanding;Returned demonstration;Need further instruction       PT Short Term Goals - 03/06/17 0943      PT SHORT TERM GOAL #1   Title  Pt will be IND in HEP to improve strength, balance, endurance, and flexibililty TARGET FOR ALL STGS: 3 TREATMENT SESSIONS.    Baseline  Pt performing HEP IND at home.     Status  Achieved      PT SHORT TERM GOAL #2   Title  Trial R AFO and determine best AD for amb. and safety during mobility.     Baseline  PT and pt have trialed R AFO braces and pt demonstrates improve R toe clearance with AFO vs. no AFO. However, we are awaiting orthotist consult so goal is only partially met.     Status  Partially Met      PT SHORT TERM GOAL #3   Title  Pt will improve gait speed with LRAD and R AFO/brace to >/=2.62 ft/sec. to safely amb. in the community.     Baseline  2.651fsec and 2.81 ft/sec. with  R AFO donned and no AD    Status  Achieved         PT Long Term  Goals - 03/21/17 1148      PT LONG TERM GOAL #1   Title  Pt will be IND in progressed HEP to improve strength, balance, endurance, and flexibililty. TARGET DATE FOR ALL LTGS: 04/10/17, 12 total visits approved, submit for more as indicated.    Baseline  No HEP    Status  New      PT LONG TERM GOAL #2   Title  Pt will amb. 600' over even and paved surfaces with LRAD and AFO, at MOD I level, in order to improve functional mobility and to care for son.     Baseline  100' with S for safety, over even terrain.    Status  New      PT LONG TERM GOAL #3   Title  Pt will improve DGI score to >/=20/24 with AFO/brace donned to decr. falls risk.     Baseline  11/24    Status  New      PT LONG TERM GOAL #4   Title  Pt will perform TUG in </=13.5 sec. with LRAD and AFO to decr. falls risk.     Baseline  15.88 sec. no AD    Status  New          Plan - 03/27/17 1327    Clinical Impression Statement  Today pt had an AFO fitting, with orthotist present, for a R Anterior AFO to reduce impact of R foot drop. Pt ambulated 590 feet ( indoors/outdoors/ramps./curbs with a single rest break due to increase in Lumbar back pain that quickly resolved with 3 min rest. Pt also ambulated 230 indoors without a rest break. Pt demonstrated significant increase in curb navigation confidence form the start of session to the end with the use of a quad tip cane. During ambulation pt heavily relied on the use of the cane with quad tip to point where she would occasionally need to shake her hand out after walks second to fatigue. Continue with POC.     Rehab Potential  Good    Clinical Impairments Affecting Rehab Potential  see above.    PT Frequency  2x / week    PT Duration  4 weeks    PT Treatment/Interventions  ADLs/Self Care Home Management;Biofeedback;Electrical Stimulation;Therapeutic activities;Therapeutic exercise;Manual techniques;Vestibular;Functional mobility training;Stair training;Gait  training;Patient/family education;Orthotic Fit/Training;DME Instruction;Neuromuscular re-education;Balance training    PT Next Visit Plan  Pt to continue working on gait activities with additional tasks( scanning, step ups, curbs,) and overall ambulation distance. to start working towards [employer]. (2/28)    Consulted and Agree with Plan of Care  Patient          Patient will benefit from skilled therapeutic intervention in order to improve the following deficits and impairments:  Abnormal gait, Decreased endurance, Impaired sensation, Decreased knowledge of use of DME, Decreased strength, Obesity, Decreased balance, Decreased mobility, Impaired flexibility, Postural dysfunction, Decreased range of motion  Visit Diagnosis: Other abnormalities of gait and mobility  Right foot drop  Muscle weakness (generalized)     Problem List Patient Active Problem List   Diagnosis Date Noted  . Right thigh pain 01/17/2017  . Right foot drop 12/23/2016  . Chronic right-sided low back pain with right-sided sciatica 12/23/2016  . Pedal edema 10/07/2016  . Endometrial cancer ([place]) 08/05/2016  . Superficial postoperative wound infection 07/22/2016  . 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 Diabetic neuropathy ([place]) 05/27/2016  . Anemia 05/08/2016  . DUB (dysfunctional uterine bleeding) 05/08/2016  . Type 2  diabetes mellitus ([place]) 02/16/2016  . Morbid obesity ([place]) 04/12/2015  . Depression 04/12/2015  . Asthma 03/30/2015  . Hypoxemia 03/30/2015  . Hypothyroidism 03/30/2015  . Thrombocytopenia ([place]) 03/30/2015  . Hypokalemia 03/30/2015    Waunita Schooner SPT 03/27/2017, 1:31 PM  [facility] [address] [place], [place], [address] Phone: [phone]   Fax:  [phone]  Name: Michela Herst MRN: [id number] Date of Birth: August 03, 1970

## 2017-03-27 NOTE — Telephone Encounter (Signed)
Patient called and was informed

## 2017-03-28 ENCOUNTER — Telehealth: Payer: Self-pay | Admitting: Family Medicine

## 2017-03-28 ENCOUNTER — Telehealth: Payer: Self-pay | Admitting: *Deleted

## 2017-03-28 NOTE — Telephone Encounter (Signed)
PA submitted and approved for Lyrica. Approval #[id number]

## 2017-03-28 NOTE — Telephone Encounter (Signed)
Faxed a copy of the hysterectomy statement to [facility] at [phone]

## 2017-03-28 NOTE — Telephone Encounter (Signed)
Patient called and requested for a prior authorization for the Lyrica. Patient stated she would like to get a refill on the gabapentin until she is able to get the Lyrica. Please fu with patient at your earliest convenience. Patient wanted the lyrica sent to [facility] on [address]

## 2017-03-28 NOTE — Telephone Encounter (Signed)
Will route to stacey.

## 2017-04-01 ENCOUNTER — Ambulatory Visit: Payer: Medicaid Other

## 2017-04-01 DIAGNOSIS — M21371 Foot drop, right foot: Secondary | ICD-10-CM

## 2017-04-01 DIAGNOSIS — M6281 Muscle weakness (generalized): Secondary | ICD-10-CM

## 2017-04-01 DIAGNOSIS — R2689 Other abnormalities of gait and mobility: Secondary | ICD-10-CM

## 2017-04-01 NOTE — Therapy (Signed)
[facility] [address] [place], [place], [address] Phone: [phone]   Fax:  [phone]  Physical Therapy Treatment  Patient Details  Name: Janet [name] MRN: [id number] Date of Birth: 1970-12-09 Referring Provider: Dr. Krista Blue   Encounter Date: 04/01/2017  PT End of Session - 04/01/17 0948    Visit Number  9    Number of Visits  12    Date for PT Re-Evaluation  04/10/17    Authorization Type  Medicaid: 12 visits approved through 03/14/2017--04/10/2017    PT Start Time  0845    PT Stop Time  0930    PT Time Calculation (min)  45 min    Equipment Utilized During Treatment  Gait belt    Activity Tolerance  Patient tolerated treatment well;Patient limited by fatigue    Behavior During Therapy  [facility] for tasks assessed/performed       Past Medical History:  Diagnosis Date  . Anemia   . Asthma   . Depression   . Hypothyroidism   . Pre-diabetes   . Right foot drop   . Sleep apnea   . Thyroid disease   . Uterine cancer [facility])     Past Surgical History:  Procedure Laterality Date  . CARPAL TUNNEL RELEASE    . CESAREAN SECTION    . CHOLECYSTECTOMY    . cyst removal     uterus  . DILATION AND CURETTAGE OF UTERUS     still birth  . DILATION AND CURETTAGE OF UTERUS N/A 06/11/2016   Procedure: DILATATION AND CURETTAGE;  Surgeon: Emily Filbert, MD;  Location: [facility];  Service: Gynecology;  Laterality: N/A;  . ROBOTIC ASSISTED TOTAL HYSTERECTOMY WITH BILATERAL SALPINGO OOPHERECTOMY Bilateral 07/09/2016   Procedure: XI ROBOTIC ASSISTED TOTAL HYSTERECTOMY WITH BILATERAL SALPINGO OOPHORECTOMY FOR UTERUS GREATER THAN 250 GRAMS, LYSIS OF ADHESIONS;  Surgeon: Everitt Amber, MD;  Location: [facility];  Service: Gynecology;  Laterality: Bilateral;  . SINUS EXPLORATION    . WISDOM TOOTH EXTRACTION      There were no vitals filed for this visit.  Subjective Assessment - 04/01/17 0848    Subjective  pt reports reports having a stumble  without fall comingout of her bed room. pt reports catching foot and a throw rug.  pt reports she has been able to reduce her pain on her own with HEP exercies.     Pertinent History  Asthma, hypothyroidism, diabetic neuropathy (N/T starting below knee to feet), chronic R LBP with sciatic, hx endometrial CA (MD just cleared pt) with hysterectomy, thrombocytopenia, depression, anemia, sleep apnea, left leg 1.5 inches shorter (used a built up shoe in highschool)    Patient Stated Goals  To walk longer distances and without pain (and normal)    Currently in Pain?  No/denies                       [facility] Adult PT Treatment/Exercise - 04/01/17 0900      Transfers   Transfers  Sit to Stand;Stand to Sit    Sit to Stand  6: Modified independent (Device/Increase time);With upper extremity assist;From elevated surface;With armrests;From chair/3-in-1;From bed    Stand to Sit  6: Modified independent (Device/Increase time);With upper extremity assist;With armrests;To bed;To chair/3-in-1      Ambulation/Gait   Ambulation/Gait  Yes    Ambulation/Gait Assistance  5: Supervision    Ambulation/Gait Assistance Details  Amb without AFO and with cane    Ambulation Distance (Feet)  500 Feet    Assistive device  Straight cane    Gait Pattern  Step-through pattern;Decreased stride length;Decreased weight shift to right;Wide base of support;Decreased trunk rotation;Right foot flat;Left hip hike more pronounce L hip hike with cane    Ambulation Surface  Level;Indoor;Outdoor;Paved;Unlevel    Ramp  5: Supervision    Curb  Other (comment) minG    Curb Details (indicate cue type and reason)  Pt demo good weight shift over elevated foot without cueing, good sequence without cues    Gait Comments  pt required multiple rest breaks 2/2 mild back pain. pt able to identify when she should rest to avoid significant increase in back pain . 11 laps of gait over foam pads( ~20 ft. ) with minG and verbal cues for heel  strike. Pt also required cues to step close to curb/mat vs. large step (incr. unsteadiness) in order to safely traverse obstacles/compliant surfaces.              Balance Exercises - 04/01/17 0900      Balance Exercises: Standing   Standing Eyes Opened  Foam/compliant surface;Head turns;30 secs;5 reps    Gait with Head Turns  Forward;Foam/compliant surface;4 reps 4 turns each lap( 3 laps) MinG verb cues     Partial Tandem Stance  2 reps;Eyes open;30 secs MinG on foam     Turning  10 reps;Both    Step Over Hurdles / Cones  Step over 1/2 foam rolls.  8 reps 2 foam rolls verbal cue-knee flx. PT demo, MinG, step to-step over patter     Static standing balance activities performed in corner with chair in front of pt and S to ensure safety.   PT Education - 04/01/17 0947    Education provided  Yes    Education Details  walking rest breaks, pain vs. discomfort  and when to take breaks and when to keep walking, knee ice/compression times(20 on 30 off).  Fall reduction.     Methods  Explanation    Comprehension  Verbalized understanding       PT Short Term Goals - 03/06/17 0943      PT SHORT TERM GOAL #1   Title  Pt will be IND in HEP to improve strength, balance, endurance, and flexibililty TARGET FOR ALL STGS: 3 TREATMENT SESSIONS.    Baseline  Pt performing HEP IND at home.     Status  Achieved      PT SHORT TERM GOAL #2   Title  Trial R AFO and determine best AD for amb. and safety during mobility.     Baseline  PT and pt have trialed R AFO braces and pt demonstrates improve R toe clearance with AFO vs. no AFO. However, we are awaiting orthotist consult so goal is only partially met.     Status  Partially Met      PT SHORT TERM GOAL #3   Title  Pt will improve gait speed with LRAD and R AFO/brace to >/=2.62 ft/sec. to safely amb. in the community.     Baseline  2.9f/sec and 2.81 ft/sec. with  R AFO donned and no AD    Status  Achieved        PT Long Term Goals -  03/21/17 1148      PT LONG TERM GOAL #1   Title  Pt will be IND in progressed HEP to improve strength, balance, endurance, and flexibililty. TARGET DATE FOR ALL LTGS: 04/10/17, 12 total visits approved, submit for  more as indicated.    Baseline  No HEP    Status  New      PT LONG TERM GOAL #2   Title  Pt will amb. 600' over even and paved surfaces with LRAD and AFO, at MOD I level, in order to improve functional mobility and to care for son.     Baseline  100' with S for safety, over even terrain.    Status  New      PT LONG TERM GOAL #3   Title  Pt will improve DGI score to >/=20/24 with AFO/brace donned to decr. falls risk.     Baseline  11/24    Status  New      PT LONG TERM GOAL #4   Title  Pt will perform TUG in </=13.5 sec. with LRAD and AFO to decr. falls risk.     Baseline  15.88 sec. no AD    Status  New            Plan - 04/01/17 5176    Clinical Impression Statement  Today pt demonstrated her increase in tolerance to activity. Pt continues to ambulate 500+ feet before needing a rest break to reduce LB tension. Pt has demonstrated her ability to self identify signs that she needs to take a break.  Pt also continues to demonstrate retention of stepping patterns with curb training without verbal cues for stepping or weight shift. Pt continues to require continual rest breaks thought pt session (1-5 min) preferably in chair with back support to reduce tension in her low back before it progresses to lasting pain. Pt  also continues to demonstrate increased confidence and motivation during high level balance activities. Pt will continue to benefit form skilled pt intervention to address issues noted above.    Rehab Potential  Good    Clinical Impairments Affecting Rehab Potential  see above.    PT Frequency  2x / week    PT Duration  4 weeks    PT Treatment/Interventions  ADLs/Self Care Home Management;Biofeedback;Electrical Stimulation;Therapeutic activities;Therapeutic  exercise;Manual techniques;Vestibular;Functional mobility training;Stair training;Gait training;Patient/family education;Orthotic Fit/Training;DME Instruction;Neuromuscular re-education;Balance training    PT Next Visit Plan  Pt to continue working on gait activities with additional tasks( scanning curbs/step ups as well as ambulation over uneven surfaces).  and overall ambulation distance, progress 550+ if tolerable. to start working towards [employer]. (2/28)    Consulted and Agree with Plan of Care  Patient       Patient will benefit from skilled therapeutic intervention in order to improve the following deficits and impairments:  Abnormal gait, Decreased endurance, Impaired sensation, Decreased knowledge of use of DME, Decreased strength, Obesity, Decreased balance, Decreased mobility, Impaired flexibility, Postural dysfunction, Decreased range of motion  Visit Diagnosis: Other abnormalities of gait and mobility  Right foot drop  Muscle weakness (generalized)     Problem List Patient Active Problem List   Diagnosis Date Noted  . Right thigh pain 01/17/2017  . Right foot drop 12/23/2016  . Chronic right-sided low back pain with right-sided sciatica 12/23/2016  . Pedal edema 10/07/2016  . Endometrial cancer ([place]) 08/05/2016  . Superficial postoperative wound infection 07/22/2016  . 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 Diabetic neuropathy ([place]) 05/27/2016  . Anemia 05/08/2016  . DUB (dysfunctional uterine bleeding) 05/08/2016  . Type 2 diabetes mellitus ([place]) 02/16/2016  . Morbid obesity ([place]) 04/12/2015  . Depression 04/12/2015  . Asthma 03/30/2015  . Hypoxemia 03/30/2015  . Hypothyroidism 03/30/2015  .  Thrombocytopenia ([place]) 03/30/2015  . Hypokalemia 03/30/2015    Waunita Schooner  SPT 04/01/2017, 9:58 AM  [facility] [address] [place], [place], [address] Phone: [phone]   Fax:   [phone]  Name: Janet [name] MRN: [id number] Date of Birth: Jun 17, 1970

## 2017-04-02 ENCOUNTER — Ambulatory Visit: Payer: Medicaid Other | Admitting: Family Medicine

## 2017-04-03 ENCOUNTER — Other Ambulatory Visit: Payer: Self-pay | Admitting: Gynecologic Oncology

## 2017-04-03 DIAGNOSIS — R1907 Generalized intra-abdominal and pelvic swelling, mass and lump: Secondary | ICD-10-CM

## 2017-04-04 ENCOUNTER — Ambulatory Visit: Payer: Medicaid Other

## 2017-04-04 DIAGNOSIS — M21371 Foot drop, right foot: Secondary | ICD-10-CM | POA: Diagnosis not present

## 2017-04-04 DIAGNOSIS — M5441 Lumbago with sciatica, right side: Secondary | ICD-10-CM

## 2017-04-04 DIAGNOSIS — M6281 Muscle weakness (generalized): Secondary | ICD-10-CM

## 2017-04-04 DIAGNOSIS — R2689 Other abnormalities of gait and mobility: Secondary | ICD-10-CM

## 2017-04-04 DIAGNOSIS — G8929 Other chronic pain: Secondary | ICD-10-CM

## 2017-04-04 NOTE — Therapy (Signed)
[facility] [address] [place], [place], [address] Phone: [phone]   Fax:  [phone]  Physical Therapy Treatment  Patient Details  Name: Janet [name] MRN: [id number] Date of Birth: May 09, 1970 Referring Provider: Dr. Krista Blue   Encounter Date: 04/04/2017  PT End of Session - 04/04/17 1310    Visit Number  10    Number of Visits  12    Date for PT Re-Evaluation  04/10/17    Authorization Type  Medicaid: 12 visits approved through 03/14/2017--04/10/2017    Authorization - Visit Number  7    Authorization - Number of Visits  12    PT Start Time  0845    PT Stop Time  0925    PT Time Calculation (min)  40 min    Equipment Utilized During Treatment  Gait belt    Activity Tolerance  Patient tolerated treatment well;Patient limited by fatigue    Behavior During Therapy  [facility] for tasks assessed/performed       Past Medical History:  Diagnosis Date  . Anemia   . Asthma   . Depression   . Hypothyroidism   . Pre-diabetes   . Right foot drop   . Sleep apnea   . Thyroid disease   . Uterine cancer [facility])     Past Surgical History:  Procedure Laterality Date  . CARPAL TUNNEL RELEASE    . CESAREAN SECTION    . CHOLECYSTECTOMY    . cyst removal     uterus  . DILATION AND CURETTAGE OF UTERUS     still birth  . DILATION AND CURETTAGE OF UTERUS N/A 06/11/2016   Procedure: DILATATION AND CURETTAGE;  Surgeon: Emily Filbert, MD;  Location: [facility];  Service: Gynecology;  Laterality: N/A;  . ROBOTIC ASSISTED TOTAL HYSTERECTOMY WITH BILATERAL SALPINGO OOPHERECTOMY Bilateral 07/09/2016   Procedure: XI ROBOTIC ASSISTED TOTAL HYSTERECTOMY WITH BILATERAL SALPINGO OOPHORECTOMY FOR UTERUS GREATER THAN 250 GRAMS, LYSIS OF ADHESIONS;  Surgeon: Everitt Amber, MD;  Location: [facility];  Service: Gynecology;  Laterality: Bilateral;  . SINUS EXPLORATION    . WISDOM TOOTH EXTRACTION      There were no vitals filed for this visit.  Subjective  Assessment - 04/04/17 0849    Subjective  pt reports no falls since last pt session.  pt report she lightly rolled her Right ankle while walking in her yard yesterday but reports she did not fall     Pertinent History  Asthma, hypothyroidism, diabetic neuropathy (N/T starting below knee to feet), chronic R LBP with sciatic, hx endometrial CA (MD just cleared pt) with hysterectomy, thrombocytopenia, depression, anemia, sleep apnea, left leg 1.5 inches shorter (used a built up shoe in highschool)    Patient Stated Goals  To walk longer distances and without pain (and normal)    Currently in Pain?  Yes    Pain Score  5     Pain Location  Ankle    Pain Orientation  Right    Pain Descriptors / Indicators  Sore    Pain Type  Acute pain    Pain Onset  Yesterday    Pain Frequency  Constant    Aggravating Factors   walking     Pain Relieving Factors  rest    Effect of Pain on Daily Activities  walking                   [facility] Adult PT Treatment/Exercise - 04/04/17 3875  Ambulation/Gait   Ambulation/Gait  Yes    Ambulation/Gait Assistance  5: Supervision    Ambulation Distance (Feet)  230 Feet 690 total with rest breaks at 230 2/2 LBP    Assistive device  Straight cane    Gait Pattern  Step-through pattern;Decreased stride length;Decreased weight shift to right;Wide base of support;Decreased trunk rotation;Right foot flat;Left hip hike    Ambulation Surface  Level;Unlevel;Indoor    Curb  Other (comment) MinG    Curb Details (indicate cue type and reason)  PT demonstrated weight shift, Verbal cues  for weight shifting and incr. step length with use of cane. Pt demonstrated increased safety and smoother transition with cane elevated ~1" higher.      High Level Balance   High Level Balance Activities  Turns;Head turns;Negotiating over obstacles;Direction changes red/blue mats (~20'x7)    High Level Balance Comments  Pt requires continuous Verbal cues for maintaining consistent gait  speed with head turns, pt confidence incrase as session progressed with head turns on foam mats with added small hurdle to negotiate obstacles. pt required verbal cues for improved clearance to step over hurdles and demonstrated significant reduction in gait speed to nagivat hurdles as well as changes in floor surface.            Therex: Supine: pelvic tilts with cues to improve technique. See pt instructions for HEP details.       PT Education - 04/04/17 1309    Education provided  Yes    Education Details  use of quad tip cane and where to purchous if she would like to use one. education on hight of cane and safety, Education on new HEP exercies( posterior pelvic tilts)    Person(s) Educated  Patient    Methods  Explanation;Demonstration;Tactile cues;Verbal cues    Comprehension  Verbalized understanding;Returned demonstration;Need further instruction       PT Short Term Goals - 03/06/17 0943      PT SHORT TERM GOAL #1   Title  Pt will be IND in HEP to improve strength, balance, endurance, and flexibililty TARGET FOR ALL STGS: 3 TREATMENT SESSIONS.    Baseline  Pt performing HEP IND at home.     Status  Achieved      PT SHORT TERM GOAL #2   Title  Trial R AFO and determine best AD for amb. and safety during mobility.     Baseline  PT and pt have trialed R AFO braces and pt demonstrates improve R toe clearance with AFO vs. no AFO. However, we are awaiting orthotist consult so goal is only partially met.     Status  Partially Met      PT SHORT TERM GOAL #3   Title  Pt will improve gait speed with LRAD and R AFO/brace to >/=2.62 ft/sec. to safely amb. in the community.     Baseline  2.60f/sec and 2.81 ft/sec. with  R AFO donned and no AD    Status  Achieved        PT Long Term Goals - 03/21/17 1148      PT LONG TERM GOAL #1   Title  Pt will be IND in progressed HEP to improve strength, balance, endurance, and flexibililty. TARGET DATE FOR ALL LTGS: 04/10/17, 12 total  visits approved, submit for more as indicated.    Baseline  No HEP    Status  New      PT LONG TERM GOAL #2   Title  Pt will amb.  600' over even and paved surfaces with LRAD and AFO, at MOD I level, in order to improve functional mobility and to care for son.     Baseline  100' with S for safety, over even terrain.    Status  New      PT LONG TERM GOAL #3   Title  Pt will improve DGI score to >/=20/24 with AFO/brace donned to decr. falls risk.     Baseline  11/24    Status  New      PT LONG TERM GOAL #4   Title  Pt will perform TUG in </=13.5 sec. with LRAD and AFO to decr. falls risk.     Baseline  15.88 sec. no AD    Status  New            Plan - 04/04/17 1412    Clinical Impression Statement  Today pt continues to work on her ability to navigate over uneven surfaces and to clear small objects. With pt reports of near falls and having continued difficulty with ambulation over uneven surfaces she will benefit from continued training over uneven surfaces. Pt demonstrated tolerance of progression of her HEP to include posterior pelvic tilts to reduce low back pain to improve her ambulation distance prior to onset of low back pain that requires her to take a sitting break.  Pt also demonstrated improved balance with curb decent with her cane set ~1 inch higher then where she currently has it and will make adjustments at home with her cane to see if improvements carry over in to daily living. pt will continue to benefit from skilled PT to address these issues above.     Clinical Impairments Affecting Rehab Potential  see above.    PT Frequency  2x / week    PT Duration  4 weeks    PT Treatment/Interventions  ADLs/Self Care Home Management;Biofeedback;Electrical Stimulation;Therapeutic activities;Therapeutic exercise;Manual techniques;Vestibular;Functional mobility training;Stair training;Gait training;Patient/family education;Orthotic Fit/Training;DME Instruction;Neuromuscular  re-education;Balance training    PT Next Visit Plan  review curb navigation with cane, check pt LTGs    Consulted and Agree with Plan of Care  Patient       Patient will benefit from skilled therapeutic intervention in order to improve the following deficits and impairments:  Abnormal gait, Decreased endurance, Impaired sensation, Decreased knowledge of use of DME, Decreased strength, Obesity, Decreased balance, Decreased mobility, Impaired flexibility, Postural dysfunction, Decreased range of motion  Visit Diagnosis: Other abnormalities of gait and mobility  Right foot drop  Muscle weakness (generalized)  Chronic right-sided low back pain with right-sided sciatica     Problem List Patient Active Problem List   Diagnosis Date Noted  . Right thigh pain 01/17/2017  . Right foot drop 12/23/2016  . Chronic right-sided low back pain with right-sided sciatica 12/23/2016  . Pedal edema 10/07/2016  . Endometrial cancer ([place]) 08/05/2016  . Superficial postoperative wound infection 07/22/2016  . 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 Diabetic neuropathy ([place]) 05/27/2016  . Anemia 05/08/2016  . DUB (dysfunctional uterine bleeding) 05/08/2016  . Type 2 diabetes mellitus ([place]) 02/16/2016  . Morbid obesity ([place]) 04/12/2015  . Depression 04/12/2015  . Asthma 03/30/2015  . Hypoxemia 03/30/2015  . Hypothyroidism 03/30/2015  . Thrombocytopenia ([place]) 03/30/2015  . Hypokalemia 03/30/2015    Waunita Schooner  SPT 04/04/2017, 2:15 PM  [facility] [address] [place], [place], [address] Phone: [phone]   Fax:  [phone]  Name: Janet [name] MRN:  [id number] Date of Birth: 05/15/1970

## 2017-04-04 NOTE — Patient Instructions (Signed)
Pelvic Tilt: Posterior - Legs Bent (Supine)    Tighten stomach and flatten back by rolling pelvis down. Hold 5 breaths. Relax. Repeat 3_ times per set.  Due 3x day in the morning and in evening before bed is a great time.  http://orth.exer.us/203   Copyright  VHI. All rights reserved.

## 2017-04-08 ENCOUNTER — Ambulatory Visit: Payer: Medicaid Other

## 2017-04-08 ENCOUNTER — Telehealth: Payer: Self-pay | Admitting: Neurology

## 2017-04-08 ENCOUNTER — Ambulatory Visit: Payer: Medicaid Other | Admitting: Family Medicine

## 2017-04-08 DIAGNOSIS — M21371 Foot drop, right foot: Secondary | ICD-10-CM

## 2017-04-08 DIAGNOSIS — M6281 Muscle weakness (generalized): Secondary | ICD-10-CM

## 2017-04-08 DIAGNOSIS — G8929 Other chronic pain: Secondary | ICD-10-CM

## 2017-04-08 DIAGNOSIS — M5441 Lumbago with sciatica, right side: Secondary | ICD-10-CM

## 2017-04-08 DIAGNOSIS — R2689 Other abnormalities of gait and mobility: Secondary | ICD-10-CM

## 2017-04-08 NOTE — Telephone Encounter (Signed)
Dr. Krista Blue has authorized a temporary handicap placard for 6 months.  Pt is aware and will pick it up from our office.  It has been placed up front for her.

## 2017-04-08 NOTE — Therapy (Signed)
[facility] [address] [place], [place], [address] Phone: [phone]   Fax:  [phone]  Physical Therapy Treatment  Patient Details  Name: Janet [name] MRN: [id number] Date of Birth: 1971/01/08 Referring Provider: Dr. Krista Blue   Encounter Date: 04/08/2017  PT End of Session - 04/08/17 1227    Visit Number  11    Number of Visits  12    Date for PT Re-Evaluation  04/10/17    Authorization Type  Medicaid: 12 visits approved through 03/14/2017--04/10/2017    PT Start Time  0845    PT Stop Time  0930    PT Time Calculation (min)  45 min    Equipment Utilized During Treatment  Gait belt    Activity Tolerance  Patient tolerated treatment well;Patient limited by fatigue    Behavior During Therapy  [facility] for tasks assessed/performed       Past Medical History:  Diagnosis Date  . Anemia   . Asthma   . Depression   . Hypothyroidism   . Pre-diabetes   . Right foot drop   . Sleep apnea   . Thyroid disease   . Uterine cancer [facility])     Past Surgical History:  Procedure Laterality Date  . CARPAL TUNNEL RELEASE    . CESAREAN SECTION    . CHOLECYSTECTOMY    . cyst removal     uterus  . DILATION AND CURETTAGE OF UTERUS     still birth  . DILATION AND CURETTAGE OF UTERUS N/A 06/11/2016   Procedure: DILATATION AND CURETTAGE;  Surgeon: Emily Filbert, MD;  Location: [facility];  Service: Gynecology;  Laterality: N/A;  . ROBOTIC ASSISTED TOTAL HYSTERECTOMY WITH BILATERAL SALPINGO OOPHERECTOMY Bilateral 07/09/2016   Procedure: XI ROBOTIC ASSISTED TOTAL HYSTERECTOMY WITH BILATERAL SALPINGO OOPHORECTOMY FOR UTERUS GREATER THAN 250 GRAMS, LYSIS OF ADHESIONS;  Surgeon: Everitt Amber, MD;  Location: [facility];  Service: Gynecology;  Laterality: Bilateral;  . SINUS EXPLORATION    . WISDOM TOOTH EXTRACTION      There were no vitals filed for this visit.  Subjective Assessment - 04/08/17 0848    Subjective  pt reports no fall since last pt  session.  pt reports mild low back pain.     Pertinent History  Asthma, hypothyroidism, diabetic neuropathy (N/T starting below knee to feet), chronic R LBP with sciatic, hx endometrial CA (MD just cleared pt) with hysterectomy, thrombocytopenia, depression, anemia, sleep apnea, left leg 1.5 inches shorter (used a built up shoe in highschool)    Currently in Pain?  Yes    Pain Score  4     Pain Location  Back    Pain Orientation  Lower    Pain Descriptors / Indicators  Aching    Pain Type  Acute pain    Pain Onset  More than a month ago    Pain Frequency  Constant    Aggravating Factors   walking standing    Pain Relieving Factors  rest    Effect of Pain on Daily Activities  walking                     [facility] Adult PT Treatment/Exercise - 04/08/17 0845      Transfers   Transfers  --      Ambulation/Gait   Ambulation/Gait  Yes    Ambulation/Gait Assistance  6: Modified independent (Device/Increase time)    Ambulation/Gait Assistance Details  Pt amb. without AFO donned, with 1  cm heel lift in L shoe 2/2 leg length discrepency incr. hip hike. No overt LOB noted    Ambulation Distance (Feet)  620 Feet standing rest break at 440 for 45mn.     Assistive device  Straight cane quad tip cane       Standardized Balance Assessment   Standardized Balance Assessment  Timed Up and Go Test      Timed Up and Go Test   TUG  Normal TUG    Normal TUG (seconds)  17.97 AFO and cane      Ankle Exercises: Seated   Other Seated Ankle Exercises  ankle 4 way with Green theraband 1x10 (plantFLX, DorsiFLX, and Eversion).          TUG  -With cane no AFO= 20.65 sec.  -Without cane and AFO 21.43 sec.  -Without cane and With AFO= 19.01 sec.  -With cane and AFO = 17.97 sec.        PT Education - 04/08/17 1223    Education provided  Yes    Education Details  HEP progression and use of theraband for ankle exercies. PT goals and use of heel wedge and AFO.     Person(s) Educated   Patient    Methods  Explanation;Demonstration;Verbal cues;Tactile cues    Comprehension  Verbalized understanding;Returned demonstration       PT Short Term Goals - 03/06/17 0943      PT SHORT TERM GOAL #1   Title  Pt will be IND in HEP to improve strength, balance, endurance, and flexibililty TARGET FOR ALL STGS: 3 TREATMENT SESSIONS.    Baseline  Pt performing HEP IND at home.     Status  Achieved      PT SHORT TERM GOAL #2   Title  Trial R AFO and determine best AD for amb. and safety during mobility.     Baseline  PT and pt have trialed R AFO braces and pt demonstrates improve R toe clearance with AFO vs. no AFO. However, we are awaiting orthotist consult so goal is only partially met.     Status  Partially Met      PT SHORT TERM GOAL #3   Title  Pt will improve gait speed with LRAD and R AFO/brace to >/=2.62 ft/sec. to safely amb. in the community.     Baseline  2.6107fsec and 2.81 ft/sec. with  R AFO donned and no AD    Status  Achieved        PT Long Term Goals - 04/09/17 1149      PT LONG TERM GOAL #1   Title  Pt will be IND in progressed HEP to improve strength, balance, endurance, and flexibililty. TARGET DATE FOR ALL LTGS: 04/10/17, 12 total visits approved, submit for more as indicated.    Baseline  IND with current HEP    Status  Achieved      PT LONG TERM GOAL #2   Title  Pt will amb. 600' over even and paved surfaces with LRAD and AFO, at MOD I level, in order to improve functional mobility and to care for son.     Baseline  Amb >600' with quad tip cane, no AFO, over even terrain    Status  Achieved      PT LONG TERM GOAL #3   Title  Pt will improve DGI score to >/=20/24 with AFO/brace donned to decr. falls risk.     Baseline  11/24    Status  New  PT LONG TERM GOAL #4   Title  Pt will perform TUG in </=13.5 sec. with LRAD and AFO to decr. falls risk.     Baseline  17.97 sec. with quad tip cane and AFO, no LOB    Status  Not Met             Plan  - 04/08/17 1335    Clinical Impression Statement  Pt continues to make progress toward her Long term goals. Pt demonstrated improved incr. stability during TUG, although time did not improve. However, pt's improved safety during transfers and turns is noted, especially with the right AFO donned (17.40 with AFO and cane) as compared to without AFO and cane( 21.43sec.).  Pt completed all tasks during therapy session today with a 1cm heel lift 2/2 R LE is longer than LLE. Pt was able to demonstrate significant improvement with ambulation distance only requiring 1 short standing break. Pt was able to met her goal of 663f ambulation with moderate Independence (increased time and cane  with quad tip). Pt demonstrated her ability to slowly progress HEP and PT session intensity while also verbalizing Independence, and compliance, with her HEP. Pt continues to be limited in her mobility due to pain and fear of falls.    Rehab Potential  Good    Clinical Impairments Affecting Rehab Potential  see above.    PT Frequency  2x / week    PT Duration  4 weeks    PT Treatment/Interventions  ADLs/Self Care Home Management;Biofeedback;Electrical Stimulation;Therapeutic activities;Therapeutic exercise;Manual techniques;Vestibular;Functional mobility training;Stair training;Gait training;Patient/family education;Orthotic Fit/Training;DME Instruction;Neuromuscular re-education;Balance training    PT Next Visit Plan  Check LTGs (DGI and Tug with AFO)    Consulted and Agree with Plan of Care  Patient     `    Patient will benefit from skilled therapeutic intervention in order to improve the following deficits and impairments:  Abnormal gait, Decreased endurance, Impaired sensation, Decreased knowledge of use of DME, Decreased strength, Obesity, Decreased balance, Decreased mobility, Impaired flexibility, Postural dysfunction, Decreased range of motion  Visit Diagnosis: Other abnormalities of gait and mobility  Right  foot drop  Muscle weakness (generalized)  Chronic right-sided low back pain with right-sided sciatica     Problem List Patient Active Problem List   Diagnosis Date Noted  . Right thigh pain 01/17/2017  . Right foot drop 12/23/2016  . Chronic right-sided low back pain with right-sided sciatica 12/23/2016  . Pedal edema 10/07/2016  . Endometrial cancer ([place] 08/05/2016  . Superficial postoperative wound infection 07/22/2016  . 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 Diabetic neuropathy ([facility] 05/27/2016  . Anemia 05/08/2016  . DUB (dysfunctional uterine bleeding) 05/08/2016  . Type 2 diabetes mellitus ([facility] 02/16/2016  . Morbid obesity ([facility] 04/12/2015  . Depression 04/12/2015  . Asthma 03/30/2015  . Hypoxemia 03/30/2015  . Hypothyroidism 03/30/2015  . Thrombocytopenia ([facility] 03/30/2015  . Hypokalemia 03/30/2015    SWaunita Schooner SPT 04/08/2017, 1:39 PM  [facility] [address]Phone: [phone]  Fax:  [phone] Name: KGeraldene EiselMRN: [id number] of Birth: 6March 01, 1972

## 2017-04-08 NOTE — Telephone Encounter (Signed)
Pt called wanting to know if Dr. Krista Blue could get her another application for a handicap sticker. [place] an appt near [place] on Thursday 2/28 and is able to pick it up then

## 2017-04-09 ENCOUNTER — Ambulatory Visit (HOSPITAL_COMMUNITY): Payer: Medicaid Other

## 2017-04-09 ENCOUNTER — Ambulatory Visit (HOSPITAL_COMMUNITY): Admission: RE | Admit: 2017-04-09 | Payer: Medicaid Other | Source: Ambulatory Visit

## 2017-04-10 ENCOUNTER — Encounter (HOSPITAL_COMMUNITY): Payer: Self-pay

## 2017-04-10 ENCOUNTER — Ambulatory Visit: Payer: Medicaid Other

## 2017-04-10 ENCOUNTER — Ambulatory Visit (HOSPITAL_COMMUNITY)
Admission: RE | Admit: 2017-04-10 | Discharge: 2017-04-10 | Disposition: A | Discharge status: Home / Self Care | Payer: Medicaid Other | Source: Ambulatory Visit | Attending: Gynecologic Oncology | Admitting: Gynecologic Oncology

## 2017-04-10 DIAGNOSIS — M21371 Foot drop, right foot: Secondary | ICD-10-CM | POA: Diagnosis not present

## 2017-04-10 DIAGNOSIS — K766 Portal hypertension: Secondary | ICD-10-CM | POA: Diagnosis not present

## 2017-04-10 DIAGNOSIS — R161 Splenomegaly, not elsewhere classified: Secondary | ICD-10-CM | POA: Insufficient documentation

## 2017-04-10 DIAGNOSIS — R1907 Generalized intra-abdominal and pelvic swelling, mass and lump: Secondary | ICD-10-CM

## 2017-04-10 DIAGNOSIS — K7460 Unspecified cirrhosis of liver: Secondary | ICD-10-CM | POA: Diagnosis not present

## 2017-04-10 DIAGNOSIS — R2689 Other abnormalities of gait and mobility: Secondary | ICD-10-CM

## 2017-04-10 DIAGNOSIS — M6281 Muscle weakness (generalized): Secondary | ICD-10-CM

## 2017-04-10 MED ORDER — IOPAMIDOL (ISOVUE-300) INJECTION 61%
INTRAVENOUS | Status: AC
  Administered 2017-04-10: 100 mL via INTRAVENOUS
  Filled 2017-04-10: qty 100

## 2017-04-10 MED ORDER — IOPAMIDOL (ISOVUE-300) INJECTION 61%
100.0000 mL | Freq: Once | INTRAVENOUS | Status: AC | PRN
  Administered 2017-04-10: 100 mL via INTRAVENOUS

## 2017-04-10 NOTE — Therapy (Signed)
[facility] [address] [place], [place], [address] Phone: [phone]   Fax:  [phone]  Physical Therapy Treatment  Patient Details  Name: Janet [name] MRN: [id number] Date of Birth: [birth year] Referring Provider: Dr. Krista Blue   Encounter Date: 04/10/2017  PT End of Session - 04/10/17 1233    Visit Number  12    Number of Visits  12    Date for PT Re-Evaluation  04/10/17    Authorization Type  Medicaid: 12 visits approved through 03/14/2017--04/10/2017    PT Start Time  6389    PT Stop Time  1100    PT Time Calculation (min)  45 min    Equipment Utilized During Treatment  Gait belt    Activity Tolerance  Patient tolerated treatment well    Behavior During Therapy  [facility] for tasks assessed/performed       Past Medical History:  Diagnosis Date  . Anemia   . Asthma   . Depression   . Hypothyroidism   . Pre-diabetes   . Right foot drop   . Sleep apnea   . Thyroid disease   . Uterine cancer [facility])     Past Surgical History:  Procedure Laterality Date  . CARPAL TUNNEL RELEASE    . CESAREAN SECTION    . CHOLECYSTECTOMY    . cyst removal     uterus  . DILATION AND CURETTAGE OF UTERUS     still birth  . DILATION AND CURETTAGE OF UTERUS N/A 06/11/2016   Procedure: DILATATION AND CURETTAGE;  Surgeon: Emily Filbert, MD;  Location: [facility];  Service: Gynecology;  Laterality: N/A;  . ROBOTIC ASSISTED TOTAL HYSTERECTOMY WITH BILATERAL SALPINGO OOPHERECTOMY Bilateral 07/09/2016   Procedure: XI ROBOTIC ASSISTED TOTAL HYSTERECTOMY WITH BILATERAL SALPINGO OOPHORECTOMY FOR UTERUS GREATER THAN 250 GRAMS, LYSIS OF ADHESIONS;  Surgeon: Everitt Amber, MD;  Location: [facility];  Service: Gynecology;  Laterality: Bilateral;  . SINUS EXPLORATION    . WISDOM TOOTH EXTRACTION      There were no vitals filed for this visit.   Subjective Assessment - 04/10/17 1019    Subjective  pt reports no falls since last PT session. pt reports she  had her CT scan this morning and her back is bothing her a little bit from lying down so long.     Pertinent History  Asthma, hypothyroidism, diabetic neuropathy (N/T starting below knee to feet), chronic R LBP with sciatic, hx endometrial CA (MD just cleared pt) with hysterectomy, thrombocytopenia, depression, anemia, sleep apnea, left leg 1.5 inches shorter (used a built up shoe in highschool)    Patient Stated Goals  To walk longer distances and without pain (and normal)    Currently in Pain?  No/denies          [facility] PT Assessment - 04/10/17 1015      Dynamic Gait Index   Level Surface  Mild Impairment    Change in Gait Speed  Mild Impairment    Gait with Horizontal Head Turns  Mild Impairment    Gait with Vertical Head Turns  Moderate Impairment    Gait and Pivot Turn  Mild Impairment    Step Over Obstacle  Moderate Impairment    Step Around Obstacles  Normal    Steps  Mild Impairment    Total Score  15    DGI comment:  with quad tip cane                [facility]  Adult PT Treatment/Exercise - 04/10/17 1015      Transfers   Transfers  Sit to Stand;Stand to Sit    Sit to Stand  6: Modified independent (Device/Increase time);With upper extremity assist;From elevated surface;With armrests;From chair/3-in-1;From bed    Stand to Sit  6: Modified independent (Device/Increase time);With upper extremity assist;With armrests;To bed;To chair/3-in-1      Ambulation/Gait   Ambulation/Gait  Yes    Ambulation/Gait Assistance  6: Modified independent (Device/Increase time)    Ambulation/Gait Assistance Details  Pt amb. with R ant. toe off AFO donned and SPC with quad tip. MOD I level over even terrain and S over uneven terrain. Cues to improve weight shifting during turns and for stair technique.    Ambulation Distance (Feet)  500 Feet over even/uneven terrain, 75'x2    Assistive device  Other (Comment) cane with quad tip    Gait Pattern  Step-through pattern;Decreased stride  length;Decreased weight shift to right;Wide base of support;Decreased trunk rotation;Right foot flat;Left hip hike    Ambulation Surface  Indoor;Outdoor;Unlevel;Level;Paved    Gait velocity  2.20f/sec with quad tip cane    Stairs  Yes    Stairs Assistance  6: Modified independent (Device/Increase time)    Stair Management Technique  One rail Left;Two rails;Alternating pattern;Step to pattern pt required 2 rails goinw down stairs with alternate pattern    Number of Stairs  4    Height of Stairs  6    Ramp  6: Modified independent (Device)      Timed Up and Go Test   TUG  Normal TUG    Normal TUG (seconds)  13.7 with R AFO and cane with quad tip                PT Education - 04/10/17 1231    Education Details  home exercises programe, Physical therapy goals, Physical therapy outcomes and what she can expect regarding improvements in strength, balance and gait.    Person(s) Educated  Patient    Methods  Explanation;Demonstration    Comprehension  Verbalized understanding;Returned demonstration        PT Short Term Goals - 03/06/17 0943      PT SHORT TERM GOAL #1   Title  Pt will be IND in HEP to improve strength, balance, endurance, and flexibililty TARGET FOR ALL STGS: 3 TREATMENT SESSIONS.    Baseline  Pt performing HEP IND at home.     Status  Achieved      PT SHORT TERM GOAL #2   Title  Trial R AFO and determine best AD for amb. and safety during mobility.     Baseline  PT and pt have trialed R AFO braces and pt demonstrates improve R toe clearance with AFO vs. no AFO. However, we are awaiting orthotist consult so goal is only partially met.     Status  Partially Met      PT SHORT TERM GOAL #3   Title  Pt will improve gait speed with LRAD and R AFO/brace to >/=2.62 ft/sec. to safely amb. in the community.     Baseline  2.646fsec and 2.81 ft/sec. with  R AFO donned and no AD    Status  Achieved        PT Long Term Goals - 04/10/17 1246      PT LONG TERM GOAL  #1   Title  Pt will be IND in progressed HEP to improve strength, balance, endurance, and flexibililty. TARGET DATE FOR ALL  LTGS: 04/10/17, 12 total visits approved, submit for more as indicated. (all unmet goals will be carried over to new POC-8 visits)    Baseline  IND with current HEP    Status  Achieved    Target Date  04/10/17      PT LONG TERM GOAL #2   Title  Pt will amb. 600' over even and paved surfaces with LRAD and AFO, at MOD I level, in order to improve functional mobility and to care for son.     Baseline  Amb >600' with quad tip cane, no AFO, over even terrain    Status  Achieved    Target Date  04/10/17      PT LONG TERM GOAL #3   Title  Pt will improve DGI score to >/=20/24 with AFO/brace donned to decr. falls risk.     Baseline  11/24 at inital evaluation. 15/24 today     Status  Partially Met    Target Date  04/10/17      PT LONG TERM GOAL #4   Title  Pt will perform TUG in </=13.5 sec. with LRAD and AFO to decr. falls risk.     Baseline  15.88 secounds at inital evaluation and 13.70 today(04/10/2017)    Status  Partially Met    Target Date  04/10/17        Plan - 04/10/17 1244    Clinical Impression Statement  Today patient continues to demonstrate significant improvement in her level of functional mobility and progress towards long term goals. With the use of clinic supplied AFO and quad tip cane patients performed the time- up-go test in13.7 seconds, demonstrating an improvement from her initial score of 15.88 seconds. Patient also complete the Dynamic gait index with a 4 point improvement ( 11 at evaluation and 15/24 today). These findings in addition to her increased tolerance to ambulation 600+ ft and stair training displays her reduction in falls risk since initial evaluation. Patient will continues to be highly motivated during PT session and will continue to benefit from skilled PT intervention in order to further reduce her falls risk, improve her dynamic/gait  balance and confidence during activities of daily living.  PT requesting additional 2x/week for 4 weeks to improve safety during functional mobility and to trial AFO once pt gets approval and AFO delivered to pt.     Rehab Potential  Good    Clinical Impairments Affecting Rehab Potential  see above.    PT Frequency  2x / week    PT Duration  4 weeks    PT Treatment/Interventions  ADLs/Self Care Home Management;Biofeedback;Electrical Stimulation;Therapeutic activities;Therapeutic exercise;Manual techniques;Vestibular;Functional mobility training;Stair training;Gait training;Patient/family education;Orthotic Fit/Training;DME Instruction;Neuromuscular re-education;Balance training    PT Next Visit Plan  Continue to progress gait skills( stepping over objects, stairs, curbs and ramps) as well as continue to improve ambulation distance.     Consulted and Agree with Plan of Care  Patient            Patient will benefit from skilled therapeutic intervention in order to improve the following deficits and impairments:  Abnormal gait, Decreased endurance, Impaired sensation, Decreased knowledge of use of DME, Decreased strength, Obesity, Decreased balance, Decreased mobility, Impaired flexibility, Postural dysfunction, Decreased range of motion  Visit Diagnosis: No diagnosis found.     Problem List Patient Active Problem List   Diagnosis Date Noted  . Right thigh pain 01/17/2017  . Right foot drop 12/23/2016  . Chronic right-sided low back pain with right-sided  sciatica 12/23/2016  . Pedal edema 10/07/2016  . Endometrial cancer ([place]) 08/05/2016  . Superficial postoperative wound infection 07/22/2016  . 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 Diabetic neuropathy ([place]) 05/27/2016  . Anemia 05/08/2016  . DUB (dysfunctional uterine bleeding) 05/08/2016  . Type 2 diabetes mellitus ([place]) 02/16/2016  . Morbid obesity ([place]) 04/12/2015  . Depression 04/12/2015  . Asthma 03/30/2015   . Hypoxemia 03/30/2015  . Hypothyroidism 03/30/2015  . Thrombocytopenia ([place]) 03/30/2015  . Hypokalemia 03/30/2015    Waunita Schooner SPT 04/10/2017, 12:50 PM  [facility] [address] [place], [place], [address] Phone: [phone]   Fax:  [phone]  Name: Vermelle Cammarata MRN: [id number] Date of Birth: [birth year]

## 2017-04-11 ENCOUNTER — Ambulatory Visit: Payer: Medicaid Other

## 2017-04-14 ENCOUNTER — Telehealth: Payer: Self-pay

## 2017-04-14 NOTE — Telephone Encounter (Signed)
Told ms Koppel that no follow up imaging is necessary per Dr. Denman George.  The aortocaval lymph node is stable on CT 04-10-17 when compared to PET-CT 12-04-16.

## 2017-04-15 ENCOUNTER — Encounter: Payer: Self-pay | Admitting: Gynecologic Oncology

## 2017-04-15 ENCOUNTER — Telehealth: Payer: Self-pay | Admitting: *Deleted

## 2017-04-15 ENCOUNTER — Ambulatory Visit: Payer: Medicaid Other

## 2017-04-15 NOTE — Telephone Encounter (Signed)
Patient called and left a message stating " I'm having abdomen pain, please call me back."  Returned the patient's call regarding the abdomen pain. Patient was asked when did the pain start, she stated "Sunday evening." Patient was asked anything make the pain worse, she stated "Worse when I urinate or have a bowel movement, the pain is so bad that I double over with the pain." Patient was asked what her pain scale level was, She stated "just sitting the pain is at a 6, but with urination and bowel movement it is a 9." Patient was asked where the pain was, she stated "middle of the abdomen."  Patient was asked and denied having any odor, bleeding or burning with urination or bowel movement. Patient stated "I'm having no bleeding at all."   Discussed with [name] APP and she recommends that "it could be pulled scar tissue, and that will heal over time. To watch it and if it gets worse and still there call back on Friday or Monday. But to go to the ER if it gets worse or she starts to bleed. She can take some pain medicine if she has some left, but not to drive.  Advised the patient of the above and she verbalized understanding.

## 2017-04-16 ENCOUNTER — Telehealth: Payer: Self-pay | Admitting: *Deleted

## 2017-04-16 ENCOUNTER — Ambulatory Visit (HOSPITAL_COMMUNITY): Payer: Medicaid Other

## 2017-04-16 NOTE — Telephone Encounter (Signed)
Patient called and left a message stated that her abdomen pain has gotten worse. Per [name] APP and Dr. Denman George " We have reviewed the CT scan and there is nothing to explain the pain. The scan is clear. Try to go to your PCP. She can try a support top."  Called and gave the patient the message and she verbalized understanding.

## 2017-04-17 ENCOUNTER — Encounter: Payer: Self-pay | Admitting: Neurology

## 2017-04-17 ENCOUNTER — Ambulatory Visit: Payer: Medicaid Other | Admitting: Neurology

## 2017-04-17 VITALS — BP 145/77 | HR 91 | Ht 69.0 in | Wt 379.0 lb

## 2017-04-17 DIAGNOSIS — G5701 Lesion of sciatic nerve, right lower limb: Secondary | ICD-10-CM

## 2017-04-17 MED ORDER — MELOXICAM 15 MG PO TABS: 15.0000 mg | ORAL_TABLET | Freq: Every day | ORAL | 6 refills | Status: DC

## 2017-04-17 MED ORDER — OXCARBAZEPINE 150 MG PO TABS: 150.0000 mg | ORAL_TABLET | Freq: Two times a day (BID) | ORAL | 4 refills | Status: DC

## 2017-04-17 NOTE — Progress Notes (Signed)
PATIENT: Janet [name] DOB: April 10, 1970  Chief Complaint  Patient presents with  . Follow-up    PCP: Dr Margarita Rana, pt is doing well, has noticed improvement in her gait while working with PT     HISTORICAL  Janet [name], is a 47 year old female, seen in refer by her primary care doctor  Arnoldo Morale, for evaluation of right foot drop, initial evaluation was on November twelfth 2018.  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  She noticed that the top of right toe went numb since April 2018, she described right toe numbness tingling, radiating paresthesia to the top of right foot, right lateral leg, also complains of low back pain, radiating pain to her right leg, getting worse after standing up since July 2018, she denies bowel and bladder incontinence, no left leg involvement,  I personally reviewed x-ray in October 2018, degenerative changes of lumbar spine with scoliosis concave to the left, no acute abnormality,  Laboratory evaluation in October 2018, glucose was 105, creatinine 0.7, lipid profile, cholesterol 131, LDL 50, normal CMP with creatinine of 0.69, decreased TSH 0.17, A1c 6.2, uric acid 4.7, CPK 78  Update April 17, 2017: MRI of lumbar in December 2018 showed  mild degenerative changes, no significant stenosis, or nerve root compression  MRI of right thigh showed no significant abnormality, no evidence of nerve damage.  PET scan October 2018 no evidence of hypermetabolic recurrent, metastatic disease, retroperitoneal adenopathy persistent, remains suspicious based on interval progression since May 2018  CT abdomen in February 2019, similar appearing aortocaval lymph nodes when compared to recent scan, metastatic disease to this location is not excluded, recommended follow-up scan, Hepatic cirrhosis, portal vein hypertension with splenomegaly,upper abdominal collateral vessels.  EMG nerve conduction study noticed significant active denervation at right tibialis anterior, peroneal longus, mild involvement of right biceps femoris short head.  With well preserved bilateral sural, and superficial peroneal sensory potentials, which raised the possibility of right sciatic neuropathy versus right lumbosacral radiculopathy.  She reported 25% improved, right foot is getting stronger, she still has low back pain, peritoneal pain.  REVIEW OF SYSTEMS: Full 14 system review of systems performed and notable only for numbness, weakness, joint pain, achy muscles, constipations.  ALLERGIES: Allergies  Allergen Reactions  . Ciprofloxacin Shortness Of Breath  . Shrimp [Shellfish Allergy]     HOME MEDICATIONS: Current Outpatient Medications  Medication Sig Dispense Refill  . albuterol (PROVENTIL HFA;VENTOLIN HFA) 108 (90 Base) MCG/ACT inhaler Inhale 1-2 puffs into the lungs every 6 (six) hours as needed for wheezing or shortness of breath. 54 g 3  . albuterol (PROVENTIL) (2.5 MG/3ML) 0.083% nebulizer solution Take 3 mLs (2.5 mg total) by nebulization every 6 (six) hours as needed for wheezing or shortness of breath. 75 mL 3  . cetirizine (ZYRTEC) 10 MG tablet Take 10 mg by mouth daily.    . DULoxetine (CYMBALTA) 60  MG capsule Take 1 capsule (60 mg total) by mouth  daily. 30 capsule 3  . Fluticasone-Salmeterol (ADVAIR DISKUS) 250-50 MCG/DOSE AEPB Inhale 1 puff into the lungs 2 (two) times daily. 60 each 3  . ibuprofen (ADVIL,MOTRIN) 600 MG tablet Take 1 tablet (600 mg total) by mouth every 6 (six) hours as needed. 30 tablet 1  . levothyroxine (SYNTHROID, LEVOTHROID) 150 MCG tablet Take 1 tablet (150 mcg total) by mouth daily before breakfast. 30 tablet 3  . Lidocaine 4 % GEL Apply to right toe as needed. 10 g 11  . meloxicam (MOBIC) 15 MG tablet Take 1 tablet (15 mg total) by mouth daily. 30 tablet 2  . montelukast (SINGULAIR) 10 MG tablet Take 1 tablet (10 mg total) by mouth at bedtime. 90 tablet 1  . OXcarbazepine (TRILEPTAL) 150 MG tablet Take 1 tablet (150 mg total) by mouth 2 (two) times daily. 60 tablet 2   No current facility-administered medications for this visit.     PAST MEDICAL HISTORY: Past Medical History:  Diagnosis Date  . Anemia   . Asthma   . Depression   . Hypothyroidism   . Pre-diabetes   . Right foot drop   . Sleep apnea   . Thyroid disease   . Uterine cancer ([place])     PAST SURGICAL HISTORY: Past Surgical History:  Procedure Laterality Date  . CARPAL TUNNEL RELEASE    . CESAREAN SECTION    . CHOLECYSTECTOMY    . cyst removal     uterus  . DILATION AND CURETTAGE OF UTERUS     still birth  . DILATION AND CURETTAGE OF UTERUS N/A 06/11/2016   Procedure: DILATATION AND CURETTAGE;  Surgeon: Emily Filbert, MD;  Location: [facility];  Service: Gynecology;  Laterality: N/A;  . ROBOTIC ASSISTED TOTAL HYSTERECTOMY WITH BILATERAL SALPINGO OOPHERECTOMY Bilateral 07/09/2016   Procedure: XI ROBOTIC ASSISTED TOTAL HYSTERECTOMY WITH BILATERAL SALPINGO OOPHORECTOMY FOR UTERUS GREATER THAN 250 GRAMS, LYSIS OF ADHESIONS;  Surgeon: Everitt Amber, MD;  Location: [facility];  Service: Gynecology;  Laterality: Bilateral;  . SINUS EXPLORATION    . WISDOM TOOTH EXTRACTION      FAMILY HISTORY: Family History  Problem Relation Age of Onset  . Heart  attack Mother   . Suicidality Father     SOCIAL HISTORY:  Social History   Socioeconomic History  . Marital status: Widowed    Spouse name: Not on file  . Number of children: 1  . Years of education: college  . Highest education level: Not on file  Social Needs  . Financial resource strain: Not on file  . Food insecurity - worry: Not on file  . Food insecurity - inability: Not on file  . Transportation needs - medical: Not on file  . Transportation needs - non-medical: Not on file  Occupational History  . Occupation: Unemployed  Tobacco Use  . Smoking status: Never Smoker  . Smokeless tobacco: Never Used  Substance and Sexual Activity  . Alcohol use: No  . Drug use: No  . Sexual activity: Not Currently    Birth control/protection: None  Other Topics Concern  . Not on file  Social History Narrative   Lives at home with her son.   Right-handed.   2 cups caffeine per day.     PHYSICAL EXAM   Vitals:   04/17/17 0912  BP: (!) 145/77  Pulse: 91  Weight: (!) 379 lb (171.9 kg)  Height: 5\' 9"  (1.753 m)  Not recorded      Body mass index is 55.97 kg/m.  PHYSICAL EXAMNIATION:  Gen: NAD, conversant, well nourised, obese, well groomed                     Cardiovascular: Regular rate rhythm, no peripheral edema, warm, nontender. Eyes: Conjunctivae clear without exudates or hemorrhage Neck: Supple, no carotid bruits. Pulmonary: Clear to auscultation bilaterally   NEUROLOGICAL EXAM:  MENTAL STATUS: Speech:    Speech is normal; fluent and spontaneous with normal comprehension.  Cognition:     Orientation to time, place and person     Normal recent and remote memory     Normal Attention span and concentration     Normal Language, naming, repeating,spontaneous speech     Fund of knowledge   CRANIAL NERVES: CN II: Visual fields are full to confrontation. Fundoscopic exam is normal with sharp discs and no vascular changes. Pupils are round equal and briskly  reactive to light. CN III, IV, VI: extraocular movement are normal. No ptosis. CN V: Facial sensation is intact to pinprick in all 3 divisions bilaterally. Corneal responses are intact.  CN VII: Face is symmetric with normal eye closure and smile. CN VIII: Hearing is normal to rubbing fingers CN IX, X: Palate elevates symmetrically. Phonation is normal. CN XI: Head turning and shoulder shrug are intact CN XII: Tongue is midline with normal movements and no atrophy.  MOTOR: She has mild right ankle dorsi flexion, eversion weakness, moderate right toe extension weakness.  REFLEXES: Reflexes are 2 and symmetric at the biceps, triceps, knees, and mildly decreased right ankle reflex, but still present. Plantar responses are flexor.  SENSORY:  She has decreased light touch pinprick at right first web space, extending to top of right foot, right lateral leg.   COORDINATION: Rapid alternating movements and fine finger movements are intact. There is no dysmetria on finger-to-nose and heel-knee-shin.    GAIT/STANCE: Antalgic, she has mild gait abnormality, dragging her right leg, has difficulty with right ankle dorsi flexion Romberg is absent.   DIAGNOSTIC DATA (LABS, IMAGING, TESTING) - I reviewed patient records, labs, notes, testing and imaging myself where available.   ASSESSMENT AND PLAN  Janet [name] is a 47 y.o. female   Right foot drop, sensory loss at right superficial peroneal and deep peroneal distribution  Right common peroneal neuropathy,   but I was not able to exactly localize the lesion,  MRI of the lumbar showed no significant abnormality,   MRI of the right thigh showed no significant abnormality.  CT abdomen and pelvic region showed mildly enlarged retroperitoneal lymph node in the aortocaval space, she did have a history of uterine cancer, only required surgery, no chemoradiation therapy,  Continue Trileptal 150 mg twice a day, Cymbalta 60 mg a day, Mobic as  needed     Janet [name], M.D. Ph.D.  [facility] [address] [place], [place] [address] Ph: [phone] Fax: [phone]  CC: Arnoldo Morale, MD

## 2017-04-18 ENCOUNTER — Ambulatory Visit: Payer: Medicaid Other

## 2017-04-25 ENCOUNTER — Ambulatory Visit: Payer: Medicaid Other

## 2017-04-28 ENCOUNTER — Encounter: Payer: Self-pay | Admitting: Physical Therapy

## 2017-04-28 ENCOUNTER — Ambulatory Visit: Payer: Medicaid Other | Attending: Family Medicine | Admitting: Physical Therapy

## 2017-04-28 ENCOUNTER — Telehealth: Payer: Self-pay | Admitting: Neurology

## 2017-04-28 DIAGNOSIS — R269 Unspecified abnormalities of gait and mobility: Secondary | ICD-10-CM

## 2017-04-28 DIAGNOSIS — M21371 Foot drop, right foot: Secondary | ICD-10-CM | POA: Diagnosis present

## 2017-04-28 DIAGNOSIS — M6281 Muscle weakness (generalized): Secondary | ICD-10-CM | POA: Insufficient documentation

## 2017-04-28 DIAGNOSIS — R2689 Other abnormalities of gait and mobility: Secondary | ICD-10-CM | POA: Diagnosis present

## 2017-04-28 NOTE — Therapy (Signed)
[facility] [address] [place], [place], [address] Phone: [phone]   Fax:  [phone]  Physical Therapy Treatment  Patient Details  Name: Janet [name] MRN: [id number] Date of Birth: [birth year] Referring Provider: Dr. Krista Blue   Encounter Date: 04/28/2017  PT End of Session - 04/28/17 0930    Visit Number  13    Number of Visits  20 medicaid approved addt'l 8 visits    Date for PT Re-Evaluation  05/22/17    Authorization Type  Medicaid: 12 visits approved through 03/14/2017--04/10/2017; additional 8 visits approved 04/25/17-05/22/17    Authorization - Visit Number  13    Authorization - Number of Visits  20    PT Start Time  0845    PT Stop Time  0927    PT Time Calculation (min)  42 min    Equipment Utilized During Treatment  --    Activity Tolerance  Patient tolerated treatment well    Behavior During Therapy  [facility] for tasks assessed/performed       Past Medical History:  Diagnosis Date  . Anemia   . Asthma   . Depression   . Hypothyroidism   . Pre-diabetes   . Right foot drop   . Sleep apnea   . Thyroid disease   . Uterine cancer [facility])     Past Surgical History:  Procedure Laterality Date  . CARPAL TUNNEL RELEASE    . CESAREAN SECTION    . CHOLECYSTECTOMY    . cyst removal     uterus  . DILATION AND CURETTAGE OF UTERUS     still birth  . DILATION AND CURETTAGE OF UTERUS N/A 06/11/2016   Procedure: DILATATION AND CURETTAGE;  Surgeon: Emily Filbert, MD;  Location: [facility];  Service: Gynecology;  Laterality: N/A;  . ROBOTIC ASSISTED TOTAL HYSTERECTOMY WITH BILATERAL SALPINGO OOPHERECTOMY Bilateral 07/09/2016   Procedure: XI ROBOTIC ASSISTED TOTAL HYSTERECTOMY WITH BILATERAL SALPINGO OOPHORECTOMY FOR UTERUS GREATER THAN 250 GRAMS, LYSIS OF ADHESIONS;  Surgeon: Everitt Amber, MD;  Location: [facility];  Service: Gynecology;  Laterality: Bilateral;  . SINUS EXPLORATION    . WISDOM TOOTH EXTRACTION      There were  no vitals filed for this visit.  Subjective Assessment - 04/28/17 0845    Subjective  Wants to know who to contact re: her AFO as she has not received it yet or heard anything. Asking about any equipment recommended for her to be able to go on a cruise with her sister. (She wll be driving to [place] and then boarding ship--no flying).     Pertinent History  Asthma, hypothyroidism, diabetic neuropathy (N/T starting below knee to feet), chronic R LBP with sciatic, hx endometrial CA (MD just cleared pt) with hysterectomy, thrombocytopenia, depression, anemia, sleep apnea, left leg 1.5 inches shorter (used a built up shoe in highschool)    Patient Stated Goals  To walk longer distances and without pain (and normal)    Currently in Pain?  Yes    Pain Score  6     Pain Location  Back    Pain Orientation  Lower    Pain Descriptors / Indicators  Aching    Pain Type  Acute pain    Pain Onset  More than a month ago        Self care- Education re: continuing to work towards unmet goals. Educated patient on process for obtaining a bariatric rollator and she plans to pursue with either her neurologist (  she had a recent appointment or with her PCP. Educated re: process for obtaining an AFO and provided contact information for [employer] (after calling and not successful in speaking with the orthotist to get an update).   Gait training- with rollator--education in proper use; ambulated 250 ft with vc for technique (upright posture, less pressure thru bil UEs) and technique for tight turns in tight spaces. Educated in proper/safe way to turn to sit on seat (unable to practice as clinic rollator not wide enough for her to sit upon).   There-ex- Reviewed patient's current HEP to determine if updates needed. Patient reports she currently is doing HEP several times per week.                         PT Education - 04/28/17 1246    Education Details  Contact information for [facility] to follow-up on AFO; Information on local DME providers for pt to inquire re: bariatric rollator; process normally followed to get equipment covered by [employer] (face to face, MD progress note, MD order provide to equipment provider)    Person(s) Educated  Patient    Methods  Explanation;Handout    Comprehension  Verbalized understanding          PT Long Term Goals - 04/28/17 1754      PT LONG TERM GOAL #1   Title  see prior progress notes for prior goals      PT LONG TERM GOAL #2   Status  --      PT LONG TERM GOAL #3   Title  Pt will improve DGI score to >/=20/24 with AFO/brace donned to decr. falls risk.     Baseline  11/24 at inital evaluation. 15/24 today     Status  On-going      PT LONG TERM GOAL #4   Title  Pt will perform TUG in </=13.5 sec. with LRAD and AFO to decr. falls risk.     Baseline  15.88 secounds at inital evaluation and 13.70 today(04/10/2017)    Status  On-going            Plan - 04/28/17 1310    Clinical Impression Statement  Patient returns today after approval for an additional 8 visits by Medicaid. Patient inquiring re: status of her AFO (found that her consult with [facility] was 4 weeks ago). At her request, placed a call to [facility] and ultimately had to leave a message with the receptionist and provided patient with the oontact information for [facility] (she had been unable to remember the name of clinic to call prior to this). Additionally, patient inquiring re: appropriateness of bariatric rollator to take on vacation with her sister where there will be longer walks and potentially standing in lines. Educated patient on pro's and con's of a rollator and educated her in the proper use. Discussed her goals moving forward and anticipate she will continue to benefit from PT.     Rehab Potential  Good    Clinical Impairments Affecting Rehab Potential  see above.    PT Frequency  2x / week    PT Duration  4 weeks    PT Treatment/Interventions   ADLs/Self Care Home Management;Biofeedback;Electrical Stimulation;Therapeutic activities;Therapeutic exercise;Manual techniques;Vestibular;Functional mobility training;Stair training;Gait training;Patient/family education;Orthotic Fit/Training;DME Instruction;Neuromuscular re-education;Balance training    PT Next Visit Plan  Continue to progress gait skills( stepping over objects, stairs, curbs and ramps) as well as continue to improve ambulation distance. Use  clinic rt anterior toe-off AFO until her's arrives.     Consulted and Agree with Plan of Care  Patient       Patient will benefit from skilled therapeutic intervention in order to improve the following deficits and impairments:  Abnormal gait, Decreased endurance, Impaired sensation, Decreased knowledge of use of DME, Decreased strength, Obesity, Decreased balance, Decreased mobility, Impaired flexibility, Postural dysfunction, Decreased range of motion  Visit Diagnosis: Other abnormalities of gait and mobility  Right foot drop     Problem List Patient Active Problem List   Diagnosis Date Noted  . 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 Right thigh pain 01/17/2017  . Right foot drop 12/23/2016  . Chronic right-sided low back pain with right-sided sciatica 12/23/2016  . Pedal edema 10/07/2016  . Endometrial cancer ([place]) 08/05/2016  . Superficial postoperative wound infection 07/22/2016  . 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 Diabetic neuropathy ([place]) 05/27/2016  . Anemia 05/08/2016  . DUB (dysfunctional uterine bleeding) 05/08/2016  . Type 2 diabetes mellitus ([place]) 02/16/2016  . Morbid obesity ([place]) 04/12/2015  . Depression 04/12/2015  . Asthma 03/30/2015  . Hypoxemia 03/30/2015  . Hypothyroidism 03/30/2015  . Thrombocytopenia ([place]) 03/30/2015  . Hypokalemia 03/30/2015    Rexanne Mano, PT 04/28/2017, 5:58 PM  [facility] [address] [place], [place], [address] Phone: [phone]   Fax:  [phone]  Name: Venisa Frampton MRN: [id number] Date of Birth: 10-26-1970

## 2017-04-28 NOTE — Telephone Encounter (Signed)
Pt requesting a bariatric walker with the seat, pt and the physical therapist both agreed stating it would be best for the pt

## 2017-04-28 NOTE — Patient Instructions (Addendum)
Medical supply stores: [facility] on [address] [phone]  [facility] on [address] or in [place] [phone] for both locations  [facility] on [address] [phone] 

## 2017-04-28 NOTE — Telephone Encounter (Addendum)
Ok per vo by Dr. Krista Blue to provide rx.  Prescription printed, signed and placed up front for pick up.  Attempted to call patient but had to leave message.  I let her know the prescription was ready for pick up.  I also let her know we are happy to fax to a particular company, if she want to call us back.

## 2017-04-29 ENCOUNTER — Ambulatory Visit: Payer: Medicaid Other | Admitting: Physical Therapy

## 2017-04-29 ENCOUNTER — Encounter: Payer: Self-pay | Admitting: Physical Therapy

## 2017-04-29 DIAGNOSIS — M21371 Foot drop, right foot: Secondary | ICD-10-CM

## 2017-04-29 DIAGNOSIS — R2689 Other abnormalities of gait and mobility: Secondary | ICD-10-CM

## 2017-04-29 DIAGNOSIS — M6281 Muscle weakness (generalized): Secondary | ICD-10-CM

## 2017-04-29 NOTE — Telephone Encounter (Signed)
Signed order faxed and confirmed to [facility].

## 2017-04-29 NOTE — Therapy (Signed)
[facility] [address] [place], [place], [address] Phone: [phone]   Fax:  [phone]  Physical Therapy Treatment  Patient Details  Name: Janet [name] MRN: [id number] Date of Birth: [birth year] Referring Provider: Dr. Krista Blue   Encounter Date: 04/29/2017  PT End of Session - 04/29/17 2017    Visit Number  14    Number of Visits  20 medicaid approved addt'l 8 visits    Date for PT Re-Evaluation  05/22/17    Authorization Type  Medicaid: 12 visits approved through 03/14/2017--04/10/2017; additional 8 visits approved 04/25/17-05/22/17    Authorization - Visit Number  14    Authorization - Number of Visits  20    PT Start Time  1401    PT Stop Time  1445    PT Time Calculation (min)  44 min    Activity Tolerance  Patient tolerated treatment well    Behavior During Therapy  [facility] for tasks assessed/performed       Past Medical History:  Diagnosis Date  . Anemia   . Asthma   . Depression   . Hypothyroidism   . Pre-diabetes   . Right foot drop   . Sleep apnea   . Thyroid disease   . Uterine cancer [facility])     Past Surgical History:  Procedure Laterality Date  . CARPAL TUNNEL RELEASE    . CESAREAN SECTION    . CHOLECYSTECTOMY    . cyst removal     uterus  . DILATION AND CURETTAGE OF UTERUS     still birth  . DILATION AND CURETTAGE OF UTERUS N/A 06/11/2016   Procedure: DILATATION AND CURETTAGE;  Surgeon: Emily Filbert, MD;  Location: [facility];  Service: Gynecology;  Laterality: N/A;  . ROBOTIC ASSISTED TOTAL HYSTERECTOMY WITH BILATERAL SALPINGO OOPHERECTOMY Bilateral 07/09/2016   Procedure: XI ROBOTIC ASSISTED TOTAL HYSTERECTOMY WITH BILATERAL SALPINGO OOPHORECTOMY FOR UTERUS GREATER THAN 250 GRAMS, LYSIS OF ADHESIONS;  Surgeon: Everitt Amber, MD;  Location: [facility];  Service: Gynecology;  Laterality: Bilateral;  . SINUS EXPLORATION    . WISDOM TOOTH EXTRACTION      There were no vitals filed for this  visit.  Subjective Assessment - 04/29/17 2014    Subjective  "Yesterday was very productive!" Reports she talked to [facility] and her AFO is in and she will go for fitting in one week (first available appt). Dr. Krista Blue did order bariatric rollator for her and submitted it to [employer] (local company that will file with Medicaid insurance).     Pertinent History  Asthma, hypothyroidism, diabetic neuropathy (N/T starting below knee to feet), chronic R LBP with sciatic, hx endometrial CA (MD just cleared pt) with hysterectomy, thrombocytopenia, depression, anemia, sleep apnea, left leg 1.5 inches shorter (used a built up shoe in highschool)    Patient Stated Goals  To walk longer distances and without pain (and normal)    Currently in Pain?  Yes    Pain Score  2     Pain Location  Back    Pain Orientation  Lower    Pain Descriptors / Indicators  Aching    Pain Type  Acute pain    Pain Onset  More than a month ago    Pain Frequency  Constant       Treatment-  Gait training-  With SPC and clinic's rt anterior walk-on AFO for dorsiflexion assist. Up to 115 ft with minguard assist (especially around and over obstacles).   There-ex-  Side stepping over 6-8" hurdles (wearing rt AFO) with single UE support on countertop x 4 lengths; forward stepping over hurdles x 2 lengths.  Seated knee flexion (hamstring curls) with green band 2 sets x 10 reps with instructional cues. Standing rt knee terminal stance with green band for improved knee control. 20 reps x 2 sets.                        PT Education - 04/29/17 2017    Education Details  see additions to HEP    Person(s) Educated  Patient    Methods  Explanation;Demonstration;Handout    Comprehension  Verbalized understanding;Returned demonstration       PT Short Term Goals - 03/06/17 0943      PT SHORT TERM GOAL #1   Title  Pt will be IND in HEP to improve strength, balance, endurance, and flexibililty TARGET FOR ALL  STGS: 3 TREATMENT SESSIONS.    Baseline  Pt performing HEP IND at home.     Status  Achieved      PT SHORT TERM GOAL #2   Title  Trial R AFO and determine best AD for amb. and safety during mobility.     Baseline  PT and pt have trialed R AFO braces and pt demonstrates improve R toe clearance with AFO vs. no AFO. However, we are awaiting orthotist consult so goal is only partially met.     Status  Partially Met      PT SHORT TERM GOAL #3   Title  Pt will improve gait speed with LRAD and R AFO/brace to >/=2.62 ft/sec. to safely amb. in the community.     Baseline  2.8f/sec and 2.81 ft/sec. with  R AFO donned and no AD    Status  Achieved        PT Long Term Goals - 04/28/17 1754      PT LONG TERM GOAL #1   Title  see prior progress notes for prior goals      PT LONG TERM GOAL #2   Status  --      PT LONG TERM GOAL #3   Title  Pt will improve DGI score to >/=20/24 with AFO/brace donned to decr. falls risk.     Baseline  11/24 at inital evaluation. 15/24 today     Status  On-going      PT LONG TERM GOAL #4   Title  Pt will perform TUG in </=13.5 sec. with LRAD and AFO to decr. falls risk.     Baseline  15.88 secounds at inital evaluation and 13.70 today(04/10/2017)    Status  On-going            Plan - 04/29/17 2019    Clinical Impression Statement  Session focused on gait training with clinic's rt anterior walk-on AFO, including stepping over and around obstacles with use of her cane. Patient with noted slight rt genu recurvatum and placed 1/4" heel lift in Rt shoe under AFO with improved knee control. She did not have problems with clearing Rt foot with heel lift in place. Additional exercises for HEP to improve rt knee control during gait. Patient very motivated and appreciative.     Rehab Potential  Good    Clinical Impairments Affecting Rehab Potential  see above.    PT Frequency  2x / week    PT Duration  4 weeks    PT Treatment/Interventions  ADLs/Self Care Home  Management;Biofeedback;Electrical Stimulation;Therapeutic activities;Therapeutic exercise;Manual techniques;Vestibular;Functional mobility training;Stair training;Gait training;Patient/family education;Orthotic Fit/Training;DME Instruction;Neuromuscular re-education;Balance training    PT Next Visit Plan  Continue to progress gait skills( stepping over objects, stairs, curbs and ramps) as well as continue to improve ambulation distance. Use clinic rt anterior toe-off AFO until her's arrives.     Consulted and Agree with Plan of Care  Patient       Patient will benefit from skilled therapeutic intervention in order to improve the following deficits and impairments:  Abnormal gait, Decreased endurance, Impaired sensation, Decreased knowledge of use of DME, Decreased strength, Obesity, Decreased balance, Decreased mobility, Impaired flexibility, Postural dysfunction, Decreased range of motion  Visit Diagnosis: Other abnormalities of gait and mobility  Right foot drop  Muscle weakness (generalized)     Problem List Patient Active Problem List   Diagnosis Date Noted  . 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 Right thigh pain 01/17/2017  . Right foot drop 12/23/2016  . Chronic right-sided low back pain with right-sided sciatica 12/23/2016  . Pedal edema 10/07/2016  . Endometrial cancer ([place]) 08/05/2016  . Superficial postoperative wound infection 07/22/2016  . 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 Diabetic neuropathy ([place]) 05/27/2016  . Anemia 05/08/2016  . DUB (dysfunctional uterine bleeding) 05/08/2016  . Type 2 diabetes mellitus ([place]) 02/16/2016  . Morbid obesity ([place]) 04/12/2015  . Depression 04/12/2015  . Asthma 03/30/2015  . Hypoxemia 03/30/2015  . Hypothyroidism 03/30/2015  . Thrombocytopenia ([place]) 03/30/2015  . Hypokalemia 03/30/2015    Rexanne Mano, PT 04/29/2017, 8:25 PM  [facility] [address] [place], [place], [address] Phone: [phone]   Fax:  [phone]  Name: Janet [name] MRN: [id number] Date of Birth: 1970-12-28

## 2017-04-29 NOTE — Telephone Encounter (Addendum)
Pt called back, she would like rx faxed to [facility] (f) [phone] attn:  Referral Support. Thank you

## 2017-04-29 NOTE — Patient Instructions (Signed)
Knee Extension: Terminal - Standing (Single Leg)    Face anchor in shoulder width stance, band around knee. Allow tension of band to slightly bend knee. Pull leg back, straightening knee. Repeat _20_ times per set.  Do _2_ sets per session. Do _5_ sessions per week. Anchor Height: Knee  http://tub.exer.us/36   Copyright  VHI. All rights reserved.    Knee Flexion: Resisted (Sitting)    Sit with band closed in the door and looped around ankle right leg. Pull foot back to bend knee. Hold 2 seconds and relax.  Repeat __10__ times per set. Do _2___ sets per session.  http://orth.exer.us/695   Copyright  VHI. All rights reserved.

## 2017-05-01 ENCOUNTER — Telehealth: Payer: Self-pay | Admitting: *Deleted

## 2017-05-01 ENCOUNTER — Ambulatory Visit: Payer: Medicaid Other | Admitting: Physical Therapy

## 2017-05-01 NOTE — Telephone Encounter (Signed)
Faxed signed order back to [facility] re: Right AFO ant tib prefab tcf. Expected length of need: 99+, prognosis: good, medical necessity:antalgic gait ab. Dragging right leg, diffculty with right ankle dorsi flexion. Fax: [phone]. Received fax confirmation.

## 2017-05-02 ENCOUNTER — Ambulatory Visit: Payer: Medicaid Other | Admitting: Gynecologic Oncology

## 2017-05-06 ENCOUNTER — Ambulatory Visit: Payer: Medicaid Other

## 2017-05-06 DIAGNOSIS — R2689 Other abnormalities of gait and mobility: Secondary | ICD-10-CM

## 2017-05-06 DIAGNOSIS — M6281 Muscle weakness (generalized): Secondary | ICD-10-CM

## 2017-05-06 NOTE — Therapy (Signed)
[facility] [address] [place], [place], [address] Phone: [phone]   Fax:  [phone]  Physical Therapy Treatment  Patient Details  Name: Janet [name] MRN: [id number] Date of Birth: May 18, 1970 Referring Provider: Dr. Krista Blue   Encounter Date: 05/06/2017  PT End of Session - 05/06/17 1243    Visit Number  15    Number of Visits  20    Date for PT Re-Evaluation  05/22/17    Authorization Type  Medicaid: 12 visits approved through 03/14/2017--04/10/2017; additional 8 visits approved 04/25/17-05/22/17    Authorization - Visit Number  15    Authorization - Number of Visits  20    PT Start Time  0846    PT Stop Time  0930    PT Time Calculation (min)  44 min    Equipment Utilized During Treatment  Gait belt    Activity Tolerance  Patient tolerated treatment well    Behavior During Therapy  [facility] for tasks assessed/performed       Past Medical History:  Diagnosis Date  . Anemia   . Asthma   . Depression   . Hypothyroidism   . Pre-diabetes   . Right foot drop   . Sleep apnea   . Thyroid disease   . Uterine cancer [facility])     Past Surgical History:  Procedure Laterality Date  . CARPAL TUNNEL RELEASE    . CESAREAN SECTION    . CHOLECYSTECTOMY    . cyst removal     uterus  . DILATION AND CURETTAGE OF UTERUS     still birth  . DILATION AND CURETTAGE OF UTERUS N/A 06/11/2016   Procedure: DILATATION AND CURETTAGE;  Surgeon: Emily Filbert, MD;  Location: [facility];  Service: Gynecology;  Laterality: N/A;  . ROBOTIC ASSISTED TOTAL HYSTERECTOMY WITH BILATERAL SALPINGO OOPHERECTOMY Bilateral 07/09/2016   Procedure: XI ROBOTIC ASSISTED TOTAL HYSTERECTOMY WITH BILATERAL SALPINGO OOPHORECTOMY FOR UTERUS GREATER THAN 250 GRAMS, LYSIS OF ADHESIONS;  Surgeon: Everitt Amber, MD;  Location: [facility];  Service: Gynecology;  Laterality: Bilateral;  . SINUS EXPLORATION    . WISDOM TOOTH EXTRACTION      There were no vitals filed for this  visit.  Subjective Assessment - 05/06/17 0849    Subjective  pt reports back pain has been bothering her more than anything today. pt reports no fall since last session. pt reports that she is going to get her AFO after PT today. Following PT session patient reported mild increase in low back pain (5/10 from 4/10)    Pertinent History  Asthma, hypothyroidism, diabetic neuropathy (N/T starting below knee to feet), chronic R LBP with sciatic, hx endometrial CA (MD just cleared pt) with hysterectomy, thrombocytopenia, depression, anemia, sleep apnea, left leg 1.5 inches shorter (used a built up shoe in highschool)    Patient Stated Goals  To walk longer distances and without pain (and normal)    Currently in Pain?  Yes    Pain Score  4     Pain Location  Back    Pain Orientation  Lower    Pain Descriptors / Indicators  Aching    Pain Type  Acute pain    Pain Onset  More than a month ago    Pain Frequency  Constant                No data recorded      05/06/17 0846  Ambulation/Gait  Ambulation/Gait Yes  Ambulation/Gait Assistance 6: Modified  independent (Device/Increase time);5: Supervision  Ambulation Distance (Feet) 115 Feet [phone])  Assistive device Straight cane (and R AFO)  Gait Pattern Step-through pattern;Decreased stride length;Decreased weight shift to right;Wide base of support;Decreased trunk rotation;Right foot flat;Left hip hike  Ambulation Surface Level;Unlevel;Indoor  Stairs Yes  Stairs Assistance 5: Supervision  Stair Management Technique Two rails;Forwards  Number of Stairs 4  Height of Stairs 6  Ramp 6: Modified independent (Device)  Curb 6: Modified independent (Device/increase time) (cane and AFO)  Gait Comments Left hip hike. Right foot toe off AFO, Left foot 1/4 heel wedge.    ambulation over foam with small  stepping stone under it. 3 small hurdles and around 4 cones.  all supervision / minG level.     Pt demonstrated proper technique and no LOB  during amb. PT continues to trial different heel wedges to decr. L hip hike 2/2 leg length discrepancy.  Pt required seated rest breaks 2/2 fatigue and LBP.        PT Education - 05/06/17 1242    Education provided  Yes    Education Details  AFO use, Heel wedge and gait impact. stair training    Person(s) Educated  Patient    Methods  Explanation;Demonstration;Tactile cues    Comprehension  Verbalized understanding;Returned demonstration       PT Short Term Goals - 03/06/17 0943      PT SHORT TERM GOAL #1   Title  Pt will be IND in HEP to improve strength, balance, endurance, and flexibililty TARGET FOR ALL STGS: 3 TREATMENT SESSIONS.    Baseline  Pt performing HEP IND at home.     Status  Achieved      PT SHORT TERM GOAL #2   Title  Trial R AFO and determine best AD for amb. and safety during mobility.     Baseline  PT and pt have trialed R AFO braces and pt demonstrates improve R toe clearance with AFO vs. no AFO. However, we are awaiting orthotist consult so goal is only partially met.     Status  Partially Met      PT SHORT TERM GOAL #3   Title  Pt will improve gait speed with LRAD and R AFO/brace to >/=2.62 ft/sec. to safely amb. in the community.     Baseline  2.44f/sec and 2.81 ft/sec. with  R AFO donned and no AD    Status  Achieved        PT Long Term Goals - 04/28/17 1754      PT LONG TERM GOAL #1   Title  see prior progress notes for prior goals      PT LONG TERM GOAL #2   Status  --      PT LONG TERM GOAL #3   Title  Pt will improve DGI score to >/=20/24 with AFO/brace donned to decr. falls risk.     Baseline  11/24 at inital evaluation. 15/24 today     Status  On-going      PT LONG TERM GOAL #4   Title  Pt will perform TUG in </=13.5 sec. with LRAD and AFO to decr. falls risk.     Baseline  15.88 secounds at inital evaluation and 13.70 today(04/10/2017)    Status  On-going            Plan - 05/06/17 1252    Clinical Impression Statement   During today's Pt session patient demonstrated continued progress with exercise/activity tolerance and muscular endurance. Patient  demonstrated stair case negotiation at a supervision level with step to pattern with no increase in low back pain and improved confidence. Pt also completed ambulation task over small obstacles and uneven surfaces with minimal apprehension.  Pt continues to ambulate with a left hip hike with and without Heel lift in shoe. Pt also continues to verbalize when she needs to rest in order to avoid exacerbation of low back pain.     Rehab Potential  Good    Clinical Impairments Affecting Rehab Potential  see above.    PT Frequency  2x / week    PT Duration  4 weeks    PT Treatment/Interventions  ADLs/Self Care Home Management;Biofeedback;Electrical Stimulation;Therapeutic activities;Therapeutic exercise;Manual techniques;Vestibular;Functional mobility training;Stair training;Gait training;Patient/family education;Orthotic Fit/Training;DME Instruction;Neuromuscular re-education;Balance training    PT Next Visit Plan  continue to progress stair trainging skills with AFO as well as improve ambulation distance with AFO and cane.     Consulted and Agree with Plan of Care  Patient       Patient will benefit from skilled therapeutic intervention in order to improve the following deficits and impairments:  Abnormal gait, Decreased endurance, Impaired sensation, Decreased knowledge of use of DME, Decreased strength, Obesity, Decreased balance, Decreased mobility, Impaired flexibility, Postural dysfunction, Decreased range of motion  Visit Diagnosis: Other abnormalities of gait and mobility  Right foot drop  Chronic right-sided low back pain with right-sided sciatica  Muscle weakness (generalized)  Other disturbances of skin sensation     Problem List Patient Active Problem List   Diagnosis Date Noted  . 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  Right thigh pain 01/17/2017  . Right foot drop 12/23/2016  . Chronic right-sided low back pain with right-sided sciatica 12/23/2016  . Pedal edema 10/07/2016  . Endometrial cancer ([place]) 08/05/2016  . Superficial postoperative wound infection 07/22/2016  . 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 Diabetic neuropathy ([place]) 05/27/2016  . Anemia 05/08/2016  . DUB (dysfunctional uterine bleeding) 05/08/2016  . Type 2 diabetes mellitus ([place]) 02/16/2016  . Morbid obesity ([place]) 04/12/2015  . Depression 04/12/2015  . Asthma 03/30/2015  . Hypoxemia 03/30/2015  . Hypothyroidism 03/30/2015  . Thrombocytopenia ([place]) 03/30/2015  . Hypokalemia 03/30/2015    Waunita Schooner SPT 05/06/2017, 12:53 PM  [facility] [address] [place], [place], [address] Phone: [phone]   Fax:  [phone]  Name: Lorine Iannaccone MRN: [id number] Date of Birth: [birth year]

## 2017-05-08 ENCOUNTER — Ambulatory Visit: Payer: Medicaid Other | Admitting: Physical Therapy

## 2017-05-08 ENCOUNTER — Encounter: Payer: Self-pay | Admitting: Physical Therapy

## 2017-05-08 DIAGNOSIS — M6281 Muscle weakness (generalized): Secondary | ICD-10-CM

## 2017-05-08 DIAGNOSIS — R2689 Other abnormalities of gait and mobility: Secondary | ICD-10-CM | POA: Diagnosis not present

## 2017-05-09 ENCOUNTER — Ambulatory Visit: Payer: Medicaid Other

## 2017-05-09 NOTE — Therapy (Signed)
[facility] [address] [place], [place], [address] Phone: [phone]   Fax:  [phone]  Physical Therapy Treatment  Patient Details  Name: Janet [name] MRN: [id number] Date of Birth: June 02, 1970 Referring Provider: Dr. Krista Blue   Encounter Date: 05/08/2017  PT End of Session - 05/08/17 1847    Visit Number  16    Number of Visits  20    Date for PT Re-Evaluation  05/22/17    Authorization Type  Medicaid: 12 visits approved through 03/14/2017--04/10/2017; additional 8 visits approved 04/25/17-05/22/17    Authorization - Visit Number  16    Authorization - Number of Visits  20    PT Start Time  0848  (Pended)     PT Stop Time  0930  (Pended)     PT Time Calculation (min)  42 min  (Pended)     Equipment Utilized During Treatment  --  (Pended)     Activity Tolerance  Patient tolerated treatment well    Behavior During Therapy  [facility] for tasks assessed/performed       Past Medical History:  Diagnosis Date  . Anemia   . Asthma   . Depression   . Hypothyroidism   . Pre-diabetes   . Right foot drop   . Sleep apnea   . Thyroid disease   . Uterine cancer [facility])     Past Surgical History:  Procedure Laterality Date  . CARPAL TUNNEL RELEASE    . CESAREAN SECTION    . CHOLECYSTECTOMY    . cyst removal     uterus  . DILATION AND CURETTAGE OF UTERUS     still birth  . DILATION AND CURETTAGE OF UTERUS N/A 06/11/2016   Procedure: DILATATION AND CURETTAGE;  Surgeon: Emily Filbert, MD;  Location: [facility];  Service: Gynecology;  Laterality: N/A;  . ROBOTIC ASSISTED TOTAL HYSTERECTOMY WITH BILATERAL SALPINGO OOPHERECTOMY Bilateral 07/09/2016   Procedure: XI ROBOTIC ASSISTED TOTAL HYSTERECTOMY WITH BILATERAL SALPINGO OOPHORECTOMY FOR UTERUS GREATER THAN 250 GRAMS, LYSIS OF ADHESIONS;  Surgeon: Everitt Amber, MD;  Location: [facility];  Service: Gynecology;  Laterality: Bilateral;  . SINUS EXPLORATION    . WISDOM TOOTH EXTRACTION       There were no vitals filed for this visit.  Subjective Assessment - 05/08/17 0850    Subjective  reports she could not get brace the other day because it was not approved. Was called and it is now ready, going today. States she will ask orthotist about options for shoe lift, including insurance coverage and cost.     Pertinent History  Asthma, hypothyroidism, diabetic neuropathy (N/T starting below knee to feet), chronic R LBP with sciatic, hx endometrial CA (MD just cleared pt) with hysterectomy, thrombocytopenia, depression, anemia, sleep apnea, left leg 1.5 inches shorter (used a built up shoe in highschool)    Patient Stated Goals  To walk longer distances and without pain (and normal)    Currently in Pain?  Yes    Pain Score  4     Pain Location  Leg    Pain Orientation  Right;Lower    Pain Descriptors / Indicators  Sore    Pain Type  Acute pain    Pain Onset  More than a month ago                       [facility] Adult PT Treatment/Exercise - 05/08/17 0017      Ambulation/Gait   Ambulation/Gait  Assistance  6: Modified independent (Device/Increase time);5: Supervision    Ambulation/Gait Assistance Details  with rt AFO (anterior walk-on)and 1/2" heel lift left shoe    Ambulation Distance (Feet)  600 Feet    Assistive device  Straight cane with quad rubber tip    Ambulation Surface  Level;Indoor    Stair Management Technique  One rail Left;Alternating pattern;Step to pattern;Forwards;With cane    Number of Stairs  12    Height of Stairs  6    Ramp  6: Modified independent (Device) educated in technique/risks for steeper ramp/hills    Curb  5: Supervision    Curb Details (indicate cue type and reason)  vc for sequencing for improved safety (pt tends to step up with weaker leg instead of strong leg)          Balance Exercises - 05/08/17 0931      Balance Exercises: Standing   Step Ups  Forward;4 inch;UE support 1 and backward with SPC x 5 reps each         PT Education - 05/08/17 1846    Education provided  Yes    Education Details  safe use of AFO for ramps, curbs, steps    Person(s) Educated  Patient    Methods  Explanation;Demonstration;Verbal cues    Comprehension  Verbalized understanding;Returned demonstration;Verbal cues required;Need further instruction       PT Short Term Goals - 03/06/17 0943      PT SHORT TERM GOAL #1   Title  Pt will be IND in HEP to improve strength, balance, endurance, and flexibililty TARGET FOR ALL STGS: 3 TREATMENT SESSIONS.    Baseline  Pt performing HEP IND at home.     Status  Achieved      PT SHORT TERM GOAL #2   Title  Trial R AFO and determine best AD for amb. and safety during mobility.     Baseline  PT and pt have trialed R AFO braces and pt demonstrates improve R toe clearance with AFO vs. no AFO. However, we are awaiting orthotist consult so goal is only partially met.     Status  Partially Met      PT SHORT TERM GOAL #3   Title  Pt will improve gait speed with LRAD and R AFO/brace to >/=2.62 ft/sec. to safely amb. in the community.     Baseline  2.49f/sec and 2.81 ft/sec. with  R AFO donned and no AD    Status  Achieved        PT Long Term Goals - 04/28/17 1754      PT LONG TERM GOAL #1   Title  see prior progress notes for prior goals      PT LONG TERM GOAL #2   Status  --      PT LONG TERM GOAL #3   Title  Pt will improve DGI score to >/=20/24 with AFO/brace donned to decr. falls risk.     Baseline  11/24 at inital evaluation. 15/24 today     Status  On-going      PT LONG TERM GOAL #4   Title  Pt will perform TUG in </=13.5 sec. with LRAD and AFO to decr. falls risk.     Baseline  15.88 secounds at inital evaluation and 13.70 today(04/10/2017)    Status  On-going            Plan - 05/09/17 0533    Clinical Impression Statement  Continue to progress patient's  safety and exercise tolerance via gait training with rt AFO (currently using clinic's AFO until she  gets her new brace). She required vc for safest technique for steep inclines/declines and descending steps with AFO. Back pain much less today and tolerated longer bout of activity prior to needing seated rest. Patient scheduled to get her new AFO today and will focus on safe use at upcoming visits.     Rehab Potential  Good    Clinical Impairments Affecting Rehab Potential  see above.    PT Frequency  2x / week    PT Duration  4 weeks    PT Treatment/Interventions  ADLs/Self Care Home Management;Biofeedback;Electrical Stimulation;Therapeutic activities;Therapeutic exercise;Manual techniques;Vestibular;Functional mobility training;Stair training;Gait training;Patient/family education;Orthotic Fit/Training;DME Instruction;Neuromuscular re-education;Balance training    PT Next Visit Plan  continue to progress stair trainging skills with AFO as well as improve ambulation distance with AFO and cane.     Consulted and Agree with Plan of Care  Patient       Patient will benefit from skilled therapeutic intervention in order to improve the following deficits and impairments:  Abnormal gait, Decreased endurance, Impaired sensation, Decreased knowledge of use of DME, Decreased strength, Obesity, Decreased balance, Decreased mobility, Impaired flexibility, Postural dysfunction, Decreased range of motion  Visit Diagnosis: Other abnormalities of gait and mobility  Muscle weakness (generalized)     Problem List Patient Active Problem List   Diagnosis Date Noted  . 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 Right thigh pain 01/17/2017  . Right foot drop 12/23/2016  . Chronic right-sided low back pain with right-sided sciatica 12/23/2016  . Pedal edema 10/07/2016  . Endometrial cancer ([place]) 08/05/2016  . Superficial postoperative wound infection 07/22/2016  . 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 Diabetic neuropathy ([place]) 05/27/2016  . Anemia 05/08/2016  .  DUB (dysfunctional uterine bleeding) 05/08/2016  . Type 2 diabetes mellitus ([place]) 02/16/2016  . Morbid obesity ([place]) 04/12/2015  . Depression 04/12/2015  . Asthma 03/30/2015  . Hypoxemia 03/30/2015  . Hypothyroidism 03/30/2015  . Thrombocytopenia ([place]) 03/30/2015  . Hypokalemia 03/30/2015    Rexanne Mano, PT 05/09/2017, 5:41 AM  [facility] [address] [place], [place], [address] Phone: [phone]   Fax:  [phone]  Name: Clodagh Odenthal MRN: [id number] Date of Birth: 29-Apr-1970

## 2017-05-13 ENCOUNTER — Ambulatory Visit: Payer: Medicaid Other | Attending: Family Medicine

## 2017-05-13 DIAGNOSIS — R2689 Other abnormalities of gait and mobility: Secondary | ICD-10-CM | POA: Diagnosis present

## 2017-05-13 DIAGNOSIS — G8929 Other chronic pain: Secondary | ICD-10-CM | POA: Diagnosis present

## 2017-05-13 DIAGNOSIS — R208 Other disturbances of skin sensation: Secondary | ICD-10-CM | POA: Diagnosis present

## 2017-05-13 DIAGNOSIS — M21371 Foot drop, right foot: Secondary | ICD-10-CM | POA: Diagnosis present

## 2017-05-13 DIAGNOSIS — M5441 Lumbago with sciatica, right side: Secondary | ICD-10-CM | POA: Insufficient documentation

## 2017-05-13 DIAGNOSIS — M6281 Muscle weakness (generalized): Secondary | ICD-10-CM | POA: Diagnosis present

## 2017-05-13 NOTE — Therapy (Signed)
[facility] [address] [place], [place], [address] Phone: [phone]   Fax:  [phone]  Physical Therapy Treatment  Patient Details  Name: Janet [name] MRN: [id number] Date of Birth: 06-14-1970 Referring Provider: Dr. Krista Blue   Encounter Date: 05/13/2017  PT End of Session - 05/13/17 0851    Visit Number  17    Number of Visits  20    Date for PT Re-Evaluation  05/22/17    Authorization Type  Medicaid: 12 visits approved through 03/14/2017--04/10/2017; additional 8 visits approved 04/25/17-05/22/17    Authorization - Visit Number  17    Authorization - Number of Visits  20    PT Start Time  3419    PT Stop Time  0925    PT Time Calculation (min)  38 min    Equipment Utilized During Treatment  Gait belt    Activity Tolerance  Patient tolerated treatment well    Behavior During Therapy  [facility] for tasks assessed/performed       Past Medical History:  Diagnosis Date  . Anemia   . Asthma   . Depression   . Hypothyroidism   . Pre-diabetes   . Right foot drop   . Sleep apnea   . Thyroid disease   . Uterine cancer [facility])     Past Surgical History:  Procedure Laterality Date  . CARPAL TUNNEL RELEASE    . CESAREAN SECTION    . CHOLECYSTECTOMY    . cyst removal     uterus  . DILATION AND CURETTAGE OF UTERUS     still birth  . DILATION AND CURETTAGE OF UTERUS N/A 06/11/2016   Procedure: DILATATION AND CURETTAGE;  Surgeon: Emily Filbert, MD;  Location: [facility];  Service: Gynecology;  Laterality: N/A;  . ROBOTIC ASSISTED TOTAL HYSTERECTOMY WITH BILATERAL SALPINGO OOPHERECTOMY Bilateral 07/09/2016   Procedure: XI ROBOTIC ASSISTED TOTAL HYSTERECTOMY WITH BILATERAL SALPINGO OOPHORECTOMY FOR UTERUS GREATER THAN 250 GRAMS, LYSIS OF ADHESIONS;  Surgeon: Everitt Amber, MD;  Location: [facility];  Service: Gynecology;  Laterality: Bilateral;  . SINUS EXPLORATION    . WISDOM TOOTH EXTRACTION      There were no vitals filed for this  visit.  Subjective Assessment - 05/13/17 0847    Subjective  pt no falls since last PT session. Pt arrives to PT session with  R AFO and 1.5cm heel wedge on the L shoe. pt also brought her quad tip cane to PT session     Pertinent History  Asthma, hypothyroidism, diabetic neuropathy (N/T starting below knee to feet), chronic R LBP with sciatic, hx endometrial CA (MD just cleared pt) with hysterectomy, thrombocytopenia, depression, anemia, sleep apnea, left leg 1.5 inches shorter (used a built up shoe in highschool)    Patient Stated Goals  To walk longer distances and without pain (and normal)    Currently in Pain?  No/denies                     [facility] Adult PT Treatment/Exercise - 05/13/17 0847      Ambulation/Gait   Ambulation/Gait  Yes    Ambulation/Gait Assistance  6: Modified independent (Device/Increase time);5: Supervision    Ambulation/Gait Assistance Details  R AFO and L heel wedge    Ambulation Distance (Feet)  450 Feet 400x2 ( over foam with objects under foam). minG    Assistive device  Straight cane    Gait Pattern  Step-through pattern;Decreased stride length;Decreased weight shift to right;Wide  base of support;Decreased trunk rotation;Right foot flat;Left hip hike    Ambulation Surface  Level;Unlevel;Indoor    Curb  6: Modified independent (Device/increase time)    Gait Comments  curb training: step up on 6in block fwd, Lat, and Bkwd, 5 each in // bars minG. S to ensure safety over uneven terrain. Pt required seated rest breaks during amb. to allow LBP to subside.                PT Education - 05/13/17 0908    Education provided  Yes    Education Details  AFO, Heel wedge, coverage of AD, changes in gait mechanics    Person(s) Educated  Patient    Methods  Explanation;Demonstration    Comprehension  Verbalized understanding;Returned demonstration       PT Short Term Goals - 03/06/17 0943      PT SHORT TERM GOAL #1   Title  Pt will be IND in HEP to  improve strength, balance, endurance, and flexibililty TARGET FOR ALL STGS: 3 TREATMENT SESSIONS.    Baseline  Pt performing HEP IND at home.     Status  Achieved      PT SHORT TERM GOAL #2   Title  Trial R AFO and determine best AD for amb. and safety during mobility.     Baseline  PT and pt have trialed R AFO braces and pt demonstrates improve R toe clearance with AFO vs. no AFO. However, we are awaiting orthotist consult so goal is only partially met.     Status  Partially Met      PT SHORT TERM GOAL #3   Title  Pt will improve gait speed with LRAD and R AFO/brace to >/=2.62 ft/sec. to safely amb. in the community.     Baseline  2.60f/sec and 2.81 ft/sec. with  R AFO donned and no AD    Status  Achieved        PT Long Term Goals - 05/13/17 1525      PT LONG TERM GOAL #1   Title  see prior progress notes for prior goals      PT LONG TERM GOAL #3   Title  Pt will improve DGI score to >/=20/24 with AFO/brace donned to decr. falls risk. All unmet goals will be carried over to new POC: 8 addtional visits-date cut-off: 05/22/17    Baseline  11/24 at inital evaluation. 15/24 today     Status  On-going      PT LONG TERM GOAL #4   Title  Pt will perform TUG in </=13.5 sec. with LRAD and AFO to decr. falls risk.     Baseline  15.88 secounds at inital evaluation and 13.70 today(04/10/2017)    Status  On-going          Plan - 05/13/17 1137    Clinical Impression Statement  During today's PT session patient utilized her new posterior AFO and 1.5cm heel wedge. Patient demonstrated significant improvements in gait (reduction in gait deviations). Although, has patient fatigued during session her gait deviations were magnified. Pt instructed to keep taking rest breaks on the early onset of LBP symptoms. Patient continues to demonstrate improvements in gait confidence over uneven surfaces, curbs and ramps. Patient is on track to meet goals and D/C  at current anticipated date.     Rehab  Potential  Good    Clinical Impairments Affecting Rehab Potential  see above.    PT Frequency  2x / week  PT Duration  4 weeks    PT Treatment/Interventions  ADLs/Self Care Home Management;Biofeedback;Electrical Stimulation;Therapeutic activities;Therapeutic exercise;Manual techniques;Vestibular;Functional mobility training;Stair training;Gait training;Patient/family education;Orthotic Fit/Training;DME Instruction;Neuromuscular re-education;Balance training    PT Next Visit Plan  outdoor ambulation  for distance over grass, pavement, and other uneven surfaces.     Consulted and Agree with Plan of Care  Patient       Patient will benefit from skilled therapeutic intervention in order to improve the following deficits and impairments:  Abnormal gait, Decreased endurance, Impaired sensation, Decreased knowledge of use of DME, Decreased strength, Obesity, Decreased balance, Decreased mobility, Impaired flexibility, Postural dysfunction, Decreased range of motion  Visit Diagnosis: Other abnormalities of gait and mobility  Muscle weakness (generalized)  Right foot drop  Chronic right-sided low back pain with right-sided sciatica  Other disturbances of skin sensation     Problem List Patient Active Problem List   Diagnosis Date Noted  . 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 Right thigh pain 01/17/2017  . Right foot drop 12/23/2016  . Chronic right-sided low back pain with right-sided sciatica 12/23/2016  . Pedal edema 10/07/2016  . Endometrial cancer ([place]) 08/05/2016  . Superficial postoperative wound infection 07/22/2016  . 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 Diabetic neuropathy ([place]) 05/27/2016  . Anemia 05/08/2016  . DUB (dysfunctional uterine bleeding) 05/08/2016  . Type 2 diabetes mellitus ([place]) 02/16/2016  . Morbid obesity ([place]) 04/12/2015  . Depression 04/12/2015  . Asthma 03/30/2015  . Hypoxemia 03/30/2015  .  Hypothyroidism 03/30/2015  . Thrombocytopenia ([place]) 03/30/2015  . Hypokalemia 03/30/2015    Waunita Schooner SPT 05/13/2017, 11:39 AM  [facility] [address] [place], [place], [address] Phone: [phone]   Fax:  [phone]  Name: Larysa Pall MRN: [id number] Date of Birth: 1970/04/09

## 2017-05-16 ENCOUNTER — Ambulatory Visit: Payer: Medicaid Other

## 2017-05-20 ENCOUNTER — Ambulatory Visit: Payer: Medicaid Other

## 2017-05-20 DIAGNOSIS — R2689 Other abnormalities of gait and mobility: Secondary | ICD-10-CM

## 2017-05-20 DIAGNOSIS — M6281 Muscle weakness (generalized): Secondary | ICD-10-CM

## 2017-05-20 NOTE — Therapy (Addendum)
[facility] [address] [place], [place], [address] Phone: [phone]   Fax:  [phone]  Physical Therapy Treatment  Patient Details  Name: Janet [name] MRN: [id number] Date of Birth: 03-Aug-1970 Referring Provider: Dr. Krista Blue   Encounter Date: 05/20/2017  PT End of Session - 05/20/17 0935    Visit Number  18    Number of Visits  20    Date for PT Re-Evaluation  05/22/17    Authorization Type  Medicaid: 12 visits approved through 03/14/2017--04/10/2017; additional 8 visits approved 04/25/17-05/22/17    Authorization - Visit Number  18    Authorization - Number of Visits  20    PT Start Time  8502    PT Stop Time  0920 pt requested to levae early    PT Time Calculation (min)  30 min    Equipment Utilized During Treatment  -- S prn    Activity Tolerance  Patient tolerated treatment well    Behavior During Therapy  [facility] for tasks assessed/performed       Past Medical History:  Diagnosis Date  . Anemia   . Asthma   . Depression   . Hypothyroidism   . Pre-diabetes   . Right foot drop   . Sleep apnea   . Thyroid disease   . Uterine cancer [facility])     Past Surgical History:  Procedure Laterality Date  . CARPAL TUNNEL RELEASE    . CESAREAN SECTION    . CHOLECYSTECTOMY    . cyst removal     uterus  . DILATION AND CURETTAGE OF UTERUS     still birth  . DILATION AND CURETTAGE OF UTERUS N/A 06/11/2016   Procedure: DILATATION AND CURETTAGE;  Surgeon: Emily Filbert, MD;  Location: [facility];  Service: Gynecology;  Laterality: N/A;  . ROBOTIC ASSISTED TOTAL HYSTERECTOMY WITH BILATERAL SALPINGO OOPHERECTOMY Bilateral 07/09/2016   Procedure: XI ROBOTIC ASSISTED TOTAL HYSTERECTOMY WITH BILATERAL SALPINGO OOPHORECTOMY FOR UTERUS GREATER THAN 250 GRAMS, LYSIS OF ADHESIONS;  Surgeon: Everitt Amber, MD;  Location: [facility];  Service: Gynecology;  Laterality: Bilateral;  . SINUS EXPLORATION    . WISDOM TOOTH EXTRACTION      There were  no vitals filed for this visit.  Subjective Assessment - 05/20/17 0854    Subjective  Pt denied changes since last visit. pt reported she had a near fall on steps but was able to catch herself and not fall. Pt requested to leave early, as she has bible study.     Pertinent History  Asthma, hypothyroidism, diabetic neuropathy (N/T starting below knee to feet), chronic R LBP with sciatic, hx endometrial CA (MD just cleared pt) with hysterectomy, thrombocytopenia, depression, anemia, sleep apnea, left leg 1.5 inches shorter (used a built up shoe in highschool)    Patient Stated Goals  To walk longer distances and without pain (and normal)    Currently in Pain?  No/denies        Therex and neuro re-ed:  Pt performed in seated and standing positions. Pt able to progress hip abd. From seated to standing. Cues and demo for technique. Performed with S to ensure safety.  No pain reported and pt did not require rest breaks. Please see pt instructions for HEP details.                       PT Education - 05/20/17 0934    Education provided  Yes    Education Details  PT reviewed HEP and progressed/modified exercises as deemed appropriate. PT discussed that next visit will likely be d/c based on pt's progress. PT provided pt with new handout of HEP.     Person(s) Educated  Patient    Methods  Explanation;Handout;Verbal cues;Demonstration    Comprehension  Verbalized understanding;Returned demonstration       PT Short Term Goals - 03/06/17 0943      PT SHORT TERM GOAL #1   Title  Pt will be IND in HEP to improve strength, balance, endurance, and flexibililty TARGET FOR ALL STGS: 3 TREATMENT SESSIONS.    Baseline  Pt performing HEP IND at home.     Status  Achieved      PT SHORT TERM GOAL #2   Title  Trial R AFO and determine best AD for amb. and safety during mobility.     Baseline  PT and pt have trialed R AFO braces and pt demonstrates improve R toe clearance with AFO vs. no  AFO. However, we are awaiting orthotist consult so goal is only partially met.     Status  Partially Met      PT SHORT TERM GOAL #3   Title  Pt will improve gait speed with LRAD and R AFO/brace to >/=2.62 ft/sec. to safely amb. in the community.     Baseline  2.23f/sec and 2.81 ft/sec. with  R AFO donned and no AD    Status  Achieved        PT Long Term Goals - 05/13/17 1525      PT LONG TERM GOAL #1   Title  see prior progress notes for prior goals      PT LONG TERM GOAL #3   Title  Pt will improve DGI score to >/=20/24 with AFO/brace donned to decr. falls risk. All unmet goals will be carried over to new POC: 8 addtional visits-date cut-off: 05/22/17    Baseline  11/24 at inital evaluation. 15/24 today     Status  On-going      PT LONG TERM GOAL #4   Title  Pt will perform TUG in </=13.5 sec. with LRAD and AFO to decr. falls risk.     Baseline  15.88 secounds at inital evaluation and 13.70 today(04/10/2017)    Status  On-going            Plan - 05/20/17 0936    Clinical Impression Statement  Today's skilled session was focused on modifying and progressing pt's HEP. Pt demonstrated strength progress, as she was able to tolerate progression of seated hip abduction to standing hip abduction. Pt also demonstrated improvement as she denied LBP during session. Continue with POC.     Rehab Potential  Good    Clinical Impairments Affecting Rehab Potential  see above.    PT Frequency  2x / week    PT Duration  4 weeks    PT Treatment/Interventions  ADLs/Self Care Home Management;Biofeedback;Electrical Stimulation;Therapeutic activities;Therapeutic exercise;Manual techniques;Vestibular;Functional mobility training;Stair training;Gait training;Patient/family education;Orthotic Fit/Training;DME Instruction;Neuromuscular re-education;Balance training    PT Next Visit Plan  check goals and likely d/c.     Consulted and Agree with Plan of Care  Patient       Patient will benefit from  skilled therapeutic intervention in order to improve the following deficits and impairments:  Abnormal gait, Decreased endurance, Impaired sensation, Decreased knowledge of use of DME, Decreased strength, Obesity, Decreased balance, Decreased mobility, Impaired flexibility, Postural dysfunction, Decreased range of motion  Visit Diagnosis: Muscle weakness (  generalized)  Other abnormalities of gait and mobility     Problem List Patient Active Problem List   Diagnosis Date Noted  . 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 Right thigh pain 01/17/2017  . Right foot drop 12/23/2016  . Chronic right-sided low back pain with right-sided sciatica 12/23/2016  . Pedal edema 10/07/2016  . Endometrial cancer ([place]) 08/05/2016  . Superficial postoperative wound infection 07/22/2016  . 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 Diabetic neuropathy ([place]) 05/27/2016  . Anemia 05/08/2016  . DUB (dysfunctional uterine bleeding) 05/08/2016  . Type 2 diabetes mellitus ([place]) 02/16/2016  . Morbid obesity ([place]) 04/12/2015  . Depression 04/12/2015  . Asthma 03/30/2015  . Hypoxemia 03/30/2015  . Hypothyroidism 03/30/2015  . Thrombocytopenia ([place]) 03/30/2015  . Hypokalemia 03/30/2015    Arling Cerone L 05/20/2017, 9:38 AM  [facility] [address] [place], [place], [address] Phone: [phone]   Fax:  [phone]  Name: Janet [name] MRN: [id number] Date of Birth: 09-Oct-1970  Geoffry Paradise, PT,DPT 05/20/17 9:42 AM Phone: [phone] Fax: [phone]

## 2017-05-20 NOTE — Patient Instructions (Signed)
Hamstring Stretch, Seated    Sit with one leg extended onto facing chair. Bend at hips, so back is straight. Hold for __30__ seconds. Repeat __3__ times each leg. Perform 2-3 times a day.  Copyright  VHI. All rights reserved.   Gastroc / Heel Cord Stretch - Seated With Towel    Sit on floor, towel./sheet around ball of foot. Gently pull right foot in toward body, stretching heel cord and calf. Hold for _30__ seconds.  Repeat _3__ times. Do _2-3__ times per day.  Copyright  VHI. All rights reserved.   Gastroc Stretch - Standing    Stand with left leg straight, heel on floor, toes pointed slightly inward. Lean into wall until stretch is felt in calf. Hold for _30__ seconds. DON'T PERFORM WITH RIGHT FOOT. Repeat _3__ times. Do _2-3__ times per day.  Copyright  VHI. All rights reserved.   Toe / Heel Raise (Sitting)    Sitting, raise heels, then rock back on heels and raise toes. Repeat __5__ times. Relax and then perform a total of 3 sets of 5 reps total. Perform every other day.   Copyright  VHI. All rights reserved.   ANKLE: Eversion, Bilateral    Sit at edge of surface, feet on floor. Raise toes of right foot up and move them away from body. Do not move hips or knees. _5__ reps per set, _3__ sets per day, __3_ days per week. You can perform with Left foot too, but main focus is on Right foot at this time.   Copyright  VHI. All rights reserved.   Functional Quadriceps: Sit to Stand    Sit on edge of chair, feet flat on floor. Stand upright, extending knees fully. Repeat __10__ times per set. Do __2__ sets per session. Do _3___ sessions per week.  http://orth.exer.us/735   Copyright  VHI. All rights reserved.   ABDUCTION: Sitting (Active)    Sit with feet flat. Lift right leg slightly and draw it out to side. Repeat with other leg. Complete _3__ sets of _10__ repetitions. Perform _3__ sessions per week.  Copyright  VHI. All rights reserved.    Feet Together, Head Motion - Eyes Closed    With eyes closed and feet together, move head slowly, up and down 5 times and side to side 5 times. Repeat __3__ times per session. Do __1__ sessions per day.  Copyright  VHI. All rights reserved.   Feet Together, Varied Arm Positions - Eyes Closed    Stand with feet together and arms at your side. Close eyes and visualize upright position. Hold __30__ seconds. Repeat __3__ times per session. Do __1__ sessions per day.  Copyright  VHI. All rights reserved.   Single Leg - Eyes Open    Holding support, lift right leg while maintaining balance over other leg. Progress to removing hands from support surface for longer periods of time. Hold__10__ seconds. Repeat with the other leg. Repeat __3__ times per session. Do __1__ sessions per day.  Copyright  VHI. All rights reserved.   Lower Trunk Rotation Stretch    Keeping back flat and feet together, rotate knees to left side.  Rocking hips side to side for 1-2 min. 3 x day. Try for first thing in the morning   http://orth.exer.us/123   Copyright  VHI. All rights reserved.  Diaphragmatic Breathing - Supine    Hands on top of navel, lips closed, breathe in through nose filling stomach with air, navel moves out toward hands. Exhale through puckered lips,  hands follow exhalation in. Repeat __5-10_ times. 4 seconds breathing out . Out and 2 seconds breathing in  Do _3-4x__ times per day.  Copyright  VHI. All rights reserved.   Pelvic Tilt: Posterior - Legs Bent (Supine)    Tighten stomach and flatten back by rolling pelvis down. Hold 5 breaths. Relax. Repeat 3_ times per set.  Due 3x day in the morning and in evening before bed is a great time.  http://orth.exer.us/203   Copyright  VHI. All rights reserved  Knee Extension: Terminal - Standing (Single Leg)    Face anchor in shoulder width stance, band around knee. Allow tension of band to slightly bend knee.  Pull leg back, straightening knee. Repeat _20_ times per set.  Do _2_ sets per session. Do _5_ sessions per week. Anchor Height: Knee  http://tub.exer.us/36   Copyright  VHI. All rights reserved

## 2017-05-22 ENCOUNTER — Ambulatory Visit: Payer: Medicaid Other

## 2017-05-22 DIAGNOSIS — M5441 Lumbago with sciatica, right side: Secondary | ICD-10-CM

## 2017-05-22 DIAGNOSIS — R208 Other disturbances of skin sensation: Secondary | ICD-10-CM

## 2017-05-22 DIAGNOSIS — M21371 Foot drop, right foot: Secondary | ICD-10-CM

## 2017-05-22 DIAGNOSIS — G8929 Other chronic pain: Secondary | ICD-10-CM

## 2017-05-22 DIAGNOSIS — M6281 Muscle weakness (generalized): Secondary | ICD-10-CM

## 2017-05-22 DIAGNOSIS — R2689 Other abnormalities of gait and mobility: Secondary | ICD-10-CM | POA: Diagnosis not present

## 2017-05-22 NOTE — Therapy (Signed)
[facility] [address] [place], [place], [address] Phone: [phone]   Fax:  [phone]  Physical Therapy Treatment  Patient Details  Name: Janet [name] MRN: [id number] Date of Birth: 1971/01/12 Referring Provider: Dr. Krista Blue   Encounter Date: 05/22/2017  PT End of Session - 05/22/17 1521    Visit Number  19    Number of Visits  20    Date for PT Re-Evaluation  05/22/17    Authorization Type  Medicaid: 12 visits approved through 03/14/2017--04/10/2017; additional 8 visits approved 04/25/17-05/22/17    Authorization - Visit Number  19    Authorization - Number of Visits  20    PT Start Time  0845    PT Stop Time  0924    PT Time Calculation (min)  39 min    Equipment Utilized During Treatment  Gait belt    Activity Tolerance  Patient tolerated treatment well    Behavior During Therapy  [facility] for tasks assessed/performed       Past Medical History:  Diagnosis Date  . Anemia   . Asthma   . Depression   . Hypothyroidism   . Pre-diabetes   . Right foot drop   . Sleep apnea   . Thyroid disease   . Uterine cancer [facility])     Past Surgical History:  Procedure Laterality Date  . CARPAL TUNNEL RELEASE    . CESAREAN SECTION    . CHOLECYSTECTOMY    . cyst removal     uterus  . DILATION AND CURETTAGE OF UTERUS     still birth  . DILATION AND CURETTAGE OF UTERUS N/A 06/11/2016   Procedure: DILATATION AND CURETTAGE;  Surgeon: Emily Filbert, MD;  Location: [facility];  Service: Gynecology;  Laterality: N/A;  . ROBOTIC ASSISTED TOTAL HYSTERECTOMY WITH BILATERAL SALPINGO OOPHERECTOMY Bilateral 07/09/2016   Procedure: XI ROBOTIC ASSISTED TOTAL HYSTERECTOMY WITH BILATERAL SALPINGO OOPHORECTOMY FOR UTERUS GREATER THAN 250 GRAMS, LYSIS OF ADHESIONS;  Surgeon: Everitt Amber, MD;  Location: [facility];  Service: Gynecology;  Laterality: Bilateral;  . SINUS EXPLORATION    . WISDOM TOOTH EXTRACTION      There were no vitals filed for this  visit.  Subjective Assessment - 05/22/17 1517    Subjective  Pt repots no  falls since last pt session. she reports he has noticed landing heavier on her L foot during longer distance gait training during PT session.     Pertinent History  Asthma, hypothyroidism, diabetic neuropathy (N/T starting below knee to feet), chronic R LBP with sciatic, hx endometrial CA (MD just cleared pt) with hysterectomy, thrombocytopenia, depression, anemia, sleep apnea, left leg 1.5 inches shorter (used a built up shoe in highschool)    Patient Stated Goals  To walk longer distances and without pain (and normal)    Currently in Pain?  No/denies                        [facility] Adult PT Treatment/Exercise - 05/22/17 0001      Transfers   Transfers  Sit to Stand;Stand to Sit    Sit to Stand  6: Modified independent (Device/Increase time);With upper extremity assist;From elevated surface;With armrests;From chair/3-in-1;From bed    Stand to Sit  6: Modified independent (Device/Increase time);With upper extremity assist;With armrests;To bed;To chair/3-in-1      Ambulation/Gait   Ambulation/Gait  Yes    Ambulation/Gait Assistance  6: Modified independent (Device/Increase time);5: Supervision  Ambulation/Gait Assistance Details  R AFO and L heel lift.     Ambulation Distance (Feet)  230 Feet    Assistive device  Straight cane    Gait Pattern  Step-through pattern;Decreased stride length;Decreased weight shift to right;Wide base of support;Decreased trunk rotation;Right foot flat;Left hip hike    Ambulation Surface  Level;Indoor    Stairs  Yes    Stairs Assistance  6: Modified independent (Device/Increase time)    Stair Management Technique  One rail Left;Step to pattern;Forwards;With cane    Number of Stairs  4    Height of Stairs  6    Gait Comments  Pt ambulates with step       Dynamic Gait Index   Level Surface  Mild Impairment    Change in Gait Speed  Mild Impairment    Gait with  Horizontal Head Turns  Mild Impairment    Gait with Vertical Head Turns  Mild Impairment    Gait and Pivot Turn  Mild Impairment    Step Over Obstacle  Mild Impairment    Step Around Obstacles  Mild Impairment    Steps  Moderate Impairment    Total Score  15      Timed Up and Go Test   TUG  Normal TUG    Normal TUG (seconds)  12.68 12.68 cane Q tip and 13.31 wihtout cane             PT Education - 05/22/17 1518    Education provided  Yes    Education Details  D/C, falls reduction, gait deviations, rest breaks during long walks,     Person(s) Educated  Patient    Methods  Explanation;Demonstration    Comprehension  Verbalized understanding       PT Short Term Goals - 03/06/17 0943      PT SHORT TERM GOAL #1   Title  Pt will be IND in HEP to improve strength, balance, endurance, and flexibililty TARGET FOR ALL STGS: 3 TREATMENT SESSIONS.    Baseline  Pt performing HEP IND at home.     Status  Achieved      PT SHORT TERM GOAL #2   Title  Trial R AFO and determine best AD for amb. and safety during mobility.     Baseline  PT and pt have trialed R AFO braces and pt demonstrates improve R toe clearance with AFO vs. no AFO. However, we are awaiting orthotist consult so goal is only partially met.     Status  Partially Met      PT SHORT TERM GOAL #3   Title  Pt will improve gait speed with LRAD and R AFO/brace to >/=2.62 ft/sec. to safely amb. in the community.     Baseline  2.58f/sec and 2.81 ft/sec. with  R AFO donned and no AD    Status  Achieved        PT Long Term Goals - 05/22/17 0941      PT LONG TERM GOAL #1   Title  see prior progress notes for prior goals      PT LONG TERM GOAL #3   Title  Pt will improve DGI score to >/=20/24 with AFO/brace donned to decr. falls risk. All unmet goals will be carried over to new POC: 8 addtional visits-date cut-off: 05/22/17    Baseline   05/22/2017: partially met :11/24 at inital evaluation. 15/24 today     Status  Partially  Met  PT LONG TERM GOAL #4   Title  Pt will perform TUG in </=13.5 sec. with LRAD and AFO to decr. falls risk.     Baseline  MET 05/22/2017: 12.68 with quad tip cane and R AFO, 13.31 with R AFO only     Status  Achieved            Plan - 05/22/17 1606    Clinical Impression Statement  During today's PT session pt and PT discuss, and agree on, D/C form current PT plan of care. Pt continues to demonstrate significant improvement in gait mechanics and stability meeting 2/2 of her long term goals. Pt continues to have mild difficulty with stair training when she is not focused on the task. During PT session patient demonstrated an increase in Lt. Hip hike when walking without her cane. Patient is able to reduce hike when focusing on normal gait pattern and had not noticed gait deviation prior to this session. Patient is pain free during gait and deviation is most likely resulting from proximal hip weakness which patient is continuing to address with her HEP. Patient instructed to reach out to PT for further assistance with gait training if she feels she has regressed on her HEP.     Rehab Potential  Good    Clinical Impairments Affecting Rehab Potential  see above.    PT Next Visit Plan  D/C    Consulted and Agree with Plan of Care  Patient       Patient will benefit from skilled therapeutic intervention in order to improve the following deficits and impairments:  Abnormal gait, Decreased endurance, Impaired sensation, Decreased knowledge of use of DME, Decreased strength, Obesity, Decreased balance, Decreased mobility, Impaired flexibility, Postural dysfunction, Decreased range of motion  Visit Diagnosis: Muscle weakness (generalized)  Other abnormalities of gait and mobility  Right foot drop  Chronic right-sided low back pain with right-sided sciatica  Other disturbances of skin sensation     Problem List Patient Active Problem List   Diagnosis Date Noted  . 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 Right thigh pain 01/17/2017  . Right foot drop 12/23/2016  . Chronic right-sided low back pain with right-sided sciatica 12/23/2016  . Pedal edema 10/07/2016  . Endometrial cancer ([place]) 08/05/2016  . Superficial postoperative wound infection 07/22/2016  . 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 Diabetic neuropathy ([place]) 05/27/2016  . Anemia 05/08/2016  . DUB (dysfunctional uterine bleeding) 05/08/2016  . Type 2 diabetes mellitus ([place]) 02/16/2016  . Morbid obesity ([place]) 04/12/2015  . Depression 04/12/2015  . Asthma 03/30/2015  . Hypoxemia 03/30/2015  . Hypothyroidism 03/30/2015  . Thrombocytopenia ([place]) 03/30/2015  . Hypokalemia 03/30/2015     PHYSICAL THERAPY DISCHARGE SUMMARY  Visits from Start of Care: 19   Current functional level related to goals / functional outcomes: See above     Remaining deficits: See above    Education / Equipment: HEP, AFO don/doff, falls risk reduction, stair training with AFO, AFO gait training, Leg length discrepancy assessment and correction with heel wedge, walking plan, benefits of increased activity.  Plan: Patient agrees to discharge.  Patient goals were met. Patient is being discharged due to meeting the stated rehab goals.  ?????          Waunita Schooner SPT 05/22/2017, 4:20 PM  [facility] [address] [place], [place], [address] Phone: [phone]   Fax:  [phone]  Name: Janet [name] MRN:  [id number] Date of Birth: 06-08-1970

## 2017-05-23 ENCOUNTER — Ambulatory Visit: Payer: Medicaid Other

## 2017-05-26 ENCOUNTER — Encounter: Payer: Self-pay | Admitting: Family Medicine

## 2017-05-26 ENCOUNTER — Ambulatory Visit: Payer: Medicaid Other | Attending: Family Medicine | Admitting: Family Medicine

## 2017-05-26 VITALS — BP 161/76 | HR 75 | Temp 98.4°F | Ht 69.0 in | Wt 382.0 lb

## 2017-05-26 DIAGNOSIS — F330 Major depressive disorder, recurrent, mild: Secondary | ICD-10-CM | POA: Insufficient documentation

## 2017-05-26 DIAGNOSIS — R161 Splenomegaly, not elsewhere classified: Secondary | ICD-10-CM | POA: Insufficient documentation

## 2017-05-26 DIAGNOSIS — Z91013 Allergy to seafood: Secondary | ICD-10-CM | POA: Diagnosis not present

## 2017-05-26 DIAGNOSIS — K5900 Constipation, unspecified: Secondary | ICD-10-CM | POA: Insufficient documentation

## 2017-05-26 DIAGNOSIS — E039 Hypothyroidism, unspecified: Secondary | ICD-10-CM | POA: Diagnosis not present

## 2017-05-26 DIAGNOSIS — C541 Malignant neoplasm of endometrium: Secondary | ICD-10-CM

## 2017-05-26 DIAGNOSIS — I10 Essential (primary) hypertension: Secondary | ICD-10-CM | POA: Diagnosis not present

## 2017-05-26 DIAGNOSIS — Z791 Long term (current) use of non-steroidal anti-inflammatories (NSAID): Secondary | ICD-10-CM | POA: Diagnosis not present

## 2017-05-26 DIAGNOSIS — M21371 Foot drop, right foot: Secondary | ICD-10-CM | POA: Diagnosis not present

## 2017-05-26 DIAGNOSIS — E1141 Type 2 diabetes mellitus with diabetic mononeuropathy: Secondary | ICD-10-CM | POA: Diagnosis present

## 2017-05-26 DIAGNOSIS — Z90710 Acquired absence of both cervix and uterus: Secondary | ICD-10-CM | POA: Insufficient documentation

## 2017-05-26 DIAGNOSIS — K7469 Other cirrhosis of liver: Secondary | ICD-10-CM

## 2017-05-26 DIAGNOSIS — Z79899 Other long term (current) drug therapy: Secondary | ICD-10-CM | POA: Diagnosis not present

## 2017-05-26 DIAGNOSIS — Z9889 Other specified postprocedural states: Secondary | ICD-10-CM | POA: Insufficient documentation

## 2017-05-26 DIAGNOSIS — J4540 Moderate persistent asthma, uncomplicated: Secondary | ICD-10-CM | POA: Diagnosis not present

## 2017-05-26 DIAGNOSIS — K5909 Other constipation: Secondary | ICD-10-CM | POA: Diagnosis not present

## 2017-05-26 DIAGNOSIS — E118 Type 2 diabetes mellitus with unspecified complications: Secondary | ICD-10-CM

## 2017-05-26 DIAGNOSIS — Z8542 Personal history of malignant neoplasm of other parts of uterus: Secondary | ICD-10-CM | POA: Diagnosis not present

## 2017-05-26 DIAGNOSIS — K7460 Unspecified cirrhosis of liver: Secondary | ICD-10-CM | POA: Insufficient documentation

## 2017-05-26 DIAGNOSIS — Z9049 Acquired absence of other specified parts of digestive tract: Secondary | ICD-10-CM | POA: Diagnosis not present

## 2017-05-26 DIAGNOSIS — G5701 Lesion of sciatic nerve, right lower limb: Secondary | ICD-10-CM | POA: Diagnosis not present

## 2017-05-26 DIAGNOSIS — G5791 Unspecified mononeuropathy of right lower limb: Secondary | ICD-10-CM | POA: Insufficient documentation

## 2017-05-26 DIAGNOSIS — G5730 Lesion of lateral popliteal nerve, unspecified lower limb: Secondary | ICD-10-CM | POA: Diagnosis not present

## 2017-05-26 DIAGNOSIS — G4730 Sleep apnea, unspecified: Secondary | ICD-10-CM | POA: Diagnosis not present

## 2017-05-26 DIAGNOSIS — K766 Portal hypertension: Secondary | ICD-10-CM | POA: Insufficient documentation

## 2017-05-26 DIAGNOSIS — E1149 Type 2 diabetes mellitus with other diabetic neurological complication: Secondary | ICD-10-CM | POA: Diagnosis not present

## 2017-05-26 DIAGNOSIS — Z881 Allergy status to other antibiotic agents status: Secondary | ICD-10-CM | POA: Diagnosis not present

## 2017-05-26 LAB — POCT GLYCOSYLATED HEMOGLOBIN (HGB A1C): HEMOGLOBIN A1C: 6.7

## 2017-05-26 LAB — GLUCOSE, POCT (MANUAL RESULT ENTRY): POC Glucose: 121 mg/dl — AB (ref 70–99)

## 2017-05-26 MED ORDER — LEVOTHYROXINE SODIUM 150 MCG PO TABS: 150.0000 ug | ORAL_TABLET | Freq: Every day | ORAL | 1 refills | Status: DC

## 2017-05-26 MED ORDER — DULOXETINE HCL 60 MG PO CPEP: 60.0000 mg | ORAL_CAPSULE | Freq: Every day | ORAL | 1 refills | Status: DC

## 2017-05-26 MED ORDER — PREGABALIN 75 MG PO CAPS: 75.0000 mg | ORAL_CAPSULE | Freq: Two times a day (BID) | ORAL | 3 refills | Status: DC

## 2017-05-26 MED ORDER — MONTELUKAST SODIUM 10 MG PO TABS: 10.0000 mg | ORAL_TABLET | Freq: Every day | ORAL | 1 refills | Status: DC

## 2017-05-26 MED ORDER — LISINOPRIL-HYDROCHLOROTHIAZIDE 10-12.5 MG PO TABS
1.0000 | ORAL_TABLET | Freq: Every day | ORAL | 1 refills | Status: DC
Start: 2017-05-26 — End: 2017-09-17

## 2017-05-26 MED ORDER — ALBUTEROL SULFATE HFA 108 (90 BASE) MCG/ACT IN AERS: 1.0000 | INHALATION_SPRAY | Freq: Four times a day (QID) | RESPIRATORY_TRACT | 3 refills | Status: DC | PRN

## 2017-05-26 MED ORDER — FLUTICASONE-SALMETEROL 250-50 MCG/DOSE IN AEPB: 1.0000 | INHALATION_SPRAY | Freq: Two times a day (BID) | RESPIRATORY_TRACT | 1 refills | Status: DC

## 2017-05-26 NOTE — Progress Notes (Signed)
Subjective:  Patient ID: Janet [name], female    DOB: 1970/11/25  Age: 47 y.o. MRN: [id number]  CC: Diabetes   HPI Janet [name]  is a 47 year old female with a history of type 2 diabetes mellitus (diet controlled with A1c of 6.7), Diabetic neuropathy, hypothyroidism, asthma, Seasonal allergies, endometrial adenocarcinoma (s/pTotal abdominal hysterectomy and bilateral salpingo-oophorectomy in 06/2016, adjuvant treatment due to low risk for recurrence),  morbid obesity who presents today for follow-up visit.  She recently completed physical therapy due to right foot drop as a result of right common peroneal nerve neuropathy and reports minimal improvement in symptoms.  Currently being seen by neurology and she has an AFO brace which she wears intermittently. Her appointment with GYN oncology comes up next month for follow-up due to her history of endometrial carcinoma.  With regards to her diabetes this is diet controlled and she denies hypoglycemia; does have neuropathy which is uncontrolled on gabapentin.  Lyrica was previously prescribed but this was never approved by insurance. She is not up-to-date on annual eye exam on her appointment comes up tomorrow. She is requesting referral to bariatric surgeon to help with weight loss S exercise and dietary modifications have not helped.  Her depression has been stable but it is nearing the 1 year anniversary of her husband's passing and she is beginning to feel some sadness.  Reviewed imaging studies with the patient: CT abdomen and pelvis with contrast revealed findings in keeping with cirrhosis from 11/2016.  Denies history of alcohol consumption. IMPRESSION: 1. Interval increase in size of a now mildly enlarged retroperitoneal lymph node in the aortocaval space. Close attention in this region on follow-up recommended as metastatic disease is a concern. 2. Cirrhotic changes in the liver with evidence of portal venous hypertension  (recanalization of paraumbilical vein and Splenomegaly).   ADDENDUM REPORT: 04/12/2017 16:04  ADDENDUM: At the time of original interpretation, this study was also compared with PET-CT dated 12/04/2016.  The aortocaval lymph node is also stable when compared to PET-CT 12/04/2016. Past Medical History:  Diagnosis Date  . Anemia   . Asthma   . Depression   . Hypothyroidism   . Pre-diabetes   . Right foot drop   . Sleep apnea   . Thyroid disease   . Uterine cancer [facility])     Past Surgical History:  Procedure Laterality Date  . CARPAL TUNNEL RELEASE    . CESAREAN SECTION    . CHOLECYSTECTOMY    . cyst removal     uterus  . DILATION AND CURETTAGE OF UTERUS     still birth  . DILATION AND CURETTAGE OF UTERUS N/A 06/11/2016   Procedure: DILATATION AND CURETTAGE;  Surgeon: Emily Filbert, MD;  Location: [facility];  Service: Gynecology;  Laterality: N/A;  . ROBOTIC ASSISTED TOTAL HYSTERECTOMY WITH BILATERAL SALPINGO OOPHERECTOMY Bilateral 07/09/2016   Procedure: XI ROBOTIC ASSISTED TOTAL HYSTERECTOMY WITH BILATERAL SALPINGO OOPHORECTOMY FOR UTERUS GREATER THAN 250 GRAMS, LYSIS OF ADHESIONS;  Surgeon: Everitt Amber, MD;  Location: [facility];  Service: Gynecology;  Laterality: Bilateral;  . SINUS EXPLORATION    . WISDOM TOOTH EXTRACTION      Allergies  Allergen Reactions  . Ciprofloxacin Shortness Of Breath  . Shrimp [Shellfish Allergy]      Outpatient Medications Prior to Visit  Medication Sig Dispense Refill  . albuterol (PROVENTIL) (2.5 MG/3ML) 0.083% nebulizer solution Take 3 mLs (2.5 mg total) by nebulization every 6 (six) hours as needed for wheezing or shortness of breath. 75  mL 3  . cetirizine (ZYRTEC) 10 MG tablet Take 10 mg by mouth daily.    Marland Kitchen ibuprofen (ADVIL,MOTRIN) 600 MG tablet Take 1 tablet (600 mg total) by mouth every 6 (six) hours as needed. 30 tablet 1  . Lidocaine 4 % GEL Apply to right toe as needed. 10 g 11  . meloxicam (MOBIC) 15 MG tablet Take 1 tablet (15 mg  total) by mouth daily. 30 tablet 6  . OXcarbazepine (TRILEPTAL) 150 MG tablet Take 1 tablet (150 mg total) by mouth 2 (two) times daily. 180 tablet 4  . albuterol (PROVENTIL HFA;VENTOLIN HFA) 108 (90 Base) MCG/ACT inhaler Inhale 1-2 puffs into the lungs every 6 (six) hours as needed for wheezing or shortness of breath. 54 g 3  . DULoxetine (CYMBALTA) 60 MG capsule Take 1 capsule (60 mg total) by mouth daily. 30 capsule 3  . Fluticasone-Salmeterol (ADVAIR DISKUS) 250-50 MCG/DOSE AEPB Inhale 1 puff into the lungs 2 (two) times daily. 60 each 3  . levothyroxine (SYNTHROID, LEVOTHROID) 150 MCG tablet Take 1 tablet (150 mcg total) by mouth daily before breakfast. 30 tablet 3  . montelukast (SINGULAIR) 10 MG tablet Take 1 tablet (10 mg total) by mouth at bedtime. 90 tablet 1   No facility-administered medications prior to visit.     ROS Review of Systems  Constitutional: Negative for activity change, appetite change and fatigue.  HENT: Negative for congestion, sinus pressure and sore throat.   Eyes: Negative for visual disturbance.  Respiratory: Negative for cough, chest tightness, shortness of breath and wheezing.   Cardiovascular: Negative for chest pain and palpitations.  Gastrointestinal: Negative for abdominal distention, abdominal pain and constipation.  Endocrine: Negative for polydipsia.  Genitourinary: Negative for dysuria and frequency.  Musculoskeletal: Positive for back pain. Negative for arthralgias.  Skin: Negative for rash.  Neurological: Positive for weakness. Negative for tremors, light-headedness and numbness.  Hematological: Does not bruise/bleed easily.  Psychiatric/Behavioral: Negative for agitation and behavioral problems.    Objective:  BP (!) 161/76   Pulse 75   Temp 98.4 F (36.9 C) (Oral)   Ht '5\' 9"'  (1.753 m)   Wt (!) 382 lb (173.3 kg)   LMP  (LMP Unknown) Comment: irregular periods previously, bleeding started in january  SpO2 97%   BMI 56.41 kg/m    BP/Weight 05/26/2017 07/17/4401 05/18/4257  Systolic BP 563 875 643  Diastolic BP 76 77 79  Wt. (Lbs) 382 379 381.8  BMI 56.41 55.97 56.38      Physical Exam  Constitutional: She is oriented to person, place, and time. She appears well-developed and well-nourished.  Cardiovascular: Normal rate, normal heart sounds and intact distal pulses.  No murmur heard. Pulmonary/Chest: Effort normal and breath sounds normal. She has no wheezes. She has no rales. She exhibits no tenderness.  Abdominal: Soft. Bowel sounds are normal. She exhibits no distension and no mass. There is no tenderness.  Musculoskeletal: Normal range of motion.  Neurological: She is alert and oriented to person, place, and time.  Reduced dorsiflexion of right foot; left is normal  Skin: Skin is warm and dry.  Psychiatric: She has a normal mood and affect.     CMP Latest Ref Rng & Units 11/21/2016 11/18/2016 10/07/2016  Glucose 65 - 99 mg/dL - 105(H) 96  BUN 6 - 24 mg/dL - 7 7  Creatinine 0.44 - 1.00 mg/dL 0.70 0.69 0.67  Sodium 134 - 144 mmol/L - 141 142  Potassium 3.5 - 5.2 mmol/L - 4.1 3.8  Chloride 96 - 106 mmol/L - 104 102  CO2 20 - 29 mmol/L - 28 24  Calcium 8.7 - 10.2 mg/dL - 9.0 9.2  Total Protein 6.0 - 8.5 g/dL - 6.8 -  Total Bilirubin 0.0 - 1.2 mg/dL - 0.6 -  Alkaline Phos 39 - 117 IU/L - 106 -  AST 0 - 40 IU/L - 23 -  ALT 0 - 32 IU/L - 14 -    Lipid Panel     Component Value Date/Time   CHOL 131 11/18/2016 0940   TRIG 71 11/18/2016 0940   HDL 67 11/18/2016 0940   CHOLHDL 2.0 11/18/2016 0940   CHOLHDL 2.1 12/26/2015 0929   VLDL 15 12/26/2015 0929   LDLCALC 50 11/18/2016 0940    Lab Results  Component Value Date   HGBA1C 6.7 05/26/2017    Assessment & Plan:   1. Type 2 diabetes mellitus with other neurologic complication, without long-term current use of insulin (HCC) Controlled with A1c of 6.7 Continue diet controlled Lyrica added as neuropathy is uncontrolled on gabapentin - POCT  glucose (manual entry) - POCT glycosylated hemoglobin (Hb A1C) - CMP14+EGFR - pregabalin (LYRICA) 75 MG capsule; Take 1 capsule (75 mg total) by mouth 2 (two) times daily.  Dispense: 60 capsule; Refill: 3 - Microalbumin/Creatinine Ratio, Urine  2. Moderate persistent asthma without complication Stable No recent exacerbation - montelukast (SINGULAIR) 10 MG tablet; Take 1 tablet (10 mg total) by mouth at bedtime.  Dispense: 90 tablet; Refill: 1 - Fluticasone-Salmeterol (ADVAIR DISKUS) 250-50 MCG/DOSE AEPB; Inhale 1 puff into the lungs 2 (two) times daily.  Dispense: 180 each; Refill: 1 - albuterol (PROVENTIL HFA;VENTOLIN HFA) 108 (90 Base) MCG/ACT inhaler; Inhale 1-2 puffs into the lungs every 6 (six) hours as needed for wheezing or shortness of breath.  Dispense: 54 g; Refill: 3  3. Hypothyroidism, unspecified type Controlled - TSH - levothyroxine (SYNTHROID, LEVOTHROID) 150 MCG tablet; Take 1 tablet (150 mcg total) by mouth daily before breakfast.  Dispense: 90 tablet; Refill: 1  4. Mild episode of recurrent major depressive disorder ([place]) She is experiencing sadness as it is nearing the 1 year anniversary of her husband's passing Discussed coping mechanism and she has family support - DULoxetine (CYMBALTA) 60 MG capsule; Take 1 capsule (60 mg total) by mouth daily.  Dispense: 90 capsule; Refill: 1  5. Morbid obesity ([place]) - Amb Referral to Bariatric Surgery  6. Endometrial cancer ([place]) s/pTotal abdominal hysterectomy and bilateral salpingo-oophorectomy in 06/2016, adjuvant treatment due to low risk for recurrence),   7. 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 Essential hypertension Newly diagnosed Commence lisinopril/HCTZ - lisinopril-hydrochlorothiazide (PRINZIDE,ZESTORETIC) 10-12.5 MG tablet; Take 1 tablet by mouth daily.  Dispense: 90 tablet;  Refill: 1  9. Other constipation Advised to increase fiber intake Use OTC MiraLAX   Meds ordered this encounter  Medications  . lisinopril-hydrochlorothiazide (PRINZIDE,ZESTORETIC) 10-12.5 MG tablet    Sig: Take 1 tablet by mouth daily.    Dispense:  90 tablet    Refill:  1  . pregabalin (LYRICA) 75 MG capsule    Sig: Take 1 capsule (75 mg total) by mouth 2 (two) times daily.    Dispense:  60 capsule    Refill:  3  . montelukast (SINGULAIR) 10 MG tablet    Sig: Take 1 tablet (10 mg total) by mouth at bedtime.    Dispense:  90 tablet  Refill:  1  . levothyroxine (SYNTHROID, LEVOTHROID) 150 MCG tablet    Sig: Take 1 tablet (150 mcg total) by mouth daily before breakfast.    Dispense:  90 tablet    Refill:  1  . Fluticasone-Salmeterol (ADVAIR DISKUS) 250-50 MCG/DOSE AEPB    Sig: Inhale 1 puff into the lungs 2 (two) times daily.    Dispense:  180 each    Refill:  1  . DULoxetine (CYMBALTA) 60 MG capsule    Sig: Take 1 capsule (60 mg total) by mouth daily.    Dispense:  90 capsule    Refill:  1  . albuterol (PROVENTIL HFA;VENTOLIN HFA) 108 (90 Base) MCG/ACT inhaler    Sig: Inhale 1-2 puffs into the lungs every 6 (six) hours as needed for wheezing or shortness of breath.    Dispense:  54 g    Refill:  3    Follow-up: Return in about 3 months (around 08/25/2017) for Follow-up of chronic medical conditions.   Charlott Rakes MD

## 2017-05-26 NOTE — Patient Instructions (Signed)

## 2017-05-27 ENCOUNTER — Encounter: Payer: Self-pay | Admitting: Family Medicine

## 2017-05-27 DIAGNOSIS — K7460 Unspecified cirrhosis of liver: Secondary | ICD-10-CM | POA: Insufficient documentation

## 2017-05-27 LAB — CMP14+EGFR
ALBUMIN: 4.4 g/dL (ref 3.5–5.5)
ALT: 19 IU/L (ref 0–32)
AST: 35 IU/L (ref 0–40)
Albumin/Globulin Ratio: 1.6 (ref 1.2–2.2)
Alkaline Phosphatase: 138 IU/L — ABNORMAL HIGH (ref 39–117)
BUN / CREAT RATIO: 9 (ref 9–23)
BUN: 7 mg/dL (ref 6–24)
Bilirubin Total: 0.5 mg/dL (ref 0.0–1.2)
CO2: 24 mmol/L (ref 20–29)
Calcium: 9 mg/dL (ref 8.7–10.2)
Chloride: 104 mmol/L (ref 96–106)
Creatinine, Ser: 0.77 mg/dL (ref 0.57–1.00)
GFR calc non Af Amer: 92 mL/min/{1.73_m2} (ref >59)
GFR, EST AFRICAN AMERICAN: 106 mL/min/{1.73_m2} (ref >59)
GLOBULIN, TOTAL: 2.8 g/dL (ref 1.5–4.5)
GLUCOSE: 103 mg/dL — AB (ref 65–99)
Potassium: 3.9 mmol/L (ref 3.5–5.2)
SODIUM: 143 mmol/L (ref 134–144)
TOTAL PROTEIN: 7.2 g/dL (ref 6.0–8.5)

## 2017-05-27 LAB — MICROALBUMIN / CREATININE URINE RATIO
Creatinine, Urine: 64.7 mg/dL
Microalb/Creat Ratio: 10.7 mg/g creat (ref 0.0–30.0)
Microalbumin, Urine: 6.9 ug/mL

## 2017-05-27 LAB — TSH: TSH: 4.92 u[IU]/mL — ABNORMAL HIGH (ref 0.450–4.500)

## 2017-05-27 MED ORDER — LEVOTHYROXINE SODIUM 175 MCG PO TABS: 175.0000 ug | ORAL_TABLET | Freq: Every day | ORAL | 1 refills | Status: DC

## 2017-06-20 ENCOUNTER — Inpatient Hospital Stay: Payer: Medicaid Other | Attending: Gynecologic Oncology | Admitting: Gynecologic Oncology

## 2017-06-20 ENCOUNTER — Encounter: Payer: Self-pay | Admitting: Gynecologic Oncology

## 2017-06-20 VITALS — BP 137/72 | HR 70 | Temp 98.3°F | Resp 20 | Ht 69.0 in | Wt 389.5 lb

## 2017-06-20 DIAGNOSIS — Z90722 Acquired absence of ovaries, bilateral: Secondary | ICD-10-CM | POA: Diagnosis not present

## 2017-06-20 DIAGNOSIS — Z90710 Acquired absence of both cervix and uterus: Secondary | ICD-10-CM

## 2017-06-20 DIAGNOSIS — R600 Localized edema: Secondary | ICD-10-CM

## 2017-06-20 DIAGNOSIS — R599 Enlarged lymph nodes, unspecified: Secondary | ICD-10-CM

## 2017-06-20 DIAGNOSIS — C541 Malignant neoplasm of endometrium: Secondary | ICD-10-CM

## 2017-06-20 NOTE — Progress Notes (Signed)
Consult Note: Gyn-Onc  Consult was requested by Dr. [name] for the evaluation of Janet [name] 47 y.o. female  CC:  Chief Complaint  Patient presents with  . Endometrial cancer [facility])    Assessment/Plan:  [name] is a 47 y.o.  with stage IA grade 2 endometrial cancer s/p robotic hysterectomy, BSO on 0/62/69 complicated by post op wound separation.  Endometrial cancer: Pathology revealed low risk factors for recurrence, therefore no adjuvant therapy is recommended according to NCCN guidelines.  I discussed risk for recurrence and typical symptoms encouraged her to notify us of these should they develop between visits.  I recommend she have follow-up every 6 months for 5 years in accordance with NCCN guidelines. Those visits should include symptom assessment, physical exam and pelvic examination. Pap smears are not indicated or recommended in the routine surveillance of endometrial cancer.  Enlarged aortocaval nodes - repeat CT imaging in November to re-evaluate for progression. Will need creatinine assessed closer to date.   New left lower extremity edema - recommend US doppler to evaluate for DVT. If negative and progressive edema develops, would  Recommend moving up CT scan to rule out pelvic recurrence as source of venous obstruction.   HPI: [name] is a 47 y.o.  G2P1 with history of irregular menses.  Janet [name] presented To Dr. [name] in  with a report of bleeding daily for almost a month. Janet [name] was unable to tolerate an office examination and thus she underwent hysteroscopy D&C on 06/11/2016.  Pathology was notable for complex atypical hyperplasia with a few small foci suspicious for early/microscopic adenocarcinoma. She presented to the emergency room on May 6 and was diagnosed with endometritis. She's currently receiving course of Flagyl which will be completed on 06/23/2016.  There is no family history of gynecologic GI or breast  malignancies.    On 07/09/16 she underwent robotic assisted total hysterectomy, BSO for a uterus >250 (336)gm with minilaparotomy for specimen delivery.  Final pathology revealed a 2.4cm grade 2 endometrioid tumor with no myometrial invasion or LVSI. Lymphadenectomy was not performed due to extreme abdominal adiposity (BMI 56) and preoperative preinvasive diagnosis. She was determined to have low risk features in her pathology and therefore no adjuvant therapy was recommended in accordance with NCCN guidelines.  Postop week 1 her minilaparotomy wound opened (no cellulitis) and closed via secondary intention.  On post-op imaging (CT) on 11/21/16 (due to inability to stage because of obesity) she had findings of Interval increase in size of a now mildly enlarged retroperitoneal lymph node in the aortocaval space. Close attention in this region on follow-up recommended as metastatic disease is a concern. 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  PET/CT was ordered and performed on 12/04/16 which showed aortocaval adenopathy measuring 1.5cm. Non-PET avid. The images were reviewed at tumor board and felt to not be likely metastatic disease therefore observation and repeat imaging was recommended.  Interval Hx:  Repeat CT scan on 04/10/17 showed stabel PA nodes (1.6cm largest).  She developed new onset left lower extremity edema in May, 2019. No other symptoms of recurrence.   Review of Systems:  Constitutional  Feels well,   Cardiovascular  No chest pain, shortness of breath, or edema  Pulmonary  No cough or wheeze.  Gastro Intestinal  No nausea, vomitting, or diarrhoea. No bright red blood per rectum, no abdominal pain, no change in bowel movement, or constipation.  Genito Urinary  No frequency, urgency, dysuria, vaginal spotting Musculo Skeletal  No myalgia, arthralgia, joint swelling or pain   Neurologic  No weakness, numbness, change in gait,  Psychology  No depression, anxiety from diagnoses and social situation  Current Meds:  Outpatient Encounter Medications as of 06/20/2017  Medication Sig  . albuterol (PROVENTIL HFA;VENTOLIN HFA) 108 (90 Base) MCG/ACT inhaler Inhale 1-2 puffs into the lungs every 6 (six) hours as needed for wheezing or shortness of breath.  Marland Kitchen albuterol (PROVENTIL) (2.5 MG/3ML) 0.083% nebulizer solution Take 3 mLs (2.5 mg total) by nebulization every 6 (six) hours as needed for wheezing or shortness of breath.  . cetirizine (ZYRTEC) 10 MG tablet Take 10 mg by mouth daily.  . DULoxetine (CYMBALTA) 60 MG capsule Take 1 capsule (60 mg total) by mouth daily.  . Fluticasone-Salmeterol (ADVAIR DISKUS) 250-50 MCG/DOSE AEPB Inhale 1 puff into the lungs 2 (two) times daily.  Marland Kitchen ibuprofen (ADVIL,MOTRIN) 600 MG tablet Take 1 tablet (600 mg total) by mouth every 6 (six) hours as needed.  Marland Kitchen levothyroxine (SYNTHROID, LEVOTHROID) 175 MCG tablet Take 1 tablet (175 mcg total) by mouth daily before breakfast.  . Lidocaine 4 % GEL Apply to right toe as needed.  Marland Kitchen lisinopril-hydrochlorothiazide (PRINZIDE,ZESTORETIC) 10-12.5 MG tablet Take 1 tablet by mouth daily.  . meloxicam (MOBIC) 15 MG tablet Take 1 tablet (15 mg total) by mouth daily.  . montelukast (SINGULAIR) 10 MG tablet Take 1 tablet (10 mg total) by mouth at bedtime.  . OXcarbazepine (TRILEPTAL) 150 MG tablet Take 1 tablet (150 mg total) by mouth 2 (two) times daily.  . pregabalin (LYRICA) 75 MG capsule Take 1 capsule (75 mg total) by mouth 2 (two) times daily.   No facility-administered encounter medications on file as of 06/20/2017.     Allergy:  Allergies  Allergen Reactions  . Ciprofloxacin Shortness Of Breath  . Shrimp [Shellfish Allergy]     Social Hx:   Social History   Socioeconomic History  . Marital status: Widowed    Spouse name: Not on file  . Number of children: 1  . Years of education:  college  . Highest education level: Not on file  Occupational History  . Occupation: Unemployed  Social Needs  . Financial resource strain: Not on file  . Food insecurity:    Worry: Not on file    Inability: Not on file  . Transportation needs:    Medical: Not on file    Non-medical: Not on file  Tobacco Use  . Smoking status: Never Smoker  . Smokeless tobacco: Never Used  Substance and Sexual Activity  . Alcohol use: No  . Drug use: No  . Sexual activity: Not Currently    Birth control/protection: None  Lifestyle  . Physical activity:    Days per week: Not on file    Minutes per session: Not on file  . Stress: Not on file  Relationships  . Social connections:    Talks on phone: Not on file    Gets together: Not on file    Attends religious service: Not on file    Active member of club or organization: Not on file    Attends meetings of clubs or organizations: Not on file    Relationship status: Not on file  . Intimate partner violence:    Fear of current or ex partner: Not on file    Emotionally abused: Not on file    Physically abused: Not on file    Forced sexual activity:  Not on file  Other Topics Concern  . Not on file  Social History Narrative   Lives at home with her son.   Right-handed.   2 cups caffeine per day.   Husband was diagnosed with leukemia in January 2018 with a life expectancy of approximately 6 months. At this time he is receiving supportive care and regularly is managed with blood and platelet transfusions.  Regular home nursing visits are in place.   Janet [name] denies history of sexual or physical abuse.  Past Surgical Hx:  Past Surgical History:  Procedure Laterality Date  . CARPAL TUNNEL RELEASE    . CESAREAN SECTION    . CHOLECYSTECTOMY    . cyst removal     uterus  . DILATION AND CURETTAGE OF UTERUS     still birth  . DILATION AND CURETTAGE OF UTERUS N/A 06/11/2016   Procedure: DILATATION AND CURETTAGE;  Surgeon: Emily Filbert,  MD;  Location: [facility];  Service: Gynecology;  Laterality: N/A;  . ROBOTIC ASSISTED TOTAL HYSTERECTOMY WITH BILATERAL SALPINGO OOPHERECTOMY Bilateral 07/09/2016   Procedure: XI ROBOTIC ASSISTED TOTAL HYSTERECTOMY WITH BILATERAL SALPINGO OOPHORECTOMY FOR UTERUS GREATER THAN 250 GRAMS, LYSIS OF ADHESIONS;  Surgeon: Everitt Amber, MD;  Location: [facility];  Service: Gynecology;  Laterality: Bilateral;  . SINUS EXPLORATION    . WISDOM TOOTH EXTRACTION      Past Medical Hx:  Past Medical History:  Diagnosis Date  . Anemia   . Asthma   . Depression   . Hypothyroidism   . Pre-diabetes   . Right foot drop   . Sleep apnea   . Thyroid disease   . Uterine cancer [facility])     Past Gynecological History:  Gravida 2 para 1 menarche at 86 with irregular menses since last Pap 06/11/2016 within normal limits no history of abnormal Pap test reports history of OCP use in her 19s  Family Hx:  Family History  Problem Relation Age of Onset  . Heart attack Mother   . Suicidality Father     Vitals:  Blood pressure 137/72, pulse 70, temperature 98.3 F (36.8 C), temperature source Oral, resp. rate 20, height 5\' 9"  (1.753 m), weight (!) 389 lb 8 oz (176.7 kg), SpO2 98 %. Body mass index is 57.52 kg/m. Wt Readings from Last 3 Encounters:  06/20/17 (!) 389 lb 8 oz (176.7 kg)  05/26/17 (!) 382 lb (173.3 kg)  04/17/17 (!) 379 lb (171.9 kg)    Physical Exam: WD in NAD Neck  Supple NROM, without any enlargements.  Lymph Node Survey No cervical supraclavicular or inguinal adenopathy Cardiovascular  Pulse normal rate, regularity and rhythm. S1 and S2 normal.  Lungs  Clear to auscultation bilaterally, without wheezes/crackles/rhonchi. Good air movement.  Skin  No rash/lesions/breakdown  Psychiatry  Alert and oriented appropriate mood affect speech and reasoning. Abdomen  Normoactive bowel sounds, abdomen soft, non-tender. large pannus, incision closed and no evidence of hernia Back No CVA  tenderness Genito Urinary  : normal vaginal cuff - no palpable masses or cuff lesions. Rectal  deferred  Extremities  + left lower extremity 2+, no tenderness.  Janet Solo, MD, PhD 06/20/2017, 3:32 PM

## 2017-06-20 NOTE — Patient Instructions (Addendum)
Dr Denman George has scheduled you for an ultrasound of your leg to rule out blood clot. If this is negative but your swelling becomes progressively worse, notify Dr Serita Grit office. THIS WILL BE AT [place] ON Monday AT 9AM.  ARRIVE AT 8:45AM TO ADMITTING.  Otherwise, return in November for follow-up with Dr Denman George.  Dr Denman George will schedule a CT scan prior to that visit to monitor the enlarged lymph nodes.   Please notify Dr Denman George at phone number [phone] if you notice vaginal bleeding, new pelvic or abdominal pains, bloating, feeling full easy, or a change in bladder or bowel function.

## 2017-06-23 ENCOUNTER — Ambulatory Visit (HOSPITAL_COMMUNITY)
Admission: RE | Admit: 2017-06-23 | Discharge: 2017-06-23 | Disposition: A | Discharge status: Home / Self Care | Payer: Medicaid Other | Source: Ambulatory Visit | Attending: Gynecologic Oncology | Admitting: Gynecologic Oncology

## 2017-06-23 ENCOUNTER — Telehealth: Payer: Self-pay

## 2017-06-23 DIAGNOSIS — Z8542 Personal history of malignant neoplasm of other parts of uterus: Secondary | ICD-10-CM | POA: Diagnosis not present

## 2017-06-23 DIAGNOSIS — R600 Localized edema: Secondary | ICD-10-CM

## 2017-06-23 NOTE — Telephone Encounter (Signed)
Janet [name] states  That Ms Truelov's Doppler is negative for DVT. Reviewed with Joylene John, NP.  Pt may go home.

## 2017-06-23 NOTE — Progress Notes (Signed)
VASCULAR LAB PRELIMINARY  PRELIMINARY  PRELIMINARY  PRELIMINARY  Left lower extremity venous duplex completed.    Preliminary report:  There is no obvious evidence of DVT or SVT noted in the visualized veins of the left lower extremity.  Called report to [place], RVT 06/23/2017, 9:26 AM

## 2017-07-03 ENCOUNTER — Encounter: Payer: Self-pay | Admitting: Family Medicine

## 2017-07-03 ENCOUNTER — Telehealth: Payer: Self-pay | Admitting: *Deleted

## 2017-07-03 NOTE — Telephone Encounter (Signed)
Will route to PCP 

## 2017-07-03 NOTE — Telephone Encounter (Signed)
Patient verified DOB Patient states she has had bilateral edema in her feet for the past week. Patient would like to be advised and will upload photos mychart.

## 2017-07-04 MED ORDER — FUROSEMIDE 20 MG PO TABS: 20.0000 mg | ORAL_TABLET | Freq: Every day | ORAL | 0 refills | Status: DC

## 2017-07-04 NOTE — Addendum Note (Signed)
Addended byCharlott Rakes on: 07/04/2017 03:02 PM   Modules accepted: Orders

## 2017-07-04 NOTE — Telephone Encounter (Signed)
Please advise to elevate feet, use compression stockings.  I have sent a prescription for Lasix to her pharmacy and she will need an office visit for further evaluation.

## 2017-07-09 ENCOUNTER — Other Ambulatory Visit: Payer: Self-pay | Admitting: Family Medicine

## 2017-07-09 ENCOUNTER — Encounter: Payer: Self-pay | Admitting: Family Medicine

## 2017-07-09 DIAGNOSIS — R600 Localized edema: Secondary | ICD-10-CM

## 2017-07-09 NOTE — Progress Notes (Unsigned)
Could you please schedule an echocardiogram and call this patient with information?  Thank you

## 2017-07-10 ENCOUNTER — Other Ambulatory Visit: Payer: Self-pay | Admitting: Family Medicine

## 2017-07-10 DIAGNOSIS — J4540 Moderate persistent asthma, uncomplicated: Secondary | ICD-10-CM

## 2017-07-10 MED ORDER — FUROSEMIDE 20 MG PO TABS: 20.0000 mg | ORAL_TABLET | Freq: Every day | ORAL | 1 refills | Status: DC

## 2017-07-10 NOTE — Progress Notes (Signed)
Echo scheduled for 07/22/17 at 2:00pm

## 2017-07-21 ENCOUNTER — Ambulatory Visit: Payer: Medicaid Other | Admitting: Family Medicine

## 2017-07-22 ENCOUNTER — Ambulatory Visit (HOSPITAL_COMMUNITY): Admission: RE | Admit: 2017-07-22 | Payer: Medicaid Other | Source: Ambulatory Visit

## 2017-07-22 ENCOUNTER — Telehealth: Payer: Self-pay | Admitting: Family Medicine

## 2017-07-22 NOTE — Telephone Encounter (Signed)
Mary from [facility] called to give a courtesy call that she did not show on her appointment today.

## 2017-07-23 ENCOUNTER — Ambulatory Visit: Payer: Medicaid Other | Attending: Family Medicine | Admitting: Physician Assistant

## 2017-07-23 ENCOUNTER — Ambulatory Visit (HOSPITAL_COMMUNITY)
Admission: RE | Admit: 2017-07-23 | Discharge: 2017-07-23 | Disposition: A | Discharge status: Home / Self Care | Payer: Medicaid Other | Source: Ambulatory Visit | Attending: Physician Assistant | Admitting: Physician Assistant

## 2017-07-23 VITALS — BP 146/77 | HR 85 | Temp 98.5°F | Resp 18 | Ht 69.0 in | Wt 385.0 lb

## 2017-07-23 DIAGNOSIS — F329 Major depressive disorder, single episode, unspecified: Secondary | ICD-10-CM | POA: Insufficient documentation

## 2017-07-23 DIAGNOSIS — Z791 Long term (current) use of non-steroidal anti-inflammatories (NSAID): Secondary | ICD-10-CM | POA: Insufficient documentation

## 2017-07-23 DIAGNOSIS — M25562 Pain in left knee: Secondary | ICD-10-CM

## 2017-07-23 DIAGNOSIS — Z881 Allergy status to other antibiotic agents status: Secondary | ICD-10-CM | POA: Diagnosis not present

## 2017-07-23 DIAGNOSIS — E039 Hypothyroidism, unspecified: Secondary | ICD-10-CM | POA: Diagnosis not present

## 2017-07-23 DIAGNOSIS — E1149 Type 2 diabetes mellitus with other diabetic neurological complication: Secondary | ICD-10-CM | POA: Diagnosis not present

## 2017-07-23 DIAGNOSIS — Z8542 Personal history of malignant neoplasm of other parts of uterus: Secondary | ICD-10-CM | POA: Diagnosis not present

## 2017-07-23 DIAGNOSIS — J45909 Unspecified asthma, uncomplicated: Secondary | ICD-10-CM | POA: Insufficient documentation

## 2017-07-23 DIAGNOSIS — R531 Weakness: Secondary | ICD-10-CM | POA: Insufficient documentation

## 2017-07-23 DIAGNOSIS — G4730 Sleep apnea, unspecified: Secondary | ICD-10-CM | POA: Diagnosis not present

## 2017-07-23 DIAGNOSIS — I10 Essential (primary) hypertension: Secondary | ICD-10-CM | POA: Diagnosis not present

## 2017-07-23 DIAGNOSIS — M238X2 Other internal derangements of left knee: Secondary | ICD-10-CM | POA: Diagnosis not present

## 2017-07-23 DIAGNOSIS — Z79890 Hormone replacement therapy (postmenopausal): Secondary | ICD-10-CM | POA: Diagnosis not present

## 2017-07-23 DIAGNOSIS — Z79899 Other long term (current) drug therapy: Secondary | ICD-10-CM | POA: Insufficient documentation

## 2017-07-23 LAB — GLUCOSE, POCT (MANUAL RESULT ENTRY): POC Glucose: 187 mg/dl — AB (ref 70–99)

## 2017-07-23 MED ORDER — IBUPROFEN 600 MG PO TABS: 600.0000 mg | ORAL_TABLET | Freq: Four times a day (QID) | ORAL | 1 refills | Status: DC | PRN

## 2017-07-23 NOTE — Progress Notes (Signed)
Patient ID: Janet [name], female   DOB: 01-06-1971, 47 y.o.   MRN: [id number]   Janet [name], is a 47 y.o. female  IEP:[id number]  YSA:[id number]  DOB - 18-Feb-1970  Subjective:  Chief Complaint and HPI: Janet [name] is a 47 y.o. female here today for worsening L knee pain and weakness over the last 5-7 days.  NKI.  When she goes to stand up at times, her L knee buckles and feels as though she is going to lose her footing.  Not taking anything for it.    ROS:   Constitutional:  No f/c, No night sweats, No unexplained weight loss. EENT:  No vision changes, No blurry vision, No hearing changes. No mouth, throat, or ear problems.  Respiratory: No cough, No SOB Cardiac: No CP, no palpitations GI:  No abd pain, No N/V/D. GU: No Urinary s/sx Musculoskeletal: + L knee pain Neuro: No headache, no dizziness, no motor weakness.  Skin: No rash Endocrine:  No polydipsia. No polyuria.  Psych: Denies SI/HI  No problems updated.  ALLERGIES: Allergies  Allergen Reactions  . Ciprofloxacin Shortness Of Breath  . Shrimp [Shellfish Allergy]     PAST MEDICAL HISTORY: Past Medical History:  Diagnosis Date  . Anemia   . Asthma   . Depression   . Hypothyroidism   . Pre-diabetes   . Right foot drop   . Sleep apnea   . Thyroid disease   . Uterine cancer [facility])     MEDICATIONS AT HOME: Prior to Admission medications   Medication Sig Start Date End Date Taking? Authorizing Provider  albuterol (PROVENTIL HFA;VENTOLIN HFA) 108 (90 Base) MCG/ACT inhaler Inhale 1-2 puffs into the lungs every 6 (six) hours as needed for wheezing or shortness of breath. 05/26/17  Yes Charlott Rakes, MD  albuterol (PROVENTIL) (2.5 MG/3ML) 0.083% nebulizer solution Take 3 mLs (2.5 mg total) by nebulization every 6 (six) hours as needed for wheezing or shortness of breath. 02/18/17  Yes Charlott Rakes, MD  cetirizine (ZYRTEC) 10 MG tablet Take 10 mg by mouth daily.   Yes [provider]    DULoxetine (CYMBALTA) 60 MG capsule Take 1 capsule (60 mg total) by mouth daily. 05/26/17  Yes Newlin, Charlane Ferretti, MD  Fluticasone-Salmeterol (ADVAIR DISKUS) 250-50 MCG/DOSE AEPB Inhale 1 puff into the lungs 2 (two) times daily. 05/26/17  Yes Charlott Rakes, MD  furosemide (LASIX) 20 MG tablet Take 1 tablet (20 mg total) by mouth daily. 07/10/17  Yes Charlott Rakes, MD  ibuprofen (ADVIL,MOTRIN) 600 MG tablet Take 1 tablet (600 mg total) by mouth every 6 (six) hours as needed. 07/23/17  Yes Argentina Donovan, PA-C  levothyroxine (SYNTHROID, LEVOTHROID) 175 MCG tablet Take 1 tablet (175 mcg total) by mouth daily before breakfast. 05/27/17  Yes Newlin, Enobong, MD  Lidocaine 4 % GEL Apply to right toe as needed. 12/23/16  Yes Marcial Pacas, MD  lisinopril-hydrochlorothiazide (PRINZIDE,ZESTORETIC) 10-12.5 MG tablet Take 1 tablet by mouth daily. 05/26/17  Yes Charlott Rakes, MD  meloxicam (MOBIC) 15 MG tablet Take 1 tablet (15 mg total) by mouth daily. 04/17/17  Yes Marcial Pacas, MD  montelukast (SINGULAIR) 10 MG tablet Take 1 tablet (10 mg total) by mouth at bedtime. 05/26/17  Yes Charlott Rakes, MD  OXcarbazepine (TRILEPTAL) 150 MG tablet Take 1 tablet (150 mg total) by mouth 2 (two) times daily. 04/17/17  Yes Marcial Pacas, MD  pregabalin (LYRICA) 75 MG capsule Take 1 capsule (75 mg total) by mouth 2 (two) times daily. 05/26/17  Yes Newlin,  Enobong, MD     Objective:  EXAM:   Vitals:   07/23/17 1527  BP: (!) 146/77  Pulse: 85  Resp: 18  Temp: 98.5 F (36.9 C)  TempSrc: Oral  SpO2: 98%  Weight: (!) 385 lb (174.6 kg)  Height: 5\' 9"  (1.753 m)    General appearance : A&OX3. NAD. Non-toxic-appearing HEENT: Atraumatic and Normocephalic.  PERRLA. EOM intact.   Neck: supple, no JVD. No cervical lymphadenopathy. No thyromegaly Chest/Lungs:  Breathing-non-labored, Good air entry bilaterally, breath sounds normal without rales, rhonchi, or wheezing  CVS: S1 S2 regular, no murmurs, gallops, rubs  Extremities:  Bilateral Lower Ext shows no edema, both legs are warm to touch with = pulse throughout.  B Knees examined but difficult to get accurate exam due to limited mobility and obesity.  No sign of infection.  There may be a mild effusion on the L knee.   Neurology:  CN II-XII grossly intact, Non focal.   Psych:  TP linear. J/I WNL. Normal speech. Appropriate eye contact and affect.  Skin:  No Rash  Data Review Lab Results  Component Value Date   HGBA1C 6.7 05/26/2017   HGBA1C 6.8 02/18/2017   HGBA1C 6.2 11/18/2016     Assessment & Plan   1. Acute pain of left knee Concern for joint stability.  She is using a cane to ambulate - DG Knee Complete 4 Views Left; Future - ibuprofen (ADVIL,MOTRIN) 600 MG tablet; Take 1 tablet (600 mg total) by mouth every 6 (six) hours as needed.  Dispense: 30 tablet; Refill: 1 - Ambulatory referral to Orthopedic Surgery  2. Type 2 diabetes mellitus with other neurologic complication, without long-term current use of insulin (HCC) Uncontrolled.  Work on diet and take all meds as directed.  - Glucose (CBG)  3. Essential hypertension Not at goal.  DASH diet. Work on diet and take all meds as directed.    Patient have been counseled extensively about nutrition and exercise  Return for keep July appt with Dr Margarita Rana.  The patient was given clear instructions to go to ER or return to medical center if symptoms don't improve, worsen or new problems develop. The patient verbalized understanding. The patient was told to call to get lab results if they haven't heard anything in the next week.     Freeman Caldron, PA-C [facility], [place]   07/23/2017, 3:41 PM

## 2017-07-23 NOTE — Patient Instructions (Signed)

## 2017-07-24 ENCOUNTER — Ambulatory Visit (INDEPENDENT_AMBULATORY_CARE_PROVIDER_SITE_OTHER): Payer: Medicaid Other | Admitting: Surgery

## 2017-07-24 ENCOUNTER — Encounter (INDEPENDENT_AMBULATORY_CARE_PROVIDER_SITE_OTHER): Payer: Self-pay | Admitting: Surgery

## 2017-07-24 VITALS — BP 129/56 | HR 78 | Ht 69.0 in | Wt 385.0 lb

## 2017-07-24 DIAGNOSIS — M25562 Pain in left knee: Secondary | ICD-10-CM | POA: Diagnosis not present

## 2017-07-24 DIAGNOSIS — M1712 Unilateral primary osteoarthritis, left knee: Secondary | ICD-10-CM

## 2017-07-24 NOTE — Progress Notes (Signed)
Office Visit Note   Patient: Janet [name]           Date of Birth: 27-Nov-1970           MRN: [id number] Visit Date: 07/24/2017              Requested by: Argentina Donovan, PA-C [place], [place] [address] PCP: Charlott Rakes, MD   Assessment & Plan: Visit Diagnoses:  1. Acute pain of left knee   2. Arthritis of left knee     Plan: Patient's glucose in the clinic today 142.  Patient states that she self discontinued her diabetes medications.  Advised that if I inject her knee today potential for blood sugars to increase.  She will contact her primary care physician and I encouraged her to get started back on her medications.  She will follow-up with me in 1 week for recheck and I may consider injection at that point.  She can continue Mobic and icing the knee off and on as needed.    Follow-Up Instructions: Return in about 1 week (around 07/31/2017) for with [facility] recheck knee.   Orders:  No orders of the defined types were placed in this encounter.  No orders of the defined types were placed in this encounter.     Procedures: No procedures performed   Clinical Data: No additional findings.   Subjective: Chief Complaint  Patient presents with  . Left Knee - Pain    HPI -year-old white female comes into the office today with complaints of left knee pain.  States that she has had off-and-on knee pain for several years but this is been worse x1 week.  No injury.  She was seen by her primary care physician yesterday and started on Mobic.  She complains of some knee swelling and feeling of buckling.  No lumbar hip or radicular component.  States that she has had problems with chronic right drop foot has gone to extensive physical therapy for this.  She is also followed by [facility] neurology.  Had NCV/EMG study last year and was found to have a right L4-5 radiculopathy.  Patient denies history of gout.  I did review left knee x-rays from yesterday which  show tract acromial degenerative changes.  No acute finding. Review of Systems No current cardiac pulmonary GI GU issues  Objective: Vital Signs: BP (!) 129/56   Pulse 78   Ht 5\' 9"  (1.753 m)   Wt (!) 385 lb (174.6 kg)   LMP  (LMP Unknown) Comment: irregular periods previously, bleeding started in january  BMI 56.85 kg/m   Physical Exam  Constitutional: She is oriented to person, place, and time.  Very pleasant morbidly obese white female alert and oriented in no acute distress.  HENT:  Head: Normocephalic and atraumatic.  Eyes: Pupils are equal, round, and reactive to light. EOM are normal.  Pulmonary/Chest: No respiratory distress.  Musculoskeletal:  Gait is antalgic.  Negative logroll bilateral hips.  Negative straight leg raise.  Right knee range of motion about 0 to 100 degrees with discomfort.  She does have some swelling without significant effusion.  Positive patellofemoral crepitus.  Medial greater than lateral joint line tenderness.  Ligaments are stable.  Calf nontender.  Neurovascular intact.  Neurological: She is alert and oriented to person, place, and time.  Skin: Skin is warm and dry.    Ortho Exam  Specialty Comments:  No specialty comments available.  Imaging: Dg Knee Complete 4 Views Left  Result Date: 07/23/2017 CLINICAL DATA:  Pain and buckling for 1 week. EXAM: LEFT KNEE - COMPLETE 4+ VIEW COMPARISON:  None. FINDINGS: No fracture, dislocation, or joint effusion. Mild tricompartmental degenerative changes. IMPRESSION: Mild tricompartmental degenerative changes. Electronically Signed   By: Dorise Bullion III M.D   On: 07/23/2017 23:34     PMFS History: Patient Active Problem List   Diagnosis Date Noted  . Cirrhosis ([place]) 05/27/2017  . Hypertension 05/26/2017  . 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 Right thigh pain 01/17/2017  . Right foot drop 12/23/2016  . Chronic right-sided low back pain with right-sided sciatica  12/23/2016  . Pedal edema 10/07/2016  . Endometrial cancer ([place]) 08/05/2016  . Superficial postoperative wound infection 07/22/2016  . 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 Diabetic neuropathy ([place]) 05/27/2016  . Anemia 05/08/2016  . DUB (dysfunctional uterine bleeding) 05/08/2016  . Type 2 diabetes mellitus ([place]) 02/16/2016  . Morbid obesity ([facility]) 04/12/2015  . Depression 04/12/2015  . Asthma 03/30/2015  . Hypoxemia 03/30/2015  . Hypothyroidism 03/30/2015  . Thrombocytopenia ([place]) 03/30/2015  . Hypokalemia 03/30/2015   Past Medical History:  Diagnosis Date  . Anemia   . Asthma   . Depression   . Hypothyroidism   . Pre-diabetes   . Right foot drop   . Sleep apnea   . Thyroid disease   . Uterine cancer ([place])     Family History  Problem Relation Age of Onset  . Heart attack Mother   . Suicidality Father     Past Surgical History:  Procedure Laterality Date  . CARPAL TUNNEL RELEASE    . CESAREAN SECTION    . CHOLECYSTECTOMY    . cyst removal     uterus  . DILATION AND CURETTAGE OF UTERUS     still birth  . DILATION AND CURETTAGE OF UTERUS N/A 06/11/2016   Procedure: DILATATION AND CURETTAGE;  Surgeon: Emily Filbert, MD;  Location: [facility];  Service: Gynecology;  Laterality: N/A;  . ROBOTIC ASSISTED TOTAL HYSTERECTOMY WITH BILATERAL SALPINGO OOPHERECTOMY Bilateral 07/09/2016   Procedure: XI ROBOTIC ASSISTED TOTAL HYSTERECTOMY WITH BILATERAL SALPINGO OOPHORECTOMY FOR UTERUS GREATER THAN 250 GRAMS, LYSIS OF ADHESIONS;  Surgeon: Everitt Amber, MD;  Location: [facility];  Service: Gynecology;  Laterality: Bilateral;  . SINUS EXPLORATION    . WISDOM TOOTH EXTRACTION     Social History   Occupational History  . Occupation: Unemployed  Tobacco Use  . Smoking status: Never Smoker  . Smokeless tobacco: Never Used  Substance and Sexual Activity  . Alcohol use: No  . Drug use: No  . Sexual activity: Not Currently    Birth control/protection: None

## 2017-07-25 ENCOUNTER — Telehealth: Payer: Self-pay | Admitting: *Deleted

## 2017-07-25 NOTE — Telephone Encounter (Signed)
Patient verified DOB Patient is aware of xray showing arthritis and being referred to the orthopedic. Patient was seen yesterday 07/24/17. No further questions.

## 2017-07-25 NOTE — Telephone Encounter (Signed)
-----   Message from Argentina Donovan, [place] sent at 07/24/2017  8:41 AM EDT ----- Odette Horns shows arthritis.  I have placed a referral for orthopedics. They will better be able to assess her knee.  Thanks, Freeman Caldron, PA-C

## 2017-07-29 DIAGNOSIS — J4520 Mild intermittent asthma, uncomplicated: Secondary | ICD-10-CM | POA: Insufficient documentation

## 2017-07-29 DIAGNOSIS — R011 Cardiac murmur, unspecified: Secondary | ICD-10-CM | POA: Insufficient documentation

## 2017-07-29 DIAGNOSIS — F419 Anxiety disorder, unspecified: Secondary | ICD-10-CM | POA: Insufficient documentation

## 2017-07-30 ENCOUNTER — Encounter: Payer: Self-pay | Admitting: Gynecologic Oncology

## 2017-07-30 ENCOUNTER — Telehealth: Payer: Self-pay

## 2017-07-30 NOTE — Telephone Encounter (Signed)
Outgoing call to patient per Janet John NP- she has pt's letter ready regarding her request for her upcoming bariatric surgery- no answer- left her a VM to let her know letter is ready in the cancer center - Dr Serita Grit office.

## 2017-07-30 NOTE — Telephone Encounter (Signed)
Incoming call from patient.  She would like to have bariatric surgery and she needs a letter clearing her for surgery stating she is cancer-free now and/if enlarged lymph nodes any concern for her proceeding with her bariatric surgery. Pt said she didn't know if she needed to schedule a f/u visit here in order to get the letter or if she can simply get a letter.  Needs the letter by August because most likely surgery will be in September.  Will notify Dr Rossi/ Joylene John NP and call pt back. Pt voiced understanding and no other needs per pt at this time.

## 2017-07-31 ENCOUNTER — Ambulatory Visit (INDEPENDENT_AMBULATORY_CARE_PROVIDER_SITE_OTHER): Payer: Medicaid Other | Admitting: Surgery

## 2017-08-04 ENCOUNTER — Other Ambulatory Visit (HOSPITAL_COMMUNITY): Payer: Medicaid Other

## 2017-08-04 ENCOUNTER — Telehealth: Payer: Self-pay

## 2017-08-04 NOTE — Telephone Encounter (Signed)
Outgoing call to patient to let her know that her letter per Joylene John NP was ready for pick ( see my last telephone note). Pt said she was able to pull it up on mychart and will email or print it out when needed.  No other needs per pt at this time.

## 2017-08-06 ENCOUNTER — Ambulatory Visit (INDEPENDENT_AMBULATORY_CARE_PROVIDER_SITE_OTHER): Payer: Medicaid Other | Admitting: Surgery

## 2017-08-06 ENCOUNTER — Encounter (INDEPENDENT_AMBULATORY_CARE_PROVIDER_SITE_OTHER): Payer: Self-pay | Admitting: Surgery

## 2017-08-06 ENCOUNTER — Ambulatory Visit (HOSPITAL_COMMUNITY)
Admission: RE | Admit: 2017-08-06 | Discharge: 2017-08-06 | Disposition: A | Discharge status: Home / Self Care | Payer: Medicaid Other | Source: Ambulatory Visit | Attending: Family Medicine | Admitting: Family Medicine

## 2017-08-06 DIAGNOSIS — M1712 Unilateral primary osteoarthritis, left knee: Secondary | ICD-10-CM | POA: Diagnosis not present

## 2017-08-06 DIAGNOSIS — Z01818 Encounter for other preprocedural examination: Secondary | ICD-10-CM | POA: Insufficient documentation

## 2017-08-06 DIAGNOSIS — I071 Rheumatic tricuspid insufficiency: Secondary | ICD-10-CM | POA: Diagnosis not present

## 2017-08-06 DIAGNOSIS — R600 Localized edema: Secondary | ICD-10-CM | POA: Insufficient documentation

## 2017-08-06 NOTE — Progress Notes (Signed)
47 year old white female history of left knee pain returns.  Fingerstick glucose in the clinic today 102.  Patient states that she continues to have knee pain but is wondering if there are other options that she has other than cortisone injection that was previously discussed last office visit.  Patient is scheduled to undergo bariatric surgery in August and was advised by that clinic that she cannot have any injections within 90 days of that procedure.  Exam Gait is somewhat antalgic.  Good knee range of motion.  Plan I advised patient that she is describing pain mechanical symptoms consistent with possible meniscal tear.  Recommend that she does proceed with the bariatric surgery in August.  Conservative manage knee pain with ice, activity modification and anti-inflammatory.  She will be in contact with [place] after she has had her procedure for further treatment of knee pain.  When she is given the okay I would recommend intra-articular knee injection and patient will also eventually need MRI left knee to rule out meniscal tear.

## 2017-08-06 NOTE — Progress Notes (Signed)
  Echocardiogram 2D Echocardiogram has been performed.  Janet [name] 08/06/2017, 1:55 PM

## 2017-08-07 ENCOUNTER — Telehealth: Payer: Self-pay | Admitting: Family Medicine

## 2017-08-07 DIAGNOSIS — D509 Iron deficiency anemia, unspecified: Secondary | ICD-10-CM | POA: Insufficient documentation

## 2017-08-07 NOTE — Telephone Encounter (Signed)
13 page, paperwork recieved through fax 08-07-17.

## 2017-08-13 ENCOUNTER — Ambulatory Visit: Payer: Medicaid Other | Admitting: Family Medicine

## 2017-08-20 ENCOUNTER — Ambulatory Visit: Payer: Medicaid Other | Admitting: Family Medicine

## 2017-08-25 ENCOUNTER — Telehealth: Payer: Self-pay

## 2017-08-25 NOTE — Telephone Encounter (Signed)
Request received from Dr Margarita Rana to follow up on a fax request from [place].  Call placed to Wendie Simmer # [phone] to clarify the medicaid transportation exception verification.  She stated that the patient is re-imbursed for her transportation to medical appoints . The document just needs to note that the patient is an existing patient.   Completed document faxed back to DSS - attention Wendie Simmer

## 2017-08-27 ENCOUNTER — Encounter: Payer: Self-pay | Admitting: Family Medicine

## 2017-09-03 ENCOUNTER — Telehealth: Payer: Self-pay

## 2017-09-03 NOTE — Telephone Encounter (Signed)
Call placed to [facility] DSS # [phone] and she confirmed receipt of the transportation documentation.

## 2017-09-16 DIAGNOSIS — R011 Cardiac murmur, unspecified: Secondary | ICD-10-CM | POA: Insufficient documentation

## 2017-09-17 ENCOUNTER — Encounter: Payer: Self-pay | Admitting: Family Medicine

## 2017-09-17 ENCOUNTER — Ambulatory Visit: Payer: Medicaid Other | Attending: Family Medicine | Admitting: Family Medicine

## 2017-09-17 VITALS — BP 125/77 | HR 72 | Temp 98.4°F | Ht 69.0 in | Wt 371.2 lb

## 2017-09-17 DIAGNOSIS — M21371 Foot drop, right foot: Secondary | ICD-10-CM | POA: Diagnosis not present

## 2017-09-17 DIAGNOSIS — Z8542 Personal history of malignant neoplasm of other parts of uterus: Secondary | ICD-10-CM | POA: Diagnosis present

## 2017-09-17 DIAGNOSIS — F330 Major depressive disorder, recurrent, mild: Secondary | ICD-10-CM | POA: Insufficient documentation

## 2017-09-17 DIAGNOSIS — G4730 Sleep apnea, unspecified: Secondary | ICD-10-CM | POA: Insufficient documentation

## 2017-09-17 DIAGNOSIS — E1140 Type 2 diabetes mellitus with diabetic neuropathy, unspecified: Secondary | ICD-10-CM | POA: Insufficient documentation

## 2017-09-17 DIAGNOSIS — E039 Hypothyroidism, unspecified: Secondary | ICD-10-CM | POA: Insufficient documentation

## 2017-09-17 DIAGNOSIS — E1149 Type 2 diabetes mellitus with other diabetic neurological complication: Secondary | ICD-10-CM | POA: Diagnosis not present

## 2017-09-17 DIAGNOSIS — Z79899 Other long term (current) drug therapy: Secondary | ICD-10-CM | POA: Diagnosis not present

## 2017-09-17 DIAGNOSIS — Z23 Encounter for immunization: Secondary | ICD-10-CM | POA: Diagnosis not present

## 2017-09-17 DIAGNOSIS — J4540 Moderate persistent asthma, uncomplicated: Secondary | ICD-10-CM | POA: Insufficient documentation

## 2017-09-17 LAB — POCT GLYCOSYLATED HEMOGLOBIN (HGB A1C): HbA1c, POC (controlled diabetic range): 5.6 % (ref 0.0–7.0)

## 2017-09-17 LAB — GLUCOSE, POCT (MANUAL RESULT ENTRY): POC GLUCOSE: 93 mg/dL (ref 70–99)

## 2017-09-17 MED ORDER — DULOXETINE HCL 60 MG PO CPEP: 60.0000 mg | ORAL_CAPSULE | Freq: Every day | ORAL | 1 refills | Status: DC

## 2017-09-17 MED ORDER — PREGABALIN 75 MG PO CAPS: 75.0000 mg | ORAL_CAPSULE | Freq: Two times a day (BID) | ORAL | 3 refills | Status: DC

## 2017-09-17 MED ORDER — MONTELUKAST SODIUM 10 MG PO TABS: 10.0000 mg | ORAL_TABLET | Freq: Every day | ORAL | 1 refills | Status: DC

## 2017-09-17 NOTE — Patient Instructions (Signed)

## 2017-09-17 NOTE — Progress Notes (Signed)
Subjective:  Patient ID: Janet [name], female    DOB: November 27, 1970  Age: 47 y.o. MRN: [id number]  CC: Diabetes   HPI Janet [name]   is a 47 year old female with a history of type 2 diabetes mellitus (diet controlled with A1c of 5.6), Diabetic neuropathy, hypothyroidism, asthma, Seasonal allergies, endometrial adenocarcinoma (s/pTotal abdominal hysterectomy and bilateral salpingo-oophorectomy in 06/2016, adjuvant treatment due to low risk for recurrence),  morbid obesity who presents today for follow-up visit. Her A1c is 5.6 which has improved from 6.7 previously and she denies hypoglycemia or visual concerns.  Her neuropathy is controlled on Lyrica.  She has also not needed her antihypertensives as she has lost 18 pounds in the last 6 months by means of exercising and complying with a diet. Currently being seen by [facility] bariatric surgery with plans for bariatric surgery next month. With regards to her asthma she was advised to hold off on Advair at her pulmonary visit yesterday due to not needing it.  She rarely uses her MDI as her asthma symptoms are controlled. Her right foot is slightly weak from her foot drop but she has noticed some improvement after physical therapy and is currently taking Trileptal and followed by neurology.  Past Medical History:  Diagnosis Date  . Anemia   . Asthma   . Depression   . Hypothyroidism   . Pre-diabetes   . Right foot drop   . Sleep apnea   . Thyroid disease   . Uterine cancer [facility])     Past Surgical History:  Procedure Laterality Date  . CARPAL TUNNEL RELEASE    . CESAREAN SECTION    . CHOLECYSTECTOMY    . cyst removal     uterus  . DILATION AND CURETTAGE OF UTERUS     still birth  . DILATION AND CURETTAGE OF UTERUS N/A 06/11/2016   Procedure: DILATATION AND CURETTAGE;  Surgeon: Emily Filbert, MD;  Location: [facility];  Service: Gynecology;  Laterality: N/A;  . ROBOTIC ASSISTED TOTAL HYSTERECTOMY WITH BILATERAL SALPINGO  OOPHERECTOMY Bilateral 07/09/2016   Procedure: XI ROBOTIC ASSISTED TOTAL HYSTERECTOMY WITH BILATERAL SALPINGO OOPHORECTOMY FOR UTERUS GREATER THAN 250 GRAMS, LYSIS OF ADHESIONS;  Surgeon: Everitt Amber, MD;  Location: [facility];  Service: Gynecology;  Laterality: Bilateral;  . SINUS EXPLORATION    . WISDOM TOOTH EXTRACTION      Allergies  Allergen Reactions  . Ciprofloxacin Shortness Of Breath  . Shrimp [Shellfish Allergy]      Outpatient Medications Prior to Visit  Medication Sig Dispense Refill  . albuterol (PROVENTIL HFA;VENTOLIN HFA) 108 (90 Base) MCG/ACT inhaler Inhale 1-2 puffs into the lungs every 6 (six) hours as needed for wheezing or shortness of breath. 54 g 3  . albuterol (PROVENTIL) (2.5 MG/3ML) 0.083% nebulizer solution Take 3 mLs (2.5 mg total) by nebulization every 6 (six) hours as needed for wheezing or shortness of breath. 75 mL 3  . cetirizine (ZYRTEC) 10 MG tablet Take 10 mg by mouth daily.    . Fluticasone-Salmeterol (ADVAIR DISKUS) 250-50 MCG/DOSE AEPB Inhale 1 puff into the lungs 2 (two) times daily. 180 each 1  . ibuprofen (ADVIL,MOTRIN) 600 MG tablet Take 1 tablet (600 mg total) by mouth every 6 (six) hours as needed. 30 tablet 1  . levothyroxine (SYNTHROID, LEVOTHROID) 175 MCG tablet Take 1 tablet (175 mcg total) by mouth daily before breakfast. 90 tablet 1  . Lidocaine 4 % GEL Apply to right toe as needed. 10 g 11  . meloxicam (MOBIC)  15 MG tablet Take 1 tablet (15 mg total) by mouth daily. 30 tablet 6  . OXcarbazepine (TRILEPTAL) 150 MG tablet Take 1 tablet (150 mg total) by mouth 2 (two) times daily. 180 tablet 4  . DULoxetine (CYMBALTA) 60 MG capsule Take 1 capsule (60 mg total) by mouth daily. 90 capsule 1  . furosemide (LASIX) 20 MG tablet Take 1 tablet (20 mg total) by mouth daily. 30 tablet 1  . montelukast (SINGULAIR) 10 MG tablet Take 1 tablet (10 mg total) by mouth at bedtime. 90 tablet 1  . pregabalin (LYRICA) 75 MG capsule Take 1 capsule (75 mg total) by  mouth 2 (two) times daily. 60 capsule 3  . lisinopril-hydrochlorothiazide (PRINZIDE,ZESTORETIC) 10-12.5 MG tablet Take 1 tablet by mouth daily. (Patient not taking: Reported on 09/17/2017) 90 tablet 1   No facility-administered medications prior to visit.     ROS Review of Systems  Constitutional: Negative for activity change, appetite change and fatigue.  HENT: Negative for congestion, sinus pressure and sore throat.   Eyes: Negative for visual disturbance.  Respiratory: Negative for cough, chest tightness, shortness of breath and wheezing.   Cardiovascular: Negative for chest pain and palpitations.  Gastrointestinal: Negative for abdominal distention, abdominal pain and constipation.  Endocrine: Negative for polydipsia.  Genitourinary: Negative for dysuria and frequency.  Musculoskeletal: Negative for arthralgias and back pain.  Skin: Negative for rash.  Neurological: Negative for tremors, light-headedness and numbness.  Hematological: Does not bruise/bleed easily.  Psychiatric/Behavioral: Negative for agitation and behavioral problems.    Objective:  BP 125/77   Pulse 72   Temp 98.4 F (36.9 C) (Oral)   Ht '5\' 9"'  (1.753 m)   Wt (!) 371 lb 3.2 oz (168.4 kg)   LMP  (LMP Unknown) Comment: irregular periods previously, bleeding started in january  SpO2 97%   BMI 54.82 kg/m   BP/Weight 09/17/2017 07/24/2017 0/86/5784  Systolic BP 696 295 284  Diastolic BP 77 56 77  Wt. (Lbs) 371.2 385 385  BMI 54.82 56.85 56.85      Physical Exam  Constitutional: She is oriented to person, place, and time. She appears well-developed and well-nourished.  Obese  Cardiovascular: Normal rate and intact distal pulses.  Murmur (2/6 systolic murmur) heard. Pulmonary/Chest: Effort normal and breath sounds normal. She has no wheezes. She has no rales. She exhibits no tenderness.  Abdominal: Soft. Bowel sounds are normal. She exhibits no distension and no mass. There is no tenderness.    Musculoskeletal: Normal range of motion.  Neurological: She is alert and oriented to person, place, and time.  Reduced dorsiflexion and plantarflexion of right foot  Skin: Skin is warm and dry.  Psychiatric: She has a normal mood and affect.    CMP Latest Ref Rng & Units 05/26/2017 11/21/2016 11/18/2016  Glucose 65 - 99 mg/dL 103(H) - 105(H)  BUN 6 - 24 mg/dL 7 - 7  Creatinine 0.57 - 1.00 mg/dL 0.77 0.70 0.69  Sodium 134 - 144 mmol/L 143 - 141  Potassium 3.5 - 5.2 mmol/L 3.9 - 4.1  Chloride 96 - 106 mmol/L 104 - 104  CO2 20 - 29 mmol/L 24 - 28  Calcium 8.7 - 10.2 mg/dL 9.0 - 9.0  Total Protein 6.0 - 8.5 g/dL 7.2 - 6.8  Total Bilirubin 0.0 - 1.2 mg/dL 0.5 - 0.6  Alkaline Phos 39 - 117 IU/L 138(H) - 106  AST 0 - 40 IU/L 35 - 23  ALT 0 - 32 IU/L 19 - 14  Lipid Panel     Component Value Date/Time   CHOL 131 11/18/2016 0940   TRIG 71 11/18/2016 0940   HDL 67 11/18/2016 0940   CHOLHDL 2.0 11/18/2016 0940   CHOLHDL 2.1 12/26/2015 0929   VLDL 15 12/26/2015 0929   LDLCALC 50 11/18/2016 0940    Lab Results  Component Value Date   TSH 4.920 (H) 05/26/2017    Lab Results  Component Value Date   HGBA1C 5.6 09/17/2017    Assessment & Plan:   1. Type 2 diabetes mellitus with other neurologic complication, without long-term current use of insulin (HCC) Diet controlled with A1c of 5.6 Counseled on Diabetic diet, my plate method, 473 minutes of moderate intensity exercise/week Keep blood sugar logs with fasting goals of 80-120 mg/dl, random of less than 180 and in the event of sugars less than 60 mg/dl or greater than 400 mg/dl please notify the clinic ASAP. It is recommended that you undergo annual eye exams and annual foot exams. Pneumonia vaccine is recommended. - POCT glucose (manual entry) - POCT glycosylated hemoglobin (Hb A1C) - pregabalin (LYRICA) 75 MG capsule; Take 1 capsule (75 mg total) by mouth 2 (two) times daily.  Dispense: 60 capsule; Refill: 3 - CMP14+EGFR -  Lipid panel  2. Moderate persistent asthma without complication Stable Has not been using Advair-advised to hold off by pulmonary and continue with albuterol MDI as needed - montelukast (SINGULAIR) 10 MG tablet; Take 1 tablet (10 mg total) by mouth at bedtime.  Dispense: 90 tablet; Refill: 1  3. Mild episode of recurrent major depressive disorder (HCC) Controlled - DULoxetine (CYMBALTA) 60 MG capsule; Take 1 capsule (60 mg total) by mouth daily.  Dispense: 90 capsule; Refill: 1  4. Hypothyroidism, unspecified type We will send of thyroid labs and adjust levothyroxine dose accordingly - T4, free - TSH  5. Right foot drop Currently on Trileptal Completed course of physical therapy with some improvement Followed by neurology  6. Morbid obesity ([place]) Completed clearance form for bariatric surgery comes up next month   Meds ordered this encounter  Medications  . pregabalin (LYRICA) 75 MG capsule    Sig: Take 1 capsule (75 mg total) by mouth 2 (two) times daily.    Dispense:  60 capsule    Refill:  3  . montelukast (SINGULAIR) 10 MG tablet    Sig: Take 1 tablet (10 mg total) by mouth at bedtime.    Dispense:  90 tablet    Refill:  1  . DULoxetine (CYMBALTA) 60 MG capsule    Sig: Take 1 capsule (60 mg total) by mouth daily.    Dispense:  90 capsule    Refill:  1    Follow-up: No follow-ups on file.   Charlott Rakes MD

## 2017-09-18 ENCOUNTER — Other Ambulatory Visit: Payer: Self-pay | Admitting: Family Medicine

## 2017-09-18 LAB — CMP14+EGFR
ALBUMIN: 4.3 g/dL (ref 3.5–5.5)
ALK PHOS: 137 IU/L — AB (ref 39–117)
ALT: 24 IU/L (ref 0–32)
AST: 46 IU/L — ABNORMAL HIGH (ref 0–40)
Albumin/Globulin Ratio: 1.8 (ref 1.2–2.2)
BUN/Creatinine Ratio: 10 (ref 9–23)
BUN: 7 mg/dL (ref 6–24)
Bilirubin Total: 0.6 mg/dL (ref 0.0–1.2)
CHLORIDE: 108 mmol/L — AB (ref 96–106)
CO2: 23 mmol/L (ref 20–29)
CREATININE: 0.69 mg/dL (ref 0.57–1.00)
Calcium: 8.9 mg/dL (ref 8.7–10.2)
GFR calc Af Amer: 118 mL/min/{1.73_m2} (ref >59)
GFR calc non Af Amer: 103 mL/min/{1.73_m2} (ref >59)
GLUCOSE: 97 mg/dL (ref 65–99)
Globulin, Total: 2.4 g/dL (ref 1.5–4.5)
Potassium: 4 mmol/L (ref 3.5–5.2)
Sodium: 145 mmol/L — ABNORMAL HIGH (ref 134–144)
Total Protein: 6.7 g/dL (ref 6.0–8.5)

## 2017-09-18 LAB — LIPID PANEL
CHOLESTEROL TOTAL: 126 mg/dL (ref 100–199)
Chol/HDL Ratio: 2.3 ratio (ref 0.0–4.4)
HDL: 54 mg/dL (ref >39)
LDL CALC: 57 mg/dL (ref 0–99)
TRIGLYCERIDES: 76 mg/dL (ref 0–149)
VLDL Cholesterol Cal: 15 mg/dL (ref 5–40)

## 2017-09-18 LAB — T4, FREE: Free T4: 1.15 ng/dL (ref 0.82–1.77)

## 2017-09-18 LAB — TSH: TSH: 0.989 u[IU]/mL (ref 0.450–4.500)

## 2017-09-26 ENCOUNTER — Encounter (HOSPITAL_COMMUNITY): Payer: Self-pay | Admitting: Emergency Medicine

## 2017-09-26 ENCOUNTER — Emergency Department (HOSPITAL_COMMUNITY)
Admission: EM | Admit: 2017-09-26 | Discharge: 2017-09-27 | Disposition: A | Discharge status: Home / Self Care | Payer: Medicaid Other | Attending: Emergency Medicine | Admitting: Emergency Medicine

## 2017-09-26 ENCOUNTER — Other Ambulatory Visit: Payer: Self-pay

## 2017-09-26 DIAGNOSIS — E119 Type 2 diabetes mellitus without complications: Secondary | ICD-10-CM | POA: Insufficient documentation

## 2017-09-26 DIAGNOSIS — Z9049 Acquired absence of other specified parts of digestive tract: Secondary | ICD-10-CM | POA: Diagnosis not present

## 2017-09-26 DIAGNOSIS — Z79899 Other long term (current) drug therapy: Secondary | ICD-10-CM | POA: Diagnosis not present

## 2017-09-26 DIAGNOSIS — F329 Major depressive disorder, single episode, unspecified: Secondary | ICD-10-CM | POA: Insufficient documentation

## 2017-09-26 DIAGNOSIS — I10 Essential (primary) hypertension: Secondary | ICD-10-CM | POA: Diagnosis not present

## 2017-09-26 DIAGNOSIS — Z8542 Personal history of malignant neoplasm of other parts of uterus: Secondary | ICD-10-CM | POA: Diagnosis not present

## 2017-09-26 DIAGNOSIS — F419 Anxiety disorder, unspecified: Secondary | ICD-10-CM | POA: Insufficient documentation

## 2017-09-26 DIAGNOSIS — M5440 Lumbago with sciatica, unspecified side: Secondary | ICD-10-CM | POA: Insufficient documentation

## 2017-09-26 DIAGNOSIS — J45909 Unspecified asthma, uncomplicated: Secondary | ICD-10-CM | POA: Diagnosis not present

## 2017-09-26 DIAGNOSIS — R51 Headache: Secondary | ICD-10-CM | POA: Insufficient documentation

## 2017-09-26 DIAGNOSIS — E039 Hypothyroidism, unspecified: Secondary | ICD-10-CM | POA: Diagnosis not present

## 2017-09-26 DIAGNOSIS — R519 Headache, unspecified: Secondary | ICD-10-CM

## 2017-09-26 HISTORY — DX: Anxiety disorder, unspecified: F41.9

## 2017-09-26 LAB — CBC
HCT: 37.6 % (ref 36.0–46.0)
HEMOGLOBIN: 11.5 g/dL — AB (ref 12.0–15.0)
MCH: 24 pg — AB (ref 26.0–34.0)
MCHC: 30.6 g/dL (ref 30.0–36.0)
MCV: 78.5 fL (ref 78.0–100.0)
PLATELETS: 65 10*3/uL — AB (ref 150–400)
RBC: 4.79 MIL/uL (ref 3.87–5.11)
RDW: 24.9 % — ABNORMAL HIGH (ref 11.5–15.5)
WBC: 3 10*3/uL — ABNORMAL LOW (ref 4.0–10.5)

## 2017-09-26 LAB — BASIC METABOLIC PANEL
ANION GAP: 6 (ref 5–15)
BUN: 9 mg/dL (ref 6–20)
CALCIUM: 8.7 mg/dL — AB (ref 8.9–10.3)
CO2: 25 mmol/L (ref 22–32)
CREATININE: 0.66 mg/dL (ref 0.44–1.00)
Chloride: 107 mmol/L (ref 98–111)
GLUCOSE: 96 mg/dL (ref 70–99)
Potassium: 3.8 mmol/L (ref 3.5–5.1)
Sodium: 138 mmol/L (ref 135–145)

## 2017-09-26 LAB — URINALYSIS, ROUTINE W REFLEX MICROSCOPIC
BILIRUBIN URINE: NEGATIVE
Glucose, UA: NEGATIVE mg/dL
Hgb urine dipstick: NEGATIVE
KETONES UR: NEGATIVE mg/dL
LEUKOCYTES UA: NEGATIVE
NITRITE: NEGATIVE
PROTEIN: NEGATIVE mg/dL
Specific Gravity, Urine: 1.008 (ref 1.005–1.030)
pH: 8 (ref 5.0–8.0)

## 2017-09-26 NOTE — ED Triage Notes (Signed)
C/o headache x 2 days.  No neuro deficits noted.  Also reports lower back pain since this morning.  Denies dysuria.  Urine dark x 1 week.

## 2017-09-26 NOTE — ED Notes (Signed)
Pt is experiencing nausea now per son. Given emesis bag.

## 2017-09-27 MED ORDER — METHOCARBAMOL 500 MG PO TABS: 500.0000 mg | ORAL_TABLET | Freq: Three times a day (TID) | ORAL | 0 refills | Status: DC | PRN

## 2017-09-27 MED ORDER — PROCHLORPERAZINE EDISYLATE 10 MG/2ML IJ SOLN
10.0000 mg | Freq: Once | INTRAMUSCULAR | Status: AC
  Administered 2017-09-27: 10 mg via INTRAVENOUS
  Filled 2017-09-27: qty 2

## 2017-09-27 MED ORDER — KETOROLAC TROMETHAMINE 30 MG/ML IJ SOLN
30.0000 mg | Freq: Once | INTRAMUSCULAR | Status: AC
  Administered 2017-09-27: 30 mg via INTRAVENOUS
  Filled 2017-09-27: qty 1

## 2017-09-27 MED ORDER — SODIUM CHLORIDE 0.9 % IV BOLUS
1000.0000 mL | Freq: Once | INTRAVENOUS | Status: AC
  Administered 2017-09-27: 1000 mL via INTRAVENOUS

## 2017-09-27 NOTE — ED Provider Notes (Signed)
[place] EMERGENCY DEPARTMENT Provider Note   CSN: [id number] Arrival date & time: 09/26/17  1848     History   Chief Complaint Chief Complaint  Patient presents with  . Headache  . Back Pain    HPI Janet [name] is a 47 y.o. female.  HPI 47 year old female with no history of significant headaches presents to the emergency department with headache over the past 2 days.  It is generalized headache.  No change in her vision.  No nausea or vomiting.  No history of head injury.  She also reports new mild low back pain with radiation towards her bilateral buttock regions since this morning.  No fevers or chills.  She currently is on a restricted diet as she is preparing for bariatric surgery.  She is lost over 20 pounds.  She reports darker urine over the past week without dysuria or urinary frequency.  Symptoms are moderate in severity.  No chest pain or abdominal pain.  No cough or congestion.  No diarrhea.  No nausea or vomiting.   Past Medical History:  Diagnosis Date  . Anemia   . Anxiety   . Asthma   . Depression   . Hypothyroidism   . Pre-diabetes   . Right foot drop   . Sleep apnea   . Thyroid disease   . Uterine cancer [facility])     Patient Active Problem List   Diagnosis Date Noted  . Cirrhosis ([place]) 05/27/2017  . Hypertension 05/26/2017  . 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 Right thigh pain 01/17/2017  . Right foot drop 12/23/2016  . Chronic right-sided low back pain with right-sided sciatica 12/23/2016  . Pedal edema 10/07/2016  . Endometrial cancer ([place]) 08/05/2016  . Superficial postoperative wound infection 07/22/2016  . 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 Diabetic neuropathy ([place]) 05/27/2016  . Anemia 05/08/2016  . DUB (dysfunctional uterine bleeding) 05/08/2016  . Type 2 diabetes mellitus ([place]) 02/16/2016  . Morbid obesity ([place]) 04/12/2015  . Depression 04/12/2015  . Asthma  03/30/2015  . Hypoxemia 03/30/2015  . Hypothyroidism 03/30/2015  . Thrombocytopenia ([place]) 03/30/2015  . Hypokalemia 03/30/2015    Past Surgical History:  Procedure Laterality Date  . ABDOMINAL HYSTERECTOMY    . CARPAL TUNNEL RELEASE    . CESAREAN SECTION    . CHOLECYSTECTOMY    . cyst removal     uterus  . DILATION AND CURETTAGE OF UTERUS     still birth  . DILATION AND CURETTAGE OF UTERUS N/A 06/11/2016   Procedure: DILATATION AND CURETTAGE;  Surgeon: Emily Filbert, MD;  Location: [facility];  Service: Gynecology;  Laterality: N/A;  . ROBOTIC ASSISTED TOTAL HYSTERECTOMY WITH BILATERAL SALPINGO OOPHERECTOMY Bilateral 07/09/2016   Procedure: XI ROBOTIC ASSISTED TOTAL HYSTERECTOMY WITH BILATERAL SALPINGO OOPHORECTOMY FOR UTERUS GREATER THAN 250 GRAMS, LYSIS OF ADHESIONS;  Surgeon: Everitt Amber, MD;  Location: [facility];  Service: Gynecology;  Laterality: Bilateral;  . SINUS EXPLORATION    . WISDOM TOOTH EXTRACTION       OB History    Gravida  2   Para  2   Term  1   Preterm  1   AB  0   Living  1     SAB  0   TAB  0   Ectopic  0   Multiple  0   Live Births  1            Home Medications  Prior to Admission medications   Medication Sig Start Date End Date Taking? Authorizing Provider  albuterol (PROVENTIL HFA;VENTOLIN HFA) 108 (90 Base) MCG/ACT inhaler Inhale 1-2 puffs into the lungs every 6 (six) hours as needed for wheezing or shortness of breath. 05/26/17  Yes Charlott Rakes, MD  albuterol (PROVENTIL) (2.5 MG/3ML) 0.083% nebulizer solution Take 3 mLs (2.5 mg total) by nebulization every 6 (six) hours as needed for wheezing or shortness of breath. 02/18/17  Yes Charlott Rakes, MD  cetirizine (ZYRTEC) 10 MG tablet Take 10 mg by mouth daily.   Yes [provider]  cholecalciferol (VITAMIN D) 1000 units tablet Take 1,000 Units by mouth at bedtime.   Yes [provider]  DULoxetine (CYMBALTA) 60 MG capsule Take 1 capsule (60 mg total) by mouth  daily. Patient taking differently: Take 60 mg by mouth at bedtime.  09/17/17  Yes Newlin, Charlane Ferretti, MD  Fluticasone-Salmeterol (ADVAIR DISKUS) 250-50 MCG/DOSE AEPB Inhale 1 puff into the lungs 2 (two) times daily. Patient taking differently: Inhale 1 puff into the lungs as needed (allergies).  05/26/17  Yes Charlott Rakes, MD  levothyroxine (SYNTHROID, LEVOTHROID) 175 MCG tablet Take 1 tablet (175 mcg total) by mouth daily before breakfast. 05/27/17  Yes Newlin, [place], MD  Lidocaine 4 % GEL Apply to right toe as needed. 12/23/16  Yes Marcial Pacas, MD  meloxicam (MOBIC) 15 MG tablet Take 1 tablet (15 mg total) by mouth daily. Patient taking differently: Take 15 mg by mouth at bedtime.  04/17/17  Yes Marcial Pacas, MD  montelukast (SINGULAIR) 10 MG tablet Take 1 tablet (10 mg total) by mouth at bedtime. 09/17/17  Yes Charlott Rakes, MD  OXcarbazepine (TRILEPTAL) 150 MG tablet Take 1 tablet (150 mg total) by mouth 2 (two) times daily. 04/17/17  Yes Marcial Pacas, MD  Pediatric Multivitamins-Iron (CHILDRENS MULTI-VITS/IRON PO) Take 1 tablet by mouth at bedtime.   Yes [provider]  pregabalin (LYRICA) 75 MG capsule Take 1 capsule (75 mg total) by mouth 2 (two) times daily. 09/17/17  Yes Charlott Rakes, MD  ibuprofen (ADVIL,MOTRIN) 600 MG tablet Take 1 tablet (600 mg total) by mouth every 6 (six) hours as needed. Patient not taking: Reported on 09/26/2017 07/23/17   Argentina Donovan, PA-C  methocarbamol (ROBAXIN) 500 MG tablet Take 1 tablet (500 mg total) by mouth every 8 (eight) hours as needed for muscle spasms. 09/27/17   Jola Schmidt, MD    Family History Family History  Problem Relation Age of Onset  . Heart attack Mother   . Suicidality Father     Social History Social History   Tobacco Use  . Smoking status: Never Smoker  . Smokeless tobacco: Never Used  Substance Use Topics  . Alcohol use: No  . Drug use: No     Allergies   Ciprofloxacin and Shrimp [shellfish allergy]   Review of  Systems Review of Systems  All other systems reviewed and are negative.    Physical Exam Updated Vital Signs BP (!) 124/57 (BP Location: Left Arm)   Pulse 88   Temp 100 F (37.8 C)   Resp 17   LMP  (LMP Unknown) Comment: irregular periods previously, bleeding started in january  SpO2 98%   Physical Exam  Constitutional: She is oriented to person, place, and time. She appears well-developed and well-nourished. No distress.  HENT:  Head: Normocephalic and atraumatic.  Eyes: Pupils are equal, round, and reactive to light. EOM are normal.  Neck: Normal range of motion.  Cardiovascular:  Normal rate, regular rhythm and normal heart sounds.  Pulmonary/Chest: Effort normal and breath sounds normal.  Abdominal: Soft. She exhibits no distension. There is no tenderness.  Musculoskeletal: Normal range of motion.  No thoracic or lumbar point tenderness  Neurological: She is alert and oriented to person, place, and time.  5/5 strength in major muscle groups of  bilateral upper and lower extremities. Speech normal. No facial asymetry.   Skin: Skin is warm and dry.  Psychiatric: She has a normal mood and affect. Judgment normal.  Nursing note and vitals reviewed.    ED Treatments / Results  Labs (all labs ordered are listed, but only abnormal results are displayed) Labs Reviewed  BASIC METABOLIC PANEL - Abnormal; Notable for the following components:      Result Value   Calcium 8.7 (*)    All other components within normal limits  CBC - Abnormal; Notable for the following components:   WBC 3.0 (*)    Hemoglobin 11.5 (*)    MCH 24.0 (*)    RDW 24.9 (*)    Platelets 65 (*)    All other components within normal limits  URINE CULTURE  URINALYSIS, ROUTINE W REFLEX MICROSCOPIC    EKG None  Radiology No results found.  Procedures Procedures (including critical care time)  Medications Ordered in ED Medications  sodium chloride 0.9 % bolus 1,000 mL (1,000 mLs Intravenous Bolus  from Bag 09/27/17 0107)  ketorolac (TORADOL) 30 MG/ML injection 30 mg (30 mg Intravenous Given 09/27/17 0108)  prochlorperazine (COMPAZINE) injection 10 mg (10 mg Intravenous Given 09/27/17 0116)     Initial Impression / Assessment and Plan / ED Course  I have reviewed the triage vital signs and the nursing notes.  Pertinent labs & imaging results that were available during my care of the patient were reviewed by me and considered in my medical decision making (see chart for details).    Patient is overall well-appearing.  No meningeal signs.  Symptoms improving in the emergency department with hydration.  I suspect some this may represented mild dehydration.  Discharged home in good condition.  Primary care follow-up.  Nonspecific low back pain with some sciatica-like symptoms.  No indication for emergent MRI.  No weakness in her legs.   Final Clinical Impressions(s) / ED Diagnoses   Final diagnoses:  Acute nonintractable headache, unspecified headache type  Acute bilateral low back pain with sciatica, sciatica laterality unspecified    ED Discharge Orders         Ordered    methocarbamol (ROBAXIN) 500 MG tablet  Every 8 hours PRN     09/27/17 0151           Jola Schmidt, MD 09/27/17 0155

## 2017-09-28 LAB — URINE CULTURE

## 2017-09-29 ENCOUNTER — Telehealth: Payer: Self-pay | Admitting: Emergency Medicine

## 2017-09-29 NOTE — Telephone Encounter (Signed)
Post ED Visit - Positive Culture Follow-up  Culture report reviewed by antimicrobial stewardship pharmacist:  []  Elenor Quinones, Pharm.D. []  Heide Guile, Pharm.D., BCPS AQ-ID []  Parks Neptune, Pharm.D., BCPS []  Alycia Rossetti, Pharm.D., BCPS []  [place], Pharm.D., BCPS, AAHIVP []  Legrand Como, Pharm.D., BCPS, AAHIVP []  Salome Arnt, PharmD, BCPS []  Johnnette Gourd, PharmD, BCPS []  Hughes Better, PharmD, BCPS []  Leeroy Cha, PharmD  Positive urine culture Treated with none, asymptomatic, low colony count,  no further patient follow-up is required at this time.  Hazle Nordmann 09/29/2017, 10:37 AM

## 2017-10-02 ENCOUNTER — Telehealth: Payer: Self-pay | Admitting: Neurology

## 2017-10-02 NOTE — Telephone Encounter (Signed)
Per vo by Dr. Krista Blue, the patient should keep her appt on 10/20/17 for an updated evaluation.  She will make a decision on the handicap placard at that time (temporary vs permanent).  I left a detailed message of her voicemail with this information (ok per DPR).

## 2017-10-02 NOTE — Telephone Encounter (Signed)
Pt's temporary handicap placard runs out this month. She is requesting a permanent placard. Please call to advise

## 2017-10-15 ENCOUNTER — Ambulatory Visit: Payer: Medicaid Other | Attending: Family Medicine | Admitting: Physician Assistant

## 2017-10-15 VITALS — BP 124/73 | HR 75 | Temp 98.4°F | Resp 18 | Ht 67.0 in | Wt 367.0 lb

## 2017-10-15 DIAGNOSIS — Z79890 Hormone replacement therapy (postmenopausal): Secondary | ICD-10-CM | POA: Insufficient documentation

## 2017-10-15 DIAGNOSIS — Z79899 Other long term (current) drug therapy: Secondary | ICD-10-CM | POA: Insufficient documentation

## 2017-10-15 DIAGNOSIS — L42 Pityriasis rosea: Secondary | ICD-10-CM | POA: Diagnosis present

## 2017-10-15 DIAGNOSIS — E1149 Type 2 diabetes mellitus with other diabetic neurological complication: Secondary | ICD-10-CM

## 2017-10-15 DIAGNOSIS — Z09 Encounter for follow-up examination after completed treatment for conditions other than malignant neoplasm: Secondary | ICD-10-CM

## 2017-10-15 DIAGNOSIS — Z8542 Personal history of malignant neoplasm of other parts of uterus: Secondary | ICD-10-CM | POA: Diagnosis not present

## 2017-10-15 DIAGNOSIS — R21 Rash and other nonspecific skin eruption: Secondary | ICD-10-CM | POA: Diagnosis not present

## 2017-10-15 DIAGNOSIS — E119 Type 2 diabetes mellitus without complications: Secondary | ICD-10-CM | POA: Insufficient documentation

## 2017-10-15 DIAGNOSIS — Z881 Allergy status to other antibiotic agents status: Secondary | ICD-10-CM | POA: Insufficient documentation

## 2017-10-15 LAB — GLUCOSE, POCT (MANUAL RESULT ENTRY): POC GLUCOSE: 113 mg/dL — AB (ref 70–99)

## 2017-10-15 MED ORDER — RANITIDINE HCL 150 MG PO CAPS: 150.0000 mg | ORAL_CAPSULE | Freq: Two times a day (BID) | ORAL | 1 refills | Status: DC

## 2017-10-15 MED ORDER — TRIAMCINOLONE ACETONIDE 0.1 % EX CREA: 1.0000 "application " | TOPICAL_CREAM | Freq: Two times a day (BID) | CUTANEOUS | 1 refills | Status: DC

## 2017-10-15 NOTE — Patient Instructions (Signed)
Pityriasis Rosea Pityriasis rosea is a rash that usually appears on the trunk of the body. It may also appear on the upper arms and upper legs. It usually begins as a single patch, and then more patches begin to develop. The rash may cause mild itching, but it normally does not cause other problems. It usually goes away without treatment. However, it may take weeks or months for the rash to go away completely. What are the causes? The cause of this condition is not known. The condition does not spread from person to person (is noncontagious). What increases the risk? This condition is more likely to develop in young adults and children. It is most common in the spring and fall. What are the signs or symptoms? The main symptom of this condition is a rash.  The rash usually begins with a single oval patch that is larger than the ones that follow. This is called a herald patch. It generally appears a week or more before the rest of the rash appears.  When more patches start to develop, they spread quickly on the trunk, back, and arms. These patches are smaller than the first one.  The patches that make up the rash are usually oval-shaped and pink or red in color. They are usually flat, but they may sometimes be raised so that they can be felt with a finger. They may also be finely crinkled and have a scaly ring around the edge.  The rash does not typically appear on areas of the skin that are exposed to the sun.  Most people who have this condition do not have other symptoms, but some have mild itching. In a few cases, a mild headache or body aches may occur before the rash appears and then go away. How is this diagnosed? Your health care provider may diagnose this condition by doing a physical exam and taking your medical history. To rule out other possible causes for the rash, the health care provider may order blood tests or take a skin sample from the rash to be looked at under a microscope. How  is this treated? Usually, treatment is not needed for this condition. The rash will probably go away on its own in 4-8 weeks. In some cases, a health care provider may recommend or prescribe medicine to reduce itching. Follow these instructions at home:  Take medicines only as directed by your health care provider.  Avoid scratching the affected areas of skin.  Do not take hot baths or use a sauna. Use only warm water when bathing or showering. Heat can increase itching. Contact a health care provider if:  Your rash does not go away in 8 weeks.  Your rash gets much worse.  You have a fever.  You have swelling or pain in the rash area.  You have fluid, blood, or pus coming from the rash area. This information is not intended to replace advice given to you by your health care provider. Make sure you discuss any questions you have with your health care provider. Document Released: 03/06/2001 Document Revised: 07/06/2015 Document Reviewed: 01/05/2014 Elsevier Interactive Patient Education  2018 [employer]  

## 2017-10-15 NOTE — Progress Notes (Signed)
Patient ID: Janet [name], female   DOB: 06-Jun-1970, 47 y.o.   MRN: [id number]     Janet [name], is a 47 y.o. female  WGN:[id number]  HQI:[id number]  DOB - [birth year]  Subjective:  Chief Complaint and HPI: Janet [name] is a 47 y.o. female here today to establish care and for a follow up visit after Seen in ED for HA on 09/26/2017.  This was believed to be caused by dehydration.  The patient is preparing for bariatric surgery and is on a strict eating plan.  She was discharged with a prescription of methocarbamol. No further HA.   Today she presents with a 6 day h/o rash that itches.  It started on her R arm and and is now on her chest, both arms, and her legs.  OTC cream and benadryl help some.  No new meds.  No new soaps/detergents.  No known tick bites, but [place].  Feels tired.  No URI/GI s/sx.    ED/Hospital notes reviewed and summarized above.     Social;  Preparing for bariatric surgery; gets surgery date on 9/17.    ROS:   Constitutional:  No f/c, No night sweats, No unexplained weight loss. EENT:  No vision changes, No blurry vision, No hearing changes. No mouth, throat, or ear problems.  Respiratory: No cough, No SOB Cardiac: No CP, no palpitations GI:  No abd pain, No N/V/D. GU: No Urinary s/sx Musculoskeletal: No joint pain Neuro: No headache, no dizziness, no motor weakness.  Skin: + rash Endocrine:  No polydipsia. No polyuria.  Psych: Denies SI/HI  No problems updated.  ALLERGIES: Allergies  Allergen Reactions  . Ciprofloxacin Shortness Of Breath  . Shrimp [Shellfish Allergy] Other (See Comments)    positive allergy test    PAST MEDICAL HISTORY: Past Medical History:  Diagnosis Date  . Anemia   . Anxiety   . Asthma   . Depression   . Hypothyroidism   . Pre-diabetes   . Right foot drop   . Sleep apnea   . Thyroid disease   . Uterine cancer [facility])     MEDICATIONS AT HOME: Prior to Admission  medications   Medication Sig Start Date End Date Taking? Authorizing Provider  albuterol (PROVENTIL HFA;VENTOLIN HFA) 108 (90 Base) MCG/ACT inhaler Inhale 1-2 puffs into the lungs every 6 (six) hours as needed for wheezing or shortness of breath. 05/26/17  Yes Charlott Rakes, MD  albuterol (PROVENTIL) (2.5 MG/3ML) 0.083% nebulizer solution Take 3 mLs (2.5 mg total) by nebulization every 6 (six) hours as needed for wheezing or shortness of breath. 02/18/17  Yes Charlott Rakes, MD  cetirizine (ZYRTEC) 10 MG tablet Take 10 mg by mouth daily.   Yes [provider]  cholecalciferol (VITAMIN D) 1000 units tablet Take 1,000 Units by mouth at bedtime.   Yes [provider]  DULoxetine (CYMBALTA) 60 MG capsule Take 1 capsule (60 mg total) by mouth daily. Patient taking differently: Take 60 mg by mouth at bedtime.  09/17/17  Yes Newlin, Charlane Ferretti, MD  Fluticasone-Salmeterol (ADVAIR DISKUS) 250-50 MCG/DOSE AEPB Inhale 1 puff into the lungs 2 (two) times daily. Patient taking differently: Inhale 1 puff into the lungs as needed (allergies).  05/26/17  Yes Charlott Rakes, MD  levothyroxine (SYNTHROID, LEVOTHROID) 175 MCG tablet Take 1 tablet (175 mcg total) by mouth daily before breakfast. 05/27/17  Yes Newlin, Enobong, MD  Lidocaine 4 % GEL Apply to right toe as needed.  12/23/16  Yes Marcial Pacas, MD  meloxicam (MOBIC) 15 MG tablet Take 1 tablet (15 mg total) by mouth daily. Patient taking differently: Take 15 mg by mouth at bedtime.  04/17/17  Yes Marcial Pacas, MD  methocarbamol (ROBAXIN) 500 MG tablet Take 1 tablet (500 mg total) by mouth every 8 (eight) hours as needed for muscle spasms. 09/27/17  Yes Jola Schmidt, MD  montelukast (SINGULAIR) 10 MG tablet Take 1 tablet (10 mg total) by mouth at bedtime. 09/17/17  Yes Charlott Rakes, MD  OXcarbazepine (TRILEPTAL) 150 MG tablet Take 1 tablet (150 mg total) by mouth 2 (two) times daily. 04/17/17  Yes Marcial Pacas, MD  Pediatric Multivitamins-Iron (CHILDRENS  MULTI-VITS/IRON PO) Take 1 tablet by mouth at bedtime.   Yes [provider]  pregabalin (LYRICA) 75 MG capsule Take 1 capsule (75 mg total) by mouth 2 (two) times daily. 09/17/17  Yes Charlott Rakes, MD  ibuprofen (ADVIL,MOTRIN) 600 MG tablet Take 1 tablet (600 mg total) by mouth every 6 (six) hours as needed. Patient not taking: Reported on 09/26/2017 07/23/17   Argentina Donovan, PA-C  ranitidine (ZANTAC) 150 MG capsule Take 1 capsule (150 mg total) by mouth 2 (two) times daily. 10/15/17   Argentina Donovan, PA-C  triamcinolone cream (KENALOG) 0.1 % Apply 1 application topically 2 (two) times daily. Prn itching 10/15/17   Argentina Donovan, PA-C     Objective:  EXAM:   Vitals:   10/15/17 0908  BP: 124/73  Pulse: 75  Resp: 18  Temp: 98.4 F (36.9 C)  TempSrc: Oral  SpO2: 98%  Weight: (!) 367 lb (166.5 kg)  Height: 5\' 7"  (1.702 m)    General appearance : A&OX3. NAD. Non-toxic-appearing HEENT: Atraumatic and Normocephalic.  PERRLA. EOM intact.  TM clear B. Mouth-MMM, post pharynx WNL w/o erythema, No PND. Neck: supple, no JVD. No cervical lymphadenopathy. No thyromegaly Chest/Lungs:  Breathing-non-labored, Good air entry bilaterally, breath sounds normal without rales, rhonchi, or wheezing  CVS: S1 S2 regular, no murmurs, gallops, rubs  Extremities: Bilateral Lower Ext shows no edema, both legs are warm to touch with = pulse throughout Neurology:  CN II-XII grossly intact, Non focal.   Psych:  TP linear. J/I WNL. Normal speech. Appropriate eye contact and affect.  Skin:  Probable herald patch R posterior aspect upper arm, then oval erythematous patches ranging 0.5-3cm on B arms, legs, and chest.    Data Review Lab Results  Component Value Date   HGBA1C 5.6 09/17/2017   HGBA1C 6.7 05/26/2017   HGBA1C 6.8 02/18/2017     Assessment & Plan   1. Pityriasis rosea Continue zyrtec daily - ranitidine (ZANTAC) 150 MG capsule; Take 1 capsule (150 mg total) by mouth 2 (two)  times daily.  Dispense: 60 capsule; Refill: 1 - triamcinolone cream (KENALOG) 0.1 %; Apply 1 application topically 2 (two) times daily. Prn itching  Dispense: 45 g; Refill: 1  2. Type 2 diabetes mellitus with other neurologic complication, without long-term current use of insulin (HCC) Uncontrolled-continue current regimen and work on diet - Glucose (CBG)  3. Rash - [place] spotted fvr ab, IgM-blood - Lyme Disease Abs IgG, IgM, IFA, CSF - CBC with Differential/Platelet  4. Encounter for examination following treatment at hospital Improved/no further HA     Patient have been counseled extensively about nutrition and exercise  Return for keep 12/22/2017 appt with Dr Margarita Rana.  The patient was given clear instructions to go to ER or return to medical center if  symptoms don't improve, worsen or new problems develop. The patient verbalized understanding. The patient was told to call to get lab results if they haven't heard anything in the next week.     Freeman Caldron, PA-C [facility] and [facility] [place], [place]   10/15/2017, 9:28 AM

## 2017-10-17 ENCOUNTER — Telehealth: Payer: Self-pay | Admitting: *Deleted

## 2017-10-17 LAB — CBC WITH DIFFERENTIAL/PLATELET
BASOS: 1 %
Basophils Absolute: 0 10*3/uL (ref 0.0–0.2)
EOS (ABSOLUTE): 0.1 10*3/uL (ref 0.0–0.4)
EOS: 4 %
HEMATOCRIT: 37.2 % (ref 34.0–46.6)
HEMOGLOBIN: 12 g/dL (ref 11.1–15.9)
IMMATURE GRANS (ABS): 0 10*3/uL (ref 0.0–0.1)
Immature Granulocytes: 0 %
LYMPHS: 28 %
Lymphocytes Absolute: 0.7 10*3/uL (ref 0.7–3.1)
MCH: 25.2 pg — AB (ref 26.6–33.0)
MCHC: 32.3 g/dL (ref 31.5–35.7)
MCV: 78 fL — ABNORMAL LOW (ref 79–97)
Monocytes Absolute: 0.2 10*3/uL (ref 0.1–0.9)
Monocytes: 9 %
NEUTROS ABS: 1.6 10*3/uL (ref 1.4–7.0)
Neutrophils: 58 %
Platelets: 87 10*3/uL — CL (ref 150–450)
RBC: 4.76 x10E6/uL (ref 3.77–5.28)
WBC: 2.7 10*3/uL — ABNORMAL LOW (ref 3.4–10.8)

## 2017-10-17 LAB — ROCKY MTN SPOTTED FVR AB, IGM-BLOOD: RMSF IgM: 0.44 index (ref 0.00–0.89)

## 2017-10-17 LAB — LYME, IGM, EARLY TEST/REFLEX: LYME DISEASE AB, QUANT, IGM: <0.8 index (ref 0.00–0.79)

## 2017-10-17 NOTE — Telephone Encounter (Signed)
-----   Message from Argentina Donovan, [place] sent at 10/17/2017  2:09 PM EDT ----- Your tests for tick fever/lyme disease are negative.  Your blood count is consistent with a viral illness which would also be consistent with pityriasis rosea causing your rash.  Your platelets are low.  Avoid alcohol and we can recheck this at your next appt.  Thanks, Freeman Caldron, PA-C

## 2017-10-17 NOTE — Telephone Encounter (Signed)
Patient verified DOB Patient is aware of lyme/tick disease being negative. Patient is aware of WBC and platelets being low which is consistent with the pityriasis and viral illness. Patient will follow up as planned.

## 2017-10-20 ENCOUNTER — Ambulatory Visit: Payer: Medicaid Other | Admitting: Neurology

## 2017-10-20 ENCOUNTER — Encounter: Payer: Self-pay | Admitting: Neurology

## 2017-10-20 ENCOUNTER — Telehealth: Payer: Self-pay | Admitting: *Deleted

## 2017-10-20 NOTE — Telephone Encounter (Signed)
Patient called to cancel her appt the same day it was scheduled.  Reported having problems with her eyes and not being able to drive.

## 2017-10-22 ENCOUNTER — Encounter: Payer: Self-pay | Admitting: Neurology

## 2017-10-22 ENCOUNTER — Ambulatory Visit: Payer: Medicaid Other | Admitting: Neurology

## 2017-10-22 VITALS — BP 145/79 | HR 85 | Ht 67.0 in | Wt 369.0 lb

## 2017-10-22 DIAGNOSIS — F330 Major depressive disorder, recurrent, mild: Secondary | ICD-10-CM

## 2017-10-22 DIAGNOSIS — M21371 Foot drop, right foot: Secondary | ICD-10-CM | POA: Diagnosis not present

## 2017-10-22 DIAGNOSIS — E1149 Type 2 diabetes mellitus with other diabetic neurological complication: Secondary | ICD-10-CM

## 2017-10-22 MED ORDER — PREGABALIN 75 MG PO CAPS: 75.0000 mg | ORAL_CAPSULE | Freq: Two times a day (BID) | ORAL | 5 refills | Status: DC

## 2017-10-22 MED ORDER — DULOXETINE HCL 60 MG PO CPEP: 60.0000 mg | ORAL_CAPSULE | Freq: Every day | ORAL | 4 refills | Status: DC

## 2017-10-22 NOTE — Progress Notes (Signed)
PATIENT: Janet [name] DOB: 06/09/1970  Chief Complaint  Patient presents with  . Right foot drop/Peroneal neuropathy    Reports only mild improvement in her right foot drop.  She wears an AFO brace when she is going to be walking for any extended amount of time.  She has completed PT.  She is taking duloxetine 60mg  daily and Trileptal 150mg  BID.  She has stopped using methocarbamol.  She ran out of Lyrica about two weeks ago.  She can tell the medication was helpful now that she is not taking it (slightly worse pain).     HISTORICAL  Janet [name], is a 47 year old female, seen in refer by her primary care doctor  Arnoldo Morale, for evaluation of right foot drop, initial evaluation was on November twelfth 2018.  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  She noticed that the top of right toe went numb since April 2018, she described right toe numbness tingling, radiating paresthesia to the top of right foot, right lateral leg, also complains of low back pain, radiating pain to her right leg, getting worse after standing up since July 2018, she denies bowel and bladder incontinence, no left leg involvement,  I personally reviewed x-ray in October 2018, degenerative changes of  lumbar spine with scoliosis concave to the left, no acute abnormality,  Laboratory evaluation in October 2018, glucose was 105, creatinine 0.7, lipid profile, cholesterol 131, LDL 50, normal CMP with creatinine of 0.69, decreased TSH 0.17, A1c 6.2, uric acid 4.7, CPK 78  Update April 17, 2017: MRI of lumbar in December 2018 showed mild degenerative changes, no significant stenosis, or nerve root compression  MRI of right thigh showed no significant abnormality, no evidence of nerve damage.  PET scan October 2018 no evidence of hypermetabolic recurrent, metastatic disease, retroperitoneal adenopathy persistent, remains suspicious based on interval progression since May 2018  CT abdomen in February 2019, similar appearing aortocaval lymph nodes when compared to recent scan, metastatic disease to this location is not excluded, recommended follow-up scan, Hepatic cirrhosis, portal vein hypertension with splenomegaly,upper abdominal collateral vessels.  EMG nerve conduction study noticed significant active denervation at right tibialis anterior, peroneal longus, mild involvement of right biceps femoris short head.  With well preserved bilateral sural, and superficial peroneal sensory potentials, which raised the possibility of right sciatic neuropathy versus right lumbosacral radiculopathy.  She reported 25% improved, right foot is getting stronger, she still has low back pain, peritoneal pain.  UPDATE Sept 11 2019: She is still have significant right distal leg weakness, wear right ankle brace for long distance, taking Trileptal 150 mg twice a day, Cymbalta 60 mg twice a day, run out of Lyrica 75 twice a day, realize it has helped her right lower extremity deep achy pain,  she will have follow-up appointment with her oncologist Dr. Denman George in November, and planning will have a repeat CT  abdomen in November 2019,   She is planning on to have bariatric surgery for weight loss in October 2019  REVIEW  OF SYSTEMS: Full 14 system review of systems performed and notable only for numbness, weakness, joint pain, achy muscles, constipations.  ALLERGIES: Allergies  Allergen Reactions  . Ciprofloxacin Shortness Of Breath  . Shrimp [Shellfish Allergy] Other (See Comments)    positive allergy test    HOME MEDICATIONS: Current Outpatient Medications  Medication Sig Dispense Refill  . albuterol (PROVENTIL HFA;VENTOLIN HFA) 108 (90 Base) MCG/ACT inhaler Inhale 1-2 puffs into the lungs every 6 (six) hours as needed for wheezing or shortness of breath. 54 g 3  . albuterol (PROVENTIL) (2.5 MG/3ML) 0.083% nebulizer solution Take 3 mLs (2.5 mg total) by nebulization every 6 (six) hours as needed for wheezing or shortness of breath. 75 mL 3  . cetirizine (ZYRTEC) 10 MG tablet Take 10 mg by mouth daily.    . cholecalciferol (VITAMIN D) 1000 units tablet Take 1,000 Units by mouth at bedtime.    . DULoxetine (CYMBALTA) 60 MG capsule Take 1 capsule (60 mg total) by mouth daily. (Patient taking differently: Take 60 mg by mouth at bedtime. ) 90 capsule 1  . Fluticasone-Salmeterol (ADVAIR DISKUS) 250-50 MCG/DOSE AEPB Inhale 1 puff into the lungs 2 (two) times daily. (Patient taking differently: Inhale 1 puff into the lungs as needed (allergies). ) 180 each 1  . ibuprofen (ADVIL,MOTRIN) 600 MG tablet Take 1 tablet (600 mg total) by mouth every 6 (six) hours as needed. 30 tablet 1  . levothyroxine (SYNTHROID, LEVOTHROID) 175 MCG tablet Take 1 tablet (175 mcg total) by mouth daily before breakfast. 90 tablet 1  . Lidocaine 4 % GEL Apply to right toe as needed. 10 g 11  . meloxicam (MOBIC) 15 MG tablet Take 1 tablet (15 mg total) by mouth daily. (Patient taking differently: Take 15 mg by mouth at bedtime. ) 30 tablet 6  . montelukast (SINGULAIR) 10 MG tablet Take 1 tablet (10 mg total) by mouth at bedtime. 90 tablet 1  . OXcarbazepine (TRILEPTAL) 150 MG tablet Take 1 tablet (150 mg total) by mouth 2 (two) times daily.  180 tablet 4  . Pediatric Multivitamins-Iron (CHILDRENS MULTI-VITS/IRON PO) Take 1 tablet by mouth at bedtime.    . pregabalin (LYRICA) 75 MG capsule Take 1 capsule (75 mg total) by mouth 2 (two) times daily. 60 capsule 3  . ranitidine (ZANTAC) 150 MG capsule Take 1 capsule (150 mg total) by mouth 2 (two) times daily. 60 capsule 1  . triamcinolone cream (KENALOG) 0.1 % Apply 1 application topically 2 (two) times daily. Prn itching 45 g 1   No current facility-administered medications for this visit.     PAST MEDICAL HISTORY: Past Medical History:  Diagnosis Date  . Anemia   . Anxiety   . Asthma   . Depression   . Hypothyroidism   . Pre-diabetes   . Right foot drop   . Sleep apnea   . Thyroid disease   . Uterine cancer ([place])     PAST SURGICAL HISTORY: Past Surgical History:  Procedure Laterality Date  . ABDOMINAL HYSTERECTOMY    . CARPAL TUNNEL RELEASE    . CESAREAN SECTION    . CHOLECYSTECTOMY    . cyst removal     uterus  . DILATION AND CURETTAGE OF UTERUS     still birth  . DILATION AND CURETTAGE OF UTERUS N/A 06/11/2016   Procedure: DILATATION AND CURETTAGE;  Surgeon:  Emily Filbert, MD;  Location: [facility];  Service: Gynecology;  Laterality: N/A;  . ROBOTIC ASSISTED TOTAL HYSTERECTOMY WITH BILATERAL SALPINGO OOPHERECTOMY Bilateral 07/09/2016   Procedure: XI ROBOTIC ASSISTED TOTAL HYSTERECTOMY WITH BILATERAL SALPINGO OOPHORECTOMY FOR UTERUS GREATER THAN 250 GRAMS, LYSIS OF ADHESIONS;  Surgeon: Everitt Amber, MD;  Location: [facility];  Service: Gynecology;  Laterality: Bilateral;  . SINUS EXPLORATION    . WISDOM TOOTH EXTRACTION      FAMILY HISTORY: Family History  Problem Relation Age of Onset  . Heart attack Mother   . Suicidality Father     SOCIAL HISTORY:  Social History   Socioeconomic History  . Marital status: Widowed    Spouse name: Not on file  . Number of children: 1  . Years of education: college  . Highest education level: Not on file  Occupational History   . Occupation: Unemployed  Social Needs  . Financial resource strain: Not on file  . Food insecurity:    Worry: Not on file    Inability: Not on file  . Transportation needs:    Medical: Not on file    Non-medical: Not on file  Tobacco Use  . Smoking status: Never Smoker  . Smokeless tobacco: Never Used  Substance and Sexual Activity  . Alcohol use: No  . Drug use: No  . Sexual activity: Not Currently    Birth control/protection: None  Lifestyle  . Physical activity:    Days per week: Not on file    Minutes per session: Not on file  . Stress: Not on file  Relationships  . Social connections:    Talks on phone: Not on file    Gets together: Not on file    Attends religious service: Not on file    Active member of club or organization: Not on file    Attends meetings of clubs or organizations: Not on file    Relationship status: Not on file  . Intimate partner violence:    Fear of current or ex partner: Not on file    Emotionally abused: Not on file    Physically abused: Not on file    Forced sexual activity: Not on file  Other Topics Concern  . Not on file  Social History Narrative   Lives at home with her son.   Right-handed.   2 cups caffeine per day.     PHYSICAL EXAM   Vitals:   10/22/17 1037  BP: (!) 145/79  Pulse: 85  Weight: (!) 369 lb (167.4 kg)  Height: 5\' 7"  (1.702 m)    Not recorded      Body mass index is 57.79 kg/m.  PHYSICAL EXAMNIATION:  Gen: NAD, conversant, well nourised, obese, well groomed                     Cardiovascular: Regular rate rhythm, no peripheral edema, warm, nontender. Eyes: Conjunctivae clear without exudates or hemorrhage Neck: Supple, no carotid bruits. Pulmonary: Clear to auscultation bilaterally   NEUROLOGICAL EXAM:  MENTAL STATUS: Speech:    Speech is normal; fluent and spontaneous with normal comprehension.  Cognition:     Orientation to time, place and person     Normal recent and remote memory      Normal Attention span and concentration     Normal Language, naming, repeating,spontaneous speech     Fund of knowledge   CRANIAL NERVES: CN II: Visual fields are full to confrontation. Fundoscopic exam is normal with sharp  discs and no vascular changes. Pupils are round equal and briskly reactive to light. CN III, IV, VI: extraocular movement are normal. No ptosis. CN V: Facial sensation is intact to pinprick in all 3 divisions bilaterally. Corneal responses are intact.  CN VII: Face is symmetric with normal eye closure and smile. CN VIII: Hearing is normal to rubbing fingers CN IX, X: Palate elevates symmetrically. Phonation is normal. CN XI: Head turning and shoulder shrug are intact CN XII: Tongue is midline with normal movements and no atrophy.  MOTOR: She has mild right ankle dorsi flexion, eversion weakness, moderate right toe extension weakness.  REFLEXES: Reflexes are 2 and symmetric at the biceps, triceps, knees, and mildly decreased right ankle reflex, but still present. Plantar responses are flexor.  SENSORY:  She has decreased light touch pinprick at right first web space, extending to top of right foot, right lateral leg.   COORDINATION: Rapid alternating movements and fine finger movements are intact. There is no dysmetria on finger-to-nose and heel-knee-shin.    GAIT/STANCE: Antalgic, she has mild gait abnormality, dragging her right leg, has difficulty with right ankle dorsi flexion Romberg is absent.   DIAGNOSTIC DATA (LABS, IMAGING, TESTING) - I reviewed patient records, labs, notes, testing and imaging myself where available.   ASSESSMENT AND PLAN  Bryton Waight is a 47 y.o. female   Right foot drop, sensory loss at right superficial peroneal and deep peroneal distribution  Right common peroneal neuropathy,   MRI of the lumbar showed no significant abnormality,   MRI of the right thigh showed no significant abnormality.  CT abdomen and pelvic region  showed mildly enlarged retroperitoneal lymph node in the aortocaval space, she did have a history of uterine cancer, only required surgery, no chemoradiation therapy, continue under close supervision of her oncologist, planning to have repeat CT abdomen in November 2019  Continue Trileptal 150 mg twice a day, Cymbalta 60 mg a day, Lyrica 75 mg twice a day    Marcial Pacas, M.D. Ph.D.  [facility] [address], [place], [place] [address] Ph: [phone] Fax: [phone]  CC: Arnoldo Morale, MD

## 2017-10-23 ENCOUNTER — Telehealth: Payer: Self-pay | Admitting: Neurology

## 2017-10-23 NOTE — Telephone Encounter (Signed)
Pt called stating there is an error of her paperwork DMV needing paperwork redone.

## 2017-10-23 NOTE — Telephone Encounter (Signed)
She is coming by our office to pick up her permanent handicap placard.  It has been completed, signed by Dr. Krista Blue and placed up front.

## 2017-11-10 IMAGING — DX DG CHEST 2V
2 series · 2 of 2 positions shown · non-contrast
Comparison: Radiograph June 15, 2016.

CLINICAL DATA: Preop for hysterectomy.

EXAM:
CHEST  2 VIEW

[chest pa]
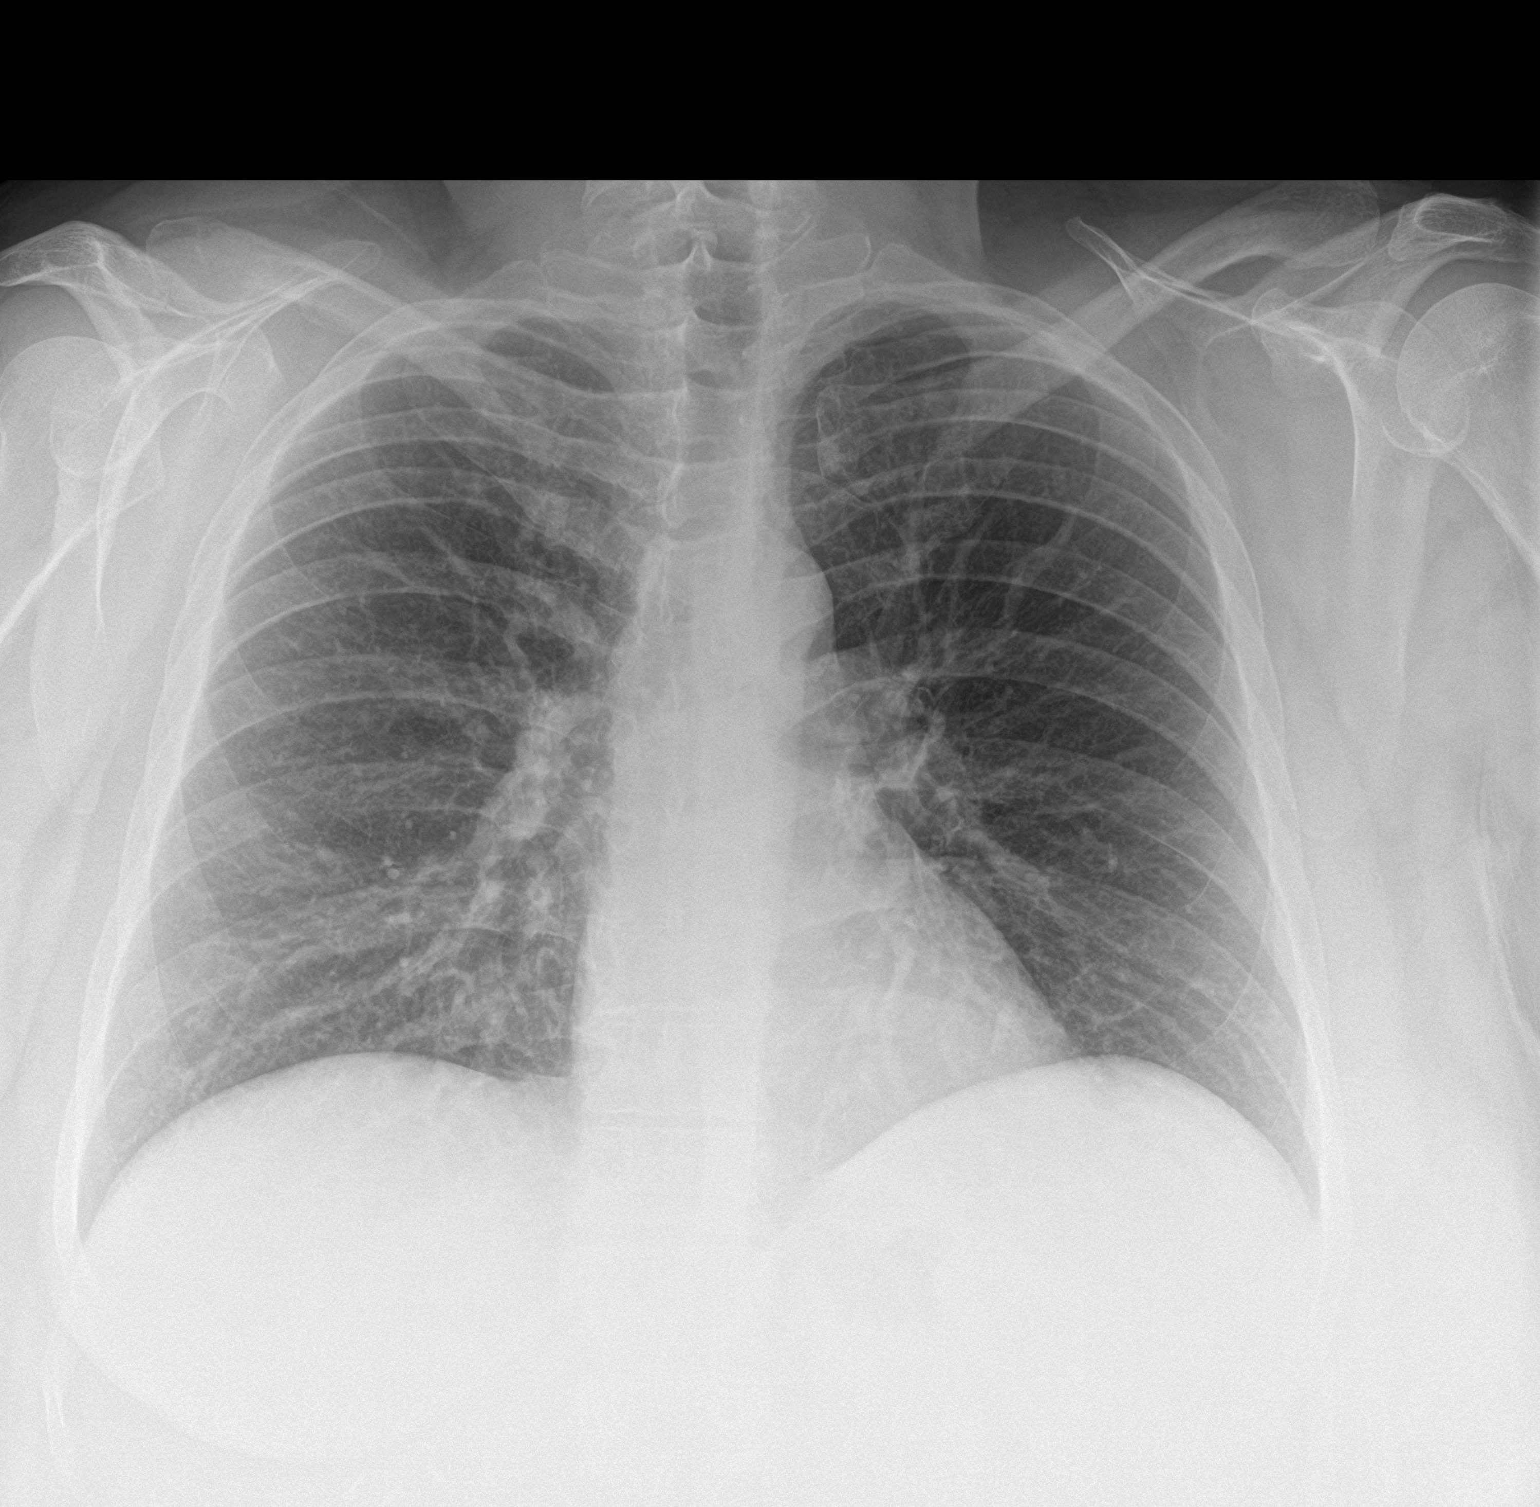

[chest lat]
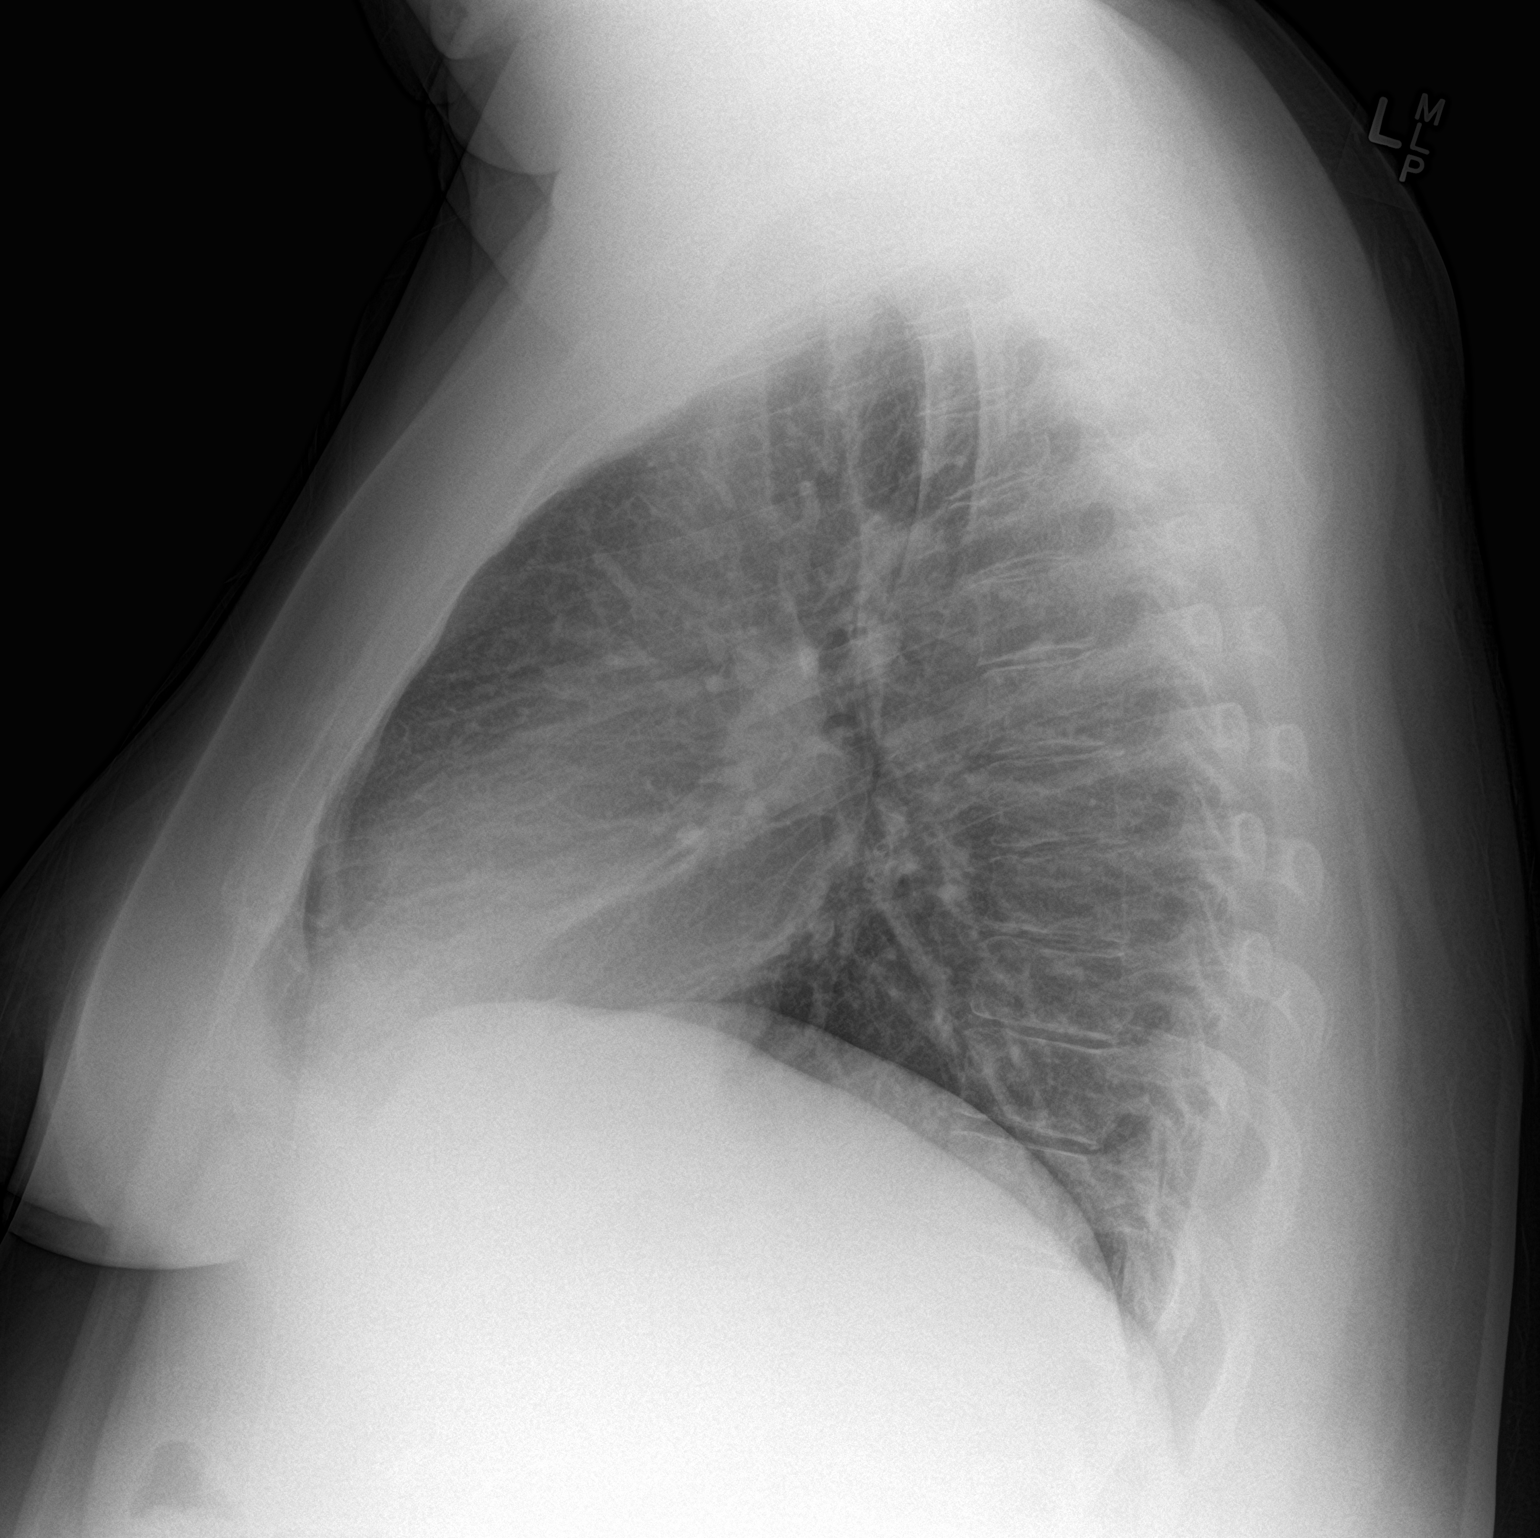

[2 of 2 positions shown; findings below may reference images not displayed]

FINDINGS: The heart size and mediastinal contours are within normal limits.
Both lungs are clear. The visualized skeletal structures are
unremarkable.
IMPRESSION: No active cardiopulmonary disease.

## 2017-11-11 ENCOUNTER — Encounter: Payer: Self-pay | Admitting: Family Medicine

## 2017-11-21 ENCOUNTER — Other Ambulatory Visit: Payer: Self-pay | Admitting: Family Medicine

## 2017-11-21 DIAGNOSIS — E039 Hypothyroidism, unspecified: Secondary | ICD-10-CM

## 2017-12-12 ENCOUNTER — Telehealth: Payer: Self-pay | Admitting: *Deleted

## 2017-12-12 NOTE — Telephone Encounter (Signed)
Called and left the patient a message to call the office back. Need to schedule an appt for lab for the CT scan.

## 2017-12-15 ENCOUNTER — Other Ambulatory Visit: Payer: Self-pay

## 2017-12-15 DIAGNOSIS — C541 Malignant neoplasm of endometrium: Secondary | ICD-10-CM

## 2017-12-16 ENCOUNTER — Ambulatory Visit (HOSPITAL_COMMUNITY)
Admission: RE | Admit: 2017-12-16 | Discharge: 2017-12-16 | Disposition: A | Discharge status: Home / Self Care | Payer: Medicaid Other | Source: Ambulatory Visit | Attending: Gynecologic Oncology | Admitting: Gynecologic Oncology

## 2017-12-16 ENCOUNTER — Encounter (HOSPITAL_COMMUNITY): Payer: Self-pay

## 2017-12-16 ENCOUNTER — Ambulatory Visit (HOSPITAL_COMMUNITY): Payer: Medicaid Other

## 2017-12-16 ENCOUNTER — Inpatient Hospital Stay: Payer: Medicaid Other | Attending: Gynecologic Oncology

## 2017-12-16 DIAGNOSIS — Z90710 Acquired absence of both cervix and uterus: Secondary | ICD-10-CM | POA: Insufficient documentation

## 2017-12-16 DIAGNOSIS — K6289 Other specified diseases of anus and rectum: Secondary | ICD-10-CM | POA: Diagnosis not present

## 2017-12-16 DIAGNOSIS — K5909 Other constipation: Secondary | ICD-10-CM | POA: Insufficient documentation

## 2017-12-16 DIAGNOSIS — R590 Localized enlarged lymph nodes: Secondary | ICD-10-CM | POA: Diagnosis not present

## 2017-12-16 DIAGNOSIS — C541 Malignant neoplasm of endometrium: Secondary | ICD-10-CM | POA: Diagnosis present

## 2017-12-16 DIAGNOSIS — R161 Splenomegaly, not elsewhere classified: Secondary | ICD-10-CM | POA: Insufficient documentation

## 2017-12-16 DIAGNOSIS — Z90722 Acquired absence of ovaries, bilateral: Secondary | ICD-10-CM | POA: Diagnosis not present

## 2017-12-16 DIAGNOSIS — I868 Varicose veins of other specified sites: Secondary | ICD-10-CM | POA: Diagnosis not present

## 2017-12-16 DIAGNOSIS — I8500 Esophageal varices without bleeding: Secondary | ICD-10-CM | POA: Insufficient documentation

## 2017-12-16 DIAGNOSIS — K7460 Unspecified cirrhosis of liver: Secondary | ICD-10-CM | POA: Insufficient documentation

## 2017-12-16 LAB — BASIC METABOLIC PANEL - CANCER CENTER ONLY
Anion gap: 7 (ref 5–15)
BUN: 8 mg/dL (ref 6–20)
CHLORIDE: 108 mmol/L (ref 98–111)
CO2: 28 mmol/L (ref 22–32)
Calcium: 9 mg/dL (ref 8.9–10.3)
Creatinine: 0.74 mg/dL (ref 0.44–1.00)
GFR, Estimated: >60 mL/min (ref >60)
Glucose, Bld: 127 mg/dL — ABNORMAL HIGH (ref 70–99)
POTASSIUM: 4 mmol/L (ref 3.5–5.1)
SODIUM: 143 mmol/L (ref 135–145)

## 2017-12-16 MED ORDER — SODIUM CHLORIDE 0.9 % IJ SOLN
INTRAMUSCULAR | Status: AC
  Filled 2017-12-16: qty 50

## 2017-12-16 MED ORDER — IOHEXOL 300 MG/ML  SOLN
100.0000 mL | Freq: Once | INTRAMUSCULAR | Status: AC | PRN
  Administered 2017-12-16: 100 mL via INTRAVENOUS

## 2017-12-17 ENCOUNTER — Encounter: Payer: Self-pay | Admitting: Gynecologic Oncology

## 2017-12-17 ENCOUNTER — Inpatient Hospital Stay (HOSPITAL_BASED_OUTPATIENT_CLINIC_OR_DEPARTMENT_OTHER): Payer: Medicaid Other | Admitting: Gynecologic Oncology

## 2017-12-17 VITALS — BP 142/54 | HR 70 | Temp 98.0°F | Resp 20 | Ht 68.5 in | Wt 378.0 lb

## 2017-12-17 DIAGNOSIS — Z90722 Acquired absence of ovaries, bilateral: Secondary | ICD-10-CM | POA: Diagnosis not present

## 2017-12-17 DIAGNOSIS — K6289 Other specified diseases of anus and rectum: Secondary | ICD-10-CM | POA: Diagnosis not present

## 2017-12-17 DIAGNOSIS — R198 Other specified symptoms and signs involving the digestive system and abdomen: Secondary | ICD-10-CM

## 2017-12-17 DIAGNOSIS — C541 Malignant neoplasm of endometrium: Secondary | ICD-10-CM | POA: Diagnosis not present

## 2017-12-17 DIAGNOSIS — Z90710 Acquired absence of both cervix and uterus: Secondary | ICD-10-CM

## 2017-12-17 DIAGNOSIS — K5909 Other constipation: Secondary | ICD-10-CM

## 2017-12-17 NOTE — Progress Notes (Signed)
Follow-up Note: Gyn-Onc  Consult was requested by Dr. [name] for the evaluation of Janet [name] 47 y.o. female  CC:  Chief Complaint  Patient presents with  . Endometrial cancer [facility])    Assessment/Plan:  [name] is a 47 y.o.  with stage IA grade 2 endometrial cancer s/p robotic hysterectomy, BSO on 08/13/48 complicated by post op wound separation.  Endometrial cancer: Pathology revealed low risk factors for recurrence, therefore no adjuvant therapy is recommended according to NCCN guidelines.  I discussed risk for recurrence and typical symptoms encouraged her to notify us of these should they develop between visits.  I recommend she have follow-up every 6 months for 5 years in accordance with NCCN guidelines. Those visits should include symptom assessment, physical exam and pelvic examination. Pap smears are not indicated or recommended in the routine surveillance of endometrial cancer.  Enlarged aortocaval nodes - repeat CT imaging in November, 2020 to re-evaluate for progression. Stable to date.  Will need creatinine assessed closer to date.   Anal pain with defecation. Chronic constipation. She has been seeing GI for this but feels the anal pain is new and bothersome. Not related to endometrial cancer recurrence on exam. Will have her seen by colorectal surgeon to evaluate for potential strategies to help.    HPI: [name] is a 47 y.o.  G2P1 with history of irregular menses.  Janet [name] presented To Dr. [name] in  with a report of bleeding daily for almost a month. Janet [name] was unable to tolerate an office examination and thus she underwent hysteroscopy D&C on 06/11/2016.  Pathology was notable for complex atypical hyperplasia with a few small foci suspicious for early/microscopic adenocarcinoma. She presented to the emergency room on May 6 and was diagnosed with endometritis. She's currently receiving course of Flagyl which will be completed  on 06/23/2016.  There is no family history of gynecologic GI or breast malignancies.    On 07/09/16 she underwent robotic assisted total hysterectomy, BSO for a uterus >250 (336)gm with minilaparotomy for specimen delivery.  Final pathology revealed a 2.4cm grade 2 endometrioid tumor with no myometrial invasion or LVSI. Lymphadenectomy was not performed due to extreme abdominal adiposity (BMI 56) and preoperative preinvasive diagnosis. She was determined to have low risk features in her pathology and therefore no adjuvant therapy was recommended in accordance with NCCN guidelines.  Postop week 1 her minilaparotomy wound opened (no cellulitis) and closed via secondary intention.  On post-op imaging (CT) on 11/21/16 (due to inability to stage because of obesity) she had findings of Interval increase in size of a now mildly enlarged retroperitoneal lymph node in the aortocaval space. Close attention in this region on follow-up recommended as metastatic disease is a concern. 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  PET/CT was ordered and performed on 12/04/16 which showed aortocaval adenopathy measuring 1.5cm. Non-PET avid. The images were reviewed at tumor board and felt to not be likely metastatic disease therefore observation and repeat imaging was recommended.  Interval Hx:  Repeat CT scan on 04/10/17 showed stabel PA nodes (1.6cm largest).  She developed new onset left lower extremity edema in May, 2019. Dopplers negative.  Surveillance CT abd/pelvis in November, 2019 showed nonspecific mild retroperitoneal adenopathy which is stable, the continued stability of these nodes along with the absence of hypermetabolism on PET in 2018 suggest reactive adenopathy, although continued CT surveillance is warranted.  No new potential findings of  metastatic disease in the abdomen or pelvis.  Hepatic cirrhosis was  seen.  Stable moderate splenomegaly and lower paraesophageal periumbilical and perisplenic varices was seen.  She reports increasing anal pain with defecation. She has a history of chronic constipation, hemorrhoids and sees a GI for this. However, the pain with defecation is new.   Review of Systems:  Constitutional  Feels well,   Cardiovascular  No chest pain, shortness of breath, or edema  Pulmonary  No cough or wheeze.  Gastro Intestinal  No nausea, vomitting, or diarrhoea. No bright red blood per rectum, no abdominal pain, no change in bowel movement, +constipation + anal pain with defecation.  Genito Urinary  No frequency, urgency, dysuria, vaginal spotting Musculo Skeletal  No myalgia, arthralgia, joint swelling or pain  Neurologic  No weakness, numbness, change in gait,  Psychology  No depression, anxiety from diagnoses and social situation  Current Meds:  Outpatient Encounter Medications as of 12/17/2017  Medication Sig  . albuterol (PROVENTIL HFA;VENTOLIN HFA) 108 (90 Base) MCG/ACT inhaler Inhale 1-2 puffs into the lungs every 6 (six) hours as needed for wheezing or shortness of breath.  Marland Kitchen albuterol (PROVENTIL) (2.5 MG/3ML) 0.083% nebulizer solution Take 3 mLs (2.5 mg total) by nebulization every 6 (six) hours as needed for wheezing or shortness of breath.  . cetirizine (ZYRTEC) 10 MG tablet Take 10 mg by mouth daily.  . cholecalciferol (VITAMIN D) 1000 units tablet Take 1,000 Units by mouth at bedtime.  . DULoxetine (CYMBALTA) 60 MG capsule Take 1 capsule (60 mg total) by mouth daily.  . Fluticasone-Salmeterol (ADVAIR DISKUS) 250-50 MCG/DOSE AEPB Inhale 1 puff into the lungs 2 (two) times daily. (Patient taking differently: Inhale 1 puff into the lungs as needed (allergies). )  . ibuprofen (ADVIL,MOTRIN) 600 MG tablet Take 1 tablet (600 mg total) by mouth every 6 (six) hours as needed.  Marland Kitchen levothyroxine (SYNTHROID, LEVOTHROID) 175 MCG tablet TAKE 1 TABLET BY MOUTH ONCE DAILY  BEFORE BREAKFAST  . Lidocaine 4 % GEL Apply to right toe as needed.  . meloxicam (MOBIC) 15 MG tablet Take 1 tablet (15 mg total) by mouth daily. (Patient taking differently: Take 15 mg by mouth at bedtime. )  . montelukast (SINGULAIR) 10 MG tablet Take 1 tablet (10 mg total) by mouth at bedtime.  . OXcarbazepine (TRILEPTAL) 150 MG tablet Take 1 tablet (150 mg total) by mouth 2 (two) times daily.  . Pediatric Multivitamins-Iron (CHILDRENS MULTI-VITS/IRON PO) Take 1 tablet by mouth at bedtime.  . pregabalin (LYRICA) 75 MG capsule Take 1 capsule (75 mg total) by mouth 2 (two) times daily.  Marland Kitchen triamcinolone cream (KENALOG) 0.1 % Apply 1 application topically 2 (two) times daily. Prn itching  . [DISCONTINUED] ranitidine (ZANTAC) 150 MG capsule Take 1 capsule (150 mg total) by mouth 2 (two) times daily. (Patient not taking: Reported on 12/17/2017)   No facility-administered encounter medications on file as of 12/17/2017.     Allergy:  Allergies  Allergen Reactions  . Ciprofloxacin Shortness Of Breath  . Shrimp [Shellfish Allergy] Other (See Comments)    positive allergy test    Social Hx:   Social History   Socioeconomic History  . Marital status: Widowed    Spouse name: Not on file  . Number of children: 1  . Years of education: college  . Highest education level: Not on file  Occupational History  . Occupation: Unemployed  Social Needs  . Financial resource strain: Not on file  . Food insecurity:  Worry: Not on file    Inability: Not on file  . Transportation needs:    Medical: Not on file    Non-medical: Not on file  Tobacco Use  . Smoking status: Never Smoker  . Smokeless tobacco: Never Used  Substance and Sexual Activity  . Alcohol use: No  . Drug use: No  . Sexual activity: Not Currently    Birth control/protection: None  Lifestyle  . Physical activity:    Days per week: Not on file    Minutes per session: Not on file  . Stress: Not on file  Relationships  .  Social connections:    Talks on phone: Not on file    Gets together: Not on file    Attends religious service: Not on file    Active member of club or organization: Not on file    Attends meetings of clubs or organizations: Not on file    Relationship status: Not on file  . Intimate partner violence:    Fear of current or ex partner: Not on file    Emotionally abused: Not on file    Physically abused: Not on file    Forced sexual activity: Not on file  Other Topics Concern  . Not on file  Social History Narrative   Lives at home with her son.   Right-handed.   2 cups caffeine per day.   Husband was diagnosed with leukemia in January 2018 with a life expectancy of approximately 6 months. At this time he is receiving supportive care and regularly is managed with blood and platelet transfusions.  Regular home nursing visits are in place.   Keirsten Matuska denies history of sexual or physical abuse.  Past Surgical Hx:  Past Surgical History:  Procedure Laterality Date  . ABDOMINAL HYSTERECTOMY    . CARPAL TUNNEL RELEASE    . CESAREAN SECTION    . CHOLECYSTECTOMY    . cyst removal     uterus  . DILATION AND CURETTAGE OF UTERUS     still birth  . DILATION AND CURETTAGE OF UTERUS N/A 06/11/2016   Procedure: DILATATION AND CURETTAGE;  Surgeon: Emily Filbert, MD;  Location: [facility];  Service: Gynecology;  Laterality: N/A;  . ROBOTIC ASSISTED TOTAL HYSTERECTOMY WITH BILATERAL SALPINGO OOPHERECTOMY Bilateral 07/09/2016   Procedure: XI ROBOTIC ASSISTED TOTAL HYSTERECTOMY WITH BILATERAL SALPINGO OOPHORECTOMY FOR UTERUS GREATER THAN 250 GRAMS, LYSIS OF ADHESIONS;  Surgeon: Everitt Amber, MD;  Location: [facility];  Service: Gynecology;  Laterality: Bilateral;  . SINUS EXPLORATION    . WISDOM TOOTH EXTRACTION      Past Medical Hx:  Past Medical History:  Diagnosis Date  . Anemia   . Anxiety   . Asthma   . Depression   . Hypothyroidism   . Pre-diabetes   . Right foot drop   . Sleep apnea    . Thyroid disease   . Uterine cancer [facility])     Past Gynecological History:  Gravida 2 para 1 menarche at 30 with irregular menses since last Pap 06/11/2016 within normal limits no history of abnormal Pap test reports history of OCP use in her 60s  Family Hx:  Family History  Problem Relation Age of Onset  . Heart attack Mother   . Suicidality Father     Vitals:  Blood pressure (!) 142/54, pulse 70, temperature 98 F (36.7 C), temperature source Oral, resp. rate 20, height 5' 8.5" (1.74 m), weight (!) 378 lb (171.5 kg), SpO2 100 %.  Body mass index is 56.64 kg/m. Wt Readings from Last 3 Encounters:  12/17/17 (!) 378 lb (171.5 kg)  10/22/17 (!) 369 lb (167.4 kg)  10/15/17 (!) 367 lb (166.5 kg)    Physical Exam: WD in NAD Neck  Supple NROM, without any enlargements.  Lymph Node Survey No cervical supraclavicular or inguinal adenopathy Cardiovascular  Pulse normal rate, regularity and rhythm. S1 and S2 normal.  Lungs  Clear to auscultation bilaterally, without wheezes/crackles/rhonchi. Good air movement.  Skin  No rash/lesions/breakdown  Psychiatry  Alert and oriented appropriate mood affect speech and reasoning. Abdomen  Normoactive bowel sounds, abdomen soft, non-tender. large pannus, incision closed and no evidence of hernia Back No CVA tenderness Genito Urinary  : normal vaginal cuff - no palpable masses or cuff lesions. Rectal  No palpable masses. Tenderness anteriorally of the anal sphincter but no discrete lesions or hemorrhoids appreciated.  Extremities  + left lower extremity 2+, no tenderness.  Thereasa Solo, MD, PhD 12/17/2017, 2:11 PM

## 2017-12-17 NOTE — Patient Instructions (Signed)
Please notify Dr Denman George at phone number [phone] if you notice vaginal bleeding, new pelvic or abdominal pains, bloating, feeling full easy, or a change in bladder or bowel function.   Please return to see Dr Denman George in 3 months as scheduled.  Dr Denman George has scheduled you to see a colorectal surgeon at [facility] to discuss the symptoms of anal pain with defecation.

## 2017-12-18 ENCOUNTER — Telehealth: Payer: Self-pay | Admitting: *Deleted

## 2017-12-18 NOTE — Telephone Encounter (Signed)
Fax referral sheet and records to [facility]

## 2017-12-22 ENCOUNTER — Ambulatory Visit: Payer: Medicaid Other | Admitting: Family Medicine

## 2017-12-24 ENCOUNTER — Ambulatory Visit: Payer: Medicaid Other

## 2017-12-29 ENCOUNTER — Ambulatory Visit: Payer: Medicaid Other | Admitting: Family Medicine

## 2018-01-19 ENCOUNTER — Other Ambulatory Visit: Payer: Self-pay | Admitting: Neurology

## 2018-01-21 ENCOUNTER — Encounter: Payer: Self-pay | Admitting: Family Medicine

## 2018-01-21 ENCOUNTER — Ambulatory Visit: Payer: Medicaid Other | Attending: Family Medicine | Admitting: Family Medicine

## 2018-01-21 VITALS — BP 168/80 | HR 72 | Temp 97.6°F | Ht 68.5 in | Wt 383.2 lb

## 2018-01-21 DIAGNOSIS — Z9889 Other specified postprocedural states: Secondary | ICD-10-CM | POA: Insufficient documentation

## 2018-01-21 DIAGNOSIS — Z7951 Long term (current) use of inhaled steroids: Secondary | ICD-10-CM | POA: Diagnosis not present

## 2018-01-21 DIAGNOSIS — F419 Anxiety disorder, unspecified: Secondary | ICD-10-CM | POA: Diagnosis not present

## 2018-01-21 DIAGNOSIS — Z7952 Long term (current) use of systemic steroids: Secondary | ICD-10-CM | POA: Insufficient documentation

## 2018-01-21 DIAGNOSIS — Z791 Long term (current) use of non-steroidal anti-inflammatories (NSAID): Secondary | ICD-10-CM | POA: Diagnosis not present

## 2018-01-21 DIAGNOSIS — Z6841 Body Mass Index (BMI) 40.0 and over, adult: Secondary | ICD-10-CM | POA: Diagnosis not present

## 2018-01-21 DIAGNOSIS — E1149 Type 2 diabetes mellitus with other diabetic neurological complication: Secondary | ICD-10-CM

## 2018-01-21 DIAGNOSIS — M545 Low back pain, unspecified: Secondary | ICD-10-CM

## 2018-01-21 DIAGNOSIS — F329 Major depressive disorder, single episode, unspecified: Secondary | ICD-10-CM | POA: Insufficient documentation

## 2018-01-21 DIAGNOSIS — Z90710 Acquired absence of both cervix and uterus: Secondary | ICD-10-CM | POA: Diagnosis not present

## 2018-01-21 DIAGNOSIS — G4730 Sleep apnea, unspecified: Secondary | ICD-10-CM | POA: Diagnosis not present

## 2018-01-21 DIAGNOSIS — Z9049 Acquired absence of other specified parts of digestive tract: Secondary | ICD-10-CM | POA: Diagnosis not present

## 2018-01-21 DIAGNOSIS — Z8542 Personal history of malignant neoplasm of other parts of uterus: Secondary | ICD-10-CM | POA: Diagnosis not present

## 2018-01-21 DIAGNOSIS — Z79890 Hormone replacement therapy (postmenopausal): Secondary | ICD-10-CM | POA: Insufficient documentation

## 2018-01-21 DIAGNOSIS — J4540 Moderate persistent asthma, uncomplicated: Secondary | ICD-10-CM

## 2018-01-21 DIAGNOSIS — I10 Essential (primary) hypertension: Secondary | ICD-10-CM

## 2018-01-21 DIAGNOSIS — E1140 Type 2 diabetes mellitus with diabetic neuropathy, unspecified: Secondary | ICD-10-CM | POA: Diagnosis not present

## 2018-01-21 DIAGNOSIS — Z79899 Other long term (current) drug therapy: Secondary | ICD-10-CM | POA: Insufficient documentation

## 2018-01-21 DIAGNOSIS — E039 Hypothyroidism, unspecified: Secondary | ICD-10-CM | POA: Diagnosis not present

## 2018-01-21 DIAGNOSIS — J302 Other seasonal allergic rhinitis: Secondary | ICD-10-CM

## 2018-01-21 DIAGNOSIS — E119 Type 2 diabetes mellitus without complications: Secondary | ICD-10-CM | POA: Diagnosis present

## 2018-01-21 LAB — POCT GLYCOSYLATED HEMOGLOBIN (HGB A1C): HbA1c, POC (controlled diabetic range): 5.8 % (ref 0.0–7.0)

## 2018-01-21 LAB — GLUCOSE, POCT (MANUAL RESULT ENTRY): POC Glucose: 149 mg/dl — AB (ref 70–99)

## 2018-01-21 MED ORDER — CYCLOBENZAPRINE HCL 10 MG PO TABS: 10.0000 mg | ORAL_TABLET | Freq: Two times a day (BID) | ORAL | 2 refills | Status: DC | PRN

## 2018-01-21 MED ORDER — FLUTICASONE-SALMETEROL 250-50 MCG/DOSE IN AEPB: 1.0000 | INHALATION_SPRAY | Freq: Two times a day (BID) | RESPIRATORY_TRACT | 1 refills | Status: DC

## 2018-01-21 MED ORDER — CETIRIZINE HCL 10 MG PO TABS: 10.0000 mg | ORAL_TABLET | Freq: Every day | ORAL | 3 refills | Status: DC

## 2018-01-21 MED ORDER — ALBUTEROL SULFATE HFA 108 (90 BASE) MCG/ACT IN AERS: 1.0000 | INHALATION_SPRAY | Freq: Four times a day (QID) | RESPIRATORY_TRACT | 3 refills | Status: DC | PRN

## 2018-01-21 MED ORDER — LISINOPRIL 5 MG PO TABS: 5.0000 mg | ORAL_TABLET | Freq: Every day | ORAL | 6 refills | Status: DC

## 2018-01-21 MED ORDER — KETOROLAC TROMETHAMINE 60 MG/2ML IM SOLN
60.0000 mg | Freq: Once | INTRAMUSCULAR | Status: AC
  Administered 2018-01-21: 60 mg via INTRAMUSCULAR

## 2018-01-21 MED ORDER — FLUTICASONE PROPIONATE 50 MCG/ACT NA SUSP: 2.0000 | Freq: Every day | NASAL | 6 refills | Status: DC

## 2018-01-21 NOTE — Patient Instructions (Signed)

## 2018-01-21 NOTE — Progress Notes (Signed)
Subjective:  Patient ID: Janet [name], female    DOB: 1970-08-25  Age: 47 y.o. MRN: [id number]  CC: Diabetes and Back Pain   HPI Janet [name] is a 47 year old female with a history of type 2 diabetes mellitus (diet controlled with A1c of 5.8), Diabetic neuropathy, hypothyroidism, asthma, Seasonal allergies, endometrial adenocarcinoma (s/pTotal abdominal hysterectomy and bilateral salpingo-oophorectomy in 06/2016, adjuvant treatment due to low risk for recurrence),  morbid obesity who presents today for follow-up visit. She complains of left-sided low back pain which is an 8-9 out of 10 with no relieving or aggravating factors and has been present for the last 2 weeks.  She denies any preceding trauma or preceding heavy lifting.  Denies numbness in her extremities. Her diabetic neuropathy has been controlled on Lyrica which she has been taking along with meloxicam for her right foot drop as well which is currently stable.  She last saw her neurologist 3 months ago. Currently being followed by the bariatric clinic for possible bariatric surgery but this has been on hold as she is yet to obtain the funds to register for the nutrition visits which is a prerequisite to surgery. Seen by Dr. Denman George of GYN oncology last month with follow-up every 6 months recommended. Her blood pressure is elevated and she is currently not on an antihypertensive; it was also elevated at her GYN oncology visit. With regards to her asthma she previously did not need her Advair but has started using it again ever since the weather changed.  Denies wheezing, dyspnea, chest pains.  Past Medical History:  Diagnosis Date  . Anemia   . Anxiety   . Asthma   . Depression   . Hypothyroidism   . Pre-diabetes   . Right foot drop   . Sleep apnea   . Thyroid disease   . Uterine cancer [facility])     Past Surgical History:  Procedure Laterality Date  . ABDOMINAL HYSTERECTOMY    . CARPAL TUNNEL RELEASE    . CESAREAN  SECTION    . CHOLECYSTECTOMY    . cyst removal     uterus  . DILATION AND CURETTAGE OF UTERUS     still birth  . DILATION AND CURETTAGE OF UTERUS N/A 06/11/2016   Procedure: DILATATION AND CURETTAGE;  Surgeon: Emily Filbert, MD;  Location: [place] [facility];  Service: Gynecology;  Laterality: N/A;  . ROBOTIC ASSISTED TOTAL HYSTERECTOMY WITH BILATERAL SALPINGO OOPHERECTOMY Bilateral 07/09/2016   Procedure: XI ROBOTIC ASSISTED TOTAL HYSTERECTOMY WITH BILATERAL SALPINGO OOPHORECTOMY FOR UTERUS GREATER THAN 250 GRAMS, LYSIS OF ADHESIONS;  Surgeon: Everitt Amber, MD;  Location: [facility];  Service: Gynecology;  Laterality: Bilateral;  . SINUS EXPLORATION    . WISDOM TOOTH EXTRACTION       Outpatient Medications Prior to Visit  Medication Sig Dispense Refill  . albuterol (PROVENTIL) (2.5 MG/3ML) 0.083% nebulizer solution Take 3 mLs (2.5 mg total) by nebulization every 6 (six) hours as needed for wheezing or shortness of breath. 75 mL 3  . cholecalciferol (VITAMIN D) 1000 units tablet Take 1,000 Units by mouth at bedtime.    . DULoxetine (CYMBALTA) 60 MG capsule Take 1 capsule (60 mg total) by mouth daily. 90 capsule 4  . levothyroxine (SYNTHROID, LEVOTHROID) 175 MCG tablet TAKE 1 TABLET BY MOUTH ONCE DAILY BEFORE BREAKFAST 90 tablet 0  . Lidocaine 4 % GEL Apply to right toe as needed. 10 g 11  . meloxicam (MOBIC) 15 MG tablet Take 1 tablet (15 mg total) by mouth daily. (  Patient taking differently: Take 15 mg by mouth at bedtime. ) 30 tablet 6  . montelukast (SINGULAIR) 10 MG tablet Take 1 tablet (10 mg total) by mouth at bedtime. 90 tablet 1  . OXcarbazepine (TRILEPTAL) 150 MG tablet Take 1 tablet (150 mg total) by mouth 2 (two) times daily. 180 tablet 4  . Pediatric Multivitamins-Iron (CHILDRENS MULTI-VITS/IRON PO) Take 1 tablet by mouth at bedtime.    . pregabalin (LYRICA) 75 MG capsule Take 1 capsule (75 mg total) by mouth 2 (two) times daily. 60 capsule 5  . triamcinolone cream (KENALOG) 0.1 % Apply 1  application topically 2 (two) times daily. Prn itching 45 g 1  . albuterol (PROVENTIL HFA;VENTOLIN HFA) 108 (90 Base) MCG/ACT inhaler Inhale 1-2 puffs into the lungs every 6 (six) hours as needed for wheezing or shortness of breath. 54 g 3  . cetirizine (ZYRTEC) 10 MG tablet Take 10 mg by mouth daily.    . Fluticasone-Salmeterol (ADVAIR DISKUS) 250-50 MCG/DOSE AEPB Inhale 1 puff into the lungs 2 (two) times daily. (Patient taking differently: Inhale 1 puff into the lungs as needed (allergies). ) 180 each 1  . ibuprofen (ADVIL,MOTRIN) 600 MG tablet Take 1 tablet (600 mg total) by mouth every 6 (six) hours as needed. (Patient not taking: Reported on 01/21/2018) 30 tablet 1   No facility-administered medications prior to visit.     ROS Review of Systems  Constitutional: Negative for activity change, appetite change and fatigue.  HENT: Negative for congestion, sinus pressure and sore throat.   Eyes: Negative for visual disturbance.  Respiratory: Negative for cough, chest tightness, shortness of breath and wheezing.   Cardiovascular: Negative for chest pain and palpitations.  Gastrointestinal: Negative for abdominal distention, abdominal pain and constipation.  Endocrine: Negative for polydipsia.  Genitourinary: Negative for dysuria and frequency.  Musculoskeletal: Positive for back pain. Negative for arthralgias.  Skin: Negative for rash.  Neurological: Negative for tremors, light-headedness and numbness.  Hematological: Does not bruise/bleed easily.  Psychiatric/Behavioral: Negative for agitation and behavioral problems.    Objective:  BP (!) 168/80   Pulse 72   Temp 97.6 F (36.4 C) (Oral)   Ht 5' 8.5" (1.74 m)   Wt (!) 383 lb 3.2 oz (173.8 kg)   LMP  (LMP Unknown) Comment: irregular periods previously, bleeding started in january  SpO2 99%   BMI 57.42 kg/m   BP/Weight 01/21/2018 12/17/2017 05/27/6061  Systolic BP 016 010 932  Diastolic BP 80 54 79  Wt. (Lbs) 383.2 378 369  BMI  57.42 56.64 57.79      Physical Exam  Constitutional: She is oriented to person, place, and time. She appears well-developed and well-nourished.  Cardiovascular: Normal rate, normal heart sounds and intact distal pulses.  No murmur heard. Pulmonary/Chest: Effort normal and breath sounds normal. She has no wheezes. She has no rales. She exhibits no tenderness.  Abdominal: Soft. Bowel sounds are normal. She exhibits no distension and no mass. There is no tenderness.  Musculoskeletal: Normal range of motion.  Tenderness to palpation of the left lumbar region Negative straight leg raise bilaterally  Neurological: She is alert and oriented to person, place, and time.  Skin: Skin is warm and dry.    CMP Latest Ref Rng & Units 12/16/2017 09/26/2017 09/17/2017  Glucose 70 - 99 mg/dL 127(H) 96 97  BUN 6 - 20 mg/dL 8 9 7   Creatinine 0.44 - 1.00 mg/dL 0.74 0.66 0.69  Sodium 135 - 145 mmol/L 143 138 145(H)  Potassium  3.5 - 5.1 mmol/L 4.0 3.8 4.0  Chloride 98 - 111 mmol/L 108 107 108(H)  CO2 22 - 32 mmol/L 28 25 23   Calcium 8.9 - 10.3 mg/dL 9.0 8.7(L) 8.9  Total Protein 6.0 - 8.5 g/dL - - 6.7  Total Bilirubin 0.0 - 1.2 mg/dL - - 0.6  Alkaline Phos 39 - 117 IU/L - - 137(H)  AST 0 - 40 IU/L - - 46(H)  ALT 0 - 32 IU/L - - 24    Lipid Panel     Component Value Date/Time   CHOL 126 09/17/2017 1042   TRIG 76 09/17/2017 1042   HDL 54 09/17/2017 1042   CHOLHDL 2.3 09/17/2017 1042   CHOLHDL 2.1 12/26/2015 0929   VLDL 15 12/26/2015 0929   LDLCALC 57 09/17/2017 1042    Lab Results  Component Value Date   HGBA1C 5.8 01/21/2018    Assessment & Plan:   1. Type 2 diabetes mellitus with other neurologic complication, without long-term current use of insulin (HCC) Controlled with A1c of 5.8 - POCT glucose (manual entry) - POCT glycosylated hemoglobin (Hb A1C)  2. Moderate persistent asthma without complication Increasing need of inhalers due to change in weather No exacerbations -  albuterol (PROVENTIL HFA;VENTOLIN HFA) 108 (90 Base) MCG/ACT inhaler; Inhale 1-2 puffs into the lungs every 6 (six) hours as needed for wheezing or shortness of breath.  Dispense: 54 g; Refill: 3 - Fluticasone-Salmeterol (ADVAIR DISKUS) 250-50 MCG/DOSE AEPB; Inhale 1 puff into the lungs 2 (two) times daily.  Dispense: 180 each; Refill: 1  3. Hypothyroidism, unspecified type Controlled  4. Acute left-sided low back pain without sciatica Toradol administered Placed on Flexeril  5. Essential hypertension Uncontrolled Comments lisinopril - lisinopril (PRINIVIL,ZESTRIL) 5 MG tablet; Take 1 tablet (5 mg total) by mouth daily.  Dispense: 30 tablet; Refill: 6  6. Seasonal allergies - fluticasone (FLONASE) 50 MCG/ACT nasal spray; Place 2 sprays into both nostrils daily.  Dispense: 16 g; Refill: 6   Meds ordered this encounter  Medications  . fluticasone (FLONASE) 50 MCG/ACT nasal spray    Sig: Place 2 sprays into both nostrils daily.    Dispense:  16 g    Refill:  6  . albuterol (PROVENTIL HFA;VENTOLIN HFA) 108 (90 Base) MCG/ACT inhaler    Sig: Inhale 1-2 puffs into the lungs every 6 (six) hours as needed for wheezing or shortness of breath.    Dispense:  54 g    Refill:  3  . cetirizine (ZYRTEC) 10 MG tablet    Sig: Take 1 tablet (10 mg total) by mouth daily.    Dispense:  30 tablet    Refill:  3  . Fluticasone-Salmeterol (ADVAIR DISKUS) 250-50 MCG/DOSE AEPB    Sig: Inhale 1 puff into the lungs 2 (two) times daily.    Dispense:  180 each    Refill:  1  . lisinopril (PRINIVIL,ZESTRIL) 5 MG tablet    Sig: Take 1 tablet (5 mg total) by mouth daily.    Dispense:  30 tablet    Refill:  6    Follow-up: Return in about 3 months (around 04/22/2018) for Follow-up of chronic medical conditions.   Charlott Rakes MD

## 2018-02-16 ENCOUNTER — Other Ambulatory Visit: Payer: Self-pay | Admitting: Family Medicine

## 2018-02-16 ENCOUNTER — Encounter: Payer: Self-pay | Admitting: Family Medicine

## 2018-02-16 DIAGNOSIS — E039 Hypothyroidism, unspecified: Secondary | ICD-10-CM

## 2018-02-16 MED ORDER — ACETAMINOPHEN-CODEINE #3 300-30 MG PO TABS: 1.0000 | ORAL_TABLET | Freq: Every day | ORAL | 0 refills | Status: DC

## 2018-02-16 MED ORDER — METHOCARBAMOL 500 MG PO TABS: 1000.0000 mg | ORAL_TABLET | Freq: Two times a day (BID) | ORAL | 1 refills | Status: DC

## 2018-02-17 IMAGING — DX DG FOOT COMPLETE 3+V*R*
3 series · 3 of 3 positions shown · non-contrast
Comparison: None.

CLINICAL DATA: Chronic bilateral foot pain, right greater than
left. No reported injury. Limited dorsal flexion of the first toe.

EXAM:
RIGHT FOOT COMPLETE - 3+ VIEW

[x foot ap right]
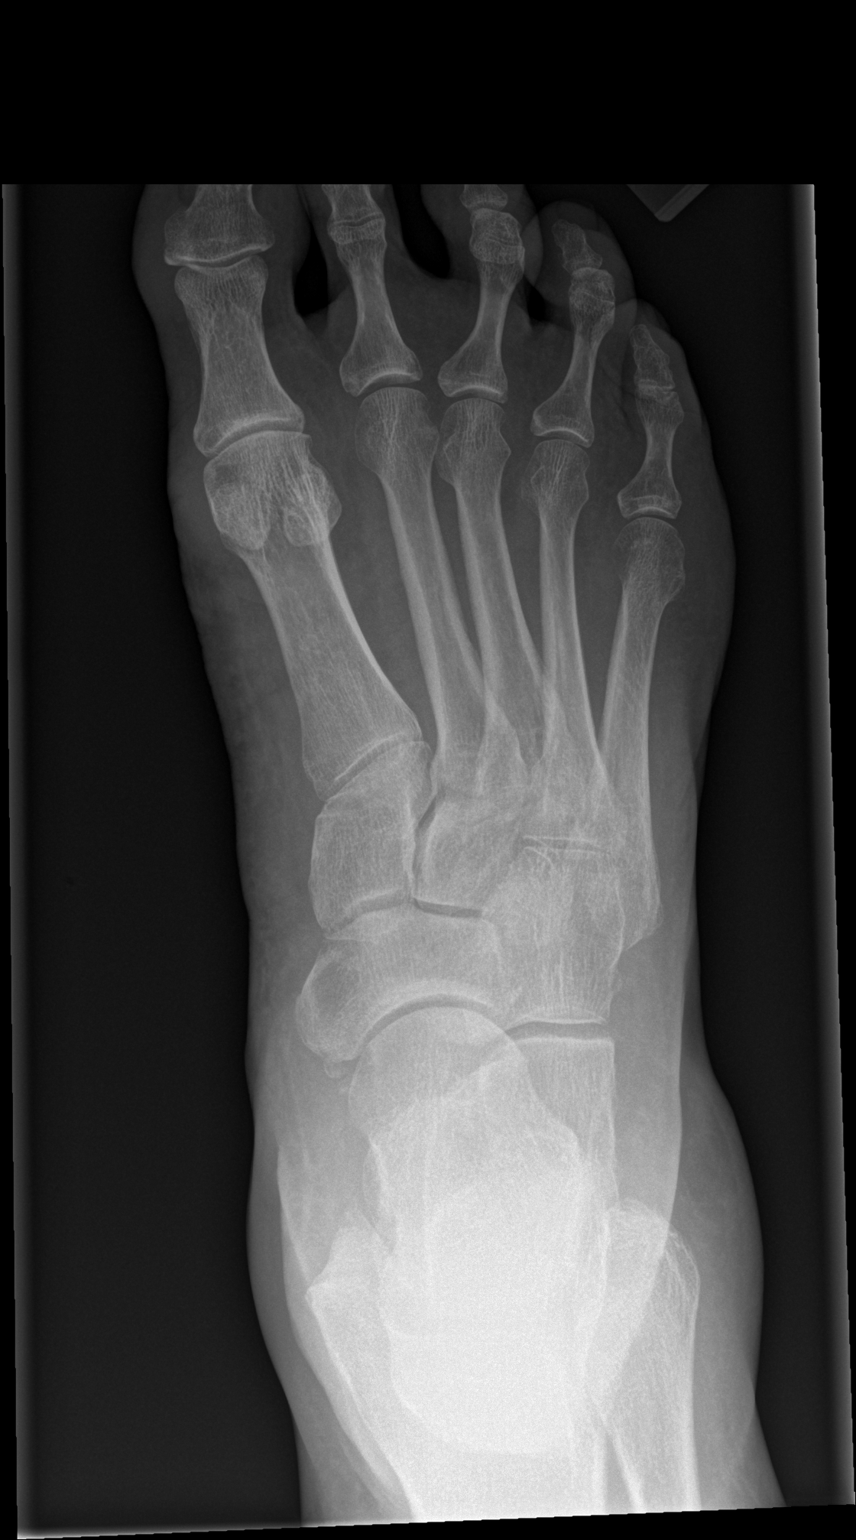

[x foot obl right]
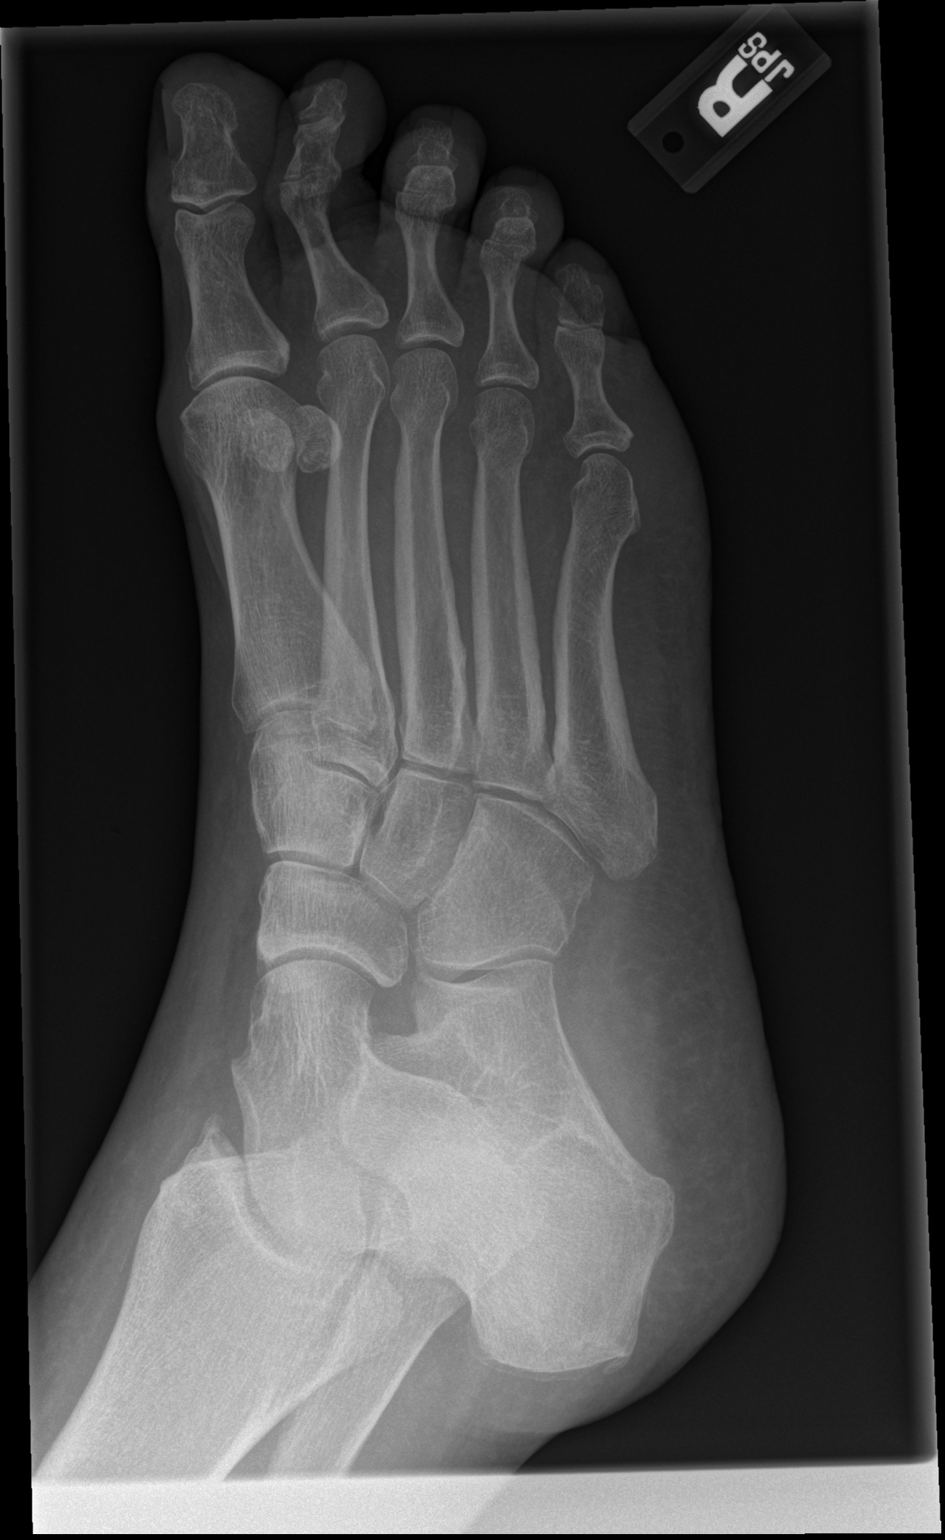

[x foot lat right]
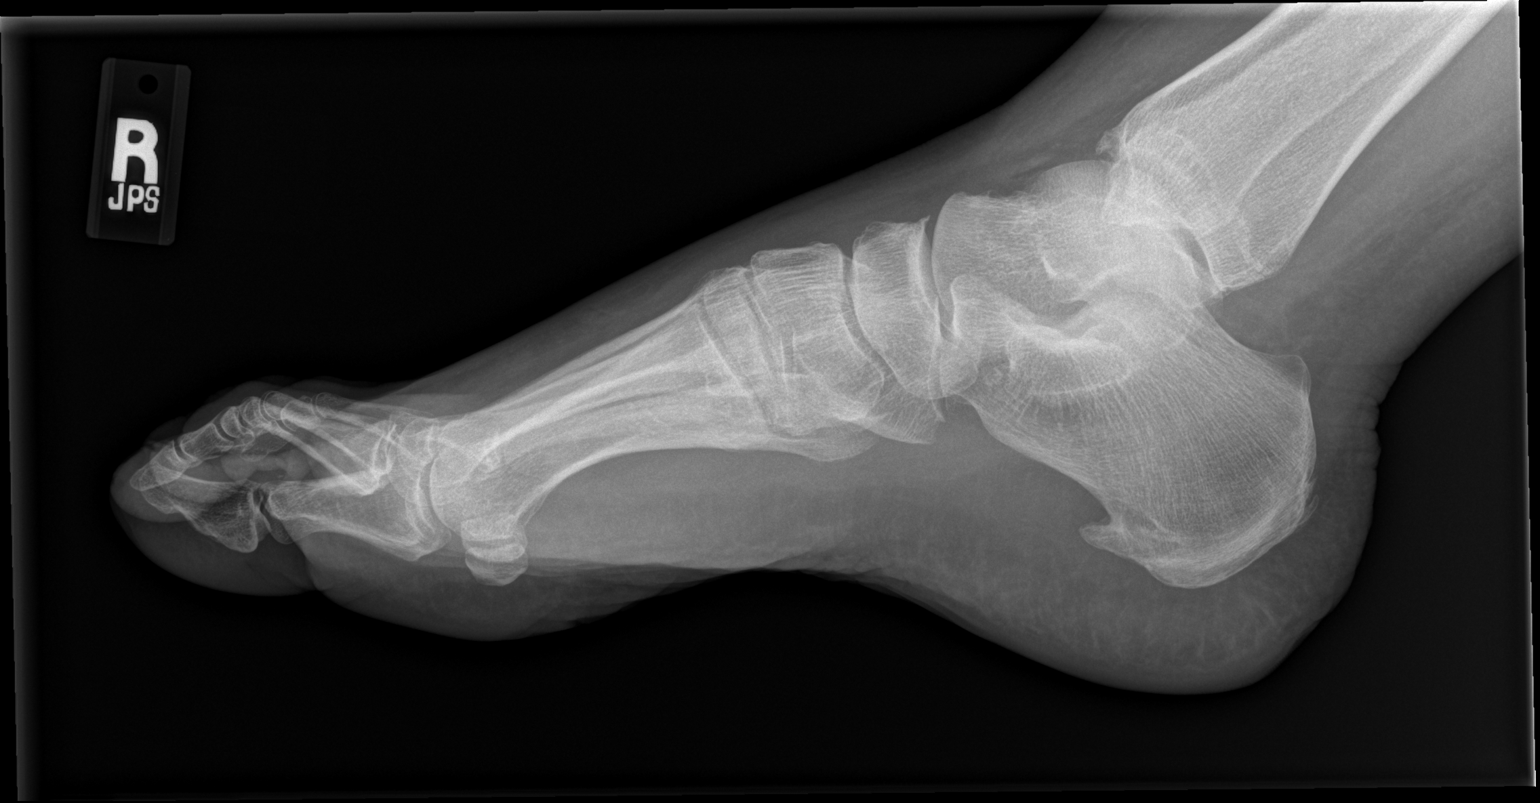

[3 of 3 positions shown; findings below may reference images not displayed]

FINDINGS: No fracture or dislocation. No suspicious focal osseous lesion.
Minimal first metatarsal-phalangeal joint osteoarthritis. Small
marginal osteophyte at the anterior distal right tibia. Small
plantar and tiny Achilles right calcaneal spurs. No radiopaque
foreign body.
IMPRESSION: 1. No fracture or malalignment in the right foot .
2. Minimal first MTP joint and anterior right ankle joint
osteoarthritis.
3. Small plantar and tiny Achilles right calcaneal spur.

## 2018-02-18 ENCOUNTER — Encounter: Payer: Self-pay | Admitting: Family Medicine

## 2018-02-19 ENCOUNTER — Other Ambulatory Visit: Payer: Self-pay | Admitting: Family Medicine

## 2018-02-19 ENCOUNTER — Encounter: Payer: Self-pay | Admitting: Family Medicine

## 2018-02-19 DIAGNOSIS — G8929 Other chronic pain: Secondary | ICD-10-CM

## 2018-02-19 DIAGNOSIS — M545 Low back pain, unspecified: Secondary | ICD-10-CM

## 2018-02-24 ENCOUNTER — Encounter: Payer: Self-pay | Admitting: Family Medicine

## 2018-03-16 ENCOUNTER — Encounter: Payer: Self-pay | Admitting: Physical Medicine & Rehabilitation

## 2018-03-26 ENCOUNTER — Telehealth: Payer: Self-pay | Admitting: *Deleted

## 2018-03-26 NOTE — Telephone Encounter (Signed)
Returned the patient's call and rescheduled her appt from tomorrow to 2/20

## 2018-03-27 ENCOUNTER — Inpatient Hospital Stay: Payer: Medicaid Other | Admitting: Gynecologic Oncology

## 2018-04-02 ENCOUNTER — Inpatient Hospital Stay: Payer: Medicaid Other | Admitting: Gynecologic Oncology

## 2018-04-02 ENCOUNTER — Telehealth: Payer: Self-pay | Admitting: *Deleted

## 2018-04-02 NOTE — Telephone Encounter (Signed)
Patient called and moved her appt from today to Monday due to the weather.

## 2018-04-06 ENCOUNTER — Inpatient Hospital Stay: Payer: Medicaid Other | Attending: Gynecologic Oncology | Admitting: Gynecologic Oncology

## 2018-04-06 ENCOUNTER — Encounter: Payer: Self-pay | Admitting: Gynecologic Oncology

## 2018-04-06 ENCOUNTER — Encounter: Payer: Medicaid Other | Attending: Physical Medicine & Rehabilitation

## 2018-04-06 ENCOUNTER — Other Ambulatory Visit: Payer: Self-pay

## 2018-04-06 ENCOUNTER — Ambulatory Visit
Admission: RE | Admit: 2018-04-06 | Discharge: 2018-04-06 | Disposition: A | Discharge status: Home / Self Care | Payer: Medicaid Other | Source: Ambulatory Visit | Attending: Physical Medicine & Rehabilitation | Admitting: Physical Medicine & Rehabilitation

## 2018-04-06 ENCOUNTER — Encounter: Payer: Self-pay | Admitting: Physical Medicine & Rehabilitation

## 2018-04-06 ENCOUNTER — Telehealth: Payer: Self-pay | Admitting: Family Medicine

## 2018-04-06 ENCOUNTER — Ambulatory Visit: Payer: Medicaid Other | Admitting: Physical Medicine & Rehabilitation

## 2018-04-06 VITALS — BP 126/52 | HR 76 | Temp 98.2°F | Resp 20 | Ht 67.0 in | Wt 382.7 lb

## 2018-04-06 VITALS — BP 138/82 | HR 78 | Ht 66.0 in | Wt 380.0 lb

## 2018-04-06 DIAGNOSIS — M545 Low back pain: Secondary | ICD-10-CM | POA: Insufficient documentation

## 2018-04-06 DIAGNOSIS — Z8559 Personal history of malignant neoplasm of other urinary tract organ: Secondary | ICD-10-CM | POA: Insufficient documentation

## 2018-04-06 DIAGNOSIS — J45909 Unspecified asthma, uncomplicated: Secondary | ICD-10-CM | POA: Insufficient documentation

## 2018-04-06 DIAGNOSIS — G8929 Other chronic pain: Secondary | ICD-10-CM | POA: Diagnosis present

## 2018-04-06 DIAGNOSIS — Z90710 Acquired absence of both cervix and uterus: Secondary | ICD-10-CM

## 2018-04-06 DIAGNOSIS — Z9049 Acquired absence of other specified parts of digestive tract: Secondary | ICD-10-CM | POA: Diagnosis not present

## 2018-04-06 DIAGNOSIS — M5440 Lumbago with sciatica, unspecified side: Secondary | ICD-10-CM | POA: Diagnosis not present

## 2018-04-06 DIAGNOSIS — R7303 Prediabetes: Secondary | ICD-10-CM | POA: Insufficient documentation

## 2018-04-06 DIAGNOSIS — Z6841 Body Mass Index (BMI) 40.0 and over, adult: Secondary | ICD-10-CM | POA: Diagnosis not present

## 2018-04-06 DIAGNOSIS — Z90722 Acquired absence of ovaries, bilateral: Secondary | ICD-10-CM | POA: Diagnosis not present

## 2018-04-06 DIAGNOSIS — Z08 Encounter for follow-up examination after completed treatment for malignant neoplasm: Secondary | ICD-10-CM

## 2018-04-06 DIAGNOSIS — Z8542 Personal history of malignant neoplasm of other parts of uterus: Secondary | ICD-10-CM | POA: Insufficient documentation

## 2018-04-06 DIAGNOSIS — E039 Hypothyroidism, unspecified: Secondary | ICD-10-CM | POA: Diagnosis not present

## 2018-04-06 DIAGNOSIS — M21371 Foot drop, right foot: Secondary | ICD-10-CM | POA: Diagnosis not present

## 2018-04-06 DIAGNOSIS — C541 Malignant neoplasm of endometrium: Secondary | ICD-10-CM

## 2018-04-06 DIAGNOSIS — E669 Obesity, unspecified: Secondary | ICD-10-CM | POA: Insufficient documentation

## 2018-04-06 DIAGNOSIS — M5442 Lumbago with sciatica, left side: Principal | ICD-10-CM

## 2018-04-06 NOTE — Progress Notes (Signed)
Subjective:    Patient ID: Janet [name], female    DOB: 06-19-1970, 48 y.o.   MRN: [id number]  HPI CC  Low back pain with pain down the LLE Pain radiates to supramalleolar area Onset ~11/21/2017, no fall or trauma Has had intermittent low back pain but never this bad Weight has been stable at the current level.  She has look into bariatric surgery and has gone through several preliminary steps but needs another appointment with a dietitian as well as a cardiologist. Alleviating factor, heating (temporary) , flexeril doesn't help, "oxy something" causes drowsiness Patient's pain increases with prolonged standing.  Pain Inventory Average Pain 7 Pain Right Now 7 My pain is constant, burning, tingling and aching  In the last 24 hours, has pain interfered with the following? General activity 5 Relation with others 5 Enjoyment of life 7 What TIME of day is your pain at its worst? morning Sleep (in general) Poor  Pain is worse with: bending, sitting, inactivity and some activites Pain improves with: medication Relief from Meds: 2  Mobility walk with assistance use a cane how many minutes can you walk? 5 ability to climb steps?  no do you drive?  yes Do you have any goals in this area?  yes  Function not employed: date last employed May 2018 I need assistance with the following:  household duties and shopping  Neuro/Psych weakness numbness trouble walking spasms anxiety  Prior Studies CT/MRI  Physicians involved in your care Any changes since last visit?  no Primary care Dr. Charlott Rakes Neurologist Dr. Krista Blue Orthopedist [facility]   Family History  Problem Relation Age of Onset  . Heart attack Mother   . Suicidality Father    Social History   Socioeconomic History  . Marital status: Widowed    Spouse name: Not on file  . Number of children: 1  . Years of education: college  . Highest education level: Not on file  Occupational History  .  Occupation: Unemployed  Social Needs  . Financial resource strain: Not on file  . Food insecurity:    Worry: Not on file    Inability: Not on file  . Transportation needs:    Medical: Not on file    Non-medical: Not on file  Tobacco Use  . Smoking status: Never Smoker  . Smokeless tobacco: Never Used  Substance and Sexual Activity  . Alcohol use: No  . Drug use: No  . Sexual activity: Not Currently    Birth control/protection: None  Lifestyle  . Physical activity:    Days per week: Not on file    Minutes per session: Not on file  . Stress: Not on file  Relationships  . Social connections:    Talks on phone: Not on file    Gets together: Not on file    Attends religious service: Not on file    Active member of club or organization: Not on file    Attends meetings of clubs or organizations: Not on file    Relationship status: Not on file  Other Topics Concern  . Not on file  Social History Narrative   Lives at home with her son.   Right-handed.   2 cups caffeine per day.   Past Surgical History:  Procedure Laterality Date  . ABDOMINAL HYSTERECTOMY    . CARPAL TUNNEL RELEASE    . CESAREAN SECTION    . CHOLECYSTECTOMY    . cyst removal     uterus  .  DILATION AND CURETTAGE OF UTERUS     still birth  . DILATION AND CURETTAGE OF UTERUS N/A 06/11/2016   Procedure: DILATATION AND CURETTAGE;  Surgeon: Emily Filbert, MD;  Location: [facility];  Service: Gynecology;  Laterality: N/A;  . ROBOTIC ASSISTED TOTAL HYSTERECTOMY WITH BILATERAL SALPINGO OOPHERECTOMY Bilateral 07/09/2016   Procedure: XI ROBOTIC ASSISTED TOTAL HYSTERECTOMY WITH BILATERAL SALPINGO OOPHORECTOMY FOR UTERUS GREATER THAN 250 GRAMS, LYSIS OF ADHESIONS;  Surgeon: Everitt Amber, MD;  Location: [facility];  Service: Gynecology;  Laterality: Bilateral;  . SINUS EXPLORATION    . WISDOM TOOTH EXTRACTION     Past Medical History:  Diagnosis Date  . Anemia   . Anxiety   . Asthma   . Depression   . Hypothyroidism   .  Pre-diabetes   . Right foot drop   . Sleep apnea   . Thyroid disease   . Uterine cancer (HCC)    BP 138/82   Pulse 78   Ht 5\' 6"  (1.676 m)   Wt (!) 380 lb (172.4 kg)   LMP  (LMP Unknown) Comment: irregular periods previously, bleeding started in january  SpO2 94%   BMI 61.33 kg/m   Opioid Risk Score:   Fall Risk Score:  `1  Depression screen PHQ 2/9  Depression screen [facility] 2/9 04/06/2018 09/17/2017 07/23/2017 05/26/2017 02/18/2017 11/18/2016 10/07/2016  Decreased Interest 1 1 0 0 1 1 0  Down, Depressed, Hopeless 0 1 1 1 1 1 1   PHQ - 2 Score 1 2 1 1 2 2 1   Altered sleeping 0 0 1 1 0 0 0  Tired, decreased energy 1 1 1 1 1 1  0  Change in appetite 0 0 0 0 1 0 0  Feeling bad or failure about yourself  1 0 0 1 1 1 1   Trouble concentrating 0 0 0 0 0 0 0  Moving slowly or fidgety/restless 0 0 0 0 0 0 0  Suicidal thoughts 0 0 0 0 0 0 0  PHQ-9 Score 3 3 3 4 5 4 2   Difficult doing work/chores Somewhat difficult - - - - - -  Some recent data might be hidden    Review of Systems  Constitutional: Positive for diaphoresis.  HENT: Negative.   Eyes: Negative.   Respiratory: Negative.   Cardiovascular: Negative.   Gastrointestinal: Positive for constipation.  Endocrine: Negative.   Genitourinary: Negative.   Musculoskeletal: Negative.   Skin: Negative.   Allergic/Immunologic: Negative.   Neurological: Negative.   Hematological: Negative.   Psychiatric/Behavioral: Negative.   All other systems reviewed and are negative.      Objective:   Physical Exam Vitals signs and nursing note reviewed.  Constitutional:      Appearance: Normal appearance. She is obese.  HENT:     Head: Normocephalic and atraumatic.     Nose: Nose normal.     Mouth/Throat:     Mouth: Mucous membranes are moist.  Eyes:     Extraocular Movements: Extraocular movements intact.     Pupils: Pupils are equal, round, and reactive to light.  Neck:     Musculoskeletal: Normal range of motion.  Cardiovascular:      Rate and Rhythm: Normal rate and regular rhythm.     Pulses: Normal pulses.     Heart sounds: Normal heart sounds. No murmur.  Pulmonary:     Effort: Pulmonary effort is normal.     Breath sounds: Normal breath sounds. No stridor. No wheezing.  Abdominal:  General: Bowel sounds are normal.     Palpations: Abdomen is soft.  Musculoskeletal:     Comments: Mild tenderness palpation in the lumbar paraspinal area. Her lumbar range of motion is reduced with flexion extension lateral bending and rotation.  Lumbar extension causes the most amount of discomfort however  Skin:    General: Skin is warm and dry.  Neurological:     Mental Status: She is alert.     Cranial Nerves: No cranial nerve deficit, dysarthria or facial asymmetry.     Sensory: Sensory deficit present.     Motor: No weakness or tremor.     Coordination: Coordination is intact.     Gait: Gait normal.     Comments: Motor strength is 5/5 bilateral deltoid bicep tricep grip as well as hip flexion extensor ankle dorsiflexion plantarflexion Sensation is reduced to pinprick on the dorsum of the right foot as well as the right great toe.  Intact and other dermatomes intact on the left side in both upper extremities.  Psychiatric:        Mood and Affect: Mood normal.        Behavior: Behavior normal.    Tenderness to palpation left side of the lumbar paraspinals more so than on the right side       Assessment & Plan:  1.  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 Her back pain is actually worse on the left side.  She does have evidence of lumbar spondylosis on MRI and pain with extension would suggest facet arthropathy as a pain generator. Patient has tried medication management as well  as physical therapy.  We did discuss the impact of her body habitus on her posture and the shift of weightbearing from the disc to the facet joints. We will set her up for L3-L4 medial branch and L5 dorsal ramus blocks Discussed with patient agrees with plan.

## 2018-04-06 NOTE — Telephone Encounter (Signed)
Pt came in this morning and dropped off paperwork

## 2018-04-06 NOTE — Progress Notes (Signed)
Follow-up Note: Gyn-Onc  Consult was initially requested by Janet [name]. Janet [name] [name] for the evaluation of Janet [name] [name] 48 y.o. female  CC:  Chief Complaint  Patient presents with  . Endometrial cancer [facility])    Assessment/Plan:  Janet [name] [name] is a 48 y.o.  with stage IA grade 2 endometrial cancer s/p robotic hysterectomy, BSO on 6/78/93 complicated by post op wound separation.  Endometrial cancer: Pathology revealed low risk factors for recurrence, therefore no adjuvant therapy is recommended according to NCCN guidelines.  I discussed risk for recurrence and typical symptoms encouraged her to notify us of these should they develop between visits.  I recommend she have follow-up every 6 months until May, 2023 in accordance with NCCN guidelines. Those visits should include symptom assessment, physical exam and pelvic examination. Pap smears are not indicated or recommended in the routine surveillance of endometrial cancer.  Enlarged aortocaval nodes - repeat CT imaging in November, 2020 to re-evaluate for progression. Stable to date.  Will need creatinine assessed closer to date.   Anal pain much better with treatment of fissure by Janet [name] [name], we appreciate her help with this.     HPI: [name] [name] is a 48 y.o.  G2P1 with history of irregular menses.  Janet [name] [name] presented To Janet [name]. Janet [name] [name] in  with a report of bleeding daily for almost a month. Janet [name] [name] was unable to tolerate an office examination and thus she underwent hysteroscopy D&C on 06/11/2016.  Pathology was notable for complex atypical hyperplasia with a few small foci suspicious for early/microscopic adenocarcinoma. She presented to the emergency room on May 6 and was diagnosed with endometritis. She's currently receiving course of Flagyl which will be completed on 06/23/2016.  There is no family history of gynecologic GI or breast malignancies.    On 07/09/16 she underwent robotic assisted total hysterectomy, BSO  for a uterus >250 (336)gm with minilaparotomy for specimen delivery.  Final pathology revealed a 2.4cm grade 2 endometrioid tumor with no myometrial invasion or LVSI. Lymphadenectomy was not performed due to extreme abdominal adiposity (BMI 56) and preoperative preinvasive diagnosis. She was determined to have low risk features in her pathology and therefore no adjuvant therapy was recommended in accordance with NCCN guidelines.  Postop week 1 her minilaparotomy wound opened (no cellulitis) and closed via secondary intention.  On post-op imaging (CT) on 11/21/16 (due to inability to stage because of obesity) she had findings of Interval increase in size of a now mildly enlarged retroperitoneal lymph node in the aortocaval space. Close attention in this region on follow-up recommended as metastatic disease is a concern. 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  PET/CT was ordered and performed on 12/04/16 which showed aortocaval adenopathy measuring 1.5cm. Non-PET avid. The images were reviewed at tumor board and felt to not be likely metastatic disease therefore observation and repeat imaging was recommended.  Repeat CT scan on 04/10/17 showed stabel PA nodes (1.6cm largest).  She developed new onset left lower extremity edema in May, 2019. Dopplers negative.  Surveillance CT abd/pelvis in November, 2019 showed nonspecific mild retroperitoneal adenopathy which is stable, the continued stability of these nodes along with the absence of hypermetabolism on PET in 2018 suggest reactive adenopathy, although continued CT surveillance is warranted.  No new potential findings of metastatic disease in the abdomen or pelvis.  Hepatic cirrhosis was seen.  Stable moderate splenomegaly and lower paraesophageal periumbilical and perisplenic varices was seen.  Interval Hx:  She reports improved pain from her anal fissure  after seeing Janet [name] [name] who treated her with BID miralax. She has some back pain and sciatica. No bleeding or symptoms concerning for recurrent disease.   Review of Systems:  Constitutional  Feels well,   Cardiovascular  No chest pain, shortness of breath, or edema  Pulmonary  No cough or wheeze.  Gastro Intestinal  No nausea, vomitting, or diarrhoea. No bright red blood per rectum, no abdominal pain, no change in bowel movement,  Genito Urinary  No frequency, urgency, dysuria, vaginal spotting Musculo Skeletal  No myalgia, arthralgia, joint swelling or pain  Neurologic  No weakness, numbness, change in gait,  Psychology  No depression, anxiety from diagnoses and social situation  Current Meds:  Outpatient Encounter Medications as of 04/06/2018  Medication Sig  . acetaminophen-codeine (TYLENOL #3) 300-30 MG tablet Take 1 tablet by mouth at bedtime.  Marland Kitchen albuterol (PROVENTIL HFA;VENTOLIN HFA) 108 (90 Base) MCG/ACT inhaler Inhale 1-2 puffs into the lungs every 6 (six) hours as needed for wheezing or shortness of breath.  Marland Kitchen albuterol (PROVENTIL) (2.5 MG/3ML) 0.083% nebulizer solution Take 3 mLs (2.5 mg total) by nebulization every 6 (six) hours as needed for wheezing or shortness of breath.  . cetirizine (ZYRTEC) 10 MG tablet Take 1 tablet (10 mg total) by mouth daily.  . cholecalciferol (VITAMIN D) 1000 units tablet Take 1,000 Units by mouth at bedtime.  . cyclobenzaprine (FLEXERIL) 10 MG tablet Take 1 tablet (10 mg total) by mouth 2 (two) times daily as needed for muscle spasms.  . DULoxetine (CYMBALTA) 60 MG capsule Take 1 capsule (60 mg total) by mouth daily.  . fluticasone (FLONASE) 50 MCG/ACT nasal spray Place 2 sprays into both nostrils daily.  . Fluticasone-Salmeterol (ADVAIR DISKUS) 250-50 MCG/DOSE AEPB Inhale 1 puff into the lungs 2 (two) times daily.  Marland Kitchen levothyroxine (SYNTHROID, LEVOTHROID) 175 MCG tablet TAKE 1 TABLET BY MOUTH ONCE DAILY BEFORE BREAKFAST  . lisinopril  (PRINIVIL,ZESTRIL) 5 MG tablet Take 1 tablet (5 mg total) by mouth daily.  . methocarbamol (ROBAXIN) 500 MG tablet Take 2 tablets (1,000 mg total) by mouth 2 (two) times daily.  . montelukast (SINGULAIR) 10 MG tablet Take 1 tablet (10 mg total) by mouth at bedtime.  . OXcarbazepine (TRILEPTAL) 150 MG tablet Take 1 tablet (150 mg total) by mouth 2 (two) times daily.  . Pediatric Multivitamins-Iron (CHILDRENS MULTI-VITS/IRON PO) Take 1 tablet by mouth at bedtime.  . pregabalin (LYRICA) 75 MG capsule Take 1 capsule (75 mg total) by mouth 2 (two) times daily.  Marland Kitchen triamcinolone cream (KENALOG) 0.1 % Apply 1 application topically 2 (two) times daily. Prn itching  . [DISCONTINUED] ibuprofen (ADVIL,MOTRIN) 600 MG tablet Take 1 tablet (600 mg total) by mouth every 6 (six) hours as needed. (Patient not taking: Reported on 01/21/2018)  . [DISCONTINUED] Lidocaine 4 % GEL Apply to right toe as needed. (Patient not taking: Reported on 04/06/2018)  . [DISCONTINUED] meloxicam (MOBIC) 15 MG tablet Take 1 tablet (15 mg total) by mouth daily. (Patient not taking: Reported on 04/06/2018)   No facility-administered encounter medications on file as of 04/06/2018.     Allergy:  Allergies  Allergen Reactions  . Ciprofloxacin Shortness Of Breath  . Shrimp [Shellfish Allergy] Other (See Comments)    positive allergy test    Social Hx:   Social History   Socioeconomic History  . Marital status: Widowed    Spouse name: Not on file  . Number of children: 1  .  Years of education: college  . Highest education level: Not on file  Occupational History  . Occupation: Unemployed  Social Needs  . Financial resource strain: Not on file  . Food insecurity:    Worry: Not on file    Inability: Not on file  . Transportation needs:    Medical: Not on file    Non-medical: Not on file  Tobacco Use  . Smoking status: Never Smoker  . Smokeless tobacco: Never Used  Substance and Sexual Activity  . Alcohol use: No  . Drug  use: No  . Sexual activity: Not Currently    Birth control/protection: None  Lifestyle  . Physical activity:    Days per week: Not on file    Minutes per session: Not on file  . Stress: Not on file  Relationships  . Social connections:    Talks on phone: Not on file    Gets together: Not on file    Attends religious service: Not on file    Active member of club or organization: Not on file    Attends meetings of clubs or organizations: Not on file    Relationship status: Not on file  . Intimate partner violence:    Fear of current or ex partner: Not on file    Emotionally abused: Not on file    Physically abused: Not on file    Forced sexual activity: Not on file  Other Topics Concern  . Not on file  Social History Narrative   Lives at home with her son.   Right-handed.   2 cups caffeine per day.   Husband was diagnosed with leukemia in January 2018 with a life expectancy of approximately 6 months. At this time he is receiving supportive care and regularly is managed with blood and platelet transfusions.  Regular home nursing visits are in place.   Janet [name] [name] denies history of sexual or physical abuse.  Past Surgical Hx:  Past Surgical History:  Procedure Laterality Date  . ABDOMINAL HYSTERECTOMY    . CARPAL TUNNEL RELEASE    . CESAREAN SECTION    . CHOLECYSTECTOMY    . cyst removal     uterus  . DILATION AND CURETTAGE OF UTERUS     still birth  . DILATION AND CURETTAGE OF UTERUS N/A 06/11/2016   Procedure: DILATATION AND CURETTAGE;  Surgeon: Emily Filbert, MD;  Location: [facility];  Service: Gynecology;  Laterality: N/A;  . ROBOTIC ASSISTED TOTAL HYSTERECTOMY WITH BILATERAL SALPINGO OOPHERECTOMY Bilateral 07/09/2016   Procedure: XI ROBOTIC ASSISTED TOTAL HYSTERECTOMY WITH BILATERAL SALPINGO OOPHORECTOMY FOR UTERUS GREATER THAN 250 GRAMS, LYSIS OF ADHESIONS;  Surgeon: Everitt Amber, MD;  Location: [facility];  Service: Gynecology;  Laterality: Bilateral;  . SINUS EXPLORATION     . WISDOM TOOTH EXTRACTION      Past Medical Hx:  Past Medical History:  Diagnosis Date  . Anemia   . Anxiety   . Asthma   . Depression   . Hypothyroidism   . Pre-diabetes   . Right foot drop   . Sleep apnea   . Thyroid disease   . Uterine cancer [facility])     Past Gynecological History:  Gravida 2 para 1 menarche at 77 with irregular menses since last Pap 06/11/2016 within normal limits no history of abnormal Pap test reports history of OCP use in her 49s  Family Hx:  Family History  Problem Relation Age of Onset  . Heart attack Mother   . Suicidality Father  Vitals:  Blood pressure (!) 126/52, pulse 76, temperature 98.2 F (36.8 C), temperature source Oral, resp. rate 20, height 5\' 7"  (1.702 m), weight (!) 382 lb 11.2 oz (173.6 kg), SpO2 98 %. Body mass index is 59.94 kg/m. Wt Readings from Last 3 Encounters:  04/06/18 (!) 382 lb 11.2 oz (173.6 kg)  01/21/18 (!) 383 lb 3.2 oz (173.8 kg)  12/17/17 (!) 378 lb (171.5 kg)    Physical Exam: WD in NAD Neck  Supple NROM, without any enlargements.  Lymph Node Survey No cervical supraclavicular or inguinal adenopathy Cardiovascular  Pulse normal rate, regularity and rhythm. S1 and S2 normal.  Lungs  Clear to auscultation bilaterally, without wheezes/crackles/rhonchi. Good air movement.  Skin  No rash/lesions/breakdown  Psychiatry  Alert and oriented appropriate mood affect speech and reasoning. Abdomen  Normoactive bowel sounds, abdomen soft, non-tender. large pannus, incision closed and no evidence of hernia Back No CVA tenderness Genito Urinary  : normal vaginal cuff - no palpable masses or cuff lesions. Rectal  deferred Extremities  + left lower extremity 2+, no tenderness.  Janet [name] Solo, MD, PhD 04/06/2018, 9:40 AM

## 2018-04-06 NOTE — Patient Instructions (Signed)
Please notify Dr Denman George at phone number [phone] if you notice vaginal bleeding, new pelvic or abdominal pains, bloating, feeling full easy, or a change in bladder or bowel function.   Please contact Dr Serita Grit office (at [phone]) in May, 2020 to request an appointment with her for August, 2020.

## 2018-04-24 ENCOUNTER — Telehealth: Payer: Self-pay | Admitting: *Deleted

## 2018-04-24 MED ORDER — DIAZEPAM 10 MG PO TABS: ORAL_TABLET | ORAL | 0 refills | Status: DC

## 2018-04-24 NOTE — Telephone Encounter (Signed)
Ms Janet [name] called for a pre med before her injectionnext week on 04/28/18  Diazepam called to the pharmacy per protocol. Ms Janet [name] notified.

## 2018-04-27 ENCOUNTER — Ambulatory Visit: Payer: Medicaid Other | Admitting: Family Medicine

## 2018-04-28 ENCOUNTER — Encounter: Payer: Medicaid Other | Attending: Physical Medicine & Rehabilitation

## 2018-04-28 ENCOUNTER — Encounter: Payer: Self-pay | Admitting: Physical Medicine & Rehabilitation

## 2018-04-28 ENCOUNTER — Other Ambulatory Visit: Payer: Self-pay

## 2018-04-28 ENCOUNTER — Ambulatory Visit: Payer: Medicaid Other | Admitting: Physical Medicine & Rehabilitation

## 2018-04-28 VITALS — BP 138/79 | HR 73 | Ht 67.0 in | Wt 383.0 lb

## 2018-04-28 DIAGNOSIS — Z8559 Personal history of malignant neoplasm of other urinary tract organ: Secondary | ICD-10-CM | POA: Diagnosis not present

## 2018-04-28 DIAGNOSIS — M47816 Spondylosis without myelopathy or radiculopathy, lumbar region: Secondary | ICD-10-CM

## 2018-04-28 DIAGNOSIS — J45909 Unspecified asthma, uncomplicated: Secondary | ICD-10-CM | POA: Insufficient documentation

## 2018-04-28 DIAGNOSIS — G8929 Other chronic pain: Secondary | ICD-10-CM | POA: Insufficient documentation

## 2018-04-28 DIAGNOSIS — R7303 Prediabetes: Secondary | ICD-10-CM | POA: Insufficient documentation

## 2018-04-28 DIAGNOSIS — Z9049 Acquired absence of other specified parts of digestive tract: Secondary | ICD-10-CM | POA: Diagnosis not present

## 2018-04-28 DIAGNOSIS — M545 Low back pain: Secondary | ICD-10-CM | POA: Diagnosis not present

## 2018-04-28 NOTE — Progress Notes (Signed)
  PROCEDURE RECORD [facility]   Name: Janet [name] DOB:17-Apr-1970 MRN: [id number]  Date:04/28/2018  Physician: Alysia Penna, MD    Nurse/CMA: Bright CMA  Allergies:  Allergies  Allergen Reactions  . Ciprofloxacin Shortness Of Breath  . Shrimp [Shellfish Allergy] Other (See Comments)    positive allergy test    Consent Signed: Yes.    Is patient diabetic? No.  CBG today? NA  Pregnant: No. LMP: No LMP recorded (lmp unknown). Patient has had a hysterectomy. (age [age])  Anticoagulants: no Anti-inflammatory: no Antibiotics: no  Procedure: Left L-5 Medial Branch Blocks Position: Prone  Start Time: 12:58pm End Time: 1:08pm Fluoro Time: 55s  RN/CMA Bright CMA Bright CMA    Time 12:39pm 112pm    BP 138/79 137/80    Pulse 73 80    Respirations 16 16    O2 Sat 96 96    S/S 6 6    Pain Level 6/10 6/10     D/C home with Brother, [name] & O X 3, D/C instructions reviewed, and sits independently.

## 2018-04-28 NOTE — Patient Instructions (Signed)

## 2018-04-28 NOTE — Progress Notes (Signed)
Left Lumbar L3, L4  medial branch blocks and L 5 dorsal ramus injection under fluoroscopic guidance   Indication: Left Lumbar pain which is not relieved by medication management or other conservative care and interfering with self-care and mobility.  Informed consent was obtained after describing risks and benefits of the procedure with the patient, this includes bleeding, bruising, infection, paralysis and medication side effects.  The patient wishes to proceed and has given written consent.  The patient was placed in a prone position.  The lumbar area was marked and prepped with Betadine.  One mL of 1% lidocaine was injected into each of 3 areas into the skin and subcutaneous tissue.  Then a 22-gauge 7in spinal needle was inserted targeting the junction of the left S1 superior articular process and sacral ala junction.  Needle was advanced under fluoroscopic guidance.  Bone contact was made. Isovue 200 was injected x 0.5 mL demonstrating no intravascular uptake.  Then a solution containing  2% MPF lidocaine was injected x 0.5 mL.  Then the left L5 superior articular process in transverse process junction was targeted.  Bone contact was made. Isovue 200 was injected x 0.5 mL demonstrating no intravascular uptake.  Then a solution containing 2% MPF lidocaine was injected x 0.5 mL.  Then the left L4 superior articular process in transverse process junction was targeted.  Bone contact was made.  Isovue 200 was injected x 0.5 mL demonstrating no intravascular uptake.  Then a solution containing  2% MPF lidocaine was injected x 0.5 mL.  Patient tolerated procedure well.  Post procedure instructions were given.

## 2018-05-05 ENCOUNTER — Encounter: Payer: Self-pay | Admitting: Gynecologic Oncology

## 2018-05-05 ENCOUNTER — Telehealth: Payer: Self-pay

## 2018-05-05 ENCOUNTER — Ambulatory Visit: Payer: Medicaid Other | Admitting: Family Medicine

## 2018-05-05 NOTE — Telephone Encounter (Signed)
Ms Zimny states that last Tuesday she lacerated an area to the left of the clitoris with a fingernail. The cut is about an inch and a half long. She has been applying Vaseline to affected area  as urine is burning.  The wound  remains red and sore. Afebrile. No shaking chills. No purulent drainage noted.  Pt will attempt to take a picture of the wound and send it via my chart to Joylene John, NP. to review. There should be a picture in my chart by Wednesday morning.  Suggested that Ms Fryman obtain neosporin/tripple ATB ointment to apply to affected area this evening as well as use a spritzer to dilute urine. Pt verbalized understanding.

## 2018-05-06 NOTE — Telephone Encounter (Signed)
Told Janet [name] that Joylene John, NP  Reviewed the picture that she sent via [facility]. The laceration does not look infected.  She needs to continue interventions as noted below. She is to call is she is running a a temp of 100.5 or higher or notices any purulent drainage. Janet Haack verbalized understanding.

## 2018-05-12 ENCOUNTER — Other Ambulatory Visit: Payer: Self-pay | Admitting: Neurology

## 2018-05-12 ENCOUNTER — Other Ambulatory Visit: Payer: Self-pay | Admitting: *Deleted

## 2018-05-12 ENCOUNTER — Other Ambulatory Visit: Payer: Self-pay | Admitting: Family Medicine

## 2018-05-12 DIAGNOSIS — E1149 Type 2 diabetes mellitus with other diabetic neurological complication: Secondary | ICD-10-CM

## 2018-05-12 DIAGNOSIS — E039 Hypothyroidism, unspecified: Secondary | ICD-10-CM

## 2018-05-12 MED ORDER — PREGABALIN 75 MG PO CAPS: 75.0000 mg | ORAL_CAPSULE | Freq: Two times a day (BID) | ORAL | 5 refills | Status: DC

## 2018-05-12 MED ORDER — OXCARBAZEPINE 150 MG PO TABS: 150.0000 mg | ORAL_TABLET | Freq: Two times a day (BID) | ORAL | 1 refills | Status: DC

## 2018-05-26 ENCOUNTER — Telehealth: Payer: Self-pay | Admitting: *Deleted

## 2018-05-26 NOTE — Telephone Encounter (Signed)
We can do left sacroiliac injection instead [id number]

## 2018-05-26 NOTE — Telephone Encounter (Signed)
Patient left a message concerning her upcoming appointment for injection.  She states the last one did not last more than 15 minutes. She is asking if Dr. Letta Pate will try something different for her next injection.

## 2018-05-28 MED ORDER — DIAZEPAM 10 MG PO TABS: ORAL_TABLET | ORAL | 0 refills | Status: DC

## 2018-05-28 NOTE — Telephone Encounter (Signed)
Patient called requesting Valium for procedure. Called into pharmacy.

## 2018-05-29 ENCOUNTER — Ambulatory Visit: Payer: Medicaid Other | Admitting: Physical Medicine & Rehabilitation

## 2018-05-30 ENCOUNTER — Encounter: Payer: Self-pay | Admitting: Family Medicine

## 2018-05-30 ENCOUNTER — Encounter: Payer: Self-pay | Admitting: Gynecologic Oncology

## 2018-06-02 ENCOUNTER — Other Ambulatory Visit: Payer: Self-pay | Admitting: Gynecologic Oncology

## 2018-06-02 DIAGNOSIS — B373 Candidiasis of vulva and vagina: Secondary | ICD-10-CM

## 2018-06-02 DIAGNOSIS — B3731 Acute candidiasis of vulva and vagina: Secondary | ICD-10-CM

## 2018-06-02 MED ORDER — FLUCONAZOLE 100 MG PO TABS: 100.0000 mg | ORAL_TABLET | Freq: Every day | ORAL | 0 refills | Status: DC

## 2018-06-02 NOTE — Progress Notes (Signed)
See mychart messages. Patient sent message about vulvovaginal candidiasis.

## 2018-06-12 ENCOUNTER — Encounter: Payer: Medicaid Other | Admitting: Physical Medicine & Rehabilitation

## 2018-06-15 ENCOUNTER — Encounter: Payer: Self-pay | Admitting: Family Medicine

## 2018-06-15 ENCOUNTER — Other Ambulatory Visit: Payer: Self-pay

## 2018-06-15 ENCOUNTER — Ambulatory Visit: Payer: Medicaid Other | Attending: Family Medicine | Admitting: Family Medicine

## 2018-06-15 DIAGNOSIS — J4540 Moderate persistent asthma, uncomplicated: Secondary | ICD-10-CM | POA: Diagnosis not present

## 2018-06-15 DIAGNOSIS — L0292 Furuncle, unspecified: Secondary | ICD-10-CM

## 2018-06-15 DIAGNOSIS — E039 Hypothyroidism, unspecified: Secondary | ICD-10-CM | POA: Diagnosis not present

## 2018-06-15 DIAGNOSIS — E1149 Type 2 diabetes mellitus with other diabetic neurological complication: Secondary | ICD-10-CM

## 2018-06-15 DIAGNOSIS — I10 Essential (primary) hypertension: Secondary | ICD-10-CM | POA: Diagnosis not present

## 2018-06-15 MED ORDER — FLUTICASONE-SALMETEROL 250-50 MCG/DOSE IN AEPB: 1.0000 | INHALATION_SPRAY | Freq: Two times a day (BID) | RESPIRATORY_TRACT | 1 refills | Status: DC

## 2018-06-15 MED ORDER — LISINOPRIL 5 MG PO TABS: 5.0000 mg | ORAL_TABLET | Freq: Every day | ORAL | 1 refills | Status: DC

## 2018-06-15 MED ORDER — ALBUTEROL SULFATE HFA 108 (90 BASE) MCG/ACT IN AERS: 1.0000 | INHALATION_SPRAY | Freq: Four times a day (QID) | RESPIRATORY_TRACT | 3 refills | Status: DC | PRN

## 2018-06-15 MED ORDER — METFORMIN HCL 500 MG PO TABS: 500.0000 mg | ORAL_TABLET | Freq: Two times a day (BID) | ORAL | 3 refills | Status: DC

## 2018-06-15 MED ORDER — CEPHALEXIN 500 MG PO CAPS: 500.0000 mg | ORAL_CAPSULE | Freq: Two times a day (BID) | ORAL | 0 refills | Status: DC

## 2018-06-15 NOTE — Progress Notes (Signed)
Virtual Visit via Telephone Note  I connected with Janet [name], on 06/15/2018 at 4:02 PM by telephone and verified that I am speaking with the correct person using two identifiers.   Consent: I discussed the limitations, risks, security and privacy concerns of performing an evaluation and management service by telephone and the availability of in person appointments. I also discussed with the patient that there may be a patient responsible charge related to this service. The patient expressed understanding and agreed to proceed.   Location of Patient: Home  Location of Provider: Clinic   Persons participating in Telemedicine visit: Elnora Quizon Farrington-CMA Dr. Felecia Shelling     History of Present Illness: Janet [name] is a 48 year old female with a history of type 2 diabetes mellitus (diet controlled with A1c of 5.8), Diabetic neuropathy, hypothyroidism, asthma, Seasonal allergies, endometrial adenocarcinoma (s/pTotal abdominal hysterectomy and bilateral salpingo-oophorectomy in 06/2016, adjuvant treatment due to low risk for recurrence),  morbid obesity who presents today for follow-up visit. She had sent a my chart message with an image of a boil on her right upper buttock which had been present for the last 4 days with associated redness, pain and no purulent discharge.  She popped it with minimal bloody discharge.  Denies fever, chills or myalgias.  She endorses polyuria, polydipsia and states 2 hours ago her sugar was 495.  Her diabetes has been diet controlled and she has not been checking her sugars regularly.  She also noticed frequent vaginal candidiasis and has needed Diflucan which she requested from the [facility].  She indulged in some Easter candy over the last couple of weeks and has not been exercising much. She has also noticed some sharp pains in her fingers.   Currently applying for disability and had her hearing recently but was told she  would need to wait 90 days for a response.  Past Medical History:  Diagnosis Date  . Anemia   . Anxiety   . Asthma   . Depression   . Hypothyroidism   . Pre-diabetes   . Right foot drop   . Sleep apnea   . Thyroid disease   . Uterine cancer (HCC)    Allergies  Allergen Reactions  . Ciprofloxacin Shortness Of Breath  . Shrimp [Shellfish Allergy] Other (See Comments)    positive allergy test    Current Outpatient Medications on File Prior to Visit  Medication Sig Dispense Refill  . albuterol (PROVENTIL HFA;VENTOLIN HFA) 108 (90 Base) MCG/ACT inhaler Inhale 1-2 puffs into the lungs every 6 (six) hours as needed for wheezing or shortness of breath. 54 g 3  . albuterol (PROVENTIL) (2.5 MG/3ML) 0.083% nebulizer solution Take 3 mLs (2.5 mg total) by nebulization every 6 (six) hours as needed for wheezing or shortness of breath. 75 mL 3  . cetirizine (ZYRTEC) 10 MG tablet Take 1 tablet by mouth once daily 90 tablet 0  . cholecalciferol (VITAMIN D) 1000 units tablet Take 1,000 Units by mouth at bedtime.    . DULoxetine (CYMBALTA) 60 MG capsule Take 1 capsule (60 mg total) by mouth daily. 90 capsule 4  . fluticasone (FLONASE) 50 MCG/ACT nasal spray Place 2 sprays into both nostrils daily. 16 g 6  . Fluticasone-Salmeterol (ADVAIR DISKUS) 250-50 MCG/DOSE AEPB Inhale 1 puff into the lungs 2 (two) times daily. 180 each 1  . levothyroxine (SYNTHROID, LEVOTHROID) 175 MCG tablet TAKE 1 TABLET BY MOUTH ONCE DAILY BEFORE BREAKFAST 90 tablet 0  . lisinopril (PRINIVIL,ZESTRIL) 5 MG tablet  Take 1 tablet (5 mg total) by mouth daily. 30 tablet 6  . montelukast (SINGULAIR) 10 MG tablet Take 1 tablet (10 mg total) by mouth at bedtime. 90 tablet 1  . OXcarbazepine (TRILEPTAL) 150 MG tablet Take 1 tablet (150 mg total) by mouth 2 (two) times daily. 180 tablet 1  . Pediatric Multivitamins-Iron (CHILDRENS MULTI-VITS/IRON PO) Take 1 tablet by mouth at bedtime.    . pregabalin (LYRICA) 75 MG capsule Take 1  capsule (75 mg total) by mouth 2 (two) times daily. 60 capsule 5  . acetaminophen-codeine (TYLENOL #3) 300-30 MG tablet Take 1 tablet by mouth at bedtime. (Patient not taking: Reported on 06/15/2018) 30 tablet 0  . cyclobenzaprine (FLEXERIL) 10 MG tablet Take 1 tablet (10 mg total) by mouth 2 (two) times daily as needed for muscle spasms. (Patient not taking: Reported on 06/15/2018) 60 tablet 2  . diazepam (VALIUM) 10 MG tablet Take 1 tablet po prior to injection. (Patient not taking: Reported on 06/15/2018) 1 tablet 0  . fluconazole (DIFLUCAN) 100 MG tablet Take 1 tablet (100 mg total) by mouth daily. Take one tablet once, may repeat in three days (Patient not taking: Reported on 06/15/2018) 2 tablet 0  . methocarbamol (ROBAXIN) 500 MG tablet Take 2 tablets (1,000 mg total) by mouth 2 (two) times daily. (Patient not taking: Reported on 06/15/2018) 120 tablet 1  . triamcinolone cream (KENALOG) 0.1 % Apply 1 application topically 2 (two) times daily. Prn itching (Patient not taking: Reported on 06/15/2018) 45 g 1   No current facility-administered medications on file prior to visit.     Observations/Objective: Alert, awake, oriented x3 Not in acute distress      CMP Latest Ref Rng & Units 12/16/2017 09/26/2017 09/17/2017  Glucose 70 - 99 mg/dL 127(H) 96 97  BUN 6 - 20 mg/dL _0 Creatinine 0.44 - 1.00 mg/dL 0.74 0.66 0.69  Sodium 135 - 145 mmol/L 143 138 145(H)  Potassium 3.5 - 5.1 mmol/L 4.0 3.8 4.0  Chloride 98 - 111 mmol/L 108 107 108(H)  CO2 22 - 32 mmol/L _1 Calcium 8.9 - 10.3 mg/dL 9.0 8.7(L) 8.9  Total Protein 6.0 - 8.5 g/dL - - 6.7  Total Bilirubin 0.0 - 1.2 mg/dL - - 0.6  Alkaline Phos 39 - 117 IU/L - - 137(H)  AST 0 - 40 IU/L - - 46(H)  ALT 0 - 32 IU/L - - 24    Lipid Panel     Component Value Date/Time   CHOL 126 09/17/2017 1042   TRIG 76 09/17/2017 1042   HDL 54 09/17/2017 1042   CHOLHDL 2.3 09/17/2017 1042   CHOLHDL 2.1 12/26/2015 0929   VLDL 15 12/26/2015 0929    LDLCALC 57 09/17/2017 1042    Lab Results  Component Value Date   HGBA1C 5.8 01/21/2018    Assessment and Plan: 1. Essential hypertension Lisinopril was commenced at last visit Counseled on blood pressure goal of less than 130/80, low-sodium, DASH diet, medication compliance, 150 minutes of moderate intensity exercise per week. Discussed medication compliance, adverse effects. - lisinopril (ZESTRIL) 5 MG tablet; Take 1 tablet (5 mg total) by mouth daily.  Dispense: 90 tablet; Refill: 1 - CMP14+EGFR; Future - Lipid panel; Future - Microalbumin/Creatinine Ratio, Urine; Future - Hemoglobin A1c; Future  2. Moderate persistent asthma without complication Stable - Fluticasone-Salmeterol (ADVAIR DISKUS) 250-50 MCG/DOSE AEPB; Inhale 1 puff into the lungs 2 (two) times daily.  Dispense: 180 each; Refill: 1 - albuterol (  VENTOLIN HFA) 108 (90 Base) MCG/ACT inhaler; Inhale 1-2 puffs into the lungs every 6 (six) hours as needed for wheezing or shortness of breath.  Dispense: 54 g; Refill: 3  3. Hypothyroidism, unspecified type Controlled - T4, free; Future - TSH; Future  4. Furuncle Advised to apply heat We will commence antibiotic due to ongoing hyperglycemia - cephALEXin (KEFLEX) 500 MG capsule; Take 1 capsule (500 mg total) by mouth 2 (two) times daily.  Dispense: 20 capsule; Refill: 0  5. Type 2 diabetes mellitus with other neurologic complication, without long-term current use of insulin ([place]) Uncontrolled Previously diet controlled with A1c of 5.8 in 01/2018 Recent nonadherence to diet and lifestyle modification could explain poor control with hyperglycemia 495 Commence metformin Counseled on blood pressure goal of less than 130/80, low-sodium, DASH diet, medication compliance, 150 minutes of moderate intensity exercise per week. Discussed medication compliance, adverse effects. Blood sugars improves and she gets hypoglycemic we could back up to 500 mg twice daily - metFORMIN  (GLUCOPHAGE) 500 MG tablet; Take 1 tablet (500 mg total) by mouth 2 (two) times daily with a meal.  Dispense: 120 tablet; Refill: 3   Follow Up Instructions: Return in about 3 months (around 09/15/2018).    I discussed the assessment and treatment plan with the patient. The patient was provided an opportunity to ask questions and all were answered. The patient agreed with the plan and demonstrated an understanding of the instructions.   The patient was advised to call back or seek an in-person evaluation if the symptoms worsen or if the condition fails to improve as anticipated.     I provided 25 minutes total of non-face-to-face time during this encounter including median intraservice time, reviewing previous notes, labs, imaging, medications and explaining diagnosis and management.     Charlott Rakes, MD, FAAFP. [facility] and [place], [place]   06/15/2018, 4:02 PM

## 2018-06-15 NOTE — Progress Notes (Signed)
Patient has been called and DOB has been verified. Patient has been screened and transferred to PCP to start phone visit.   Patient has been having back pain. Patient sent a picture through myChart.

## 2018-06-16 ENCOUNTER — Ambulatory Visit: Payer: Medicaid Other | Attending: Family Medicine

## 2018-06-16 DIAGNOSIS — E039 Hypothyroidism, unspecified: Secondary | ICD-10-CM

## 2018-06-16 DIAGNOSIS — I10 Essential (primary) hypertension: Secondary | ICD-10-CM

## 2018-06-17 ENCOUNTER — Telehealth: Payer: Self-pay | Admitting: Family Medicine

## 2018-06-17 ENCOUNTER — Telehealth: Payer: Self-pay

## 2018-06-17 DIAGNOSIS — E1149 Type 2 diabetes mellitus with other diabetic neurological complication: Secondary | ICD-10-CM

## 2018-06-17 LAB — MICROALBUMIN / CREATININE URINE RATIO
Creatinine, Urine: 245.4 mg/dL
Microalb/Creat Ratio: 9 mg/g creat (ref 0–29)
Microalbumin, Urine: 21.2 ug/mL

## 2018-06-17 LAB — CMP14+EGFR
ALT: 23 IU/L (ref 0–32)
AST: 33 IU/L (ref 0–40)
Albumin/Globulin Ratio: 1.4 (ref 1.2–2.2)
Albumin: 3.9 g/dL (ref 3.8–4.8)
Alkaline Phosphatase: 152 IU/L — ABNORMAL HIGH (ref 39–117)
BUN/Creatinine Ratio: 15 (ref 9–23)
BUN: 9 mg/dL (ref 6–24)
Bilirubin Total: 1.3 mg/dL — ABNORMAL HIGH (ref 0.0–1.2)
CO2: 24 mmol/L (ref 20–29)
Calcium: 9.4 mg/dL (ref 8.7–10.2)
Chloride: 98 mmol/L (ref 96–106)
Creatinine, Ser: 0.6 mg/dL (ref 0.57–1.00)
GFR calc Af Amer: 124 mL/min/{1.73_m2} (ref >59)
GFR calc non Af Amer: 107 mL/min/{1.73_m2} (ref >59)
Globulin, Total: 2.8 g/dL (ref 1.5–4.5)
Glucose: 330 mg/dL — ABNORMAL HIGH (ref 65–99)
Potassium: 4.5 mmol/L (ref 3.5–5.2)
Sodium: 138 mmol/L (ref 134–144)
Total Protein: 6.7 g/dL (ref 6.0–8.5)

## 2018-06-17 LAB — LIPID PANEL
Chol/HDL Ratio: 2.1 ratio (ref 0.0–4.4)
Cholesterol, Total: 136 mg/dL (ref 100–199)
HDL: 65 mg/dL (ref >39)
LDL Calculated: 54 mg/dL (ref 0–99)
Triglycerides: 87 mg/dL (ref 0–149)
VLDL Cholesterol Cal: 17 mg/dL (ref 5–40)

## 2018-06-17 LAB — HEMOGLOBIN A1C
Est. average glucose Bld gHb Est-mCnc: 240 mg/dL
Hgb A1c MFr Bld: 10 % — ABNORMAL HIGH (ref 4.8–5.6)

## 2018-06-17 LAB — T4, FREE: Free T4: 1.45 ng/dL (ref 0.82–1.77)

## 2018-06-17 LAB — TSH: TSH: 0.545 u[IU]/mL (ref 0.450–4.500)

## 2018-06-17 MED ORDER — METFORMIN HCL 500 MG PO TABS: 1000.0000 mg | ORAL_TABLET | Freq: Two times a day (BID) | ORAL | 3 refills | Status: DC

## 2018-06-17 NOTE — Telephone Encounter (Signed)
She should be taking 2 tablets 2 times a day.  I have fixed the sig

## 2018-06-17 NOTE — Telephone Encounter (Signed)
Patient was called and informed of lab results. 

## 2018-06-17 NOTE — Telephone Encounter (Signed)
Pt called in wanting to get update on results please follow up

## 2018-06-17 NOTE — Telephone Encounter (Signed)
Patient would like clarification on dosage of Metformin. She state that she was informed to take 2 tabs twice daily. Script says 1 tab twice a day.

## 2018-06-18 ENCOUNTER — Encounter: Payer: Self-pay | Admitting: Family Medicine

## 2018-06-18 ENCOUNTER — Telehealth: Payer: Self-pay

## 2018-06-18 DIAGNOSIS — B373 Candidiasis of vulva and vagina: Secondary | ICD-10-CM

## 2018-06-18 DIAGNOSIS — B3731 Acute candidiasis of vulva and vagina: Secondary | ICD-10-CM

## 2018-06-18 MED ORDER — FLUCONAZOLE 150 MG PO TABS: 150.0000 mg | ORAL_TABLET | Freq: Every day | ORAL | 1 refills | Status: DC

## 2018-06-18 NOTE — Addendum Note (Signed)
Addended by: Charlott Rakes on: 06/18/2018 02:38 PM   Modules accepted: Orders

## 2018-06-18 NOTE — Telephone Encounter (Signed)
mychart message has been sent to patient

## 2018-06-18 NOTE — Telephone Encounter (Signed)
Patient states that she is having itching due to her blood sugars being elevated. Patient would like a script for Diflucan or a cream to help with the itching.

## 2018-06-18 NOTE — Telephone Encounter (Signed)
I have sent a rx for Diflucan to her Pharmacy

## 2018-06-23 ENCOUNTER — Telehealth: Payer: Self-pay | Admitting: Family Medicine

## 2018-06-23 NOTE — Telephone Encounter (Signed)
Patients call returned.  Patient identified by name and date of birth.  Patient states that her boil has gone down but is still hard underneath the skin.    Patient advised to go to [facility] for further assessment.  Patient acknowledged understanding of advice.

## 2018-06-23 NOTE — Telephone Encounter (Signed)
Patient called to FU in regards to a boil hat is on her bottom. Patient states she took the antibiotic but that boil is still hard underneath  And would like to know if she should go to the ED or UC. Please follow up.

## 2018-07-10 ENCOUNTER — Encounter: Payer: Medicaid Other | Admitting: Physical Medicine & Rehabilitation

## 2018-07-21 ENCOUNTER — Encounter: Payer: Medicaid Other | Admitting: Physical Medicine & Rehabilitation

## 2018-07-30 ENCOUNTER — Other Ambulatory Visit: Payer: Self-pay

## 2018-07-30 ENCOUNTER — Encounter: Payer: Self-pay | Admitting: Physical Medicine & Rehabilitation

## 2018-07-30 ENCOUNTER — Encounter: Payer: Medicaid Other | Attending: Physical Medicine & Rehabilitation | Admitting: Physical Medicine & Rehabilitation

## 2018-07-30 DIAGNOSIS — Z9049 Acquired absence of other specified parts of digestive tract: Secondary | ICD-10-CM | POA: Diagnosis not present

## 2018-07-30 DIAGNOSIS — R7303 Prediabetes: Secondary | ICD-10-CM | POA: Insufficient documentation

## 2018-07-30 DIAGNOSIS — Z8559 Personal history of malignant neoplasm of other urinary tract organ: Secondary | ICD-10-CM | POA: Insufficient documentation

## 2018-07-30 DIAGNOSIS — G8929 Other chronic pain: Secondary | ICD-10-CM

## 2018-07-30 DIAGNOSIS — M533 Sacrococcygeal disorders, not elsewhere classified: Secondary | ICD-10-CM

## 2018-07-30 DIAGNOSIS — M545 Low back pain: Secondary | ICD-10-CM | POA: Diagnosis not present

## 2018-07-30 DIAGNOSIS — J45909 Unspecified asthma, uncomplicated: Secondary | ICD-10-CM | POA: Diagnosis not present

## 2018-07-30 NOTE — Progress Notes (Signed)
  PROCEDURE RECORD [facility]   Name: Janet [name] DOB:05-31-1970 MRN: [id number]  Date:07/30/2018  Physician: Alysia Penna, MD    Nurse/CMA: Hero Kulish CMA  Allergies:  Allergies  Allergen Reactions  . Ciprofloxacin Shortness Of Breath  . Shrimp [Shellfish Allergy] Other (See Comments)    positive allergy test    Consent Signed: Yes.    Is patient diabetic? Yes.    CBG today? 180   Pregnant: No. LMP: No LMP recorded (lmp unknown). Patient has had a hysterectomy. (age 40-55)  Anticoagulants: no Anti-inflammatory: no Antibiotics: no  Procedure: Left Sacroiliac injection Position: Prone   Start Time: 1052am End Time: 1054am Fluoro Time: 14s  RN/CMA Ignacia Gentzler CMA Oniyah Rohe CMA    Time 1033am 1101am    BP 126/77 139/83    Pulse 89 90    Respirations 16 16    O2 Sat 96 95    S/S 6 6    Pain Level 5/10 0/10     D/C home with Daiva Eves, [name] & O X 3, D/C instructions reviewed, and sits independently.

## 2018-07-30 NOTE — Patient Instructions (Signed)
Sacroiliac injection was performed today. A combination of a naming medicine plus a cortisone medicine was injected. The injection was done under x-ray guidance. This procedure has been performed to help reduce low back and buttocks pain as well as potentially hip pain. The duration of this injection is variable lasting from hours to  Months. It may repeated if needed. 

## 2018-07-30 NOTE — Progress Notes (Signed)
Left sacroiliac injection under fluoroscopic guidance  Indication: Left Low back and buttocks pain not relieved by medication management and other conservative care.  Informed consent was obtained after describing risks and benefits of the procedure with the patient, this includes bleeding, bruising, infection, paralysis and medication side effects. The patient wishes to proceed and has given written consent. The patient was placed in a prone position. The lumbar and sacral area was marked and prepped with Betadine. A 25-gauge 1-1/2 inch needle was inserted into the skin and subcutaneous tissue and 1 mL of 1% lidocaine was injected. Then a 22-gauge 5 inch spinal needle was inserted under fluoroscopic guidance into the left sacroiliac joint. AP and lateral images were utilized. Isovue 200x0.5 mL under live fluoroscopy demonstrated no intravascular uptake. Then a solution containing one ML of 6 mg per mLbetamethasone and 2 ML of 2% lidocaine MPF was injected x1.5 mL. Patient tolerated the procedure well. Post procedure instructions were given. Please see post procedure form.  100% pain relief reported post procedure, if injection wears off within 4wk will do  Left L5 Dorsal ramus, Left S1,2,3 lateral branch blocks under fluoro guidance

## 2018-08-24 ENCOUNTER — Other Ambulatory Visit: Payer: Self-pay | Admitting: Family Medicine

## 2018-08-24 DIAGNOSIS — E039 Hypothyroidism, unspecified: Secondary | ICD-10-CM

## 2018-08-24 DIAGNOSIS — J4540 Moderate persistent asthma, uncomplicated: Secondary | ICD-10-CM

## 2018-08-28 ENCOUNTER — Encounter: Payer: Medicaid Other | Admitting: Physical Medicine & Rehabilitation

## 2018-09-07 ENCOUNTER — Other Ambulatory Visit: Payer: Self-pay | Admitting: Family Medicine

## 2018-09-07 ENCOUNTER — Encounter: Payer: Self-pay | Admitting: Family Medicine

## 2018-09-07 MED ORDER — AMOXICILLIN-POT CLAVULANATE 875-125 MG PO TABS: 1.0000 | ORAL_TABLET | Freq: Two times a day (BID) | ORAL | 0 refills | Status: DC

## 2018-09-09 ENCOUNTER — Telehealth: Payer: Medicaid Other

## 2018-09-11 ENCOUNTER — Encounter: Payer: Medicaid Other | Admitting: Physical Medicine & Rehabilitation

## 2018-09-13 ENCOUNTER — Encounter: Payer: Self-pay | Admitting: Family Medicine

## 2018-09-22 ENCOUNTER — Telehealth (HOSPITAL_BASED_OUTPATIENT_CLINIC_OR_DEPARTMENT_OTHER): Payer: Medicaid Other | Admitting: Family Medicine

## 2018-09-22 ENCOUNTER — Other Ambulatory Visit: Payer: Self-pay

## 2018-09-22 ENCOUNTER — Encounter: Payer: Self-pay | Admitting: Family Medicine

## 2018-09-22 DIAGNOSIS — E1149 Type 2 diabetes mellitus with other diabetic neurological complication: Secondary | ICD-10-CM

## 2018-09-22 DIAGNOSIS — J320 Chronic maxillary sinusitis: Secondary | ICD-10-CM | POA: Diagnosis not present

## 2018-09-22 DIAGNOSIS — J302 Other seasonal allergic rhinitis: Secondary | ICD-10-CM

## 2018-09-22 DIAGNOSIS — G4709 Other insomnia: Secondary | ICD-10-CM

## 2018-09-22 MED ORDER — FLUTICASONE PROPIONATE 50 MCG/ACT NA SUSP: 2.0000 | Freq: Every day | NASAL | 6 refills | Status: DC

## 2018-09-22 MED ORDER — BENZONATATE 100 MG PO CAPS: 100.0000 mg | ORAL_CAPSULE | Freq: Three times a day (TID) | ORAL | 0 refills | Status: DC | PRN

## 2018-09-22 MED ORDER — VICTOZA 18 MG/3ML ~~LOC~~ SOPN: PEN_INJECTOR | SUBCUTANEOUS | 3 refills | Status: DC

## 2018-09-22 MED ORDER — HYDROXYZINE HCL 25 MG PO TABS: 25.0000 mg | ORAL_TABLET | Freq: Every evening | ORAL | 1 refills | Status: DC | PRN

## 2018-09-22 NOTE — Progress Notes (Signed)
Virtual Visit via Video Note  I connected with Janet [name], on 09/22/2018 at 9:52 AM by video enabled telemedicine device due to the COVID-19 pandemic and verified that I am speaking with the correct person using two identifiers.   Consent: I discussed the limitations, risks, security and privacy concerns of performing an evaluation and management service by telemedicine and the availability of in person appointments. I also discussed with the patient that there may be a patient responsible charge related to this service. The patient expressed understanding and agreed to proceed.   Location of Patient: Home  Location of Provider: Clinic   Persons participating in Telemedicine visit: Liseth Wann Farrington-CMA Dr. Felecia Shelling     History of Present Illness: Janet [name] is a 48 year old female with a history of type 2 diabetes mellitus (A1c of 10.0 in 06/2018), Diabetic neuropathy, hypothyroidism, asthma, Seasonal allergies, endometrial adenocarcinoma (s/pTotal abdominal hysterectomy and bilateral salpingo-oophorectomy in 06/2016, adjuvant treatment due to low risk for recurrence),  morbid obesity who presents today for an acute visit.  She complains of sinus congestion for the last 2 weeks and completed a course of Augmentin which I had prescribed for her with no relief in symptoms.  Her ears are clogged up, she has nasal stuffiness, cough/sputum, finds herself using her MDI more often.  She has also loss of sense of smell and taste but denies presence of fever, myalgia.  She has been with her son and denies contact with a person suspected to have COVID-19.  With regards to her sugars her fasting sugars have been 161 with a highest random sugar of 260.  She denies numbness in extremities no visual concerns.  She has been compliant with metformin. She is requesting something to help with insomnia as she recently put her dog to sleep and her son just lost his only  surviving grandparent.   Past Medical History:  Diagnosis Date  . Anemia   . Anxiety   . Asthma   . Depression   . Hypothyroidism   . Pre-diabetes   . Right foot drop   . Sleep apnea   . Thyroid disease   . Uterine cancer (HCC)    Allergies  Allergen Reactions  . Ciprofloxacin Shortness Of Breath  . Shrimp [Shellfish Allergy] Other (See Comments)    positive allergy test    Current Outpatient Medications on File Prior to Visit  Medication Sig Dispense Refill  . albuterol (PROVENTIL) (2.5 MG/3ML) 0.083% nebulizer solution Take 3 mLs (2.5 mg total) by nebulization every 6 (six) hours as needed for wheezing or shortness of breath. 75 mL 3  . albuterol (VENTOLIN HFA) 108 (90 Base) MCG/ACT inhaler Inhale 1-2 puffs into the lungs every 6 (six) hours as needed for wheezing or shortness of breath. 54 g 3  . cholecalciferol (VITAMIN D) 1000 units tablet Take 1,000 Units by mouth at bedtime.    . diazepam (VALIUM) 10 MG tablet Take 1 tablet po prior to injection. 1 tablet 0  . DULoxetine (CYMBALTA) 60 MG capsule Take 1 capsule (60 mg total) by mouth daily. 90 capsule 4  . EQ ALLERGY RELIEF, CETIRIZINE, 10 MG tablet Take 1 tablet by mouth once daily 90 tablet 0  . EUTHYROX 175 MCG tablet TAKE 1 TABLET BY MOUTH ONCE DAILY BEFORE BREAKFAST 90 tablet 0  . fluticasone (FLONASE) 50 MCG/ACT nasal spray Place 2 sprays into both nostrils daily. 16 g 6  . Fluticasone-Salmeterol (ADVAIR DISKUS) 250-50 MCG/DOSE AEPB Inhale 1 puff into the lungs  2 (two) times daily. 180 each 1  . lisinopril (ZESTRIL) 5 MG tablet Take 1 tablet (5 mg total) by mouth daily. 90 tablet 1  . metFORMIN (GLUCOPHAGE) 500 MG tablet Take 2 tablets (1,000 mg total) by mouth 2 (two) times daily with a meal. 120 tablet 3  . montelukast (SINGULAIR) 10 MG tablet TAKE 1 TABLET BY MOUTH AT BEDTIME 90 tablet 0  . OXcarbazepine (TRILEPTAL) 150 MG tablet Take 1 tablet (150 mg total) by mouth 2 (two) times daily. 180 tablet 1  . Pediatric  Multivitamins-Iron (CHILDRENS MULTI-VITS/IRON PO) Take 1 tablet by mouth at bedtime.    . pregabalin (LYRICA) 75 MG capsule Take 1 capsule (75 mg total) by mouth 2 (two) times daily. 60 capsule 5  . triamcinolone cream (KENALOG) 0.1 % Apply 1 application topically 2 (two) times daily. Prn itching 45 g 1  . acetaminophen-codeine (TYLENOL #3) 300-30 MG tablet Take 1 tablet by mouth at bedtime. (Patient not taking: Reported on 09/22/2018) 30 tablet 0  . amoxicillin-clavulanate (AUGMENTIN) 875-125 MG tablet Take 1 tablet by mouth 2 (two) times daily. 14 tablet 0  . cyclobenzaprine (FLEXERIL) 10 MG tablet Take 1 tablet (10 mg total) by mouth 2 (two) times daily as needed for muscle spasms. 60 tablet 2  . methocarbamol (ROBAXIN) 500 MG tablet Take 2 tablets (1,000 mg total) by mouth 2 (two) times daily. (Patient not taking: Reported on 09/22/2018) 120 tablet 1   No current facility-administered medications on file prior to visit.     Observations/Objective: Awake, alert, oriented x3 Not in acute distress No tenderness on palpation of her maxillary, frontal or ethmoidal sinuses  CMP Latest Ref Rng & Units 06/16/2018 12/16/2017 09/26/2017  Glucose 65 - 99 mg/dL 330(H) 127(H) 96  BUN 6 - 24 mg/dL 9 8 9   Creatinine 0.57 - 1.00 mg/dL 0.60 0.74 0.66  Sodium 134 - 144 mmol/L 138 143 138  Potassium 3.5 - 5.2 mmol/L 4.5 4.0 3.8  Chloride 96 - 106 mmol/L 98 108 107  CO2 20 - 29 mmol/L 24 28 25   Calcium 8.7 - 10.2 mg/dL 9.4 9.0 8.7(L)  Total Protein 6.0 - 8.5 g/dL 6.7 - -  Total Bilirubin 0.0 - 1.2 mg/dL 1.3(H) - -  Alkaline Phos 39 - 117 IU/L 152(H) - -  AST 0 - 40 IU/L 33 - -  ALT 0 - 32 IU/L 23 - -    Lipid Panel     Component Value Date/Time   CHOL 136 06/16/2018 0915   TRIG 87 06/16/2018 0915   HDL 65 06/16/2018 0915   CHOLHDL 2.1 06/16/2018 0915   CHOLHDL 2.1 12/26/2015 0929   VLDL 15 12/26/2015 0929   LDLCALC 54 06/16/2018 0915    Lab Results  Component Value Date   HGBA1C 10.0 (H)  06/16/2018     Assessment and Plan: 1. Seasonal allergies - fluticasone (FLONASE) 50 MCG/ACT nasal spray; Place 2 sprays into both nostrils daily.  Dispense: 16 g; Refill: 6  2. Chronic maxillary sinusitis Completed course of Augmentin Advised to resume Flonase which she has not been doing Advised to perform nasal irrigations 3 times daily will be beneficial She will need to be tested for COVID-19.  She has an urgent care center close to her home and will be going there to have this done - benzonatate (TESSALON) 100 MG capsule; Take 1 capsule (100 mg total) by mouth 3 (three) times daily as needed for cough.  Dispense: 30 capsule; Refill: 0  3.  Type 2 diabetes mellitus with other neurologic complication, without long-term current use of insulin (HCC) Uncontrolled with A1c of 10.0 She will benefit from addition of Victoza with regards to proper glycemic control, cardiovascular benefits and weight loss Will have her schedule with clinical pharmacist for education on use of Victoza and up titration Counseled on Diabetic diet, my plate method, 761 minutes of moderate intensity exercise/week Keep blood sugar logs with fasting goals of 80-120 mg/dl, random of less than 180 and in the event of sugars less than 60 mg/dl or greater than 400 mg/dl please notify the clinic ASAP. It is recommended that you undergo annual eye exams and annual foot exams. Pneumonia vaccine is recommended. - liraglutide (VICTOZA) 18 MG/3ML SOPN; Inject subcutaneously daily 0.6 mg for 1 week then 1.2 mg for 1 week then 1.8 mg thereafter  Dispense: 30 mL; Refill: 3  4. Other insomnia - hydrOXYzine (ATARAX/VISTARIL) 25 MG tablet; Take 1 tablet (25 mg total) by mouth at bedtime as needed.  Dispense: 30 tablet; Refill: 1   Follow Up Instructions: 3 months   I discussed the assessment and treatment plan with the patient. The patient was provided an opportunity to ask questions and all were answered. The patient agreed  with the plan and demonstrated an understanding of the instructions.   The patient was advised to call back or seek an in-person evaluation if the symptoms worsen or if the condition fails to improve as anticipated.     I provided 15 minutes total of Telehealth time during this encounter including median intraservice time, reviewing previous notes, labs, imaging, medications and explaining diagnosis and management.     Charlott Rakes, MD, FAAFP. [facility] and [place], [place]   09/22/2018, 9:52 AM

## 2018-09-22 NOTE — Progress Notes (Signed)
Patient has been called and DOB has been verified. Patient has been screened and transferred to PCP to start phone visit.   Patient has a sinus problem. Patient has taken a course of antibiotics and is not feeling any better.  Patient states that she can taste or smell anything.  Symptoms for 2 weeks now.

## 2018-09-25 ENCOUNTER — Encounter: Payer: Self-pay | Admitting: Family Medicine

## 2018-09-29 ENCOUNTER — Encounter: Payer: Self-pay | Admitting: Pharmacist

## 2018-09-29 ENCOUNTER — Telehealth (HOSPITAL_BASED_OUTPATIENT_CLINIC_OR_DEPARTMENT_OTHER): Payer: Medicaid Other | Admitting: Pharmacist

## 2018-09-29 DIAGNOSIS — Z79899 Other long term (current) drug therapy: Secondary | ICD-10-CM | POA: Diagnosis not present

## 2018-09-29 DIAGNOSIS — E1165 Type 2 diabetes mellitus with hyperglycemia: Secondary | ICD-10-CM

## 2018-09-29 MED ORDER — TRUEPLUS PEN NEEDLES 32G X 4 MM MISC: 11 refills | Status: DC

## 2018-09-29 NOTE — Progress Notes (Signed)
Virtual Visit via Telephone Note  I connected with Janet [name], on 09/29/2018 at 8:50 AM by telemedicinedue to the COVID-19 pandemic and verified that I am speaking with the correct person using two identifiers.  Consent: I discussed the limitations, risks, security and privacy concerns of performing an evaluation and management service by telemedicine and the availability of in person appointments. I also discussed with the patient that there may be a patient responsible charge related to this service. The patient expressed understanding and agreed to proceed.   Location of Patient: Home  Location of Provider: Clinic  Persons participating in Telemedicine visit: Janet [name]  Patient was educated on the use of the Victoza pen. She reported not having pen needles; rx sent. Reviewed necessary supplies and operation of the pen. Also reviewed goal blood glucose levels. All questions and concerns were addressed. I encouraged her to view YouTube video teachings and to ask the pharmacist how to attach pen needles once she picks these up from her pharmacy. She will cal back with further questions.   Benard Halsted, PharmD, [place] [phone]

## 2018-10-05 ENCOUNTER — Encounter: Payer: Self-pay | Admitting: Family Medicine

## 2018-10-06 ENCOUNTER — Encounter: Payer: Self-pay | Admitting: Family Medicine

## 2018-10-08 ENCOUNTER — Other Ambulatory Visit: Payer: Self-pay

## 2018-10-08 MED ORDER — DIAZEPAM 10 MG PO TABS: ORAL_TABLET | ORAL | 0 refills | Status: DC

## 2018-10-08 NOTE — Telephone Encounter (Signed)
Patient called requesting pre-procedure medication for upcoming fluoro appointment.  Called it into pharmacy per clinical protocol.

## 2018-10-12 ENCOUNTER — Encounter: Payer: Medicaid Other | Admitting: Physical Medicine & Rehabilitation

## 2018-11-03 ENCOUNTER — Encounter: Payer: Medicaid Other | Admitting: Physical Medicine & Rehabilitation

## 2018-11-09 ENCOUNTER — Other Ambulatory Visit: Payer: Self-pay

## 2018-11-09 ENCOUNTER — Encounter: Payer: Self-pay | Admitting: Family Medicine

## 2018-11-09 ENCOUNTER — Ambulatory Visit: Payer: Medicaid Other | Admitting: Physical Medicine & Rehabilitation

## 2018-11-09 ENCOUNTER — Ambulatory Visit: Payer: Medicaid Other | Attending: Family Medicine | Admitting: Family Medicine

## 2018-11-09 ENCOUNTER — Encounter: Payer: Medicaid Other | Admitting: Physical Medicine & Rehabilitation

## 2018-11-09 VITALS — BP 137/71 | HR 86 | Temp 98.2°F | Ht 67.0 in | Wt 381.0 lb

## 2018-11-09 DIAGNOSIS — E1149 Type 2 diabetes mellitus with other diabetic neurological complication: Secondary | ICD-10-CM | POA: Diagnosis not present

## 2018-11-09 DIAGNOSIS — Z1159 Encounter for screening for other viral diseases: Secondary | ICD-10-CM

## 2018-11-09 DIAGNOSIS — J302 Other seasonal allergic rhinitis: Secondary | ICD-10-CM

## 2018-11-09 DIAGNOSIS — J4540 Moderate persistent asthma, uncomplicated: Secondary | ICD-10-CM

## 2018-11-09 DIAGNOSIS — R21 Rash and other nonspecific skin eruption: Secondary | ICD-10-CM | POA: Diagnosis not present

## 2018-11-09 DIAGNOSIS — G4709 Other insomnia: Secondary | ICD-10-CM

## 2018-11-09 DIAGNOSIS — E039 Hypothyroidism, unspecified: Secondary | ICD-10-CM

## 2018-11-09 LAB — POCT GLYCOSYLATED HEMOGLOBIN (HGB A1C): HbA1c, POC (controlled diabetic range): 6.7 % (ref 0.0–7.0)

## 2018-11-09 LAB — GLUCOSE, POCT (MANUAL RESULT ENTRY): POC Glucose: 124 mg/dl — AB (ref 70–99)

## 2018-11-09 MED ORDER — FLUTICASONE-SALMETEROL 250-50 MCG/DOSE IN AEPB: 1.0000 | INHALATION_SPRAY | Freq: Two times a day (BID) | RESPIRATORY_TRACT | 1 refills | Status: DC

## 2018-11-09 MED ORDER — TRIAMCINOLONE ACETONIDE 0.1 % EX CREA: 1.0000 "application " | TOPICAL_CREAM | Freq: Two times a day (BID) | CUTANEOUS | 1 refills | Status: DC

## 2018-11-09 MED ORDER — MONTELUKAST SODIUM 10 MG PO TABS: 10.0000 mg | ORAL_TABLET | Freq: Every day | ORAL | 1 refills | Status: DC

## 2018-11-09 MED ORDER — HYDROXYZINE HCL 25 MG PO TABS: 25.0000 mg | ORAL_TABLET | Freq: Every evening | ORAL | 1 refills | Status: DC | PRN

## 2018-11-09 MED ORDER — FLUTICASONE PROPIONATE 50 MCG/ACT NA SUSP: 2.0000 | Freq: Every day | NASAL | 6 refills | Status: DC

## 2018-11-09 MED ORDER — VICTOZA 18 MG/3ML ~~LOC~~ SOPN: PEN_INJECTOR | SUBCUTANEOUS | 3 refills | Status: DC

## 2018-11-09 MED ORDER — ALBUTEROL SULFATE HFA 108 (90 BASE) MCG/ACT IN AERS: 1.0000 | INHALATION_SPRAY | Freq: Four times a day (QID) | RESPIRATORY_TRACT | 3 refills | Status: DC | PRN

## 2018-11-09 MED ORDER — ALBUTEROL SULFATE (2.5 MG/3ML) 0.083% IN NEBU: 2.5000 mg | INHALATION_SOLUTION | Freq: Four times a day (QID) | RESPIRATORY_TRACT | 3 refills | Status: DC | PRN

## 2018-11-09 NOTE — Progress Notes (Signed)
Subjective:  Patient ID: Janet [name], female    DOB: November 05, 1970  Age: 48 y.o. MRN: [id number]  CC: Diabetes and Shortness of Breath   HPI Janet [name] is a 48 year old female with a history of type 2 diabetes mellitus (A1c of 6.7), Diabetic neuropathy, hypothyroidism, asthma, Seasonal allergies, endometrial adenocarcinoma (s/pTotal abdominal hysterectomy and bilateral salpingo-oophorectomy in 06/2016, adjuvant treatment due to low risk for recurrence),  morbid obesity who presents today  Her A1c is 6.7 which is down from 10.0 previously ever since Victoza was commenced and she reports doing well on it with no hypoglycemic episodes.  She has an upcoming appointment for her eye exam with ophthalmology tomorrow but in the meantime denies visual concerns.  Diabetic neuropathy is controlled on current regimen. Compliant with levothyroxine for hypothyroidism and is doing well on her antihypertensive.  She complains of bouts of cough which is worse at night, occasionally productive and she sometimes loses her breath with going up and down the stairs.  She has had intermittent nasal congestion and wheezing.  In the last couple of months she has been treated with antibiotics such as Augmentin, Keflex, courses of prednisone with her last ED visit to [facility] 3 weeks ago where she received her last course of prednisone.  She also had COVID-19 test which was negative. Endorses compliance with Flonase, her antihistamine and her inhalers for asthma.  Denies fevers, facial pressure.  Past Medical History:  Diagnosis Date  . Anemia   . Anxiety   . Asthma   . Depression   . Hypothyroidism   . Pre-diabetes   . Right foot drop   . Sleep apnea   . Thyroid disease   . Uterine cancer [facility])     Past Surgical History:  Procedure Laterality Date  . ABDOMINAL HYSTERECTOMY    . CARPAL TUNNEL RELEASE    . CESAREAN SECTION    . CHOLECYSTECTOMY    . cyst removal     uterus  .  DILATION AND CURETTAGE OF UTERUS     still birth  . DILATION AND CURETTAGE OF UTERUS N/A 06/11/2016   Procedure: DILATATION AND CURETTAGE;  Surgeon: Emily Filbert, MD;  Location: [facility];  Service: Gynecology;  Laterality: N/A;  . ROBOTIC ASSISTED TOTAL HYSTERECTOMY WITH BILATERAL SALPINGO OOPHERECTOMY Bilateral 07/09/2016   Procedure: XI ROBOTIC ASSISTED TOTAL HYSTERECTOMY WITH BILATERAL SALPINGO OOPHORECTOMY FOR UTERUS GREATER THAN 250 GRAMS, LYSIS OF ADHESIONS;  Surgeon: Everitt Amber, MD;  Location: [facility];  Service: Gynecology;  Laterality: Bilateral;  . SINUS EXPLORATION    . WISDOM TOOTH EXTRACTION      Family History  Problem Relation Age of Onset  . Heart attack Mother   . Suicidality Father     Allergies  Allergen Reactions  . Ciprofloxacin Shortness Of Breath  . Shrimp [Shellfish Allergy] Other (See Comments)    positive allergy test    Outpatient Medications Prior to Visit  Medication Sig Dispense Refill  . cholecalciferol (VITAMIN D) 1000 units tablet Take 1,000 Units by mouth at bedtime.    . diazepam (VALIUM) 10 MG tablet Take 1 tablet po half to 1 hour prior to procedure. 1 tablet 0  . DULoxetine (CYMBALTA) 60 MG capsule Take 1 capsule (60 mg total) by mouth daily. 90 capsule 4  . EQ ALLERGY RELIEF, CETIRIZINE, 10 MG tablet Take 1 tablet by mouth once daily 90 tablet 0  . EUTHYROX 175 MCG tablet TAKE 1 TABLET BY MOUTH ONCE DAILY BEFORE BREAKFAST 90  tablet 0  . Insulin Pen Needle (TRUEPLUS PEN NEEDLES) 32G X 4 MM MISC Use to inject Victoza daily. 100 each 11  . lisinopril (ZESTRIL) 5 MG tablet Take 1 tablet (5 mg total) by mouth daily. 90 tablet 1  . metFORMIN (GLUCOPHAGE) 500 MG tablet Take 2 tablets (1,000 mg total) by mouth 2 (two) times daily with a meal. 120 tablet 3  . OXcarbazepine (TRILEPTAL) 150 MG tablet Take 1 tablet (150 mg total) by mouth 2 (two) times daily. 180 tablet 1  . Pediatric Multivitamins-Iron (CHILDRENS MULTI-VITS/IRON PO) Take 1 tablet by mouth at  bedtime.    . pregabalin (LYRICA) 75 MG capsule Take 1 capsule (75 mg total) by mouth 2 (two) times daily. 60 capsule 5  . albuterol (PROVENTIL) (2.5 MG/3ML) 0.083% nebulizer solution Take 3 mLs (2.5 mg total) by nebulization every 6 (six) hours as needed for wheezing or shortness of breath. 75 mL 3  . albuterol (VENTOLIN HFA) 108 (90 Base) MCG/ACT inhaler Inhale 1-2 puffs into the lungs every 6 (six) hours as needed for wheezing or shortness of breath. 54 g 3  . fluticasone (FLONASE) 50 MCG/ACT nasal spray Place 2 sprays into both nostrils daily. 16 g 6  . Fluticasone-Salmeterol (ADVAIR DISKUS) 250-50 MCG/DOSE AEPB Inhale 1 puff into the lungs 2 (two) times daily. 180 each 1  . hydrOXYzine (ATARAX/VISTARIL) 25 MG tablet Take 1 tablet (25 mg total) by mouth at bedtime as needed. 30 tablet 1  . liraglutide (VICTOZA) 18 MG/3ML SOPN Inject subcutaneously daily 0.6 mg for 1 week then 1.2 mg for 1 week then 1.8 mg thereafter 30 mL 3  . montelukast (SINGULAIR) 10 MG tablet TAKE 1 TABLET BY MOUTH AT BEDTIME 90 tablet 0  . triamcinolone cream (KENALOG) 0.1 % Apply 1 application topically 2 (two) times daily. Prn itching 45 g 1  . benzonatate (TESSALON) 100 MG capsule Take 1 capsule (100 mg total) by mouth 3 (three) times daily as needed for cough. (Patient not taking: Reported on 11/09/2018) 30 capsule 0  . methocarbamol (ROBAXIN) 500 MG tablet Take 2 tablets (1,000 mg total) by mouth 2 (two) times daily. (Patient not taking: Reported on 09/22/2018) 120 tablet 1   No facility-administered medications prior to visit.      ROS Review of Systems  Constitutional: Negative for activity change, appetite change and fatigue.  HENT: Negative for congestion, sinus pressure and sore throat.   Eyes: Negative for visual disturbance.  Respiratory: Positive for cough, shortness of breath and wheezing. Negative for chest tightness.   Cardiovascular: Negative for chest pain and palpitations.  Gastrointestinal: Negative  for abdominal distention, abdominal pain and constipation.  Endocrine: Negative for polydipsia.  Genitourinary: Negative for dysuria and frequency.  Musculoskeletal: Negative for arthralgias and back pain.  Skin: Positive for rash.  Neurological: Negative for tremors, light-headedness and numbness.  Hematological: Does not bruise/bleed easily.  Psychiatric/Behavioral: Negative for agitation and behavioral problems.    Objective:  BP 137/71   Pulse 86   Temp 98.2 F (36.8 C) (Oral)   Ht _0  (1.702 m)   Wt (!) 381 lb (172.8 kg)   LMP  (LMP Unknown) Comment: irregular periods previously, bleeding started in january  SpO2 94%   BMI 59.67 kg/m   BP/Weight 11/09/2018 07/30/2018 09/05/3662  Systolic BP 403 474 259  Diastolic BP 71 77 79  Wt. (Lbs) 381 380 383  BMI 59.67 59.52 59.99      Physical Exam Constitutional:  Appearance: She is well-developed. She is obese.  Neck:     Vascular: No JVD.  Cardiovascular:     Rate and Rhythm: Normal rate.     Heart sounds: Normal heart sounds. No murmur.  Pulmonary:     Effort: Pulmonary effort is normal.     Breath sounds: Normal breath sounds. No wheezing or rales.  Chest:     Chest wall: No tenderness.  Abdominal:     General: Bowel sounds are normal. There is no distension.     Palpations: Abdomen is soft. There is no mass.     Tenderness: There is no abdominal tenderness.  Musculoskeletal: Normal range of motion.     Right lower leg: No edema.     Left lower leg: No edema.  Neurological:     Mental Status: She is alert and oriented to person, place, and time.  Psychiatric:        Mood and Affect: Mood normal.     CMP Latest Ref Rng & Units 06/16/2018 12/16/2017 09/26/2017  Glucose 65 - 99 mg/dL 330(H) 127(H) 96  BUN 6 - 24 mg/dL _0 Creatinine 0.57 - 1.00 mg/dL 0.60 0.74 0.66  Sodium 134 - 144 mmol/L 138 143 138  Potassium 3.5 - 5.2 mmol/L 4.5 4.0 3.8  Chloride 96 - 106 mmol/L 98 108 107  CO2 20 - 29 mmol/L _1 Calcium 8.7 - 10.2 mg/dL 9.4 9.0 8.7(L)  Total Protein 6.0 - 8.5 g/dL 6.7 - -  Total Bilirubin 0.0 - 1.2 mg/dL 1.3(H) - -  Alkaline Phos 39 - 117 IU/L 152(H) - -  AST 0 - 40 IU/L 33 - -  ALT 0 - 32 IU/L 23 - -    Lipid Panel     Component Value Date/Time   CHOL 136 06/16/2018 0915   TRIG 87 06/16/2018 0915   HDL 65 06/16/2018 0915   CHOLHDL 2.1 06/16/2018 0915   CHOLHDL 2.1 12/26/2015 0929   VLDL 15 12/26/2015 0929   LDLCALC 54 06/16/2018 0915    CBC    Component Value Date/Time   WBC 2.7 (L) 10/15/2017 0951   WBC 3.0 (L) 09/26/2017 1925   RBC 4.76 10/15/2017 0951   RBC 4.79 09/26/2017 1925   HGB 12.0 10/15/2017 0951   HCT 37.2 10/15/2017 0951   PLT 87 (LL) 10/15/2017 0951   MCV 78 (L) 10/15/2017 0951   MCH 25.2 (L) 10/15/2017 0951   MCH 24.0 (L) 09/26/2017 1925   MCHC 32.3 10/15/2017 0951   MCHC 30.6 09/26/2017 1925   RDW 24.9 (H) 09/26/2017 1925   LYMPHSABS 0.7 10/15/2017 0951   MONOABS 0.2 07/02/2016 1035   EOSABS 0.1 10/15/2017 0951   BASOSABS 0.0 10/15/2017 0951    Lab Results  Component Value Date   HGBA1C 6.7 11/09/2018    Assessment & Plan:   1. Type 2 diabetes mellitus with other neurologic complication, without long-term current use of insulin (HCC) Significant improvement with A1c of 6.7 which is down from 10.0 previously-she has been commended Continue current regimen Eye exam comes up tomorrow with her ophthalmologist Commenced on atorvastatin as it does appear she was on it sometime in 2018 but this had fallen off somehow.   Counseled on Diabetic diet, my plate method, 149 minutes of moderate intensity exercise/week Keep blood sugar logs with fasting goals of 80-120 mg/dl, random of less than 180 and in the event of sugars less than 60 mg/dl or greater than 400  mg/dl please notify the clinic ASAP. It is recommended that you undergo annual eye exams and annual foot exams. Pneumonia vaccine is recommended. - POCT glucose (manual entry) -  POCT glycosylated hemoglobin (Hb A1C) - CMP14+EGFR - Lipid panel - liraglutide (VICTOZA) 18 MG/3ML SOPN; Inject subcutaneously daily 0.6 mg for 1 week then 1.2 mg for 1 week then 1.8 mg thereafter  Dispense: 30 mL; Refill: 3  2. Moderate persistent asthma without complication Uncontrolled probably exacerbated by underlying allergies and recurrent chronic sinusitis Maximized on current asthma therapy Will refer to allergy and asthma specialist - Pulmonary function test; Future - albuterol (PROVENTIL) (2.5 MG/3ML) 0.083% nebulizer solution; Take 3 mLs (2.5 mg total) by nebulization every 6 (six) hours as needed for wheezing or shortness of breath.  Dispense: 75 mL; Refill: 3 - albuterol (VENTOLIN HFA) 108 (90 Base) MCG/ACT inhaler; Inhale 1-2 puffs into the lungs every 6 (six) hours as needed for wheezing or shortness of breath.  Dispense: 54 g; Refill: 3 - Fluticasone-Salmeterol (ADVAIR DISKUS) 250-50 MCG/DOSE AEPB; Inhale 1 puff into the lungs 2 (two) times daily.  Dispense: 180 each; Refill: 1 - montelukast (SINGULAIR) 10 MG tablet; Take 1 tablet (10 mg total) by mouth at bedtime.  Dispense: 90 tablet; Refill: 1 - Ambulatory referral to Allergy  3. Seasonal allergies Uncontrolled In the last couple of months she has received courses of antibiotics and steroids with no relief Nasal irrigation recommended - fluticasone (FLONASE) 50 MCG/ACT nasal spray; Place 2 sprays into both nostrils daily.  Dispense: 16 g; Refill: 6 - Ambulatory referral to Allergy  4. Other insomnia Stable - hydrOXYzine (ATARAX/VISTARIL) 25 MG tablet; Take 1 tablet (25 mg total) by mouth at bedtime as needed.  Dispense: 30 tablet; Refill: 1  5. Hypothyroidism, unspecified type Controlled We will check thyroid panel and adjust regimen accordingly - TSH - T4, free  6. Screening for viral disease Preprocedure COVID-19 test ordered - Novel Coronavirus, NAA ([facility]); Future - HIV Antibody (routine testing w rflx)   7. Rash Unknown etiology - triamcinolone cream (KENALOG) 0.1 %; Apply 1 application topically 2 (two) times daily. Prn itching  Dispense: 45 g; Refill: 1   Health Care Maintenance: Next visit Meds ordered this encounter  Medications  . DISCONTD: triamcinolone cream (KENALOG) 0.1 %    Sig: Apply 1 application topically 2 (two) times daily. Prn itching    Dispense:  45 g    Refill:  1  . albuterol (PROVENTIL) (2.5 MG/3ML) 0.083% nebulizer solution    Sig: Take 3 mLs (2.5 mg total) by nebulization every 6 (six) hours as needed for wheezing or shortness of breath.    Dispense:  75 mL    Refill:  3  . albuterol (VENTOLIN HFA) 108 (90 Base) MCG/ACT inhaler    Sig: Inhale 1-2 puffs into the lungs every 6 (six) hours as needed for wheezing or shortness of breath.    Dispense:  54 g    Refill:  3  . fluticasone (FLONASE) 50 MCG/ACT nasal spray    Sig: Place 2 sprays into both nostrils daily.    Dispense:  16 g    Refill:  6  . Fluticasone-Salmeterol (ADVAIR DISKUS) 250-50 MCG/DOSE AEPB    Sig: Inhale 1 puff into the lungs 2 (two) times daily.    Dispense:  180 each    Refill:  1  . hydrOXYzine (ATARAX/VISTARIL) 25 MG tablet    Sig: Take 1 tablet (25 mg total) by mouth at bedtime as needed.  Dispense:  30 tablet    Refill:  1  . liraglutide (VICTOZA) 18 MG/3ML SOPN    Sig: Inject subcutaneously daily 0.6 mg for 1 week then 1.2 mg for 1 week then 1.8 mg thereafter    Dispense:  30 mL    Refill:  3  . montelukast (SINGULAIR) 10 MG tablet    Sig: Take 1 tablet (10 mg total) by mouth at bedtime.    Dispense:  90 tablet    Refill:  1  . triamcinolone cream (KENALOG) 0.1 %    Sig: Apply 1 application topically 2 (two) times daily. Prn itching    Dispense:  45 g    Refill:  1    Follow-up: Return in about 3 months (around 02/08/2019) for medical conditions.       Charlott Rakes, MD, FAAFP. [facility] and [facility], [place]    11/09/2018, 10:04 AM

## 2018-11-09 NOTE — Progress Notes (Signed)
Patient went to ED for SOB and was given medications, but still having SOB.

## 2018-11-10 LAB — CMP14+EGFR
ALT: 25 IU/L (ref 0–32)
AST: 41 IU/L — ABNORMAL HIGH (ref 0–40)
Albumin/Globulin Ratio: 1.8 (ref 1.2–2.2)
Albumin: 4.2 g/dL (ref 3.8–4.8)
Alkaline Phosphatase: 131 IU/L — ABNORMAL HIGH (ref 39–117)
BUN/Creatinine Ratio: 8 — ABNORMAL LOW (ref 9–23)
BUN: 6 mg/dL (ref 6–24)
Bilirubin Total: 1.6 mg/dL — ABNORMAL HIGH (ref 0.0–1.2)
CO2: 23 mmol/L (ref 20–29)
Calcium: 9.8 mg/dL (ref 8.7–10.2)
Chloride: 104 mmol/L (ref 96–106)
Creatinine, Ser: 0.73 mg/dL (ref 0.57–1.00)
GFR calc Af Amer: 111 mL/min/{1.73_m2} (ref >59)
GFR calc non Af Amer: 96 mL/min/{1.73_m2} (ref >59)
Globulin, Total: 2.4 g/dL (ref 1.5–4.5)
Glucose: 131 mg/dL — ABNORMAL HIGH (ref 65–99)
Potassium: 3.8 mmol/L (ref 3.5–5.2)
Sodium: 143 mmol/L (ref 134–144)
Total Protein: 6.6 g/dL (ref 6.0–8.5)

## 2018-11-10 LAB — LIPID PANEL
Chol/HDL Ratio: 2.1 ratio (ref 0.0–4.4)
Cholesterol, Total: 147 mg/dL (ref 100–199)
HDL: 71 mg/dL (ref >39)
LDL Chol Calc (NIH): 59 mg/dL (ref 0–99)
Triglycerides: 93 mg/dL (ref 0–149)
VLDL Cholesterol Cal: 17 mg/dL (ref 5–40)

## 2018-11-10 LAB — TSH: TSH: 0.027 u[IU]/mL — ABNORMAL LOW (ref 0.450–4.500)

## 2018-11-10 LAB — T4, FREE: Free T4: 1.71 ng/dL (ref 0.82–1.77)

## 2018-11-10 LAB — HIV ANTIBODY (ROUTINE TESTING W REFLEX): HIV Screen 4th Generation wRfx: NONREACTIVE

## 2018-11-10 MED ORDER — ATORVASTATIN CALCIUM 20 MG PO TABS: 20.0000 mg | ORAL_TABLET | Freq: Every day | ORAL | 3 refills | Status: DC

## 2018-11-10 MED ORDER — LEVOTHYROXINE SODIUM 150 MCG PO TABS: 150.0000 ug | ORAL_TABLET | Freq: Every day | ORAL | 0 refills | Status: DC

## 2018-11-11 ENCOUNTER — Encounter: Payer: Self-pay | Admitting: Family Medicine

## 2018-11-13 ENCOUNTER — Encounter: Payer: Medicaid Other | Attending: Physical Medicine & Rehabilitation | Admitting: Physical Medicine & Rehabilitation

## 2018-11-13 ENCOUNTER — Other Ambulatory Visit: Payer: Self-pay

## 2018-11-13 VITALS — BP 143/73 | HR 98 | Temp 97.5°F | Ht 67.0 in | Wt 383.4 lb

## 2018-11-13 DIAGNOSIS — M533 Sacrococcygeal disorders, not elsewhere classified: Secondary | ICD-10-CM | POA: Insufficient documentation

## 2018-11-13 DIAGNOSIS — G8929 Other chronic pain: Secondary | ICD-10-CM | POA: Diagnosis present

## 2018-11-13 NOTE — Progress Notes (Signed)
L5 dorsal ramus S1-S2-S3 lateral branch blocks under fluoroscopic guidance Left side  Indication:  Left low back and buttocks pain, mainly below L5 that has been temporarily relieved by Left intra articular fluoro guided sacroiliac injection  Informed consent was obtained after describing risks and benefits of the procedure with patient these include bleeding bruising and infection.  He elects to proceed and has given written consent.  Patient placed prone on fluoroscopy table Betadine prep sterile drape a 25-gauge 1.5 inch needle was used to anesthetize skin and subcu tissue with 1% lidocaine 1 cc into each of 4 sites.  Then a 22-gauge 5" needle was inserted under fluoroscopic guidance for starting the S1 SAP sacral ala junction.  Bone contact made.  Isovue 200 x 0.5 mL demonstrated no intravascular uptake then 0.5 mL of 2% lidocaine was injected.  Then the lateral aspect of the S1, S2, S3 foramen was targeted.  Bone contact made out, Isovue-200 times 0.5 mL demonstrated no nerve root or intravascular uptake within 0.5 mL of 2% lidocaine solution was injected after negative drawback for blood.  Patient tolerated procedure well.  Postinjection instructions given.

## 2018-11-13 NOTE — Patient Instructions (Signed)
Call, If 1 wk prior to injection you still have little pain

## 2018-11-13 NOTE — Progress Notes (Signed)
  PROCEDURE RECORD [facility]   Name: Chyvonne Lueke DOB:01/02/1971 MRN: [id number]  Date:11/13/2018  Physician: Alysia Penna, MD    Nurse/CMA: Truman Hayward, CMA  Allergies:  Allergies  Allergen Reactions  . Ciprofloxacin Shortness Of Breath  . Shrimp [Shellfish Allergy] Other (See Comments)    positive allergy test    Consent Signed: Yes.    Is patient diabetic? No.  CBG today?   Pregnant: No. LMP: No LMP recorded (lmp unknown). Patient has had a hysterectomy. (age [age])  Anticoagulants: no Anti-inflammatory: no Antibiotics: no  Procedure: Left L5 Dorsal Ramus S1,2,3 Lateral Branch Block Position: Prone Start Time: 12:51pm End Time: 1:00pm  Fluoro Time: 39  RN/CMA Samanthajo Payano,CMA Harlon Kutner, CMA    Time 12:45pm 1;02pm    BP 144/73 156/82    Pulse 98 98    Respirations 16 16    O2 Sat 93 94    S/S 6 6    Pain Level 5/10 1/10     D/C home with self, [name] & O X 3, D/C instructions reviewed, and sits independently.

## 2018-11-16 ENCOUNTER — Telehealth: Payer: Self-pay | Admitting: Family Medicine

## 2018-11-16 NOTE — Telephone Encounter (Signed)
Patient was called and a voicemail was left informing patient to return phone call.   If patient call back let her know that her appointment is 11/26/2018 at [place], she has to have a covid test 3 days prior to 11/26/2018.

## 2018-11-16 NOTE — Telephone Encounter (Signed)
Pt called to request an appointment for a Pulmonary function test, she has not been contacted for scheduling and was told to call back if not scheduled by now

## 2018-11-23 ENCOUNTER — Other Ambulatory Visit: Payer: Self-pay | Admitting: Family Medicine

## 2018-11-23 ENCOUNTER — Other Ambulatory Visit: Payer: Self-pay | Admitting: Neurology

## 2018-11-23 ENCOUNTER — Ambulatory Visit: Payer: Medicaid Other | Admitting: Allergy and Immunology

## 2018-11-23 DIAGNOSIS — E1149 Type 2 diabetes mellitus with other diabetic neurological complication: Secondary | ICD-10-CM

## 2018-11-24 ENCOUNTER — Telehealth: Payer: Self-pay | Admitting: *Deleted

## 2018-11-24 NOTE — Telephone Encounter (Signed)
Patient called back and scheduled appt for 10/20

## 2018-11-24 NOTE — Telephone Encounter (Signed)
Returned the patient's call and left a message to call the office back. Patient needs to be scheduled for a follow up appt

## 2018-11-26 ENCOUNTER — Inpatient Hospital Stay (HOSPITAL_COMMUNITY): Admission: RE | Admit: 2018-11-26 | Payer: Medicaid Other | Source: Ambulatory Visit

## 2018-11-26 ENCOUNTER — Other Ambulatory Visit: Payer: Self-pay

## 2018-11-26 ENCOUNTER — Ambulatory Visit (HOSPITAL_COMMUNITY)
Admission: RE | Admit: 2018-11-26 | Discharge: 2018-11-26 | Disposition: A | Discharge status: Home / Self Care | Payer: Medicaid Other | Source: Ambulatory Visit | Attending: Family Medicine | Admitting: Family Medicine

## 2018-11-26 DIAGNOSIS — J4540 Moderate persistent asthma, uncomplicated: Secondary | ICD-10-CM | POA: Insufficient documentation

## 2018-11-26 LAB — PULMONARY FUNCTION TEST
DL/VA % pred: 133 %
DL/VA: 5.63 ml/min/mmHg/L
DLCO unc % pred: 114 %
DLCO unc: 26.51 ml/min/mmHg
FEF 25-75 Post: 3.01 L/sec
FEF 25-75 Pre: 2.57 L/sec
FEF2575-%Change-Post: 16 %
FEF2575-%Pred-Post: 102 %
FEF2575-%Pred-Pre: 87 %
FEV1-%Change-Post: 4 %
FEV1-%Pred-Post: 86 %
FEV1-%Pred-Pre: 82 %
FEV1-Post: 2.68 L
FEV1-Pre: 2.57 L
FEV1FVC-%Change-Post: 0 %
FEV1FVC-%Pred-Pre: 101 %
FEV6-%Change-Post: 4 %
FEV6-%Pred-Post: 85 %
FEV6-%Pred-Pre: 82 %
FEV6-Post: 3.28 L
FEV6-Pre: 3.15 L
FEV6FVC-%Change-Post: 0 %
FEV6FVC-%Pred-Post: 102 %
FEV6FVC-%Pred-Pre: 102 %
FVC-%Change-Post: 4 %
FVC-%Pred-Post: 83 %
FVC-%Pred-Pre: 80 %
FVC-Post: 3.28 L
FVC-Pre: 3.15 L
Post FEV1/FVC ratio: 82 %
Post FEV6/FVC ratio: 100 %
Pre FEV1/FVC ratio: 82 %
Pre FEV6/FVC Ratio: 100 %
RV % pred: 116 %
RV: 2.26 L
TLC % pred: 103 %
TLC: 5.68 L

## 2018-11-26 MED ORDER — ALBUTEROL SULFATE (2.5 MG/3ML) 0.083% IN NEBU
2.5000 mg | INHALATION_SOLUTION | Freq: Once | RESPIRATORY_TRACT | Status: AC
  Administered 2018-11-26: 2.5 mg via RESPIRATORY_TRACT

## 2018-11-27 ENCOUNTER — Telehealth: Payer: Self-pay

## 2018-11-27 ENCOUNTER — Other Ambulatory Visit: Payer: Self-pay | Admitting: Gynecologic Oncology

## 2018-11-27 DIAGNOSIS — R590 Localized enlarged lymph nodes: Secondary | ICD-10-CM

## 2018-11-27 DIAGNOSIS — C541 Malignant neoplasm of endometrium: Secondary | ICD-10-CM

## 2018-11-27 NOTE — Telephone Encounter (Signed)
Gave Ms Janet [name] the CT appointment for 12-16-18 at 1100 at [facility].  Arrive at 1045. She needs to p/u the contrast and instructions at [place] prior to the scan. Pt verbalized understanding.

## 2018-11-30 ENCOUNTER — Encounter: Payer: Self-pay | Admitting: Family Medicine

## 2018-12-01 ENCOUNTER — Ambulatory Visit: Payer: Medicaid Other | Admitting: Gynecologic Oncology

## 2018-12-02 ENCOUNTER — Encounter: Payer: Self-pay | Admitting: Allergy and Immunology

## 2018-12-02 ENCOUNTER — Other Ambulatory Visit: Payer: Self-pay | Admitting: Neurology

## 2018-12-02 ENCOUNTER — Other Ambulatory Visit: Payer: Self-pay

## 2018-12-02 ENCOUNTER — Ambulatory Visit (INDEPENDENT_AMBULATORY_CARE_PROVIDER_SITE_OTHER): Payer: Medicaid Other | Admitting: Allergy and Immunology

## 2018-12-02 VITALS — BP 132/78 | HR 89 | Temp 98.3°F | Resp 18 | Ht 66.5 in | Wt 383.0 lb

## 2018-12-02 DIAGNOSIS — J4550 Severe persistent asthma, uncomplicated: Secondary | ICD-10-CM

## 2018-12-02 DIAGNOSIS — E662 Morbid (severe) obesity with alveolar hypoventilation: Secondary | ICD-10-CM

## 2018-12-02 DIAGNOSIS — J324 Chronic pansinusitis: Secondary | ICD-10-CM

## 2018-12-02 DIAGNOSIS — R0902 Hypoxemia: Secondary | ICD-10-CM

## 2018-12-02 DIAGNOSIS — Z79899 Other long term (current) drug therapy: Secondary | ICD-10-CM

## 2018-12-02 DIAGNOSIS — K219 Gastro-esophageal reflux disease without esophagitis: Secondary | ICD-10-CM | POA: Diagnosis not present

## 2018-12-02 DIAGNOSIS — E1149 Type 2 diabetes mellitus with other diabetic neurological complication: Secondary | ICD-10-CM

## 2018-12-02 MED ORDER — LOSARTAN POTASSIUM 50 MG PO TABS: 50.0000 mg | ORAL_TABLET | Freq: Every day | ORAL | 1 refills | Status: DC

## 2018-12-02 MED ORDER — OMEPRAZOLE 40 MG PO CPDR: 40.0000 mg | DELAYED_RELEASE_CAPSULE | Freq: Every day | ORAL | 5 refills | Status: DC

## 2018-12-02 MED ORDER — AMOXICILLIN-POT CLAVULANATE 875-125 MG PO TABS: 1.0000 | ORAL_TABLET | Freq: Two times a day (BID) | ORAL | 0 refills | Status: AC

## 2018-12-02 MED ORDER — BUDESONIDE-FORMOTEROL FUMARATE 160-4.5 MCG/ACT IN AERO: 2.0000 | INHALATION_SPRAY | Freq: Two times a day (BID) | RESPIRATORY_TRACT | 5 refills | Status: DC

## 2018-12-02 NOTE — Progress Notes (Signed)
[place] [facility] [place] - [place] - [place] [facility] [place]   Dear Dr. Margarita Rana,  Thank you for referring Janet [name] to the [facility] on 12/02/2018.   Below is a summation of this patient's evaluation and recommendations.  Thank you for your referral. I will keep you informed about this patient's response to treatment.   If you have any questions please do not hesitate to contact me.   Sincerely,  Jiles Prows, MD Allergy / Immunology [facility]    ______________________________________________________________________    NEW PATIENT NOTE  Referring Provider: Charlott Rakes, MD Primary Provider: Charlott Rakes, MD Date of office visit: 12/02/2018    Subjective:   Chief Complaint:  Janet [name] (DOB: 1970-08-29) is a 48 y.o. female who presents to the clinic on 12/02/2018 with a chief complaint of Asthma and Allergies .     HPI: Janet [name] presents to this clinic in evaluation of breathing problems.  Her major complaint is an event that has occurred over the course of the past 3 months.  She does have a long history of allergic rhinitis and asthma that she felt was under relatively good control with a combination of anti-inflammatory agents delivered to her airway including Advair and Flonase.  She would usually have a flareup during the spring and fall that was easily handled with a occasional antibiotic and a steroid.  Unfortunately, for the past 3 months, she has been coughing like crazy with laryngeal spasm described as inability to breathe and cannot speak about 3-4 times per day.  Her coughing is associated with retching and gagging and she urinates on herself.  It is disturbing her sleep.  She has also had issues with her upper airway over the course of the past 3 months.  Initially she had significant nasal congestion and anosmia and ugly nasal discharge.  Now she has  intermittent anosmia but she still makes ugly nasal discharge and has nasal congestion.  As well, she is completely out of breath especially if she exerts herself to any degree.  She also has reflux with regurgitation.  She does not treat this issue.  She has 2 coffees in the morning and has chocolate on most days.  She has had lisinopril started as an antihypertensive medication sometime in summer 2020.  She has had a cat and a dog introduced into the household June 2020.  Therapy has included the administration of azithromycin, cefdinir, systemic steroids, and her usual Advair and Flonase administration and albuterol..  She does not believe that any of these medications have helped her to any degree.  She has been given a "cough medicine" to use at night but this makes her quite sedated during the day and she does not use this medicine.  Past Medical History:  Diagnosis Date  . Anemia   . Angio-edema   . Anxiety   . Asthma   . Depression   . Hypothyroidism   . Pre-diabetes   . Right foot drop   . Sleep apnea   . Thyroid disease   . Urticaria   . Uterine cancer [facility])     Past Surgical History:  Procedure Laterality Date  . ABDOMINAL HYSTERECTOMY    . CARPAL TUNNEL RELEASE    . CESAREAN SECTION    . CHOLECYSTECTOMY    . cyst removal     uterus  . DILATION AND CURETTAGE OF UTERUS     still  birth  . DILATION AND CURETTAGE OF UTERUS N/A 06/11/2016   Procedure: DILATATION AND CURETTAGE;  Surgeon: Emily Filbert, MD;  Location: [facility];  Service: Gynecology;  Laterality: N/A;  . ROBOTIC ASSISTED TOTAL HYSTERECTOMY WITH BILATERAL SALPINGO OOPHERECTOMY Bilateral 07/09/2016   Procedure: XI ROBOTIC ASSISTED TOTAL HYSTERECTOMY WITH BILATERAL SALPINGO OOPHORECTOMY FOR UTERUS GREATER THAN 250 GRAMS, LYSIS OF ADHESIONS;  Surgeon: Everitt Amber, MD;  Location: [facility];  Service: Gynecology;  Laterality: Bilateral;  . SINOSCOPY    . SINUS EXPLORATION    . WISDOM TOOTH EXTRACTION      Allergies  as of 12/02/2018      Reactions   Ciprofloxacin Shortness Of Breath   Shrimp [shellfish Allergy] Other (See Comments)   positive allergy test      Medication List    albuterol (2.5 MG/3ML) 0.083% nebulizer solution Commonly known as: PROVENTIL Take 3 mLs (2.5 mg total) by nebulization every 6 (six) hours as needed for wheezing or shortness of breath.   albuterol 108 (90 Base) MCG/ACT inhaler Commonly known as: VENTOLIN HFA Inhale 1-2 puffs into the lungs every 6 (six) hours as needed for wheezing or shortness of breath.   atorvastatin 20 MG tablet Commonly known as: LIPITOR Take 1 tablet (20 mg total) by mouth daily.   cholecalciferol 1000 units tablet Commonly known as: VITAMIN D Take 1,000 Units by mouth at bedtime.   diazepam 10 MG tablet Commonly known as: VALIUM Take 1 tablet po half to 1 hour prior to procedure.   DULoxetine 60 MG capsule Commonly known as: Cymbalta Take 1 capsule (60 mg total) by mouth daily.   EQ Allergy Relief (Cetirizine) 10 MG tablet Generic drug: cetirizine Take 1 tablet by mouth once daily   fluticasone 50 MCG/ACT nasal spray Commonly known as: FLONASE Place 2 sprays into both nostrils daily.   Fluticasone-Salmeterol 250-50 MCG/DOSE Aepb Commonly known as: Advair Diskus Inhale 1 puff into the lungs 2 (two) times daily.   hydrOXYzine 25 MG tablet Commonly known as: ATARAX/VISTARIL Take 1 tablet (25 mg total) by mouth at bedtime as needed.   levothyroxine 150 MCG tablet Commonly known as: Euthyrox Take 1 tablet (150 mcg total) by mouth daily before breakfast.   lisinopril 5 MG tablet Commonly known as: ZESTRIL Take 1 tablet (5 mg total) by mouth daily.   metFORMIN 500 MG tablet Commonly known as: GLUCOPHAGE TAKE 2 TABLETS BY MOUTH TWICE DAILY WITH MEALS   methocarbamol 500 MG tablet Commonly known as: ROBAXIN Take 2 tablets (1,000 mg total) by mouth 2 (two) times daily.   montelukast 10 MG tablet Commonly known as:  SINGULAIR Take 1 tablet (10 mg total) by mouth at bedtime.   multivitamin tablet Take 1 tablet by mouth daily.   OXcarbazepine 150 MG tablet Commonly known as: TRILEPTAL Take 1 tablet by mouth twice daily   pregabalin 75 MG capsule Commonly known as: Lyrica Take 1 capsule (75 mg total) by mouth 2 (two) times daily.   promethazine-dextromethorphan 6.25-15 MG/5ML syrup Commonly known as: PROMETHAZINE-DM Take 5 mLs by mouth every 6 (six) hours as needed.   triamcinolone cream 0.1 % Commonly known as: KENALOG Apply 1 application topically 2 (two) times daily. Prn itching   TRUEplus Pen Needles 32G X 4 MM Misc Generic drug: Insulin Pen Needle Use to inject Victoza daily.   Victoza 18 MG/3ML Sopn Generic drug: liraglutide Inject subcutaneously daily 0.6 mg for 1 week then 1.2 mg for 1 week then 1.8 mg thereafter  Review of systems negative except as noted in HPI / PMHx or noted below:  Review of Systems  Constitutional: Negative.   HENT: Negative.   Eyes: Negative.   Respiratory: Negative.   Cardiovascular: Negative.   Gastrointestinal: Negative.   Genitourinary: Negative.   Musculoskeletal: Negative.   Skin: Negative.   Neurological: Negative.   Endo/Heme/Allergies: Negative.   Psychiatric/Behavioral: Negative.     Family History  Problem Relation Age of Onset  . Heart attack Mother   . Allergic rhinitis Mother   . Suicidality Father       Environmental and Social history  Lives in a townhouse with a dry environment, a cat and dog located inside the household, no carpet in the bedroom, plastic on the bed, no plastic on the pillow, no smoking ongoing with inside the household.  Objective:   Vitals:   12/02/18 1020 12/02/18 1021  BP:    Pulse:    Resp:    Temp:    SpO2: 97% 93%   Height: 5' 6.5" (168.9 cm) Weight: (!) 383 lb (173.7 kg)  Physical Exam Constitutional:      Appearance: She is not diaphoretic.     Comments: Coughing  HENT:      Head: Normocephalic.     Right Ear: Tympanic membrane, ear canal and external ear normal.     Left Ear: Tympanic membrane, ear canal and external ear normal.     Nose: Nose normal. No mucosal edema or rhinorrhea.     Mouth/Throat:     Pharynx: Uvula midline. No oropharyngeal exudate.  Eyes:     Conjunctiva/sclera: Conjunctivae normal.  Neck:     Thyroid: No thyromegaly.     Trachea: Trachea normal. No tracheal tenderness or tracheal deviation.  Cardiovascular:     Rate and Rhythm: Normal rate and regular rhythm.     Heart sounds: Normal heart sounds, S1 normal and S2 normal. No murmur.  Pulmonary:     Effort: No respiratory distress.     Breath sounds: Normal breath sounds. No stridor. No wheezing (Bilateral expiratory wheezes all lung fields) or rales.  Lymphadenopathy:     Head:     Right side of head: No tonsillar adenopathy.     Left side of head: No tonsillar adenopathy.     Cervical: No cervical adenopathy.  Skin:    Findings: No erythema or rash.     Nails: There is no clubbing.   Neurological:     Mental Status: She is alert.     Diagnostics: Allergy skin tests were performed.  She demonstrated hypersensitivity to dust mite and dog and slight hypersensitivity to cat.  Spirometry was performed and demonstrated an FEV1 of 3.06 @ 99 % of predicted. FEV1/FVC = 0.88  Oxygen saturation on room air at rest was 93%.  Oxygen saturation on room air during walking was 88%.  Oxygen saturation on 2 L nasal cannula during walking was 93%.  Results of blood tests obtained 15 October 2017 identified WBC 2.7, absolute eosinophil 100, absolute lymphocyte 700, hemoglobin 12.0, platelet 87.  Results of blood tests obtained 23 September 2017 identified negative ANA  Results of pulmonary function test obtained 26 November 2018 identified TLC 103%, RV 116%, DL/VA 133%  Results of a chest x-ray obtained 02 Jul 2016 identified the following:  The heart size and mediastinal contours are  within normal limits. Both lungs are clear. The visualized skeletal structures are Unremarkable.  Results of a sinus CT scan obtained 26 January 2016  identified the following:  There is subtle increased density in the left maxillary sinus consistent with mucoperiosteal thickening. No definite air-fluid levels are observed. The frontal, ethmoid, right maxillary, and sphenoid sinuses are grossly clear.  Results of an echocardiogram obtained 06 August 2017 identifies the following:  - Left ventricle: The cavity size was normal. Wall thickness was   increased in a pattern of mild LVH. Systolic function was normal.   The estimated ejection fraction was in the range of 60% to 65%.   Wall motion was normal; there were no regional wall motion   abnormalities. Features are consistent with a pseudonormal left   ventricular filling pattern, with concomitant abnormal relaxation   and increased filling pressure (grade 2 diastolic dysfunction). - Left atrium: The atrium was mildly dilated. - Tricuspid valve: There was mild regurgitation.  Results of a split-night sleep study obtained 08 September 2017 identified the following:  The patient was in for a split-night PSG. Study showed no significant OSA and mild to moderate snoring. Patient did not meet split-night criteria due to lack of events.   Assessment and Plan:    1. Not well controlled severe persistent asthma   2. Chronic pansinusitis   3. LPRD (laryngopharyngeal reflux disease)   4. On angiotensin-converting enzyme (ACE) inhibitors   5. Obesity hypoventilation syndrome ([place])   6. Exercise hypoxemia     1.  Allergen avoidance measures - dust mite / dog / cat  2.  Change lisinopril to losartan 50 mg - 1 tablet 1 time per day.  Check BP  3.  Treat and inflammation:   A.  Symbicort 160 - 2 inhalations twice per day  B.  Flonase - 1 spray each nostril twice a day  C.  Montelukast 10 mg - 1 tablet 1 time per day  D.  Prednisone 10 mg -  2 tablets 1 time per day  4.  Treat infection:   A.  Augmentin 875 - 1 tablet twice a day for 20 days  5.  Treat reflux:   A.  Slowly consolidate caffeine and chocolate consumption  B.  Omeprazole 40 mg - 1 tablet 1 time per day  6.  Oxygen 2 L nasal cannula during exertion  7.  Obtain nocturnal oximetry study on room air  8. Obtain Blood - CBC w/diff, IgE  9. Return to clinic in 1 week or earlier if problem  10.  Review sleep study results  Janet [name] has an inflamed and irritated respiratory tract secondary to her atopic disease, possible contribution from reflux induced respiratory disease, possible contribution from chronic sinusitis, and possible contribution from obesity hypoventilation syndrome.  She has rather significant oxygen desaturation during exertion.  We will place her on oxygen as well as a collection of anti-inflammatory agents for her airway, a broad-spectrum antibiotic, and a proton pump inhibitor, and remove her ACE inhibitor.  I will see her back in his clinic in 1 week to assess her response to this approach.    Jiles Prows, MD Allergy / Immunology [facility]

## 2018-12-02 NOTE — Patient Instructions (Addendum)
  1.  Allergen avoidance measures  2.  Change lisinopril to losartan 50 mg - 1 tablet 1 time per day.  Check BP  3.  Treat and inflammation:   A.  Symbicort 160 - 2 inhalations twice per day  B.  Flonase - 1 spray each nostril twice a day  C.  Montelukast 10 mg - 1 tablet 1 time per day  D.  Prednisone 10 mg - 2 tablets 1 time per day  4.  Treat infection:   A.  Augmentin 875 - 1 tablet twice a day for 20 days  5.  Treat reflux:   A.  Slowly consolidate caffeine and chocolate consumption  B.  Omeprazole 40 mg - 1 tablet 1 time per day  6.  Oxygen 2 L nasal cannula during exertion  7.  Obtain nocturnal oximetry study on room air  8. Obtain Blood - CBC w/diff, IgE  9. Return to clinic in 1 week or earlier if problem  10.  Review sleep study results

## 2018-12-03 ENCOUNTER — Encounter: Payer: Self-pay | Admitting: Allergy and Immunology

## 2018-12-03 ENCOUNTER — Other Ambulatory Visit: Payer: Self-pay

## 2018-12-03 MED ORDER — BUDESONIDE-FORMOTEROL FUMARATE 160-4.5 MCG/ACT IN AERO: 2.0000 | INHALATION_SPRAY | Freq: Two times a day (BID) | RESPIRATORY_TRACT | 5 refills | Status: DC

## 2018-12-04 ENCOUNTER — Telehealth: Payer: Self-pay

## 2018-12-04 NOTE — Telephone Encounter (Signed)
Patient name and DOB has been verified Patient was informed of lab results. Patient had no questions.  Patient states that she has seen the allergist.  The allergy doctor took her off her BP medication due to causing a cough.

## 2018-12-04 NOTE — Telephone Encounter (Signed)
-----   Message from Charlott Rakes, MD sent at 11/26/2018  1:17 PM EDT ----- Pulmonary function test is normal and does not explain her respiratory symptoms. I referred her to Allergy specialist and her referral is currently in their work queue; they will contact her with an appointment.

## 2018-12-09 ENCOUNTER — Ambulatory Visit: Payer: Self-pay | Admitting: Allergy and Immunology

## 2018-12-09 LAB — CBC WITH DIFFERENTIAL/PLATELET
Basophils Absolute: 0 10*3/uL (ref 0.0–0.2)
Basos: 1 %
EOS (ABSOLUTE): 0.1 10*3/uL (ref 0.0–0.4)
Eos: 2 %
Hematocrit: 43.4 % (ref 34.0–46.6)
Hemoglobin: 15 g/dL (ref 11.1–15.9)
Lymphocytes Absolute: 1 10*3/uL (ref 0.7–3.1)
Lymphs: 18 %
MCH: 30.9 pg (ref 26.6–33.0)
MCHC: 34.6 g/dL (ref 31.5–35.7)
MCV: 90 fL (ref 79–97)
Monocytes Absolute: 0.4 10*3/uL (ref 0.1–0.9)
Monocytes: 7 %
Neutrophils Absolute: 4.2 10*3/uL (ref 1.4–7.0)
Neutrophils: 72 %
Platelets: 84 10*3/uL — CL (ref 150–450)
RBC: 4.85 x10E6/uL (ref 3.77–5.28)
RDW: 13.7 % (ref 11.7–15.4)
WBC: 5.7 10*3/uL (ref 3.4–10.8)

## 2018-12-11 ENCOUNTER — Ambulatory Visit: Payer: Medicaid Other | Admitting: Physical Medicine & Rehabilitation

## 2018-12-16 ENCOUNTER — Ambulatory Visit (HOSPITAL_COMMUNITY): Payer: Medicaid Other

## 2018-12-16 ENCOUNTER — Ambulatory Visit: Payer: Medicaid Other | Admitting: Neurology

## 2018-12-16 NOTE — Progress Notes (Deleted)
PATIENT: Janet [name] DOB: 1971/02/11  REASON FOR VISIT: follow up HISTORY FROM: patient  HISTORY OF PRESENT ILLNESS: Today 12/16/18  HISTORY  Janet [name], is a 48 year old female, seen in refer by her primary care doctor  Arnoldo Morale, for evaluation of right foot drop, initial evaluation was on November twelfth 2018.  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  She noticed that the top of right toe went numb since April 2018, she described right toe numbness tingling, radiating paresthesia to the top of right foot, right lateral leg, also complains of low back pain, radiating pain to her right leg, getting worse after standing up since July 2018, she denies bowel and bladder incontinence, no left leg involvement,  I personally reviewed x-ray in October 2018, degenerative changes of lumbar spine with scoliosis concave to the left, no acute abnormality,  Laboratory evaluation in October 2018, glucose was 105, creatinine 0.7, lipid profile, cholesterol 131, LDL 50, normal CMP with creatinine of 0.69, decreased TSH 0.17, A1c 6.2, uric acid 4.7, CPK 78  Update April 17, 2017: MRI of lumbar in December 2018 showed mild degenerative changes, no significant stenosis, or  nerve root compression  MRI of right thigh showed no significant abnormality, no evidence of nerve damage.  PET scan October 2018 no evidence of hypermetabolic recurrent, metastatic disease, retroperitoneal adenopathy persistent, remains suspicious based on interval progression since May 2018  CT abdomen in February 2019, similar appearing aortocaval lymph nodes when compared to recent scan, metastatic disease to this location is not excluded, recommended follow-up scan, Hepatic cirrhosis, portal vein hypertension with splenomegaly,upper abdominal collateral vessels.  EMG nerve conduction study noticed significant active denervation at right tibialis anterior, peroneal longus, mild involvement of right biceps femoris short head.  With well preserved bilateral sural, and superficial peroneal sensory potentials, which raised the possibility of right sciatic neuropathy versus right lumbosacral radiculopathy.  She reported 25% improved, right foot is getting stronger, she still has low back pain, peritoneal pain.  UPDATE Sept 11 2019: She is still have significant right distal leg weakness, wear right ankle brace for long distance, taking Trileptal 150 mg twice a day, Cymbalta 60 mg twice a day, run out of Lyrica 75 twice a day, realize it has helped her right lower extremity deep achy pain,  she will have follow-up appointment with her oncologist Dr. Denman George in November, and planning will have a repeat CT abdomen in November 2019,   She is planning on to have bariatric surgery for weight loss in October 2019  Update December 17, 2018 SS:   REVIEW OF SYSTEMS: Out of a complete 14 system review of symptoms, the patient complains only of the following symptoms, and all other reviewed systems are negative.  ALLERGIES: Allergies  Allergen Reactions  . Ciprofloxacin Shortness Of Breath  . Shrimp [Shellfish Allergy] Other (See Comments)    positive allergy test  HOME MEDICATIONS:  Outpatient Medications Prior to Visit  Medication Sig Dispense Refill  . albuterol (PROVENTIL) (2.5 MG/3ML) 0.083% nebulizer solution Take 3 mLs (2.5 mg total) by nebulization every 6 (six) hours as needed for wheezing or shortness of breath. 75 mL 3  . albuterol (VENTOLIN HFA) 108 (90 Base) MCG/ACT inhaler Inhale 1-2 puffs into the lungs every 6 (six) hours as needed for wheezing or shortness of breath. 54 g 3  . amoxicillin-clavulanate (AUGMENTIN) 875-125 MG tablet Take 1 tablet by mouth 2 (two) times daily for 20 days. 40 tablet 0  . atorvastatin (LIPITOR) 20 MG tablet Take 1 tablet (20 mg total) by mouth daily. 30 tablet 3  . budesonide-formoterol (SYMBICORT) 160-4.5 MCG/ACT inhaler Inhale 2 puffs into the lungs 2 (two) times daily. 1 Inhaler 5  . cholecalciferol (VITAMIN D) 1000 units tablet Take 1,000 Units by mouth at bedtime.    . diazepam (VALIUM) 10 MG tablet Take 1 tablet po half to 1 hour prior to procedure. (Patient taking differently: Take 1 tablet po half to 1 hour prior to procedure. When needed) 1 tablet 0  . DULoxetine (CYMBALTA) 60 MG capsule Take 1 capsule (60 mg total) by mouth daily. 90 capsule 4  . EQ ALLERGY RELIEF, CETIRIZINE, 10 MG tablet Take 1 tablet by mouth once daily 90 tablet 0  . fluticasone (FLONASE) 50 MCG/ACT nasal spray Place 2 sprays into both nostrils daily. 16 g 6  . Fluticasone-Salmeterol (ADVAIR DISKUS) 250-50 MCG/DOSE AEPB Inhale 1 puff into the lungs 2 (two) times daily. (Patient taking differently: Inhale 2 puffs into the lungs 2 (two) times daily. ) 180 each 1  . hydrOXYzine (ATARAX/VISTARIL) 25 MG tablet Take 1 tablet (25 mg total) by mouth at bedtime as needed. 30 tablet 1  . Insulin Pen Needle (TRUEPLUS PEN NEEDLES) 32G X 4 MM MISC Use to inject Victoza daily. 100 each 11  . levothyroxine (SYNTHROID) 150 MCG tablet Take 1 tablet (150 mcg total) by mouth daily before breakfast. 90 tablet 0  . liraglutide (VICTOZA) 18 MG/3ML SOPN Inject subcutaneously  daily 0.6 mg for 1 week then 1.2 mg for 1 week then 1.8 mg thereafter 30 mL 3  . lisinopril (ZESTRIL) 5 MG tablet Take 1 tablet (5 mg total) by mouth daily. 90 tablet 1  . losartan (COZAAR) 50 MG tablet Take 1 tablet (50 mg total) by mouth daily. 30 tablet 1  . metFORMIN (GLUCOPHAGE) 500 MG tablet TAKE 2 TABLETS BY MOUTH TWICE DAILY WITH MEALS 120 tablet 0  . methocarbamol (ROBAXIN) 500 MG tablet Take 2 tablets (1,000 mg total) by mouth 2 (two) times daily. 120 tablet 1  . montelukast (SINGULAIR) 10 MG tablet Take 1 tablet (10 mg total) by mouth at bedtime. 90 tablet 1  . Multiple Vitamin (MULTIVITAMIN) tablet Take 1 tablet by mouth daily.    Marland Kitchen omeprazole (PRILOSEC) 40 MG capsule Take 1 capsule (40 mg total) by mouth daily. 30 capsule 5  . OXcarbazepine (TRILEPTAL) 150 MG tablet Take 1 tablet by mouth twice daily 180 tablet 0  . pregabalin (LYRICA) 75 MG capsule Take 1 capsule by mouth twice daily 60 capsule 0  . promethazine-dextromethorphan (PROMETHAZINE-DM) 6.25-15 MG/5ML syrup Take 5 mLs by mouth every 6 (six) hours as needed.    . triamcinolone cream (KENALOG) 0.1 % Apply 1 application topically 2 (two) times daily. Prn itching 45 g 1   No facility-administered medications prior to visit.     PAST MEDICAL HISTORY: Past Medical History:  Diagnosis Date  . Anemia   . Angio-edema   . Anxiety   . Asthma   . Depression   . Hypothyroidism   . Pre-diabetes   . Right foot drop   . Sleep apnea   . Thyroid disease   . Urticaria   . Uterine cancer ([place])     PAST SURGICAL HISTORY: Past Surgical History:  Procedure Laterality Date  . ABDOMINAL HYSTERECTOMY    . CARPAL TUNNEL RELEASE    . CESAREAN SECTION    . CHOLECYSTECTOMY    . cyst removal     uterus  . DILATION AND CURETTAGE OF UTERUS     still birth  . DILATION AND CURETTAGE OF UTERUS N/A 06/11/2016   Procedure: DILATATION AND CURETTAGE;  Surgeon: Emily Filbert, MD;  Location: [facility];  Service: Gynecology;  Laterality: N/A;  .  ROBOTIC ASSISTED TOTAL HYSTERECTOMY WITH BILATERAL SALPINGO OOPHERECTOMY Bilateral 07/09/2016   Procedure: XI ROBOTIC ASSISTED TOTAL HYSTERECTOMY WITH BILATERAL SALPINGO OOPHORECTOMY FOR UTERUS GREATER THAN 250 GRAMS, LYSIS OF ADHESIONS;  Surgeon: Everitt Amber, MD;  Location: [facility];  Service: Gynecology;  Laterality: Bilateral;  . SINOSCOPY    . SINUS EXPLORATION    . WISDOM TOOTH EXTRACTION      FAMILY HISTORY: Family History  Problem Relation Age of Onset  . Heart attack Mother   . Allergic rhinitis Mother   . Suicidality Father     SOCIAL HISTORY: Social History   Socioeconomic History  . Marital status: Widowed    Spouse name: Not on file  . Number of children: 1  . Years of education: college  . Highest education level: Not on file  Occupational History  . Occupation: Unemployed  Social Needs  . Financial resource strain: Not on file  . Food insecurity    Worry: Not on file    Inability: Not on file  . Transportation needs    Medical: Not on file    Non-medical: Not on file  Tobacco Use  . Smoking status: Never Smoker  . Smokeless tobacco: Never Used  Substance and Sexual Activity  . Alcohol use: No  . Drug use: No  . Sexual activity: Not Currently    Birth control/protection: None  Lifestyle  . Physical activity    Days per week: Not on file    Minutes per session: Not on file  . Stress: Not on file  Relationships  . Social Herbalist on phone: Not on file    Gets together: Not on file    Attends religious service: Not on file    Active member of club or organization: Not on file    Attends meetings of clubs or organizations: Not on file    Relationship status: Not on file  . Intimate partner violence    Fear of current or ex partner: Not on file    Emotionally abused: Not on file    Physically abused: Not on file    Forced sexual activity: Not on file  Other Topics Concern  . Not on file  Social History Narrative   Lives at home with her  son.   Right-handed.   2 cups caffeine per day.      PHYSICAL EXAM  There were no vitals filed for this visit. There is no height or weight on file to calculate BMI.  Generalized: Well developed, in no acute distress   Neurological examination  Mentation: Alert oriented to time, place, history taking. Follows  all commands speech and language fluent Cranial nerve II-XII: Pupils were equal round reactive to light. Extraocular movements were full, visual field were full on confrontational test. Facial sensation and strength were normal. Uvula tongue midline. Head turning and shoulder shrug  were normal and symmetric. Motor: The motor testing reveals 5 over 5 strength of all 4 extremities. Good symmetric motor tone is noted throughout.  Sensory: Sensory testing is intact to soft touch on all 4 extremities. No evidence of extinction is noted.  Coordination: Cerebellar testing reveals good finger-nose-finger and heel-to-shin bilaterally.  Gait and station: Gait is normal. Tandem gait is normal. Romberg is negative. No drift is seen.  Reflexes: Deep tendon reflexes are symmetric and normal bilaterally.   DIAGNOSTIC DATA (LABS, IMAGING, TESTING) - I reviewed patient records, labs, notes, testing and imaging myself where available.  Lab Results  Component Value Date   WBC 5.7 12/08/2018   HGB 15.0 12/08/2018   HCT 43.4 12/08/2018   MCV 90 12/08/2018   PLT 84 (LL) 12/08/2018      Component Value Date/Time   NA 143 11/09/2018 1016   K 3.8 11/09/2018 1016   CL 104 11/09/2018 1016   CO2 23 11/09/2018 1016   GLUCOSE 131 (H) 11/09/2018 1016   GLUCOSE 127 (H) 12/16/2017 0841   BUN 6 11/09/2018 1016   CREATININE 0.73 11/09/2018 1016   CREATININE 0.74 12/16/2017 0841   CREATININE 0.69 12/26/2015 0929   CALCIUM 9.8 11/09/2018 1016   PROT 6.6 11/09/2018 1016   ALBUMIN 4.2 11/09/2018 1016   AST 41 (H) 11/09/2018 1016   ALT 25 11/09/2018 1016   ALKPHOS 131 (H) 11/09/2018 1016   BILITOT  1.6 (H) 11/09/2018 1016   GFRNONAA 96 11/09/2018 1016   GFRNONAA >60 12/16/2017 0841   GFRNONAA >89 12/26/2015 0929   GFRAA 111 11/09/2018 1016   GFRAA >60 12/16/2017 0841   GFRAA >89 12/26/2015 0929   Lab Results  Component Value Date   CHOL 147 11/09/2018   HDL 71 11/09/2018   LDLCALC 59 11/09/2018   TRIG 93 11/09/2018   CHOLHDL 2.1 11/09/2018   Lab Results  Component Value Date   HGBA1C 6.7 11/09/2018   No results found for: [id number] Lab Results  Component Value Date   TSH 0.027 (L) 11/09/2018      ASSESSMENT AND PLAN 48 y.o. year old female  has a past medical history of Anemia, Angio-edema, Anxiety, Asthma, Depression, Hypothyroidism, Pre-diabetes, Right foot drop, Sleep apnea, Thyroid disease, Urticaria, and Uterine cancer ([place]). here with ***   I spent 15 minutes with the patient. 50% of this time was spent   Butler Denmark, [place], DNP 12/16/2018, 8:45 PM [facility] [address] [place], [place] [address] [phone]

## 2018-12-17 ENCOUNTER — Telehealth: Payer: Self-pay | Admitting: *Deleted

## 2018-12-17 ENCOUNTER — Ambulatory Visit (INDEPENDENT_AMBULATORY_CARE_PROVIDER_SITE_OTHER): Payer: Medicaid Other | Admitting: Allergy and Immunology

## 2018-12-17 ENCOUNTER — Encounter: Payer: Self-pay | Admitting: Allergy and Immunology

## 2018-12-17 ENCOUNTER — Encounter

## 2018-12-17 ENCOUNTER — Ambulatory Visit: Payer: Medicaid Other | Admitting: Neurology

## 2018-12-17 ENCOUNTER — Other Ambulatory Visit: Payer: Self-pay

## 2018-12-17 VITALS — BP 120/64 | HR 84 | Temp 98.4°F | Resp 18

## 2018-12-17 DIAGNOSIS — J324 Chronic pansinusitis: Secondary | ICD-10-CM

## 2018-12-17 DIAGNOSIS — J3089 Other allergic rhinitis: Secondary | ICD-10-CM | POA: Diagnosis not present

## 2018-12-17 DIAGNOSIS — J4550 Severe persistent asthma, uncomplicated: Secondary | ICD-10-CM

## 2018-12-17 DIAGNOSIS — E662 Morbid (severe) obesity with alveolar hypoventilation: Secondary | ICD-10-CM

## 2018-12-17 DIAGNOSIS — K219 Gastro-esophageal reflux disease without esophagitis: Secondary | ICD-10-CM

## 2018-12-17 DIAGNOSIS — G4733 Obstructive sleep apnea (adult) (pediatric): Secondary | ICD-10-CM

## 2018-12-17 MED ORDER — PREDNISONE 5 MG PO TABS: ORAL_TABLET | ORAL | 0 refills | Status: DC

## 2018-12-17 MED ORDER — DUPILUMAB 300 MG/2ML ~~LOC~~ SOSY
600.0000 mg | PREFILLED_SYRINGE | Freq: Once | SUBCUTANEOUS | Status: AC
  Administered 2018-12-17: 12:00:00 600 mg via SUBCUTANEOUS

## 2018-12-17 NOTE — Progress Notes (Signed)
Patient started loading dose (600mg ) of Dupixent- pre-filled pens.  I demonstrated how to self-inject with the demo and then patient self-administered both pens in her abdominal tissue without problem.  Patient currently self-administers Victoza and feels comfortable self-administering the Dupixent as well.   Consent form signed.

## 2018-12-17 NOTE — Progress Notes (Signed)
[facility] [place] [facility] [place]   Follow-up Note  Referring Provider: Charlott Rakes, MD Primary Provider: Charlott Rakes, MD Date of Office Visit: 12/17/2018  Subjective:   Janet [name] (DOB: Nov 07, 1970) is a 48 y.o. female who returns to the [facility] on 12/17/2018 in re-evaluation of the following:  HPI: Desia returns to this clinic in evaluation of severe asthma, allergic rhinitis, chronic sinusitis, suspected obesity hypoventilation syndrome, exercise-induced hypoxemia, and LPR.  Her last visit to this clinic was her initial evaluation on 02 December 2018 at which point in time we attempted to address each issue.  Even with removal of her ACE inhibitor and consistent use of anti-inflammatory agents including the use of systemic steroids and use of an antibiotic to address her chronic sinusitis she still has cough.  She still has cough that is quite severe associated with retching and gagging and urination.  Fortunately, her nose is not as congested and she can smell a little better though she still has intermittent anosmia.  Her reflux is much better and she no longer has any regurgitation.  Allergies as of 12/17/2018      Reactions   Ciprofloxacin Shortness Of Breath   Shrimp [shellfish Allergy] Other (See Comments)   positive allergy test      Medication List      albuterol (2.5 MG/3ML) 0.083% nebulizer solution Commonly known as: PROVENTIL Take 3 mLs (2.5 mg total) by nebulization every 6 (six) hours as needed for wheezing or shortness of breath.   albuterol 108 (90 Base) MCG/ACT inhaler Commonly known as: VENTOLIN HFA Inhale 1-2 puffs into the lungs every 6 (six) hours as needed for wheezing or shortness of breath.   amoxicillin-clavulanate 875-125 MG tablet Commonly known as: Augmentin Take 1 tablet by mouth 2 (two) times daily for 20 days.   atorvastatin 20 MG tablet Commonly known as: LIPITOR Take 1 tablet (20  mg total) by mouth daily.   budesonide-formoterol 160-4.5 MCG/ACT inhaler Commonly known as: Symbicort Inhale 2 puffs into the lungs 2 (two) times daily.   cholecalciferol 1000 units tablet Commonly known as: VITAMIN D Take 1,000 Units by mouth at bedtime.   diazepam 10 MG tablet Commonly known as: VALIUM Take 1 tablet po half to 1 hour prior to procedure. What changed: additional instructions   DULoxetine 60 MG capsule Commonly known as: Cymbalta Take 1 capsule (60 mg total) by mouth daily.   EQ Allergy Relief (Cetirizine) 10 MG tablet Generic drug: cetirizine Take 1 tablet by mouth once daily   fluticasone 50 MCG/ACT nasal spray Commonly known as: FLONASE Place 2 sprays into both nostrils daily.   Fluticasone-Salmeterol 250-50 MCG/DOSE Aepb Commonly known as: Advair Diskus Inhale 1 puff into the lungs 2 (two) times daily. What changed: how much to take   hydrOXYzine 25 MG tablet Commonly known as: ATARAX/VISTARIL Take 1 tablet (25 mg total) by mouth at bedtime as needed.   levothyroxine 150 MCG tablet Commonly known as: Euthyrox Take 1 tablet (150 mcg total) by mouth daily before breakfast.   lisinopril 5 MG tablet Commonly known as: ZESTRIL Take 1 tablet (5 mg total) by mouth daily.   losartan 50 MG tablet Commonly known as: COZAAR Take 1 tablet (50 mg total) by mouth daily.   metFORMIN 500 MG tablet Commonly known as: GLUCOPHAGE TAKE 2 TABLETS BY MOUTH TWICE DAILY WITH MEALS   methocarbamol 500 MG tablet Commonly known as: ROBAXIN Take 2 tablets (1,000 mg total)  by mouth 2 (two) times daily.   montelukast 10 MG tablet Commonly known as: SINGULAIR Take 1 tablet (10 mg total) by mouth at bedtime.   multivitamin tablet Take 1 tablet by mouth daily.   omeprazole 40 MG capsule Commonly known as: PRILOSEC Take 1 capsule (40 mg total) by mouth daily.   OXcarbazepine 150 MG tablet Commonly known as: TRILEPTAL Take 1 tablet by mouth twice daily    pregabalin 75 MG capsule Commonly known as: LYRICA Take 1 capsule by mouth twice daily   promethazine-dextromethorphan 6.25-15 MG/5ML syrup Commonly known as: PROMETHAZINE-DM Take 5 mLs by mouth every 6 (six) hours as needed.   triamcinolone cream 0.1 % Commonly known as: KENALOG Apply 1 application topically 2 (two) times daily. Prn itching   TRUEplus Pen Needles 32G X 4 MM Misc Generic drug: Insulin Pen Needle Use to inject Victoza daily.   Victoza 18 MG/3ML Sopn Generic drug: liraglutide Inject subcutaneously daily 0.6 mg for 1 week then 1.2 mg for 1 week then 1.8 mg thereafter       Past Medical History:  Diagnosis Date  . Anemia   . Angio-edema   . Anxiety   . Asthma   . Depression   . Hypothyroidism   . Pre-diabetes   . Right foot drop   . Sleep apnea   . Thyroid disease   . Urticaria   . Uterine cancer [facility])     Past Surgical History:  Procedure Laterality Date  . ABDOMINAL HYSTERECTOMY    . CARPAL TUNNEL RELEASE    . CESAREAN SECTION    . CHOLECYSTECTOMY    . cyst removal     uterus  . DILATION AND CURETTAGE OF UTERUS     still birth  . DILATION AND CURETTAGE OF UTERUS N/A 06/11/2016   Procedure: DILATATION AND CURETTAGE;  Surgeon: Emily Filbert, MD;  Location: [facility];  Service: Gynecology;  Laterality: N/A;  . ROBOTIC ASSISTED TOTAL HYSTERECTOMY WITH BILATERAL SALPINGO OOPHERECTOMY Bilateral 07/09/2016   Procedure: XI ROBOTIC ASSISTED TOTAL HYSTERECTOMY WITH BILATERAL SALPINGO OOPHORECTOMY FOR UTERUS GREATER THAN 250 GRAMS, LYSIS OF ADHESIONS;  Surgeon: Everitt Amber, MD;  Location: [facility];  Service: Gynecology;  Laterality: Bilateral;  . SINOSCOPY    . SINUS EXPLORATION    . WISDOM TOOTH EXTRACTION      Review of systems negative except as noted in HPI / PMHx or noted below:  Review of Systems  Constitutional: Negative.   HENT: Negative.   Eyes: Negative.   Respiratory: Negative.   Cardiovascular: Negative.   Gastrointestinal: Negative.    Genitourinary: Negative.   Musculoskeletal: Negative.   Skin: Negative.   Neurological: Negative.   Endo/Heme/Allergies: Negative.   Psychiatric/Behavioral: Negative.      Objective:   Vitals:   12/17/18 1427  BP: 120/64  Pulse: 84  Resp: 18  Temp: 98.4 F (36.9 C)  SpO2: 94%          Physical Exam Constitutional:      Appearance: She is not diaphoretic.  HENT:     Head: Normocephalic.     Right Ear: Tympanic membrane, ear canal and external ear normal.     Left Ear: Tympanic membrane, ear canal and external ear normal.     Nose: Nose normal. No mucosal edema or rhinorrhea.     Mouth/Throat:     Pharynx: Uvula midline. No oropharyngeal exudate.  Eyes:     Conjunctiva/sclera: Conjunctivae normal.  Neck:     Thyroid: No thyromegaly.  Trachea: Trachea normal. No tracheal tenderness or tracheal deviation.  Cardiovascular:     Rate and Rhythm: Normal rate and regular rhythm.     Heart sounds: Normal heart sounds, S1 normal and S2 normal. No murmur.  Pulmonary:     Effort: No respiratory distress.     Breath sounds: No stridor. Wheezing (Bilateral inspiratory and expiratory wheezes all lung fields) present. No rales.  Lymphadenopathy:     Head:     Right side of head: No tonsillar adenopathy.     Left side of head: No tonsillar adenopathy.     Cervical: No cervical adenopathy.  Skin:    Findings: No erythema or rash.     Nails: There is no clubbing.   Neurological:     Mental Status: She is alert.     Diagnostics:    Spirometry was performed and demonstrated an FEV1 of 3.32 at 107 % of predicted.  Results of a nocturnal oximetry study collected on room air identified 3.4 minutes spent with oxygen saturation below 88% with a nadir of 86%, 59 oxygen desaturation events for an ODI of 14.  Results of blood tests obtained 08 December 2018 identified WBC 5.7, absolute eosinophil 100, absolute lymphocyte 1000, hemoglobin 15, platelet 84.  Assessment and Plan:    1. Not well controlled severe persistent asthma   2. Chronic pansinusitis   3. Other allergic rhinitis   4. Obesity hypoventilation syndrome ([place])   5. LPRD (laryngopharyngeal reflux disease)   6. Obstructive sleep apnea syndrome     1.  Perform allergen avoidance measures -wash animals & out of bedroom, HEPA filter  2.  Continue to treat and inflammation:   A.  Symbicort 160 - 2 inhalations twice per day  B.  Flonase - 1 spray each nostril twice a day  C.  Montelukast 10 mg - 1 tablet 1 time per day  D.  Start dupilumab injections today and every 2 weeks  4.  Finished treatment for infection:   A.  Augmentin 875 - 1 tablet twice a day for 20 days  5.  Treat reflux:   A.  Slowly consolidate caffeine and chocolate consumption  B.  Omeprazole 40 mg - 1 tablet 1 time per day  6.  Oxygen 2 L nasal cannula during exertion  7.  Obtain home sleep study for sleep apnea  8.  Decrease prednisone to 15 mg daily for 7days, then 10 mg daily for 7 days, then 5 mg daily for 7 days  9. Return to clinic in 2 week or earlier if problem  Barnetta Chapel still has very significant inflammation of her airway and she has not responded well to anti-inflammatory agents including a systemic steroid and empiric therapy for continued chronic sinusitis with Augmentin.  We will now start her on dupilumab.  Her eosinophil count is not that high but it should be noted that her eosinophil count was obtained at a point in time in which she was using daily systemic steroids.  We will attempt to taper down her daily systemic steroids with a tapering dose as noted above.  I have encouraged her to perform allergen avoidance measures against various aeroallergens located inside her household.  We do need to obtain a home sleep study given her oxygen desaturations while she sleeps.  I will see her back in this clinic in 2 weeks or earlier if there is a problem.  Allena Katz, MD Allergy / Immunology [place]

## 2018-12-17 NOTE — Patient Instructions (Addendum)
  1.  Perform allergen avoidance measures -wash animals & out of bedroom, HEPA filter  2.  Continue to treat and inflammation:   A.  Symbicort 160 - 2 inhalations twice per day  B.  Flonase - 1 spray each nostril twice a day  C.  Montelukast 10 mg - 1 tablet 1 time per day  D.  Start dupilumab injections today and every 2 weeks  4.  Finished treatment for infection:   A.  Augmentin 875 - 1 tablet twice a day for 20 days  5.  Treat reflux:   A.  Slowly consolidate caffeine and chocolate consumption  B.  Omeprazole 40 mg - 1 tablet 1 time per day  6.  Oxygen 2 L nasal cannula during exertion  7.  Obtain home sleep study for sleep apnea  8.  Decrease prednisone to 15 mg daily for 7days, then 10 mg daily for 7 days, then 5 mg daily for 7 days  9. Return to clinic in 2 week or earlier if problem

## 2018-12-17 NOTE — Telephone Encounter (Signed)
L/M for patient to contact me so I can advise approval and submit of Rx to [facility].

## 2018-12-21 ENCOUNTER — Encounter: Payer: Self-pay | Admitting: Neurology

## 2018-12-21 ENCOUNTER — Encounter: Payer: Self-pay | Admitting: Allergy and Immunology

## 2018-12-21 ENCOUNTER — Other Ambulatory Visit: Payer: Self-pay

## 2018-12-21 DIAGNOSIS — G4733 Obstructive sleep apnea (adult) (pediatric): Secondary | ICD-10-CM

## 2018-12-21 NOTE — Telephone Encounter (Signed)
Patient advised.

## 2018-12-23 ENCOUNTER — Encounter: Payer: Self-pay | Admitting: *Deleted

## 2018-12-24 ENCOUNTER — Encounter: Payer: Medicaid Other | Admitting: Physical Medicine & Rehabilitation

## 2018-12-24 ENCOUNTER — Ambulatory Visit (HOSPITAL_COMMUNITY)
Admission: RE | Admit: 2018-12-24 | Discharge: 2018-12-24 | Disposition: A | Discharge status: Home / Self Care | Payer: Medicaid Other | Source: Ambulatory Visit | Attending: Gynecologic Oncology | Admitting: Gynecologic Oncology

## 2018-12-24 ENCOUNTER — Other Ambulatory Visit: Payer: Self-pay

## 2018-12-24 DIAGNOSIS — C541 Malignant neoplasm of endometrium: Secondary | ICD-10-CM | POA: Diagnosis present

## 2018-12-24 DIAGNOSIS — R590 Localized enlarged lymph nodes: Secondary | ICD-10-CM | POA: Diagnosis present

## 2018-12-24 MED ORDER — IOHEXOL 300 MG/ML  SOLN
100.0000 mL | Freq: Once | INTRAMUSCULAR | Status: AC | PRN
  Administered 2018-12-24: 100 mL via INTRAVENOUS

## 2018-12-24 MED ORDER — SODIUM CHLORIDE (PF) 0.9 % IJ SOLN
INTRAMUSCULAR | Status: AC
  Filled 2018-12-24: qty 50

## 2018-12-26 ENCOUNTER — Other Ambulatory Visit: Payer: Self-pay | Admitting: Family Medicine

## 2018-12-26 DIAGNOSIS — E1149 Type 2 diabetes mellitus with other diabetic neurological complication: Secondary | ICD-10-CM

## 2018-12-29 ENCOUNTER — Encounter: Payer: Self-pay | Admitting: Gynecologic Oncology

## 2018-12-29 NOTE — Telephone Encounter (Signed)
Spoke with Janet [name] and told her that Dr. Denman George and Lenna Sciara have been in surgery the last several work days.  Dr. Denman George will review the results at her visit tomorrow. Pt verbalized understanding.

## 2018-12-30 ENCOUNTER — Encounter: Payer: Self-pay | Admitting: Allergy and Immunology

## 2018-12-30 ENCOUNTER — Ambulatory Visit (INDEPENDENT_AMBULATORY_CARE_PROVIDER_SITE_OTHER): Payer: Medicaid Other | Admitting: Allergy and Immunology

## 2018-12-30 ENCOUNTER — Other Ambulatory Visit: Payer: Self-pay

## 2018-12-30 ENCOUNTER — Inpatient Hospital Stay: Payer: Medicaid Other | Admitting: Gynecologic Oncology

## 2018-12-30 VITALS — BP 150/90 | HR 84 | Temp 98.1°F | Resp 20

## 2018-12-30 DIAGNOSIS — J324 Chronic pansinusitis: Secondary | ICD-10-CM

## 2018-12-30 DIAGNOSIS — J3089 Other allergic rhinitis: Secondary | ICD-10-CM | POA: Diagnosis not present

## 2018-12-30 DIAGNOSIS — G4733 Obstructive sleep apnea (adult) (pediatric): Secondary | ICD-10-CM

## 2018-12-30 DIAGNOSIS — E662 Morbid (severe) obesity with alveolar hypoventilation: Secondary | ICD-10-CM

## 2018-12-30 DIAGNOSIS — R0902 Hypoxemia: Secondary | ICD-10-CM

## 2018-12-30 DIAGNOSIS — J4550 Severe persistent asthma, uncomplicated: Secondary | ICD-10-CM

## 2018-12-30 DIAGNOSIS — K219 Gastro-esophageal reflux disease without esophagitis: Secondary | ICD-10-CM

## 2018-12-30 NOTE — Patient Instructions (Addendum)
  1.  Continue to perform allergen avoidance measures -wash animals & out of bedroom, HEPA filter  2.  Continue to treat and inflammation:   A.  Symbicort 160 - 2 inhalations twice per day  B.  Flonase - 1 spray each nostril twice a day  C.  Montelukast 10 mg - 1 tablet 1 time per day  D.  Dupilumab injections every 2 weeks  4.  Continue to treat reflux:   A.  Slowly consolidate caffeine and chocolate consumption  B.  Omeprazole 40 mg - 1 tablet 1 time per day  6.  Continue oxygen 2 L nasal cannula during exertion  7.  Obtain lab sleep study for sleep apnea  8.  Continue prednisone taper with prednisone to 15 mg daily for 7days, then 10 mg daily for 7 days, then 5 mg daily for 7 days, then 5 mg every other day for 10 days, then discontinue  9.  Obtain a sinus CT scan for chronic sinusitis  10. Return to clinic in 4 weeks or earlier if problem  11.  Obtain flu vaccine (and Covid vaccine)

## 2018-12-30 NOTE — Progress Notes (Signed)
- [facility]   Follow-up Note  Referring Provider: Charlott Rakes, MD Primary Provider: Charlott Rakes, MD Date of Office Visit: 12/30/2018  Subjective:   Janet [name] (DOB: [birth year]) is a 48 y.o. female who returns to the [facility] on 12/30/2018 in re-evaluation of the following:  HPI: Janet [name] returns to this clinic in reevaluation of her severe asthma, allergic rhinitis, chronic sinusitis, suspected obesity/hypoventilation syndrome, exercise-induced hypoxemia, and LPR.  Her last visit to this clinic was 17 December 2018.  Certainly she is better from her initial evaluation but she still continues to have cough although no longer has any of her gagging and retching episodes with micturation.  She requires a short acting bronchodilator about 3 times per week.  She intermittently uses her oxygen when she exerts herself.    She can actually smell a little bit better now but still not normal.  She still very congested in her nose and still makes yellow nasal discharge.  She did finish her Augmentin.  Her reflux is under very good control at this point in time.  She continues to use her tapering steroid dose as well as a large collection of anti-inflammatory agents for her airway including the use of dupilumab injections.  She has not had her sleep test performed yet.  She apparently does not have a date to have this performed at this point.  Allergies as of 12/30/2018      Reactions   Ciprofloxacin Shortness Of Breath   Shrimp [shellfish Allergy] Other (See Comments)   positive allergy test      Medication List    albuterol (2.5 MG/3ML) 0.083% nebulizer solution Commonly known as: PROVENTIL Take 3 mLs (2.5 mg total) by nebulization every 6 (six) hours as needed for wheezing or shortness of breath.   albuterol 108 (90 Base) MCG/ACT inhaler Commonly known as: VENTOLIN HFA Inhale 1-2 puffs into the lungs  every 6 (six) hours as needed for wheezing or shortness of breath.   atorvastatin 20 MG tablet Commonly known as: LIPITOR Take 1 tablet (20 mg total) by mouth daily.   budesonide-formoterol 160-4.5 MCG/ACT inhaler Commonly known as: Symbicort Inhale 2 puffs into the lungs 2 (two) times daily.   cholecalciferol 1000 units tablet Commonly known as: VITAMIN D Take 1,000 Units by mouth at bedtime.   diazepam 10 MG tablet Commonly known as: VALIUM Take 1 tablet po half to 1 hour prior to procedure. What changed: additional instructions   DULoxetine 60 MG capsule Commonly known as: Cymbalta Take 1 capsule (60 mg total) by mouth daily.   EQ Allergy Relief (Cetirizine) 10 MG tablet Generic drug: cetirizine Take 1 tablet by mouth once daily   fluticasone 50 MCG/ACT nasal spray Commonly known as: FLONASE Place 2 sprays into both nostrils daily.   hydrOXYzine 25 MG tablet Commonly known as: ATARAX/VISTARIL Take 1 tablet (25 mg total) by mouth at bedtime as needed.   levothyroxine 150 MCG tablet Commonly known as: Euthyrox Take 1 tablet (150 mcg total) by mouth daily before breakfast.   losartan 50 MG tablet Commonly known as: COZAAR Take 1 tablet (50 mg total) by mouth daily.   metFORMIN 500 MG tablet Commonly known as: GLUCOPHAGE TAKE 2 TABLETS BY MOUTH TWICE DAILY WITH MEALS   methocarbamol 500 MG tablet Commonly known as: ROBAXIN Take 2 tablets (1,000 mg total) by mouth 2 (two) times daily.   montelukast 10 MG tablet Commonly known as: SINGULAIR Take  1 tablet (10 mg total) by mouth at bedtime.   multivitamin tablet Take 1 tablet by mouth daily.   omeprazole 40 MG capsule Commonly known as: PRILOSEC Take 1 capsule (40 mg total) by mouth daily.   OXcarbazepine 150 MG tablet Commonly known as: TRILEPTAL Take 1 tablet by mouth twice daily   predniSONE 5 MG tablet Commonly known as: DELTASONE Take three tablets (15mg ) daily for seven days. Then take two tablets  (10mg ) daily for seven days. Then take one tablet (5mg ) daily for seven days.   pregabalin 75 MG capsule Commonly known as: LYRICA Take 1 capsule by mouth twice daily   TRUEplus Pen Needles 32G X 4 MM Misc Generic drug: Insulin Pen Needle Use to inject Victoza daily.   Victoza 18 MG/3ML Sopn Generic drug: liraglutide Inject subcutaneously daily 0.6 mg for 1 week then 1.2 mg for 1 week then 1.8 mg thereafter       Past Medical History:  Diagnosis Date  . Anemia   . Angio-edema   . Anxiety   . Asthma   . Depression   . Hypothyroidism   . Pre-diabetes   . Right foot drop   . Sleep apnea   . Thyroid disease   . Urticaria   . Uterine cancer [facility])     Past Surgical History:  Procedure Laterality Date  . ABDOMINAL HYSTERECTOMY    . CARPAL TUNNEL RELEASE    . CESAREAN SECTION    . CHOLECYSTECTOMY    . cyst removal     uterus  . DILATION AND CURETTAGE OF UTERUS     still birth  . DILATION AND CURETTAGE OF UTERUS N/A 06/11/2016   Procedure: DILATATION AND CURETTAGE;  Surgeon: Emily Filbert, MD;  Location: [facility];  Service: Gynecology;  Laterality: N/A;  . ROBOTIC ASSISTED TOTAL HYSTERECTOMY WITH BILATERAL SALPINGO OOPHERECTOMY Bilateral 07/09/2016   Procedure: XI ROBOTIC ASSISTED TOTAL HYSTERECTOMY WITH BILATERAL SALPINGO OOPHORECTOMY FOR UTERUS GREATER THAN 250 GRAMS, LYSIS OF ADHESIONS;  Surgeon: Everitt Amber, MD;  Location: [facility];  Service: Gynecology;  Laterality: Bilateral;  . SINOSCOPY    . SINUS EXPLORATION    . WISDOM TOOTH EXTRACTION      Review of systems negative except as noted in HPI / PMHx or noted below:  Review of Systems  Constitutional: Negative.   HENT: Negative.   Eyes: Negative.   Respiratory: Negative.   Cardiovascular: Negative.   Gastrointestinal: Negative.   Genitourinary: Negative.   Musculoskeletal: Negative.   Skin: Negative.   Neurological: Negative.   Endo/Heme/Allergies: Negative.   Psychiatric/Behavioral: Negative.      Objective:    Vitals:   12/30/18 1126  BP: (!) 150/90  Pulse: 84  Resp: 20  Temp: 98.1 F (36.7 C)  SpO2: 95%          Physical Exam Constitutional:      Appearance: She is not diaphoretic.  HENT:     Head: Normocephalic.     Right Ear: Tympanic membrane, ear canal and external ear normal.     Left Ear: Tympanic membrane, ear canal and external ear normal.     Nose: Nose normal. No mucosal edema or rhinorrhea.     Mouth/Throat:     Pharynx: Uvula midline. No oropharyngeal exudate.  Eyes:     Conjunctiva/sclera: Conjunctivae normal.  Neck:     Thyroid: No thyromegaly.     Trachea: Trachea normal. No tracheal tenderness or tracheal deviation.  Cardiovascular:     Rate and  Rhythm: Normal rate and regular rhythm.     Heart sounds: Normal heart sounds, S1 normal and S2 normal. No murmur.  Pulmonary:     Effort: No respiratory distress.     Breath sounds: Normal breath sounds. No stridor. No wheezing (Bilateral expiratory wheezes all lung fields) or rales.  Lymphadenopathy:     Head:     Right side of head: No tonsillar adenopathy.     Left side of head: No tonsillar adenopathy.     Cervical: No cervical adenopathy.  Skin:    Findings: No erythema or rash.     Nails: There is no clubbing.   Neurological:     Mental Status: She is alert.     Diagnostics:    Spirometry was performed and demonstrated an FEV1 of 2.88 at 93 % of predicted.    Partial results of a PET scan obtained 04 December 2016 identified the following:  Mucosal thickening of bilateral maxillary sinuses and ethmoid air cells. Clear mastoid air cells.  Assessment and Plan:   1. Not well controlled severe persistent asthma   2. Other allergic rhinitis   3. Chronic pansinusitis   4. Obstructive sleep apnea syndrome   5. Obesity hypoventilation syndrome ([place])   6. LPRD (laryngopharyngeal reflux disease)   7. Exercise hypoxemia     1.  Continue to perform allergen avoidance measures -wash animals & out of  bedroom, HEPA filter  2.  Continue to treat and inflammation:   A.  Symbicort 160 - 2 inhalations twice per day  B.  Flonase - 1 spray each nostril twice a day  C.  Montelukast 10 mg - 1 tablet 1 time per day  D.  Dupilumab injections every 2 weeks  4.  Continue to treat reflux:   A.  Slowly consolidate caffeine and chocolate consumption  B.  Omeprazole 40 mg - 1 tablet 1 time per day  6.  Continue oxygen 2 L nasal cannula during exertion  7.  Obtain lab sleep study for sleep apnea  8.  Continue prednisone taper with prednisone to 15 mg daily for 7days, then 10 mg daily for 7 days, then 5 mg daily for 7 days, then 5 mg every other day for 10 days, then discontinue  9.  Obtain a sinus CT scan for chronic sinusitis  10. Return to clinic in 4 weeks or earlier if problem  11.  Obtain flu vaccine (and Covid vaccine)  Beza is better but she still has lots inflammation and is probably chronically infected in her upper airway and there is still the issue of sleep apnea and obesity hypoventilation syndrome that we need to work through.  Will obtain a CT scan of her sinuses and arrange for her to get her sleep study while she continues on a very large collection of anti-inflammatory agents for airway and therapy directed against reflux as noted above.  I will see her back in this clinic in 4 weeks or earlier if there is a problem.  Allena Katz, MD Allergy / Immunology [place]

## 2018-12-31 ENCOUNTER — Encounter: Payer: Self-pay | Admitting: Gynecologic Oncology

## 2018-12-31 ENCOUNTER — Inpatient Hospital Stay: Payer: Medicaid Other | Attending: Gynecologic Oncology | Admitting: Gynecologic Oncology

## 2018-12-31 ENCOUNTER — Encounter: Payer: Self-pay | Admitting: Allergy and Immunology

## 2018-12-31 DIAGNOSIS — Z90710 Acquired absence of both cervix and uterus: Secondary | ICD-10-CM | POA: Diagnosis not present

## 2018-12-31 DIAGNOSIS — C541 Malignant neoplasm of endometrium: Secondary | ICD-10-CM | POA: Diagnosis not present

## 2018-12-31 DIAGNOSIS — R590 Localized enlarged lymph nodes: Secondary | ICD-10-CM

## 2018-12-31 DIAGNOSIS — Z90722 Acquired absence of ovaries, bilateral: Secondary | ICD-10-CM

## 2018-12-31 NOTE — Progress Notes (Signed)
Gynecologic Oncology Telehealth Follow-up Note: Gyn-Onc  I connected with Janet [name] on 12/31/18 at  3:45 PM EST by telephone and verified that I am speaking with the correct person using two identifiers.  I discussed the limitations, risks, security and privacy concerns of performing an evaluation and management service by telemedicine and the availability of in-person appointments. I also discussed with the patient that there may be a patient responsible charge related to this service. The patient expressed understanding and agreed to proceed.  Other persons participating in the visit and their role in the encounter: none.  Patient's location: home Provider's location: [place]  Chief Complaint:  Chief Complaint  Patient presents with  . endometrial cancer    Assessment/Plan:  [name] is a 48 y.o.  with stage IA grade 2 endometrial cancer s/p robotic hysterectomy, BSO on 99991111 complicated by post op wound separation.  Endometrial cancer: Pathology revealed low risk factors for recurrence, therefore no adjuvant therapy is recommended according to NCCN guidelines.  I discussed risk for recurrence and typical symptoms encouraged her to notify Janet [name] of these should they develop between visits.  I recommend she have follow-up every 6 months until May, 2023 in accordance with NCCN guidelines. Those visits should include symptom assessment, physical exam and pelvic examination. Pap smears are not indicated or recommended in the routine surveillance of endometrial cancer.  Enlarged aortocaval nodes - repeat CT imaging in November, 2021 to re-evaluate for progression. Stable to date.  Will need creatinine assessed closer to date.   Anal pain much better with treatment of fissure by Dr [name], we appreciate her help with this.     HPI: [name] is a 48 y.o.  G2P1 with history of irregular menses.  Janet [name] presented To Dr. [name] in  with a  report of bleeding daily for almost a month. [name] was unable to tolerate an office examination and thus she underwent hysteroscopy D&C on 06/11/2016.  Pathology was notable for complex atypical hyperplasia with a few small foci suspicious for early/microscopic adenocarcinoma. She presented to the emergency room on May 6 and was diagnosed with endometritis. She's currently receiving course of Flagyl which will be completed on 06/23/2016.  There is no family history of gynecologic GI or breast malignancies.    On 07/09/16 she underwent robotic assisted total hysterectomy, BSO for a uterus >250 (336)gm with minilaparotomy for specimen delivery.  Final pathology revealed a 2.4cm grade 2 endometrioid tumor with no myometrial invasion or LVSI. Lymphadenectomy was not performed due to extreme abdominal adiposity (BMI 56) and preoperative preinvasive diagnosis. She was determined to have low risk features in her pathology and therefore no adjuvant therapy was recommended in accordance with NCCN guidelines.  Postop week 1 her minilaparotomy wound opened (no cellulitis) and closed via secondary intention.  On post-op imaging (CT) on 11/21/16 (due to inability to stage because of obesity) she had findings of Interval increase in size of a now mildly enlarged retroperitoneal lymph node in the aortocaval space. Close attention in this region on follow-up recommended as metastatic disease is a concern. 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  PET/CT was ordered and performed on 12/04/16 which showed aortocaval adenopathy measuring 1.5cm. Non-PET avid. The images were reviewed at tumor board and felt to not be likely metastatic disease therefore observation and repeat imaging was recommended.  Repeat CT scan on 04/10/17 showed stabel PA nodes (1.6cm largest).  She developed new onset left lower extremity edema  in May, 2019. Dopplers negative.  Surveillance CT abd/pelvis in November, 2019 showed nonspecific mild retroperitoneal adenopathy which is stable, the continued stability of these nodes along with the absence of hypermetabolism on PET in 2018 suggest reactive adenopathy, although continued CT surveillance is warranted.  No new potential findings of metastatic disease in the abdomen or pelvis.  Hepatic cirrhosis was seen.  Stable moderate splenomegaly and lower paraesophageal periumbilical and perisplenic varices was seen.  Interval Hx:   She has had some stable back pain and sciatica. No bleeding or symptoms concerning for recurrent disease.   Scheduled CT scan was performed on December 24, 2018 and this revealed stable prominent retroperitoneal lymph nodes in the periaortic region measuring up to 1.5 cm, unchanged compared to prior examinations.  There are multiple large varices in the vicinity which do not represent represent lymphadenopathy.  There was stigmata of sudden cirrhosis and splenomegaly present.  There was no evidence of recurrence or metastatic disease.  Review of Systems:  Constitutional  Feels well,   Cardiovascular  No chest pain, shortness of breath, or edema  Pulmonary  No cough or wheeze.  Gastro Intestinal  No nausea, vomitting, or diarrhoea. No bright red blood per rectum, no abdominal pain, no change in bowel movement,  Genito Urinary  No frequency, urgency, dysuria, vaginal spotting Musculo Skeletal  No myalgia, arthralgia, joint swelling or pain  Neurologic  No weakness, numbness, change in gait,  Psychology  No depression, anxiety from diagnoses and social situation  Current Meds:  Outpatient Encounter Medications as of 12/31/2018  Medication Sig  . albuterol (PROVENTIL) (2.5 MG/3ML) 0.083% nebulizer solution Take 3 mLs (2.5 mg total) by nebulization every 6 (six) hours as needed for wheezing or shortness of breath.  Marland Kitchen albuterol (VENTOLIN HFA) 108 (90 Base)  MCG/ACT inhaler Inhale 1-2 puffs into the lungs every 6 (six) hours as needed for wheezing or shortness of breath.  Marland Kitchen atorvastatin (LIPITOR) 20 MG tablet Take 1 tablet (20 mg total) by mouth daily.  . budesonide-formoterol (SYMBICORT) 160-4.5 MCG/ACT inhaler Inhale 2 puffs into the lungs 2 (two) times daily.  . cholecalciferol (VITAMIN D) 1000 units tablet Take 1,000 Units by mouth at bedtime.  . diazepam (VALIUM) 10 MG tablet Take 1 tablet po half to 1 hour prior to procedure. (Patient taking differently: Take 1 tablet po half to 1 hour prior to procedure. When needed)  . DULoxetine (CYMBALTA) 60 MG capsule Take 1 capsule (60 mg total) by mouth daily.  Noelle Penner ALLERGY RELIEF, CETIRIZINE, 10 MG tablet Take 1 tablet by mouth once daily  . fluticasone (FLONASE) 50 MCG/ACT nasal spray Place 2 sprays into both nostrils daily.  . hydrOXYzine (ATARAX/VISTARIL) 25 MG tablet Take 1 tablet (25 mg total) by mouth at bedtime as needed.  . Insulin Pen Needle (TRUEPLUS PEN NEEDLES) 32G X 4 MM MISC Use to inject Victoza daily.  Marland Kitchen levothyroxine (SYNTHROID) 150 MCG tablet Take 1 tablet (150 mcg total) by mouth daily before breakfast.  . liraglutide (VICTOZA) 18 MG/3ML SOPN Inject subcutaneously daily 0.6 mg for 1 week then 1.2 mg for 1 week then 1.8 mg thereafter  . losartan (COZAAR) 50 MG tablet Take 1 tablet (50 mg total) by mouth daily.  . metFORMIN (GLUCOPHAGE) 500 MG tablet TAKE 2 TABLETS BY MOUTH TWICE DAILY WITH MEALS  . methocarbamol (ROBAXIN) 500 MG tablet Take 2 tablets (1,000 mg total) by mouth 2 (two) times daily.  . montelukast (  SINGULAIR) 10 MG tablet Take 1 tablet (10 mg total) by mouth at bedtime.  . Multiple Vitamin (MULTIVITAMIN) tablet Take 1 tablet by mouth daily.  Marland Kitchen omeprazole (PRILOSEC) 40 MG capsule Take 1 capsule (40 mg total) by mouth daily.  . OXcarbazepine (TRILEPTAL) 150 MG tablet Take 1 tablet by mouth twice daily  . predniSONE (DELTASONE) 5 MG tablet Take three tablets (15mg ) daily for  seven days. Then take two tablets (10mg ) daily for seven days. Then take one tablet (5mg ) daily for seven days.  . pregabalin (LYRICA) 75 MG capsule Take 1 capsule by mouth twice daily   No facility-administered encounter medications on file as of 12/31/2018.     Allergy:  Allergies  Allergen Reactions  . Ciprofloxacin Shortness Of Breath  . Shrimp [Shellfish Allergy] Other (See Comments)    positive allergy test    Social Hx:   Social History   Socioeconomic History  . Marital status: Widowed    Spouse name: Not on file  . Number of children: 1  . Years of education: college  . Highest education level: Not on file  Occupational History  . Occupation: Unemployed  Social Needs  . Financial resource strain: Not on file  . Food insecurity    Worry: Not on file    Inability: Not on file  . Transportation needs    Medical: Not on file    Non-medical: Not on file  Tobacco Use  . Smoking status: Never Smoker  . Smokeless tobacco: Never Used  Substance and Sexual Activity  . Alcohol use: No  . Drug use: No  . Sexual activity: Not Currently    Birth control/protection: None  Lifestyle  . Physical activity    Days per week: Not on file    Minutes per session: Not on file  . Stress: Not on file  Relationships  . Social Herbalist on phone: Not on file    Gets together: Not on file    Attends religious service: Not on file    Active member of club or organization: Not on file    Attends meetings of clubs or organizations: Not on file    Relationship status: Not on file  . Intimate partner violence    Fear of current or ex partner: Not on file    Emotionally abused: Not on file    Physically abused: Not on file    Forced sexual activity: Not on file  Other Topics Concern  . Not on file  Social History Narrative   Lives at home with her son.   Right-handed.   2 cups caffeine per day.   Husband was diagnosed with leukemia in January 2018 with a life  expectancy of approximately 6 months. At this time he is receiving supportive care and regularly is managed with blood and platelet transfusions.  Regular home nursing visits are in place.   Marguritte Florentino denies history of sexual or physical abuse.  Past Surgical Hx:  Past Surgical History:  Procedure Laterality Date  . ABDOMINAL HYSTERECTOMY    . CARPAL TUNNEL RELEASE    . CESAREAN SECTION    . CHOLECYSTECTOMY    . cyst removal     uterus  . DILATION AND CURETTAGE OF UTERUS     still birth  . DILATION AND CURETTAGE OF UTERUS N/A 06/11/2016   Procedure: DILATATION AND CURETTAGE;  Surgeon: Emily Filbert, MD;  Location: [facility];  Service: Gynecology;  Laterality: N/A;  .  ROBOTIC ASSISTED TOTAL HYSTERECTOMY WITH BILATERAL SALPINGO OOPHERECTOMY Bilateral 07/09/2016   Procedure: XI ROBOTIC ASSISTED TOTAL HYSTERECTOMY WITH BILATERAL SALPINGO OOPHORECTOMY FOR UTERUS GREATER THAN 250 GRAMS, LYSIS OF ADHESIONS;  Surgeon: Everitt Amber, MD;  Location: [facility];  Service: Gynecology;  Laterality: Bilateral;  . SINOSCOPY    . SINUS EXPLORATION    . WISDOM TOOTH EXTRACTION      Past Medical Hx:  Past Medical History:  Diagnosis Date  . Anemia   . Angio-edema   . Anxiety   . Asthma   . Depression   . Hypothyroidism   . Pre-diabetes   . Right foot drop   . Sleep apnea   . Thyroid disease   . Urticaria   . Uterine cancer [facility])     Past Gynecological History:  Gravida 2 para 1 menarche at 6 with irregular menses since last Pap 06/11/2016 within normal limits no history of abnormal Pap test reports history of OCP use in her 84s  Family Hx:  Family History  Problem Relation Age of Onset  . Heart attack Mother   . Allergic rhinitis Mother   . Suicidality Father     Vitals:  There were no vitals taken for this visit. There is no height or weight on file to calculate BMI. Wt Readings from Last 3 Encounters:  12/02/18 (!) 383 lb (173.7 kg)  11/13/18 (!) 383 lb 6.4 oz (173.9 kg)  11/09/18  (!) 381 lb (172.8 kg)    Physical Exam: Deferred  I discussed the assessment and treatment plan with the patient. The patient was provided with an opportunity to ask questions and all were answered. The patient agreed with the plan and demonstrated an understanding of the instructions.   The patient was advised to call back or see an in-person evaluation if the symptoms worsen or if the condition fails to improve as anticipated.   I provided 10 minutes of non face-to-face telephone visit time during this encounter, and > 50% was spent counseling as documented under my assessment & plan.   Thereasa Solo, MD 12/31/2018, 3:54 PM

## 2019-01-01 ENCOUNTER — Telehealth: Payer: Self-pay | Admitting: *Deleted

## 2019-01-01 ENCOUNTER — Telehealth: Payer: Self-pay

## 2019-01-01 NOTE — Telephone Encounter (Signed)
Patient's sinus CT is in review on [facility]. It is currently in the Physician Review Process. Will check back on Monday.

## 2019-01-01 NOTE — Telephone Encounter (Signed)
Called and scheduled the patient for a follow up in May with Dr. Denman George

## 2019-01-04 ENCOUNTER — Other Ambulatory Visit: Payer: Self-pay | Admitting: Neurology

## 2019-01-04 DIAGNOSIS — E1149 Type 2 diabetes mellitus with other diabetic neurological complication: Secondary | ICD-10-CM

## 2019-01-04 NOTE — Telephone Encounter (Signed)
Still in process

## 2019-01-05 ENCOUNTER — Encounter (HOSPITAL_BASED_OUTPATIENT_CLINIC_OR_DEPARTMENT_OTHER): Payer: Medicaid Other | Admitting: Internal Medicine

## 2019-01-05 NOTE — Progress Notes (Signed)
PATIENT: Janet [name] DOB: 05/27/1970  REASON FOR VISIT: follow up HISTORY FROM: patient  HISTORY OF PRESENT ILLNESS: Today 01/06/19  HISTORY Janet [name], is a 48 year old female, seen in refer by her primary care doctor  Janet [name], for evaluation of right foot drop, initial evaluation was on November twelfth 2018.  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  She noticed that the top of right toe went numb since April 2018, she described right toe numbness tingling, radiating paresthesia to the top of right foot, right lateral leg, also complains of low back pain, radiating pain to her right leg, getting worse after standing up since July 2018, she denies bowel and bladder incontinence, no left leg involvement,  I personally reviewed x-ray in October 2018, degenerative changes of lumbar spine with scoliosis concave to the left, no acute abnormality,  Laboratory evaluation in October 2018, glucose was 105, creatinine 0.7, lipid profile, cholesterol 131, LDL 50, normal CMP with creatinine of 0.69, decreased TSH 0.17, A1c 6.2, uric acid 4.7, CPK 78  Update April 17, 2017: MRI of lumbar in December 2018 showed mild degenerative changes, no significant stenosis, or  nerve root compression  MRI of right thigh showed no significant abnormality, no evidence of nerve damage.  PET scan October 2018 no evidence of hypermetabolic recurrent, metastatic disease, retroperitoneal adenopathy persistent, remains suspicious based on interval progression since May 2018  CT abdomen in February 2019, similar appearing aortocaval lymph nodes when compared to recent scan, metastatic disease to this location is not excluded, recommended follow-up scan, Hepatic cirrhosis, portal vein hypertension with splenomegaly,upper abdominal collateral vessels.  EMG nerve conduction study noticed significant active denervation at right tibialis anterior, peroneal longus, mild involvement of right biceps femoris short head.  With well preserved bilateral sural, and superficial peroneal sensory potentials, which raised the possibility of right sciatic neuropathy versus right lumbosacral radiculopathy.  She reported 25% improved, right foot is getting stronger, she still has low back pain, peritoneal pain.  UPDATE Sept 11 2019: She is still have significant right distal leg weakness, wear right ankle brace for long distance, taking Trileptal 150 mg twice a day, Cymbalta 60 mg twice a day, run out of Lyrica 75 twice a day, realize it has helped her right lower extremity deep achy pain,  she will have follow-up appointment with her oncologist Dr. Denman [name] in November, and planning will have a repeat CT abdomen in November 2019,   She is planning on to have bariatric surgery for weight loss in October 2019  Update January 06, 2019 SS: She continues to complain of numbness to the right foot, but the medications help to manage symptoms.  She remains on Trileptal 150 mg twice a day, Cymbalta 60 mg twice a day, Lyrica 75 mg twice a day.  She notices if she misses a dose of the medicine, she has significant numbness, achy pain  to her right lower extremity.  She has not been consistent to wear her  AFO brace for her right foot drop.  She suffered a fall on Monday, while bringing the trash can in.  She is sore from the fall.  The fall served as a reminder to her to wear her brace.  She continues to follow with oncology for history of uterine cancer, thus far has done well.  She has chronic low platelets.  She is receiving injections in her back for chronic back pain.  She was approved for disability this year.  She lives at home with her 55 year old child, and drives a car.  She is seeing an allergy doctor for chronic respiratory issues, is now on prednisone.  She was planning to have bariatric surgery, but this got delayed due to the COVID-19 pandemic.  She presents today for follow-up unaccompanied.  REVIEW OF SYSTEMS: Out of a complete 14 system review of symptoms, the patient complains only of the following symptoms, and all other reviewed systems are negative.  Headache, numbness  ALLERGIES: Allergies  Allergen Reactions  . Ciprofloxacin Shortness Of Breath  . Shrimp [Shellfish Allergy] Other (See Comments)    positive allergy test    HOME MEDICATIONS: Outpatient Medications Prior to Visit  Medication Sig Dispense Refill  . albuterol (PROVENTIL) (2.5 MG/3ML) 0.083% nebulizer solution Take 3 mLs (2.5 mg total) by nebulization every 6 (six) hours as needed for wheezing or shortness of breath. 75 mL 3  . albuterol (VENTOLIN HFA) 108 (90 Base) MCG/ACT inhaler Inhale 1-2 puffs into the lungs every 6 (six) hours as needed for wheezing or shortness of breath. 54 g 3  . atorvastatin (LIPITOR) 20 MG tablet Take 1 tablet (20 mg total) by mouth daily. 30 tablet 3  . budesonide-formoterol (SYMBICORT) 160-4.5 MCG/ACT inhaler Inhale 2 puffs into the lungs 2 (two) times daily. 1 Inhaler 5  . cholecalciferol (VITAMIN D) 1000 units tablet Take 1,000 Units by mouth at bedtime.    . DULoxetine (CYMBALTA) 60 MG capsule Take 1 capsule (60 mg total) by mouth daily. 90 capsule 4  . EQ ALLERGY RELIEF,  CETIRIZINE, 10 MG tablet Take 1 tablet by mouth once daily 90 tablet 0  . fluticasone (FLONASE) 50 MCG/ACT nasal spray Place 2 sprays into both nostrils daily. 16 g 6  . Insulin Pen Needle (TRUEPLUS PEN NEEDLES) 32G X 4 MM MISC Use to inject Victoza daily. 100 each 11  . levothyroxine (SYNTHROID) 150 MCG tablet Take 1 tablet (150 mcg total) by mouth daily before breakfast. 90 tablet 0  . liraglutide (VICTOZA) 18 MG/3ML SOPN Inject subcutaneously daily 0.6 mg for 1 week then 1.2 mg for 1 week then 1.8 mg thereafter 30 mL 3  . losartan (COZAAR) 50 MG tablet Take 1 tablet (50 mg total) by mouth daily. 30 tablet 1  . metFORMIN (GLUCOPHAGE) 500 MG tablet TAKE 2 TABLETS BY MOUTH TWICE DAILY WITH MEALS 120 tablet 0  . methocarbamol (ROBAXIN) 500 MG tablet Take 2 tablets (1,000 mg total) by mouth 2 (two) times daily. 120 tablet 1  . montelukast (SINGULAIR) 10 MG tablet Take 1 tablet (10 mg total) by mouth at bedtime. 90 tablet 1  . Multiple Vitamin (MULTIVITAMIN) tablet Take 1 tablet by mouth daily.    Marland Kitchen omeprazole (PRILOSEC) 40 MG capsule Take 1 capsule (40 mg total) by mouth daily. 30 capsule 5  . OXcarbazepine (TRILEPTAL) 150 MG tablet Take 1 tablet by mouth twice daily 180 tablet 0  .  predniSONE (DELTASONE) 5 MG tablet Take three tablets (15mg ) daily for seven days. Then take two tablets (10mg ) daily for seven days. Then take one tablet (5mg ) daily for seven days. 42 tablet 0  . pregabalin (LYRICA) 75 MG capsule Take 1 capsule by mouth twice daily 60 capsule 0  . diazepam (VALIUM) 10 MG tablet Take 1 tablet po half to 1 hour prior to procedure. (Patient taking differently: Take 1 tablet po half to 1 hour prior to procedure. When needed) 1 tablet 0  . hydrOXYzine (ATARAX/VISTARIL) 25 MG tablet Take 1 tablet (25 mg total) by mouth at bedtime as needed. 30 tablet 1   No facility-administered medications prior to visit.     PAST MEDICAL HISTORY: Past Medical History:  Diagnosis Date  . Anemia   .  Angio-edema   . Anxiety   . Asthma   . Depression   . Hypothyroidism   . Pre-diabetes   . Right foot drop   . Sleep apnea   . Thyroid disease   . Urticaria   . Uterine cancer ([place])     PAST SURGICAL HISTORY: Past Surgical History:  Procedure Laterality Date  . ABDOMINAL HYSTERECTOMY    . CARPAL TUNNEL RELEASE    . CESAREAN SECTION    . CHOLECYSTECTOMY    . cyst removal     uterus  . DILATION AND CURETTAGE OF UTERUS     still birth  . DILATION AND CURETTAGE OF UTERUS N/A 06/11/2016   Procedure: DILATATION AND CURETTAGE;  Surgeon: Emily Filbert, MD;  Location: [facility];  Service: Gynecology;  Laterality: N/A;  . ROBOTIC ASSISTED TOTAL HYSTERECTOMY WITH BILATERAL SALPINGO OOPHERECTOMY Bilateral 07/09/2016   Procedure: XI ROBOTIC ASSISTED TOTAL HYSTERECTOMY WITH BILATERAL SALPINGO OOPHORECTOMY FOR UTERUS GREATER THAN 250 GRAMS, LYSIS OF ADHESIONS;  Surgeon: Everitt Amber, MD;  Location: [facility];  Service: Gynecology;  Laterality: Bilateral;  . SINOSCOPY    . SINUS EXPLORATION    . WISDOM TOOTH EXTRACTION      FAMILY HISTORY: Family History  Problem Relation Age of Onset  . Heart attack Mother   . Allergic rhinitis Mother   . Suicidality Father     SOCIAL HISTORY: Social History   Socioeconomic History  . Marital status: Widowed    Spouse name: Not on file  . Number of children: 1  . Years of education: college  . Highest education level: Not on file  Occupational History  . Occupation: Unemployed  Social Needs  . Financial resource strain: Not on file  . Food insecurity    Worry: Not on file    Inability: Not on file  . Transportation needs    Medical: Not on file    Non-medical: Not on file  Tobacco Use  . Smoking status: Never Smoker  . Smokeless tobacco: Never Used  Substance and Sexual Activity  . Alcohol use: No  . Drug use: No  . Sexual activity: Not Currently    Birth control/protection: None  Lifestyle  . Physical activity    Days per week: Not on file     Minutes per session: Not on file  . Stress: Not on file  Relationships  . Social Herbalist on phone: Not on file    Gets together: Not on file    Attends religious service: Not on file    Active member of club or organization: Not on file    Attends meetings of clubs or organizations: Not on file  Relationship status: Not on file  . Intimate partner violence    Fear of current or ex partner: Not on file    Emotionally abused: Not on file    Physically abused: Not on file    Forced sexual activity: Not on file  Other Topics Concern  . Not on file  Social History Narrative   Lives at home with her son.   Right-handed.   2 cups caffeine per day.    PHYSICAL EXAM  Vitals:   01/06/19 0843  BP: (!) 146/75  Pulse: 83  Temp: 97.8 F (36.6 C)  TempSrc: Oral  Weight: (!) 381 lb 3.2 oz (172.9 kg)  Height: 5' 6.5" (1.689 m)   Body mass index is 60.61 kg/m.  Generalized: Well developed, in no acute distress, obese  Neurological examination  Mentation: Alert oriented to time, place, history taking. Follows all commands speech and language fluent Cranial nerve II-XII: Pupils were equal round reactive to light. Extraocular movements were full, visual field were full on confrontational test. Facial sensation and strength were normal.  Head turning and shoulder shrug  were normal and symmetric. Motor: The motor testing reveals 5 over 5 strength of all 4 extremities. Good symmetric motor tone is noted throughout, weak right ankle dorsiflexion Sensory: Decreased soft touch to right lower extremity up to mid shin Coordination: Cerebellar testing reveals good finger-nose-finger and heel-to-shin bilaterally.  Gait and station: Able to rise from seated position without pushoff, right foot drop, uses a cane for ambulation, gait is cautious Reflexes: Deep tendon reflexes are symmetric and normal bilaterally.   DIAGNOSTIC DATA (LABS, IMAGING, TESTING) - I reviewed patient  records, labs, notes, testing and imaging myself where available.  Lab Results  Component Value Date   WBC 5.7 12/08/2018   HGB 15.0 12/08/2018   HCT 43.4 12/08/2018   MCV 90 12/08/2018   PLT 84 (LL) 12/08/2018      Component Value Date/Time   NA 143 11/09/2018 1016   K 3.8 11/09/2018 1016   CL 104 11/09/2018 1016   CO2 23 11/09/2018 1016   GLUCOSE 131 (H) 11/09/2018 1016   GLUCOSE 127 (H) 12/16/2017 0841   BUN 6 11/09/2018 1016   CREATININE 0.73 11/09/2018 1016   CREATININE 0.74 12/16/2017 0841   CREATININE 0.69 12/26/2015 0929   CALCIUM 9.8 11/09/2018 1016   PROT 6.6 11/09/2018 1016   ALBUMIN 4.2 11/09/2018 1016   AST 41 (H) 11/09/2018 1016   ALT 25 11/09/2018 1016   ALKPHOS 131 (H) 11/09/2018 1016   BILITOT 1.6 (H) 11/09/2018 1016   GFRNONAA 96 11/09/2018 1016   GFRNONAA >60 12/16/2017 0841   GFRNONAA >89 12/26/2015 0929   GFRAA 111 11/09/2018 1016   GFRAA >60 12/16/2017 0841   GFRAA >89 12/26/2015 0929   Lab Results  Component Value Date   CHOL 147 11/09/2018   HDL 71 11/09/2018   LDLCALC 59 11/09/2018   TRIG 93 11/09/2018   CHOLHDL 2.1 11/09/2018   Lab Results  Component Value Date   HGBA1C 6.7 11/09/2018   No results found for: [id number] Lab Results  Component Value Date   TSH 0.027 (L) 11/09/2018   ASSESSMENT AND PLAN 48 y.o. year old female  has a past medical history of Anemia, Angio-edema, Anxiety, Asthma, Depression, Hypothyroidism, Pre-diabetes, Right foot drop, Sleep apnea, Thyroid disease, Urticaria, and Uterine cancer ([place]). here with:  1.  Right foot drop, sensory loss of right superficial peroneal and deep peroneal distribution -Right common peroneal neuropathy -MRI  of the lumbar spine showed no significant abnormality -MRI of the right thigh showed no significant abnormality -Continue Trileptal 150 mg twice a day -Continue Cymbalta 60 mg daily -Continue Lyrica 75 mg twice a day -She has history of uterine cancer, remains under close  supervision of her oncologist, so far has been doing well -Recent laboratory evaluation, has shown chronic low platelet level, normal sodium 143 -Resume use of AFO brace, for gait safety, fall prevention -She will follow-up in 1 year or sooner if needed  I spent 15 minutes with the patient. 50% of this time was spent discussing her plan of care.   Butler Denmark, AGNP-C, DNP 01/06/2019, 8:53 AM [facility] [address] [place], [place] [address] [phone]

## 2019-01-06 ENCOUNTER — Other Ambulatory Visit: Payer: Self-pay

## 2019-01-06 ENCOUNTER — Ambulatory Visit: Payer: Medicaid Other | Admitting: Neurology

## 2019-01-06 ENCOUNTER — Encounter: Payer: Self-pay | Admitting: Neurology

## 2019-01-06 VITALS — BP 146/75 | HR 83 | Temp 97.8°F | Ht 66.5 in | Wt 381.2 lb

## 2019-01-06 DIAGNOSIS — F330 Major depressive disorder, recurrent, mild: Secondary | ICD-10-CM

## 2019-01-06 DIAGNOSIS — G5701 Lesion of sciatic nerve, right lower limb: Secondary | ICD-10-CM

## 2019-01-06 DIAGNOSIS — E1149 Type 2 diabetes mellitus with other diabetic neurological complication: Secondary | ICD-10-CM

## 2019-01-06 MED ORDER — OXCARBAZEPINE 150 MG PO TABS: 150.0000 mg | ORAL_TABLET | Freq: Two times a day (BID) | ORAL | 1 refills | Status: DC

## 2019-01-06 MED ORDER — PREGABALIN 75 MG PO CAPS: 75.0000 mg | ORAL_CAPSULE | Freq: Two times a day (BID) | ORAL | 5 refills | Status: DC

## 2019-01-06 MED ORDER — DULOXETINE HCL 60 MG PO CPEP: 60.0000 mg | ORAL_CAPSULE | Freq: Every day | ORAL | 4 refills | Status: DC

## 2019-01-06 NOTE — Patient Instructions (Signed)
We will continue current medications. I will refill your medications. Be very careful walking, consider using your AFO brace for fall safety.   Return in 1 year.

## 2019-01-06 NOTE — Telephone Encounter (Signed)
CT was approved. Will contact [facility] to schedule CT and inform patient.

## 2019-01-13 ENCOUNTER — Ambulatory Visit: Payer: Medicaid Other | Admitting: Family Medicine

## 2019-01-13 ENCOUNTER — Other Ambulatory Visit: Payer: Self-pay | Admitting: *Deleted

## 2019-01-13 ENCOUNTER — Encounter: Payer: Self-pay | Admitting: *Deleted

## 2019-01-13 DIAGNOSIS — J329 Chronic sinusitis, unspecified: Secondary | ICD-10-CM

## 2019-01-13 MED ORDER — CLINDAMYCIN HCL 150 MG PO CAPS: ORAL_CAPSULE | ORAL | 0 refills | Status: DC

## 2019-01-13 NOTE — Telephone Encounter (Signed)
Informed Shenequia of her Sinus CT Scan Results. Per Dr. Neldon Mc - she has a chronic sinus injection and will require a long term antibiotic and an ENT referral. RX sent for Clindamycin 150 three times daily for 21 days. Referral faxed to [facility].

## 2019-01-19 ENCOUNTER — Encounter: Payer: Medicaid Other | Admitting: Physical Medicine & Rehabilitation

## 2019-01-19 ENCOUNTER — Other Ambulatory Visit (HOSPITAL_COMMUNITY)
Admission: RE | Admit: 2019-01-19 | Discharge: 2019-01-19 | Disposition: A | Discharge status: Home / Self Care | Payer: Medicaid Other | Source: Ambulatory Visit | Attending: Internal Medicine | Admitting: Internal Medicine

## 2019-01-19 DIAGNOSIS — Z01812 Encounter for preprocedural laboratory examination: Secondary | ICD-10-CM | POA: Diagnosis present

## 2019-01-19 DIAGNOSIS — Z20828 Contact with and (suspected) exposure to other viral communicable diseases: Secondary | ICD-10-CM | POA: Insufficient documentation

## 2019-01-19 LAB — SARS CORONAVIRUS 2 (TAT 6-24 HRS): SARS Coronavirus 2: NEGATIVE

## 2019-01-20 ENCOUNTER — Other Ambulatory Visit: Payer: Self-pay

## 2019-01-20 ENCOUNTER — Ambulatory Visit (HOSPITAL_BASED_OUTPATIENT_CLINIC_OR_DEPARTMENT_OTHER): Payer: Medicaid Other | Attending: Allergy and Immunology | Admitting: Internal Medicine

## 2019-01-20 DIAGNOSIS — G4733 Obstructive sleep apnea (adult) (pediatric): Secondary | ICD-10-CM | POA: Insufficient documentation

## 2019-01-20 DIAGNOSIS — I493 Ventricular premature depolarization: Secondary | ICD-10-CM | POA: Insufficient documentation

## 2019-01-21 ENCOUNTER — Encounter: Payer: Self-pay | Admitting: Family Medicine

## 2019-01-24 DIAGNOSIS — G4733 Obstructive sleep apnea (adult) (pediatric): Secondary | ICD-10-CM | POA: Diagnosis not present

## 2019-01-24 NOTE — Procedures (Signed)
   Patient Name: Janet [name], Janet [name] Date: 01/20/2019 Gender: Female D.O.B: [birth year] Age (years): 73 Referring Provider: Allena Katz Height (inches): 72 Interpreting Physician: Baird Lyons MD, ABSM Weight (lbs): 380 RPSGT: Laren Everts BMI: 60 MRN: [id number] Neck Size: 16.75  CLINICAL INFORMATION Sleep Study Type: NPSG Indication for sleep study: Diabetes, Fatigue, Hypertension, Obesity, OSA Epworth Sleepiness Score: 14  SLEEP STUDY TECHNIQUE As per the AASM Manual for the Scoring of Sleep and Associated Events v2.3 (April 2016) with a hypopnea requiring 4% desaturations.  The channels recorded and monitored were frontal, central and occipital EEG, electrooculogram (EOG), submentalis EMG (chin), nasal and oral airflow, thoracic and abdominal wall motion, anterior tibialis EMG, snore microphone, electrocardiogram, and pulse oximetry.  MEDICATIONS Medications self-administered by patient taken the night of the study : none reported  SLEEP ARCHITECTURE The study was initiated at 10:45:27 PM and ended at 5:05:03 AM.  Sleep onset time was 43.5 minutes and the sleep efficiency was 72.0%%. The total sleep time was 273.5 minutes.  Stage REM latency was 298.5 minutes.  The patient spent 12.1%% of the night in stage N1 sleep, 76.4%% in stage N2 sleep, 0.0%% in stage N3 and 11.5% in REM.  Alpha intrusion was absent.  Supine sleep was 62.34%.  RESPIRATORY PARAMETERS The overall apnea/hypopnea index (AHI) was 4.6 per hour. There were 3 total apneas, including 2 obstructive, 1 central and 0 mixed apneas. There were 18 hypopneas and 19 RERAs.  The AHI during Stage REM sleep was 28.6 per hour.  AHI while supine was 7.4 per hour.  The mean oxygen saturation was 91.1%. The minimum SpO2 during sleep was 84.0%.  moderate snoring was noted during this study.  CARDIAC DATA The 2 lead EKG demonstrated sinus rhythm. The mean heart rate was 79.4 beats per minute. Other EKG  findings include: PVCs.  LEG MOVEMENT DATA The total PLMS were 0 with a resulting PLMS index of 0.0. Associated arousal with leg movement index was 0.0 .  IMPRESSIONS - Occasional obstructive sleep apnea occurred, within normal limits (AHI = 4.6/h). - No significant central sleep apnea occurred during this study (CAI = 0.2/h). - Mild oxygen desaturation was noted during this study (Min O2 = 84.0%). Mean sat 91.1%. - Time with O2 sat 88% or less was 4.4 minutess. - The patient snored with moderate snoring volume. - EKG findings include PVCs. - Clinically significant periodic limb movements did not occur during sleep. No significant associated arousals. - Patient had dificulty maintaining sleep with frequent brief, nonspecific awakenings. Is there history suggesting insomnia?  DIAGNOSIS - Primary snoring  RECOMMENDATIONS - Manage for snoring and insomnia based on clinical judgment. - Be careful with alcohol, sedatives and other CNS depressants that may worsen sleep apnea and disrupt normal sleep architecture. - Sleep hygiene should be reviewed to assess factors that may improve sleep quality. - Weight management and regular exercise should be initiated or continued if appropriate.  [Electronically signed] 01/24/2019 11:47 AM  Baird Lyons MD, ABSM Diplomate, American Board of Sleep Medicine   NPI: [id number]                          [place], [place] [facility]  ELECTRONICALLY SIGNED ON:  01/24/2019, 11:43 AM [place] PH: [phone]   FX: [phone] [place]

## 2019-02-03 ENCOUNTER — Ambulatory Visit: Payer: Medicaid Other | Admitting: Allergy and Immunology

## 2019-02-07 ENCOUNTER — Other Ambulatory Visit: Payer: Self-pay | Admitting: Allergy and Immunology

## 2019-02-07 ENCOUNTER — Other Ambulatory Visit: Payer: Self-pay | Admitting: Family Medicine

## 2019-02-07 DIAGNOSIS — E1149 Type 2 diabetes mellitus with other diabetic neurological complication: Secondary | ICD-10-CM

## 2019-02-10 ENCOUNTER — Other Ambulatory Visit: Payer: Self-pay

## 2019-02-10 ENCOUNTER — Ambulatory Visit: Payer: Medicaid Other | Attending: Family Medicine | Admitting: Family Medicine

## 2019-02-10 DIAGNOSIS — R2232 Localized swelling, mass and lump, left upper limb: Secondary | ICD-10-CM

## 2019-02-10 DIAGNOSIS — J4540 Moderate persistent asthma, uncomplicated: Secondary | ICD-10-CM | POA: Diagnosis not present

## 2019-02-10 DIAGNOSIS — J328 Other chronic sinusitis: Secondary | ICD-10-CM | POA: Diagnosis not present

## 2019-02-10 DIAGNOSIS — E1149 Type 2 diabetes mellitus with other diabetic neurological complication: Secondary | ICD-10-CM | POA: Diagnosis not present

## 2019-02-10 DIAGNOSIS — E039 Hypothyroidism, unspecified: Secondary | ICD-10-CM | POA: Diagnosis not present

## 2019-02-10 DIAGNOSIS — I10 Essential (primary) hypertension: Secondary | ICD-10-CM

## 2019-02-10 DIAGNOSIS — R21 Rash and other nonspecific skin eruption: Secondary | ICD-10-CM

## 2019-02-10 MED ORDER — ATORVASTATIN CALCIUM 20 MG PO TABS: 20.0000 mg | ORAL_TABLET | Freq: Every day | ORAL | 1 refills | Status: DC

## 2019-02-10 MED ORDER — METFORMIN HCL 500 MG PO TABS: 1000.0000 mg | ORAL_TABLET | Freq: Two times a day (BID) | ORAL | 1 refills | Status: DC

## 2019-02-10 MED ORDER — MONTELUKAST SODIUM 10 MG PO TABS: 10.0000 mg | ORAL_TABLET | Freq: Every day | ORAL | 1 refills | Status: DC

## 2019-02-10 MED ORDER — LEVOTHYROXINE SODIUM 150 MCG PO TABS: 150.0000 ug | ORAL_TABLET | Freq: Every day | ORAL | 0 refills | Status: DC

## 2019-02-10 MED ORDER — VICTOZA 18 MG/3ML ~~LOC~~ SOPN: PEN_INJECTOR | SUBCUTANEOUS | 6 refills | Status: DC

## 2019-02-10 MED ORDER — LOSARTAN POTASSIUM 50 MG PO TABS: ORAL_TABLET | ORAL | 1 refills | Status: DC

## 2019-02-10 NOTE — Progress Notes (Signed)
Patient has been called and DOB has been verified. Patient has been screened and transferred to PCP to start phone visit.     

## 2019-02-10 NOTE — Progress Notes (Signed)
Virtual Visit via Telephone Note  I connected with Janet [name], on 02/10/2019 at 9:40 AM by telephone due to the COVID-19 pandemic and verified that I am speaking with the correct person using two identifiers.   Consent: I discussed the limitations, risks, security and privacy concerns of performing an evaluation and management service by telephone and the availability of in person appointments. I also discussed with the patient that there may be a patient responsible charge related to this service. The patient expressed understanding and agreed to proceed.   Location of Patient: Home  Location of Provider: Clinic   Persons participating in Telemedicine visit: Madysin Crossin Farrington-CMA Dr. Margarita Rana     History of Present Illness: Janet [name] is a89 year old female with a history of type 2 diabetes mellitus (A1c of 6.7), Diabetic neuropathy, hypothyroidism, asthma, Seasonal allergies, endometrial adenocarcinoma (s/pTotal abdominal hysterectomy and bilateral salpingo-oophorectomy in 06/2016, adjuvant treatment due to low risk for recurrence), morbid obesity who presents today for a follow up visit.   I has referred her to Allergy and immunology due to uncontrolled allergies and she was in turn referred to ENT. She was seen by ENT with plans for sinus surgery and will need clearance for her procedure.  She is currently seeing Dr Letta Pate of Rehab for sacroiliac jont pain and s/p nerve blocks. One month ago she was seen by her Gyn Onc and thought to be stable; repeat CT scheduled for 12/2019 due to enlarged aortocaval nodes.  She has a lump on the back of her L arm which she noticed a while back which she thinks is getting bigger but not painful; she would like this checked out. On the back of both legs she has a rash and Triamcinolone has been ineffective. She is otherwise doing well and is compliant with her antihypertensive (Lisinopril switched to Losartan by  Allergist), statin diabetic medication. She has had no asthma flares.  Past Medical History:  Diagnosis Date  . Anemia   . Angio-edema   . Anxiety   . Asthma   . Depression   . Hypothyroidism   . Pre-diabetes   . Right foot drop   . Sleep apnea   . Thyroid disease   . Urticaria   . Uterine cancer (HCC)    Allergies  Allergen Reactions  . Ciprofloxacin Shortness Of Breath  . Shrimp [Shellfish Allergy] Other (See Comments)    positive allergy test    Current Outpatient Medications on File Prior to Visit  Medication Sig Dispense Refill  . albuterol (PROVENTIL) (2.5 MG/3ML) 0.083% nebulizer solution Take 3 mLs (2.5 mg total) by nebulization every 6 (six) hours as needed for wheezing or shortness of breath. 75 mL 3  . albuterol (VENTOLIN HFA) 108 (90 Base) MCG/ACT inhaler Inhale 1-2 puffs into the lungs every 6 (six) hours as needed for wheezing or shortness of breath. 54 g 3  . atorvastatin (LIPITOR) 20 MG tablet Take 1 tablet (20 mg total) by mouth daily. 30 tablet 3  . budesonide-formoterol (SYMBICORT) 160-4.5 MCG/ACT inhaler Inhale 2 puffs into the lungs 2 (two) times daily. 1 Inhaler 5  . cholecalciferol (VITAMIN D) 1000 units tablet Take 1,000 Units by mouth at bedtime.    . clindamycin (CLEOCIN) 150 MG capsule Take 1 capsule 3 times daily for 21 days 63 capsule 0  . DULoxetine (CYMBALTA) 60 MG capsule Take 1 capsule (60 mg total) by mouth daily. 90 capsule 4  . EQ ALLERGY RELIEF, CETIRIZINE, 10 MG tablet Take 1 tablet  by mouth once daily 90 tablet 0  . fluticasone (FLONASE) 50 MCG/ACT nasal spray Place 2 sprays into both nostrils daily. 16 g 6  . Insulin Pen Needle (TRUEPLUS PEN NEEDLES) 32G X 4 MM MISC Use to inject Victoza daily. 100 each 11  . levothyroxine (SYNTHROID) 150 MCG tablet Take 1 tablet (150 mcg total) by mouth daily before breakfast. 90 tablet 0  . liraglutide (VICTOZA) 18 MG/3ML SOPN Inject subcutaneously daily 0.6 mg for 1 week then 1.2 mg for 1 week then 1.8  mg thereafter 30 mL 3  . losartan (COZAAR) 50 MG tablet TAKE 1 TABLET BY MOUTH ONCE DAILY. REPLACES LISINOPRIL. 30 tablet 0  . metFORMIN (GLUCOPHAGE) 500 MG tablet TAKE 2 TABLETS BY MOUTH TWICE DAILY WITH MEALS 120 tablet 0  . methocarbamol (ROBAXIN) 500 MG tablet Take 2 tablets (1,000 mg total) by mouth 2 (two) times daily. 120 tablet 1  . montelukast (SINGULAIR) 10 MG tablet Take 1 tablet (10 mg total) by mouth at bedtime. 90 tablet 1  . Multiple Vitamin (MULTIVITAMIN) tablet Take 1 tablet by mouth daily.    Marland Kitchen omeprazole (PRILOSEC) 40 MG capsule Take 1 capsule (40 mg total) by mouth daily. 30 capsule 5  . OXcarbazepine (TRILEPTAL) 150 MG tablet Take 1 tablet (150 mg total) by mouth 2 (two) times daily. 180 tablet 1  . pregabalin (LYRICA) 75 MG capsule Take 1 capsule (75 mg total) by mouth 2 (two) times daily. 60 capsule 5  . predniSONE (DELTASONE) 5 MG tablet Take three tablets (15mg ) daily for seven days. Then take two tablets (10mg ) daily for seven days. Then take one tablet (5mg ) daily for seven days. 42 tablet 0   No current facility-administered medications on file prior to visit.    Observations/Objective: Awake, alert, oriented x3 Not in acute distress  Lab Results  Component Value Date   HGBA1C 6.7 11/09/2018    CMP Latest Ref Rng & Units 11/09/2018 06/16/2018 12/16/2017  Glucose 65 - 99 mg/dL 131(H) 330(H) 127(H)  BUN 6 - 24 mg/dL 6 9 8   Creatinine 0.57 - 1.00 mg/dL 0.73 0.60 0.74  Sodium 134 - 144 mmol/L 143 138 143  Potassium 3.5 - 5.2 mmol/L 3.8 4.5 4.0  Chloride 96 - 106 mmol/L 104 98 108  CO2 20 - 29 mmol/L 23 24 28   Calcium 8.7 - 10.2 mg/dL 9.8 9.4 9.0  Total Protein 6.0 - 8.5 g/dL 6.6 6.7 -  Total Bilirubin 0.0 - 1.2 mg/dL 1.6(H) 1.3(H) -  Alkaline Phos 39 - 117 IU/L 131(H) 152(H) -  AST 0 - 40 IU/L 41(H) 33 -  ALT 0 - 32 IU/L 25 23 -    Lipid Panel     Component Value Date/Time   CHOL 147 11/09/2018 1016   TRIG 93 11/09/2018 1016   HDL 71 11/09/2018 1016    CHOLHDL 2.1 11/09/2018 1016   CHOLHDL 2.1 12/26/2015 0929   VLDL 15 12/26/2015 0929   LDLCALC 59 11/09/2018 1016   LABVLDL 17 11/09/2018 1016    Lab Results  Component Value Date   TSH 0.027 (L) 11/09/2018     Assessment and Plan: 1. Hypothyroidism, unspecified type Uncontrolled TSH at next visit and will adjust Levothyroxine dose accordingly - levothyroxine (SYNTHROID) 150 MCG tablet; Take 1 tablet (150 mcg total) by mouth daily before breakfast.  Dispense: 30 tablet; Refill: 0  2. Type 2 diabetes mellitus with other neurologic complication, without long-term current use of insulin (HCC) Controlled with A1c of 6.7 Continue  current regimen Counseled on Diabetic diet, my plate method, [id number] minutes of moderate intensity exercise/week Blood sugar logs with fasting goals of 80-120 mg/dl, random of less than 180 and in the event of sugars less than 60 mg/dl or greater than 400 mg/dl encouraged to notify the clinic. Advised on the need for annual eye exams, annual foot exams, Pneumonia vaccine. - atorvastatin (LIPITOR) 20 MG tablet; Take 1 tablet (20 mg total) by mouth daily.  Dispense: 90 tablet; Refill: 1 - liraglutide (VICTOZA) 18 MG/3ML SOPN; Inject subcutaneously daily 0.6 mg for 1 week then 1.2 mg for 1 week then 1.8 mg thereafter  Dispense: 30 mL; Refill: 6 - metFORMIN (GLUCOPHAGE) 500 MG tablet; Take 2 tablets (1,000 mg total) by mouth 2 (two) times daily with a meal.  Dispense: 360 tablet; Refill: 1  3. Moderate persistent asthma without complication Stable - montelukast (SINGULAIR) 10 MG tablet; Take 1 tablet (10 mg total) by mouth at bedtime.  Dispense: 90 tablet; Refill: 1  4. Other chronic sinusitis Uncontrolled Anticipated sinus surgery EKG at her next visit after which I will give clearance  5. Rash and nonspecific skin eruption Uncontrolled on Triamcinolone Will bring in for in person visit  6. Mass of left upper extremity Needs an in person evaluation  7.  Essential hypertension Stable Counseled on blood pressure goal of less than 130/80, low-sodium, DASH diet, medication compliance, 150 minutes of moderate intensity exercise per week. Discussed medication compliance, adverse effects.  - losartan (COZAAR) 50 MG tablet; TAKE 1 TABLET BY MOUTH ONCE DAILY. REPLACES LISINOPRIL.  Dispense: 90 tablet; Refill: 1   Follow Up Instructions: Return in about 12 days (around 02/22/2019).    I discussed the assessment and treatment plan with the patient. The patient was provided an opportunity to ask questions and all were answered. The patient agreed with the plan and demonstrated an understanding of the instructions.   The patient was advised to call back or seek an in-person evaluation if the symptoms worsen or if the condition fails to improve as anticipated.     I provided 22 minutes total of non-face-to-face time during this encounter including median intraservice time, reviewing previous notes, labs, imaging, medications, management and patient verbalized understanding.     Charlott Rakes, MD, FAAFP. [facility] and [place], [place]   02/10/2019, 9:40 AM

## 2019-02-11 ENCOUNTER — Encounter: Payer: Self-pay | Admitting: Family Medicine

## 2019-02-11 ENCOUNTER — Other Ambulatory Visit: Payer: Self-pay

## 2019-02-11 ENCOUNTER — Ambulatory Visit (INDEPENDENT_AMBULATORY_CARE_PROVIDER_SITE_OTHER): Payer: Medicaid Other | Admitting: Allergy and Immunology

## 2019-02-11 VITALS — BP 138/70 | HR 100 | Temp 98.3°F | Resp 16

## 2019-02-11 DIAGNOSIS — J324 Chronic pansinusitis: Secondary | ICD-10-CM

## 2019-02-11 DIAGNOSIS — E662 Morbid (severe) obesity with alveolar hypoventilation: Secondary | ICD-10-CM | POA: Diagnosis not present

## 2019-02-11 DIAGNOSIS — J4550 Severe persistent asthma, uncomplicated: Secondary | ICD-10-CM

## 2019-02-11 DIAGNOSIS — J3089 Other allergic rhinitis: Secondary | ICD-10-CM | POA: Diagnosis not present

## 2019-02-11 DIAGNOSIS — K219 Gastro-esophageal reflux disease without esophagitis: Secondary | ICD-10-CM

## 2019-02-11 NOTE — Progress Notes (Signed)
[place] [facility] [place] [facility] [place]   Follow-up Note  Referring Provider: Charlott Rakes, MD Primary Provider: Charlott Rakes, MD Date of Office Visit: 02/11/2019  Subjective:   Janet [name] (DOB: [birth year]) is a 48 y.o. female who returns to the Allergy and [place] on 02/11/2019 in re-evaluation of the following:  HPI: Caitlynd returns to this clinic in evaluation of severe asthma, allergic rhinitis, chronic sinusitis, suspected obesity/hypoventilation syndrome, exercise-induced hypoxemia, and LPR.  Her last visit to this clinic was 30 December 2018.  She believes that her asthma is doing much better at this point in time.  She does not really have a deep cough and she does not really have wheezing and she does not need to use her bronchodilator and she does not use her oxygen at this point while exerting herself.  She has finished her slow taper of systemic steroids.  She still does continue to have lots of nasal congestion and she has a throat clearing cough and yellow mucus from both her nose and postnasal drip.  She has finished her 3-week course of clindamycin and has visited with ENT who has suggested sinus surgery.  Her reflux is going quite well at this point in time.  Allergies as of 02/11/2019      Reactions   Ciprofloxacin Shortness Of Breath   Shrimp [shellfish Allergy] Other (See Comments)   positive allergy test      Medication List    albuterol (2.5 MG/3ML) 0.083% nebulizer solution Commonly known as: PROVENTIL Take 3 mLs (2.5 mg total) by nebulization every 6 (six) hours as needed for wheezing or shortness of breath.   albuterol 108 (90 Base) MCG/ACT inhaler Commonly known as: VENTOLIN HFA Inhale 1-2 puffs into the lungs every 6 (six) hours as needed for wheezing or shortness of breath.   atorvastatin 20 MG tablet Commonly known as: LIPITOR Take 1 tablet (20 mg total) by mouth daily.   budesonide-formoterol 160-4.5  MCG/ACT inhaler Commonly known as: Symbicort Inhale 2 puffs into the lungs 2 (two) times daily.   cholecalciferol 1000 units tablet Commonly known as: VITAMIN D Take 1,000 Units by mouth at bedtime.   DULoxetine 60 MG capsule Commonly known as: Cymbalta Take 1 capsule (60 mg total) by mouth daily.   EQ Allergy Relief (Cetirizine) 10 MG tablet Generic drug: cetirizine Take 1 tablet by mouth once daily   fluticasone 50 MCG/ACT nasal spray Commonly known as: FLONASE Place 2 sprays into both nostrils daily.   levothyroxine 150 MCG tablet Commonly known as: SYNTHROID Take 1 tablet (150 mcg total) by mouth daily before breakfast.   losartan 50 MG tablet Commonly known as: COZAAR TAKE 1 TABLET BY MOUTH ONCE DAILY. REPLACES LISINOPRIL.   metFORMIN 500 MG tablet Commonly known as: GLUCOPHAGE Take 2 tablets (1,000 mg total) by mouth 2 (two) times daily with a meal.   methocarbamol 500 MG tablet Commonly known as: ROBAXIN Take 2 tablets (1,000 mg total) by mouth 2 (two) times daily.   montelukast 10 MG tablet Commonly known as: SINGULAIR Take 1 tablet (10 mg total) by mouth at bedtime.   multivitamin tablet Take 1 tablet by mouth daily.   omeprazole 40 MG capsule Commonly known as: PRILOSEC Take 1 capsule (40 mg total) by mouth daily.   OXcarbazepine 150 MG tablet Commonly known as: TRILEPTAL Take 1 tablet (150 mg total) by mouth 2 (two) times daily.   pregabalin 75 MG capsule Commonly known as: LYRICA Take 1  capsule (75 mg total) by mouth 2 (two) times daily.   TRUEplus Pen Needles 32G X 4 MM Misc Generic drug: Insulin Pen Needle Use to inject Victoza daily.   Victoza 18 MG/3ML Sopn Generic drug: liraglutide Inject subcutaneously daily 0.6 mg for 1 week then 1.2 mg for 1 week then 1.8 mg thereafter       Past Medical History:  Diagnosis Date  . Anemia   . Angio-edema   . Anxiety   . Asthma   . Depression   . Hypothyroidism   . Pre-diabetes   . Right  foot drop   . Sleep apnea   . Thyroid disease   . Urticaria   . Uterine cancer [facility])     Past Surgical History:  Procedure Laterality Date  . ABDOMINAL HYSTERECTOMY    . CARPAL TUNNEL RELEASE    . CESAREAN SECTION    . CHOLECYSTECTOMY    . cyst removal     uterus  . DILATION AND CURETTAGE OF UTERUS     still birth  . DILATION AND CURETTAGE OF UTERUS N/A 06/11/2016   Procedure: DILATATION AND CURETTAGE;  Surgeon: Emily Filbert, MD;  Location: [facility];  Service: Gynecology;  Laterality: N/A;  . ROBOTIC ASSISTED TOTAL HYSTERECTOMY WITH BILATERAL SALPINGO OOPHERECTOMY Bilateral 07/09/2016   Procedure: XI ROBOTIC ASSISTED TOTAL HYSTERECTOMY WITH BILATERAL SALPINGO OOPHORECTOMY FOR UTERUS GREATER THAN 250 GRAMS, LYSIS OF ADHESIONS;  Surgeon: Everitt Amber, MD;  Location: [facility];  Service: Gynecology;  Laterality: Bilateral;  . SINOSCOPY    . SINUS EXPLORATION    . WISDOM TOOTH EXTRACTION      Review of systems negative except as noted in HPI / PMHx or noted below:  Review of Systems  Constitutional: Negative.   HENT: Negative.   Eyes: Negative.   Respiratory: Negative.   Cardiovascular: Negative.   Gastrointestinal: Negative.   Genitourinary: Negative.   Musculoskeletal: Negative.   Skin: Negative.   Neurological: Negative.   Endo/Heme/Allergies: Negative.   Psychiatric/Behavioral: Negative.      Objective:   Vitals:   02/11/19 1115 02/11/19 1129  BP: 138/70   Pulse: 100   Resp: 16   Temp: 98.3 F (36.8 C)   SpO2: 95% 92%          Physical Exam Constitutional:      Appearance: She is not diaphoretic.  HENT:     Head: Normocephalic.     Right Ear: Tympanic membrane, ear canal and external ear normal.     Left Ear: Tympanic membrane, ear canal and external ear normal.     Nose: Nose normal. No mucosal edema or rhinorrhea.     Mouth/Throat:     Pharynx: Uvula midline. No oropharyngeal exudate.  Eyes:     Conjunctiva/sclera: Conjunctivae normal.  Neck:      Thyroid: No thyromegaly.     Trachea: Trachea normal. No tracheal tenderness or tracheal deviation.  Cardiovascular:     Rate and Rhythm: Normal rate and regular rhythm.     Heart sounds: Normal heart sounds, S1 normal and S2 normal. No murmur.  Pulmonary:     Effort: No respiratory distress.     Breath sounds: Normal breath sounds. No stridor. No wheezing or rales.  Lymphadenopathy:     Head:     Right side of head: No tonsillar adenopathy.     Left side of head: No tonsillar adenopathy.     Cervical: No cervical adenopathy.  Skin:    Findings: No erythema  or rash.     Nails: There is no clubbing.  Neurological:     Mental Status: She is alert.     Diagnostics:    Spirometry was performed and demonstrated an FEV1 of 2.56 at 83 % of predicted.  Oxygen saturation was 95% on room air at rest.  Oxygen saturation was 92% on room air while walking the hallway.  Results of a sinus CT scan obtained 11 January 2019 identified widespread mucosal thickening in all sinus cavities with some hyperdense mucus.  It was suggested at that point in time that she start clindamycin 150 - 3 times a day for 3 weeks and visit with ENT.  Results of a sleep study obtained 20 January 2019 identified AHI equal 4.6/H, CDAI equals 0.2/H, oxygen saturation below 88% of 4.4 minutes and snoring.  Assessment and Plan:   1. Not well controlled severe persistent asthma   2. Other allergic rhinitis   3. Chronic pansinusitis   4. Obesity hypoventilation syndrome ([place])   5. LPRD (laryngopharyngeal reflux disease)     1.  Continue to perform allergen avoidance measures -wash animals & out of bedroom, HEPA filter  2.  Continue to treat and inflammation:   A.  Symbicort 160 - 2 inhalations twice per day  B.  Flonase - 1 spray each nostril twice a day  C.  Montelukast 10 mg - 1 tablet 1 time per day  D.  Dupilumab injections every 2 weeks  3.  Continue to treat reflux:   A.  Slowly consolidate caffeine and  chocolate consumption  B.  Omeprazole 40 mg - 1 tablet 1 time per day  4.  Continue oxygen 2 L nasal cannula during exertion if needed  5. Visit with Dr. Gaylyn Cheers for sinus surgery   6. Start immunotherapy after sinus surgery  7.  Obtain flu vaccine and Covid vaccine  8.  Return to clinic in 8 weeks or earlier if problem  [place] is better in some ways and this is a result of using anti-inflammatory agents for her airway as noted above and continuing to address the issue of reflux but she still has chronic sinusitis and this is still causing her problems.  I do not think that the mucus that is stuck in her head is going to be able to remove itself without some form of anatomical reconstruction of her sinus cavities and I did recommend to her today that she consider having her sinus surgery.  I think she would benefit from a course of immunotherapy as she is very atopic and we will get that arranged soon after she works through the logistics of sinus surgery.  It does appear as though her airway status has improved to the extent that she no longer requires oxygen while exercising.  I did recommend that she obtain the flu vaccine and Covid vaccine.  I will see her back in this clinic in 8 weeks or earlier if there is a problem.  Allena Katz, MD Allergy / Immunology [place]

## 2019-02-11 NOTE — Patient Instructions (Addendum)
  1.  Continue to perform allergen avoidance measures -wash animals & out of bedroom, HEPA filter  2.  Continue to treat and inflammation:   A.  Symbicort 160 - 2 inhalations twice per day  B.  Flonase - 1 spray each nostril twice a day  C.  Montelukast 10 mg - 1 tablet 1 time per day  D.  Dupilumab injections every 2 weeks  3.  Continue to treat reflux:   A.  Slowly consolidate caffeine and chocolate consumption  B.  Omeprazole 40 mg - 1 tablet 1 time per day  4.  Continue oxygen 2 L nasal cannula during exertion if needed  5. Visit with Dr. Gaylyn Cheers for sinus surgery   6. Start immunotherapy after sinus surgery  7.  Obtain flu vaccine and Covid vaccine  8.  Return to clinic in 8 weeks or earlier if problem

## 2019-02-15 ENCOUNTER — Encounter: Payer: Self-pay | Admitting: Allergy and Immunology

## 2019-02-18 ENCOUNTER — Encounter: Payer: Medicaid Other | Admitting: Physical Medicine & Rehabilitation

## 2019-02-22 ENCOUNTER — Ambulatory Visit: Payer: Medicaid Other | Admitting: Family Medicine

## 2019-03-01 ENCOUNTER — Other Ambulatory Visit: Payer: Self-pay | Admitting: Family Medicine

## 2019-03-03 ENCOUNTER — Ambulatory Visit: Payer: Medicaid Other | Attending: Family Medicine | Admitting: Family Medicine

## 2019-03-03 ENCOUNTER — Other Ambulatory Visit: Payer: Self-pay

## 2019-03-03 ENCOUNTER — Encounter: Payer: Self-pay | Admitting: Family Medicine

## 2019-03-03 VITALS — BP 143/71 | HR 86 | Ht 67.0 in | Wt 398.0 lb

## 2019-03-03 DIAGNOSIS — G4730 Sleep apnea, unspecified: Secondary | ICD-10-CM | POA: Insufficient documentation

## 2019-03-03 DIAGNOSIS — Z6841 Body Mass Index (BMI) 40.0 and over, adult: Secondary | ICD-10-CM | POA: Diagnosis not present

## 2019-03-03 DIAGNOSIS — Z90710 Acquired absence of both cervix and uterus: Secondary | ICD-10-CM | POA: Diagnosis not present

## 2019-03-03 DIAGNOSIS — Z79890 Hormone replacement therapy (postmenopausal): Secondary | ICD-10-CM | POA: Insufficient documentation

## 2019-03-03 DIAGNOSIS — E1149 Type 2 diabetes mellitus with other diabetic neurological complication: Secondary | ICD-10-CM | POA: Diagnosis not present

## 2019-03-03 DIAGNOSIS — Z90722 Acquired absence of ovaries, bilateral: Secondary | ICD-10-CM | POA: Diagnosis not present

## 2019-03-03 DIAGNOSIS — Z881 Allergy status to other antibiotic agents status: Secondary | ICD-10-CM | POA: Diagnosis not present

## 2019-03-03 DIAGNOSIS — J45909 Unspecified asthma, uncomplicated: Secondary | ICD-10-CM | POA: Diagnosis not present

## 2019-03-03 DIAGNOSIS — Z7984 Long term (current) use of oral hypoglycemic drugs: Secondary | ICD-10-CM | POA: Insufficient documentation

## 2019-03-03 DIAGNOSIS — F329 Major depressive disorder, single episode, unspecified: Secondary | ICD-10-CM | POA: Insufficient documentation

## 2019-03-03 DIAGNOSIS — E039 Hypothyroidism, unspecified: Secondary | ICD-10-CM | POA: Diagnosis not present

## 2019-03-03 DIAGNOSIS — L309 Dermatitis, unspecified: Secondary | ICD-10-CM | POA: Diagnosis not present

## 2019-03-03 DIAGNOSIS — Z91013 Allergy to seafood: Secondary | ICD-10-CM | POA: Diagnosis not present

## 2019-03-03 DIAGNOSIS — F419 Anxiety disorder, unspecified: Secondary | ICD-10-CM | POA: Insufficient documentation

## 2019-03-03 DIAGNOSIS — Z7951 Long term (current) use of inhaled steroids: Secondary | ICD-10-CM | POA: Diagnosis not present

## 2019-03-03 DIAGNOSIS — Z8249 Family history of ischemic heart disease and other diseases of the circulatory system: Secondary | ICD-10-CM | POA: Insufficient documentation

## 2019-03-03 DIAGNOSIS — E1140 Type 2 diabetes mellitus with diabetic neuropathy, unspecified: Secondary | ICD-10-CM | POA: Insufficient documentation

## 2019-03-03 DIAGNOSIS — E119 Type 2 diabetes mellitus without complications: Secondary | ICD-10-CM | POA: Diagnosis present

## 2019-03-03 DIAGNOSIS — Z8542 Personal history of malignant neoplasm of other parts of uterus: Secondary | ICD-10-CM | POA: Diagnosis not present

## 2019-03-03 DIAGNOSIS — I498 Other specified cardiac arrhythmias: Secondary | ICD-10-CM | POA: Insufficient documentation

## 2019-03-03 DIAGNOSIS — Z79899 Other long term (current) drug therapy: Secondary | ICD-10-CM | POA: Insufficient documentation

## 2019-03-03 DIAGNOSIS — Z01818 Encounter for other preprocedural examination: Secondary | ICD-10-CM

## 2019-03-03 LAB — POCT GLYCOSYLATED HEMOGLOBIN (HGB A1C): HbA1c, POC (controlled diabetic range): 6.7 % (ref 0.0–7.0)

## 2019-03-03 LAB — GLUCOSE, POCT (MANUAL RESULT ENTRY): POC Glucose: 142 mg/dl — AB (ref 70–99)

## 2019-03-03 NOTE — Progress Notes (Signed)
Has rash on back of legs(calf area) Arms are broken out as well.

## 2019-03-03 NOTE — Progress Notes (Signed)
Subjective:  Patient ID: Janet [name], female    DOB: 06-Sep-1970  Age: 49 y.o. MRN: [id number]  CC: Diabetes   HPI Janet [name] is a49 year old female with a history of type 2 diabetes mellitus (A1c of6.7), Diabetic neuropathy, hypothyroidism, asthma, Seasonal allergies, endometrial adenocarcinoma (s/pTotal abdominal hysterectomy and bilateral salpingo-oophorectomy in 06/2016, adjuvant treatment due to low risk for recurrence), morbid obesity who presents todayfor an acute visit. She needs Pre-op clearance prior to sinus surgery.  She has a rash on both arms, legs which has been present for the last couple of months and is sometimes pruritic. She has no rash on her face or trunk. Only new medication she can think of is Dupixent which she was prescribed by Allergy specialist. Currently not applying any creams to it. She has no dyspnea, edema, chest pain. Past Medical History:  Diagnosis Date  . Anemia   . Angio-edema   . Anxiety   . Asthma   . Depression   . Hypothyroidism   . Pre-diabetes   . Right foot drop   . Sleep apnea   . Thyroid disease   . Urticaria   . Uterine cancer [facility])     Past Surgical History:  Procedure Laterality Date  . ABDOMINAL HYSTERECTOMY    . CARPAL TUNNEL RELEASE    . CESAREAN SECTION    . CHOLECYSTECTOMY    . cyst removal     uterus  . DILATION AND CURETTAGE OF UTERUS     still birth  . DILATION AND CURETTAGE OF UTERUS N/A 06/11/2016   Procedure: DILATATION AND CURETTAGE;  Surgeon: Emily Filbert, MD;  Location: [facility];  Service: Gynecology;  Laterality: N/A;  . ROBOTIC ASSISTED TOTAL HYSTERECTOMY WITH BILATERAL SALPINGO OOPHERECTOMY Bilateral 07/09/2016   Procedure: XI ROBOTIC ASSISTED TOTAL HYSTERECTOMY WITH BILATERAL SALPINGO OOPHORECTOMY FOR UTERUS GREATER THAN 250 GRAMS, LYSIS OF ADHESIONS;  Surgeon: Everitt Amber, MD;  Location: [facility];  Service: Gynecology;  Laterality: Bilateral;  . SINOSCOPY    . SINUS EXPLORATION    . WISDOM  TOOTH EXTRACTION      Family History  Problem Relation Age of Onset  . Heart attack Mother   . Allergic rhinitis Mother   . Suicidality Father     Allergies  Allergen Reactions  . Ciprofloxacin Shortness Of Breath  . Shrimp [Shellfish Allergy] Other (See Comments)    positive allergy test    Outpatient Medications Prior to Visit  Medication Sig Dispense Refill  . albuterol (PROVENTIL) (2.5 MG/3ML) 0.083% nebulizer solution Take 3 mLs (2.5 mg total) by nebulization every 6 (six) hours as needed for wheezing or shortness of breath. 75 mL 3  . albuterol (VENTOLIN HFA) 108 (90 Base) MCG/ACT inhaler Inhale 1-2 puffs into the lungs every 6 (six) hours as needed for wheezing or shortness of breath. 54 g 3  . atorvastatin (LIPITOR) 20 MG tablet Take 1 tablet (20 mg total) by mouth daily. 90 tablet 1  . budesonide-formoterol (SYMBICORT) 160-4.5 MCG/ACT inhaler Inhale 2 puffs into the lungs 2 (two) times daily. 1 Inhaler 5  . cholecalciferol (VITAMIN D) 1000 units tablet Take 1,000 Units by mouth at bedtime.    . DULoxetine (CYMBALTA) 60 MG capsule Take 1 capsule (60 mg total) by mouth daily. 90 capsule 4  . EQ ALLERGY RELIEF, CETIRIZINE, 10 MG tablet Take 1 tablet by mouth once daily 90 tablet 0  . fluticasone (FLONASE) 50 MCG/ACT nasal spray Place 2 sprays into both nostrils daily. 16 g 6  .  Insulin Pen Needle (TRUEPLUS PEN NEEDLES) 32G X 4 MM MISC Use to inject Victoza daily. 100 each 11  . levothyroxine (SYNTHROID) 150 MCG tablet Take 1 tablet (150 mcg total) by mouth daily before breakfast. 30 tablet 0  . liraglutide (VICTOZA) 18 MG/3ML SOPN Inject subcutaneously daily 0.6 mg for 1 week then 1.2 mg for 1 week then 1.8 mg thereafter 30 mL 6  . losartan (COZAAR) 50 MG tablet TAKE 1 TABLET BY MOUTH ONCE DAILY. REPLACES LISINOPRIL. 90 tablet 1  . metFORMIN (GLUCOPHAGE) 500 MG tablet Take 2 tablets (1,000 mg total) by mouth 2 (two) times daily with a meal. 360 tablet 1  . methocarbamol  (ROBAXIN) 500 MG tablet Take 2 tablets (1,000 mg total) by mouth 2 (two) times daily. 120 tablet 1  . montelukast (SINGULAIR) 10 MG tablet Take 1 tablet (10 mg total) by mouth at bedtime. 90 tablet 1  . Multiple Vitamin (MULTIVITAMIN) tablet Take 1 tablet by mouth daily.    Marland Kitchen omeprazole (PRILOSEC) 40 MG capsule Take 1 capsule (40 mg total) by mouth daily. 30 capsule 5  . OXcarbazepine (TRILEPTAL) 150 MG tablet Take 1 tablet (150 mg total) by mouth 2 (two) times daily. 180 tablet 1  . pregabalin (LYRICA) 75 MG capsule Take 1 capsule (75 mg total) by mouth 2 (two) times daily. 60 capsule 5   No facility-administered medications prior to visit.     ROS Review of Systems  Constitutional: Negative for activity change, appetite change and fatigue.  HENT: Negative for congestion, sinus pressure and sore throat.   Eyes: Negative for visual disturbance.  Respiratory: Negative for cough, chest tightness, shortness of breath and wheezing.   Cardiovascular: Negative for chest pain and palpitations.  Gastrointestinal: Negative for abdominal distention, abdominal pain and constipation.  Endocrine: Negative for polydipsia.  Genitourinary: Negative for dysuria and frequency.  Musculoskeletal: Negative for arthralgias and back pain.  Skin: Positive for rash.  Neurological: Negative for tremors, light-headedness and numbness.  Hematological: Does not bruise/bleed easily.  Psychiatric/Behavioral: Negative for agitation and behavioral problems.    Objective:  BP (!) 143/71   Pulse 86   Ht 5\' 7"  (1.702 m)   Wt (!) 398 lb (180.5 kg)   LMP  (LMP Unknown) Comment: irregular periods previously, bleeding started in january  SpO2 98%   BMI 62.34 kg/m   BP/Weight 03/03/2019 02/11/2019 [id number]  Systolic BP [id number] 0000000 -  Diastolic BP 71 70 -  Wt. (Lbs) 398 - 380  BMI 62.34 - 59.52      Physical Exam Constitutional:      Appearance: She is well-developed.  Neck:     Vascular: No JVD.   Cardiovascular:     Rate and Rhythm: Normal rate.     Heart sounds: Normal heart sounds. No murmur.  Pulmonary:     Effort: Pulmonary effort is normal.     Breath sounds: Normal breath sounds. No wheezing or rales.  Chest:     Chest wall: No tenderness.  Abdominal:     General: Bowel sounds are normal. There is no distension.     Palpations: Abdomen is soft. There is no mass.     Tenderness: There is no abdominal tenderness.  Musculoskeletal:        General: Normal range of motion.     Right lower leg: No edema.     Left lower leg: No edema.  Skin:    Comments: Plaques which are confluent on dorsal aspect of b/l forearm B/l  calf with large blanching erythematous patches; R>L No rash on trunk.  Neurological:     Mental Status: She is alert and oriented to person, place, and time.  Psychiatric:        Mood and Affect: Mood normal.     CMP Latest Ref Rng & Units 11/09/2018 06/16/2018 12/16/2017  Glucose 65 - 99 mg/dL 131(H) 330(H) 127(H)  BUN 6 - 24 mg/dL 6 9 8   Creatinine 0.57 - 1.00 mg/dL 0.73 0.60 0.74  Sodium 134 - 144 mmol/L 143 138 143  Potassium 3.5 - 5.2 mmol/L 3.8 4.5 4.0  Chloride 96 - 106 mmol/L 104 98 108  CO2 20 - 29 mmol/L 23 24 28   Calcium 8.7 - 10.2 mg/dL 9.8 9.4 9.0  Total Protein 6.0 - 8.5 g/dL 6.6 6.7 -  Total Bilirubin 0.0 - 1.2 mg/dL 1.6(H) 1.3(H) -  Alkaline Phos 39 - 117 IU/L 131(H) 152(H) -  AST 0 - 40 IU/L 41(H) 33 -  ALT 0 - 32 IU/L 25 23 -    Lipid Panel     Component Value Date/Time   CHOL 147 11/09/2018 1016   TRIG 93 11/09/2018 1016   HDL 71 11/09/2018 1016   CHOLHDL 2.1 11/09/2018 1016   CHOLHDL 2.1 12/26/2015 0929   VLDL 15 12/26/2015 0929   LDLCALC 59 11/09/2018 1016    CBC    Component Value Date/Time   WBC 5.7 12/08/2018 1507   WBC 3.0 (L) 09/26/2017 1925   RBC 4.85 12/08/2018 1507   RBC 4.79 09/26/2017 1925   HGB 15.0 12/08/2018 1507   HCT 43.4 12/08/2018 1507   PLT 84 (LL) 12/08/2018 1507   MCV 90 12/08/2018 1507   MCH  30.9 12/08/2018 1507   MCH 24.0 (L) 09/26/2017 1925   MCHC 34.6 12/08/2018 1507   MCHC 30.6 09/26/2017 1925   RDW 13.7 12/08/2018 1507   LYMPHSABS 1.0 12/08/2018 1507   MONOABS 0.2 07/02/2016 1035   EOSABS 0.1 12/08/2018 1507   BASOSABS 0.0 12/08/2018 1507    Lab Results  Component Value Date   HGBA1C 6.7 03/03/2019    Assessment & Plan:  1. Type 2 diabetes mellitus with other neurologic complication, without long-term current use of insulin (HCC) Controlled with a1c of 6.7 Chronic disease management was performed 1 month ago Continue current management - POCT glucose (manual entry) - POCT glycosylated hemoglobin (Hb A1C)  2. Dermatitis Possible allergic reaction to Dupixent Advised to discuss with allergist Will refer to Dermatologist if not Dupixent related Placed on Topical steroid - triamcinolone cream (KENALOG) 0.1 %; Apply 1 application topically 2 (two) times daily.  Dispense: 80 g; Refill: 0  3. Preop examination EKG reveals sinus arrhythmia - likely respiratory origin No further work up indicated Low cardiac risk for low risk surgery Provided clearance note    Follow-up: Return in about 3 months (around 06/01/2019) for Chronic medical conditions.       Charlott Rakes, MD, FAAFP. [facility] [place], [place]   03/03/2019, 4:19 PM

## 2019-03-04 ENCOUNTER — Other Ambulatory Visit: Payer: Self-pay

## 2019-03-04 ENCOUNTER — Encounter: Payer: Medicaid Other | Attending: Physical Medicine & Rehabilitation | Admitting: Physical Medicine & Rehabilitation

## 2019-03-04 ENCOUNTER — Encounter: Payer: Self-pay | Admitting: Physical Medicine & Rehabilitation

## 2019-03-04 VITALS — BP 132/68 | HR 85 | Temp 97.5°F | Ht 67.0 in | Wt 399.0 lb

## 2019-03-04 DIAGNOSIS — G8929 Other chronic pain: Secondary | ICD-10-CM | POA: Insufficient documentation

## 2019-03-04 DIAGNOSIS — M533 Sacrococcygeal disorders, not elsewhere classified: Secondary | ICD-10-CM | POA: Insufficient documentation

## 2019-03-04 MED ORDER — TRIAMCINOLONE ACETONIDE 0.1 % EX CREA: 1.0000 "application " | TOPICAL_CREAM | Freq: Two times a day (BID) | CUTANEOUS | 0 refills | Status: DC

## 2019-03-04 NOTE — Patient Instructions (Signed)
You had a radio frequency procedure today This was done to alleviate joint pain in your lumbar area We injected lidocaine which is a local anesthetic.  You may experience soreness at the injection sites. You may also experienced some irritation of the nerves that were heated I'm recommending ice for 30 minutes every 2 hours as needed for the next 24-48 hours   

## 2019-03-04 NOTE — Progress Notes (Signed)
  PROCEDURE RECORD [place] [facility]   Name: Janet [name] DOB:September 22, 1970 MRN: [id number]  Date:03/04/2019  Physician: Alysia Penna, MD    Nurse/CMA: Valari Taylor, CMA  Allergies:  Allergies  Allergen Reactions  . Ciprofloxacin Shortness Of Breath  . Shrimp [Shellfish Allergy] Other (See Comments)    positive allergy test    Consent Signed: Yes.    Is patient diabetic? Yes.    CBG today? 145  Pregnant: No. LMP: No LMP recorded (lmp unknown). Patient has had a hysterectomy. (age 59-55)  Anticoagulants: no Anti-inflammatory: no Antibiotics: no  Procedure: Left L5 dorsal ramus, S1-3 radiofrequency neurotomy Position: Prone Start Time: 10:10am  End Time: 1:28am  Fluoro Time: 47s  RN/CMA Edd Reppert, CMA Haddon Fyfe, CMA    Time 9:55am 10:36am    BP 132/68 157/73    Pulse 85 90    Respirations 14 14    O2 Sat 94 96    S/S 6 6    Pain Level 4/10 1/10     D/C home with friend, [name] & O X 3, D/C instructions reviewed, and sits independently.

## 2019-03-04 NOTE — Progress Notes (Signed)
LEFT Sacroiliac radio frequency ablation under fluoroscopic guidance  This consists of L5 dorsal ramus radio frequency ablation plus neuro lysis of S1-S2 -S3 lateral branches  Indication is sacroiliac pain which has improved temporarily onby at least 50% following sacroiliac intra-articular injection and L5 , S1,2,3 Lateral branch blocks under fluoroscopic guidance. Pain interferes with self-care and mobility and has failed to respond to conservative measures.  Informed consent was obtained after discussing risks and benefits of the procedure with the patient these include bleeding bruising and infection temporary or permanent paralysis. The patient elects to proceed and has given written consent.  Patient placed prone on fluoroscopy table. Area marked and prepped with Betadine. Fluoroscopic images utilized to guide needle. 25-gauge 1.5 inch needle was used to anesthetize 4 injection points with 2 cc of 1% lidocaine each. Then a 18-gauge 15 cm RF needle with a 10 mm curved active tip was inserted under fluoroscopic guidance first targeting the S1 SA P./sacral ala junction, bone contact made and confirmed with lateral imaging.  motor stimulation at 2 Hz confirm proper needle location followed by injection of one cc of a solution containing   2% lidocaine MPF. Radio frequency 80C for 80 seconds was performed. Then the inferolateral aspect of the S1, S2 and lateral aspect of S3 sacral foramina were targeted. Bone contact made.  motor stim at 2 Hz confirm proper needle location. One ML of the /lidocaine solution was injected into each of 3 sites and radio frequency ablation 80C for 90 seconds was performed. Patient tolerated procedure well. Post procedure instructions given

## 2019-03-05 ENCOUNTER — Telehealth: Payer: Self-pay | Admitting: Family Medicine

## 2019-03-05 NOTE — Telephone Encounter (Signed)
Patient called and stated that [facility] have yet to receive the medical clearance form for her procedure. Please fu at your earliest convenience.   Fax #: [phone] or [phone]

## 2019-03-05 NOTE — Telephone Encounter (Signed)
Order has been refaxed.  

## 2019-03-08 ENCOUNTER — Encounter: Payer: Self-pay | Admitting: Family Medicine

## 2019-03-08 ENCOUNTER — Other Ambulatory Visit: Payer: Self-pay | Admitting: Family Medicine

## 2019-03-08 DIAGNOSIS — E039 Hypothyroidism, unspecified: Secondary | ICD-10-CM

## 2019-03-15 ENCOUNTER — Other Ambulatory Visit: Payer: Self-pay

## 2019-03-15 ENCOUNTER — Ambulatory Visit: Payer: Medicaid Other | Attending: Family Medicine

## 2019-03-15 DIAGNOSIS — E039 Hypothyroidism, unspecified: Secondary | ICD-10-CM

## 2019-03-16 LAB — TSH: TSH: 0.079 u[IU]/mL — ABNORMAL LOW (ref 0.450–4.500)

## 2019-03-16 LAB — T4, FREE: Free T4: 1.51 ng/dL (ref 0.82–1.77)

## 2019-03-17 ENCOUNTER — Encounter: Payer: Self-pay | Admitting: Family Medicine

## 2019-03-17 ENCOUNTER — Other Ambulatory Visit: Payer: Self-pay | Admitting: Family Medicine

## 2019-03-17 DIAGNOSIS — E039 Hypothyroidism, unspecified: Secondary | ICD-10-CM

## 2019-03-17 MED ORDER — LEVOTHYROXINE SODIUM 137 MCG PO TABS: 137.0000 ug | ORAL_TABLET | Freq: Every day | ORAL | 3 refills | Status: DC

## 2019-03-17 NOTE — Telephone Encounter (Signed)
Message from patient

## 2019-04-08 ENCOUNTER — Ambulatory Visit: Payer: Medicaid Other | Admitting: Allergy and Immunology

## 2019-04-12 ENCOUNTER — Encounter: Payer: Self-pay | Admitting: Family Medicine

## 2019-04-12 ENCOUNTER — Encounter: Payer: Self-pay | Admitting: Allergy and Immunology

## 2019-04-12 ENCOUNTER — Ambulatory Visit (INDEPENDENT_AMBULATORY_CARE_PROVIDER_SITE_OTHER): Payer: Medicaid Other | Admitting: Allergy and Immunology

## 2019-04-12 ENCOUNTER — Other Ambulatory Visit: Payer: Self-pay

## 2019-04-12 VITALS — BP 132/60 | HR 95 | Temp 98.2°F | Resp 18

## 2019-04-12 DIAGNOSIS — E662 Morbid (severe) obesity with alveolar hypoventilation: Secondary | ICD-10-CM

## 2019-04-12 DIAGNOSIS — J324 Chronic pansinusitis: Secondary | ICD-10-CM

## 2019-04-12 DIAGNOSIS — J4550 Severe persistent asthma, uncomplicated: Secondary | ICD-10-CM

## 2019-04-12 DIAGNOSIS — J339 Nasal polyp, unspecified: Secondary | ICD-10-CM | POA: Diagnosis not present

## 2019-04-12 NOTE — Patient Instructions (Addendum)
  1.  Continue to perform allergen avoidance measures -wash animals & out of bedroom, HEPA filter  2.  Continue to treat and inflammation:   A.  Symbicort 160 - 2 inhalations 1-2 times per day  B.  Flonase - 1 spray each nostril 1-2 times per day  C.  Montelukast 10 mg - 1 tablet 1 time per day  D.  Dupilumab injections every 2 weeks  3.  Continue to treat reflux:   A.  Slowly consolidate caffeine and chocolate consumption  B.  Omeprazole 40 mg - 1 tablet 1 time per day  4.  Continue oxygen 2 L nasal cannula during exertion if needed  5. Start immunotherapy   6. Return to clinic in 12 weeks or earlier if problem

## 2019-04-12 NOTE — Progress Notes (Signed)
[place] [facility] [place] [facility] [place]   Follow-up Note  Referring Provider: Charlott Rakes, MD Primary Provider: Charlott Rakes, MD Date of Office Visit: 04/12/2019  Subjective:   Janet [name] (DOB: 04/12/1970) is a 49 y.o. female who returns to the [facility] and [place] on 04/12/2019 in re-evaluation of the following:  HPI: Kyndal returns to this clinic in evaluation of severe asthma, allergic rhinitis, chronic sinusitis, suspected obesity/hypoventilation syndrome, exercise-induced hypoxemia, and LPR. Her last visit to this clinic was 11 February 2019.  Her asthma is really doing quite well and she does not need to use a short acting bronchodilator and has very little issues with lung symptoms.  She no longer uses oxygen when exerting herself.  She continues to use a collection of anti-inflammatory agents for asthma including the use of dupilumab injections.  She underwent sinus surgery 2 weeks ago with Dr. Gaylyn Cheers, ENT, and feels pretty good about that surgery.  Her nose is open at this point.  She had her first 2-week postop cleaning today.  She has not been using her nasal steroid at this point but is just using saline.  Her reflux has not been causing her any problem.  She still consumes 1 coffee per day.  Allergies as of 04/12/2019      Reactions   Ciprofloxacin Shortness Of Breath   Shrimp [shellfish Allergy] Other (See Comments)   positive allergy test      Medication List      albuterol (2.5 MG/3ML) 0.083% nebulizer solution Commonly known as: PROVENTIL Take 3 mLs (2.5 mg total) by nebulization every 6 (six) hours as needed for wheezing or shortness of breath.   albuterol 108 (90 Base) MCG/ACT inhaler Commonly known as: VENTOLIN HFA Inhale 1-2 puffs into the lungs every 6 (six) hours as needed for wheezing or shortness of breath.   atorvastatin 20 MG tablet Commonly known as: LIPITOR Take 1 tablet (20 mg total) by mouth daily.     budesonide-formoterol 160-4.5 MCG/ACT inhaler Commonly known as: Symbicort Inhale 2 puffs into the lungs 2 (two) times daily.   cholecalciferol 1000 units tablet Commonly known as: VITAMIN D Take 1,000 Units by mouth at bedtime.   DULoxetine 60 MG capsule Commonly known as: Cymbalta Take 1 capsule (60 mg total) by mouth daily.   EQ Allergy Relief (Cetirizine) 10 MG tablet Generic drug: cetirizine Take 1 tablet by mouth once daily   fluticasone 50 MCG/ACT nasal spray Commonly known as: FLONASE Place 2 sprays into both nostrils daily.   levothyroxine 137 MCG tablet Commonly known as: SYNTHROID Take 1 tablet (137 mcg total) by mouth daily before breakfast.   losartan 50 MG tablet Commonly known as: COZAAR TAKE 1 TABLET BY MOUTH ONCE DAILY. REPLACES LISINOPRIL.   metFORMIN 500 MG tablet Commonly known as: GLUCOPHAGE Take 2 tablets (1,000 mg total) by mouth 2 (two) times daily with a meal.   methocarbamol 500 MG tablet Commonly known as: ROBAXIN Take 2 tablets (1,000 mg total) by mouth 2 (two) times daily.   montelukast 10 MG tablet Commonly known as: SINGULAIR Take 1 tablet (10 mg total) by mouth at bedtime.   multivitamin tablet Take 1 tablet by mouth daily.   omeprazole 40 MG capsule Commonly known as: PRILOSEC Take 1 capsule (40 mg total) by mouth daily.   OXcarbazepine 150 MG tablet Commonly known as: TRILEPTAL Take 1 tablet (150 mg total) by mouth 2 (two) times daily.   pregabalin 75 MG capsule Commonly  known as: LYRICA Take 1 capsule (75 mg total) by mouth 2 (two) times daily.   triamcinolone cream 0.1 % Commonly known as: KENALOG Apply 1 application topically 2 (two) times daily.   TRUEplus Pen Needles 32G X 4 MM Misc Generic drug: Insulin Pen Needle Use to inject Victoza daily.   Victoza 18 MG/3ML Sopn Generic drug: liraglutide Inject subcutaneously daily 0.6 mg for 1 week then 1.2 mg for 1 week then 1.8 mg thereafter       Past Medical  History:  Diagnosis Date  . Anemia   . Angio-edema   . Anxiety   . Asthma   . Depression   . Hypothyroidism   . Pre-diabetes   . Right foot drop   . Sleep apnea   . Thyroid disease   . Urticaria   . Uterine cancer [facility])     Past Surgical History:  Procedure Laterality Date  . ABDOMINAL HYSTERECTOMY    . CARPAL TUNNEL RELEASE    . CESAREAN SECTION    . CHOLECYSTECTOMY    . cyst removal     uterus  . DILATION AND CURETTAGE OF UTERUS     still birth  . DILATION AND CURETTAGE OF UTERUS N/A 06/11/2016   Procedure: DILATATION AND CURETTAGE;  Surgeon: Emily Filbert, MD;  Location: [facility] [facility];  Service: Gynecology;  Laterality: N/A;  . ROBOTIC ASSISTED TOTAL HYSTERECTOMY WITH BILATERAL SALPINGO OOPHERECTOMY Bilateral 07/09/2016   Procedure: XI ROBOTIC ASSISTED TOTAL HYSTERECTOMY WITH BILATERAL SALPINGO OOPHORECTOMY FOR UTERUS GREATER THAN 250 GRAMS, LYSIS OF ADHESIONS;  Surgeon: Everitt Amber, MD;  Location: [facility];  Service: Gynecology;  Laterality: Bilateral;  . SINOSCOPY    . SINUS EXPLORATION    . WISDOM TOOTH EXTRACTION      Review of systems negative except as noted in HPI / PMHx or noted below:  Review of Systems  Constitutional: Negative.   HENT: Negative.   Eyes: Negative.   Respiratory: Negative.   Cardiovascular: Negative.   Gastrointestinal: Negative.   Genitourinary: Negative.   Musculoskeletal: Negative.   Skin: Negative.   Neurological: Negative.   Endo/Heme/Allergies: Negative.   Psychiatric/Behavioral: Negative.      Objective:   Vitals:   04/12/19 1649  BP: 132/60  Pulse: 95  Resp: 18  Temp: 98.2 F (36.8 C)  SpO2: 96%          Physical Exam Constitutional:      Appearance: She is not diaphoretic.  HENT:     Head: Normocephalic.     Right Ear: Tympanic membrane, ear canal and external ear normal.     Left Ear: Tympanic membrane, ear canal and external ear normal.     Nose: Nose normal. No mucosal edema or rhinorrhea.     Mouth/Throat:      Pharynx: Uvula midline. No oropharyngeal exudate.  Eyes:     Conjunctiva/sclera: Conjunctivae normal.  Neck:     Thyroid: No thyromegaly.     Trachea: Trachea normal. No tracheal tenderness or tracheal deviation.  Cardiovascular:     Rate and Rhythm: Normal rate and regular rhythm.     Heart sounds: S1 normal and S2 normal. Murmur (Systolic) present.  Pulmonary:     Effort: No respiratory distress.     Breath sounds: Normal breath sounds. No stridor. No wheezing or rales.  Lymphadenopathy:     Head:     Right side of head: No tonsillar adenopathy.     Left side of head: No tonsillar adenopathy.  Cervical: No cervical adenopathy.  Skin:    Findings: No erythema or rash.     Nails: There is no clubbing.  Neurological:     Mental Status: She is alert.     Diagnostics:    Spirometry was performed and demonstrated an FEV1 of 3.31 at 107 % of predicted.    Assessment and Plan:   1. Asthma, severe persistent, well-controlled   2. Obesity hypoventilation syndrome ([place])   3. Chronic pansinusitis   4. Nasal polyposis     1.  Continue to perform allergen avoidance measures -wash animals & out of bedroom, HEPA filter  2.  Continue to treat and inflammation:   A.  Symbicort 160 - 2 inhalations 1-2 times per day  B.  Flonase - 1 spray each nostril 1-2 times per day  C.  Montelukast 10 mg - 1 tablet 1 time per day  D.  Dupilumab injections every 2 weeks  3.  Continue to treat reflux:   A.  Slowly consolidate caffeine and chocolate consumption  B.  Omeprazole 40 mg - 1 tablet 1 time per day  4.  Continue oxygen 2 L nasal cannula during exertion if needed  5. Start immunotherapy   6. Return to clinic in 12 weeks or earlier if problem  Tamber is really doing very well from a respiratory standpoint and I think there is an opportunity to consolidate some of her medical therapy.  Her benefit from dupilumab has been quite significant and I think we can lower her Symbicort to  just 1 time per day and when she restarts her Flonase we will just have her use this medication 1 time per day as well.  We will start her on immunotherapy as a long-term approach to her atopic disease.  She will continue to use therapy as noted above.  I did have a discussion with her today about weight and touched on the subject of a surgical approach to her obesity issue.  I will see her back in this clinic in 12 weeks or earlier if there is a problem.  Allena Katz, MD Allergy / Immunology [place]

## 2019-04-13 ENCOUNTER — Encounter: Payer: Self-pay | Admitting: Allergy and Immunology

## 2019-04-14 ENCOUNTER — Other Ambulatory Visit: Payer: Self-pay | Admitting: Allergy and Immunology

## 2019-04-14 DIAGNOSIS — J3089 Other allergic rhinitis: Secondary | ICD-10-CM

## 2019-04-14 NOTE — Progress Notes (Signed)
VIALS EXP 04-13-20

## 2019-04-14 NOTE — Progress Notes (Signed)
I have reviewed and agreed above plan. 

## 2019-04-19 ENCOUNTER — Other Ambulatory Visit: Payer: Self-pay | Admitting: Family Medicine

## 2019-04-19 ENCOUNTER — Encounter: Payer: Self-pay | Admitting: Family Medicine

## 2019-04-19 MED ORDER — GLIPIZIDE 5 MG PO TABS: 5.0000 mg | ORAL_TABLET | Freq: Two times a day (BID) | ORAL | 3 refills | Status: DC

## 2019-04-28 ENCOUNTER — Other Ambulatory Visit: Payer: Self-pay

## 2019-04-28 ENCOUNTER — Ambulatory Visit: Payer: Medicaid Other | Attending: Family Medicine | Admitting: Family Medicine

## 2019-04-28 ENCOUNTER — Encounter: Payer: Self-pay | Admitting: Family Medicine

## 2019-04-28 VITALS — BP 128/73 | HR 84 | Ht 67.0 in | Wt >= 6400 oz

## 2019-04-28 DIAGNOSIS — E1149 Type 2 diabetes mellitus with other diabetic neurological complication: Secondary | ICD-10-CM

## 2019-04-28 DIAGNOSIS — F330 Major depressive disorder, recurrent, mild: Secondary | ICD-10-CM

## 2019-04-28 DIAGNOSIS — R600 Localized edema: Secondary | ICD-10-CM

## 2019-04-28 DIAGNOSIS — Z1231 Encounter for screening mammogram for malignant neoplasm of breast: Secondary | ICD-10-CM | POA: Diagnosis not present

## 2019-04-28 LAB — GLUCOSE, POCT (MANUAL RESULT ENTRY): POC Glucose: 128 mg/dl — AB (ref 70–99)

## 2019-04-28 MED ORDER — GLIPIZIDE 10 MG PO TABS: 10.0000 mg | ORAL_TABLET | Freq: Two times a day (BID) | ORAL | 6 refills | Status: DC

## 2019-04-28 NOTE — Progress Notes (Signed)
Subjective:  Patient ID: Janet [name], female    DOB: [birth year]  Age: 49 y.o. MRN: [id number]  CC: Diabetes   HPI Janet [name] is a49 year old female with a history of type 2 diabetes mellitus (A1c of6.7), Diabetic neuropathy, hypothyroidism, asthma, Seasonal allergies, endometrial adenocarcinoma (s/pTotal abdominal hysterectomy and bilateral salpingo-oophorectomy in 06/2016, adjuvant treatment due to low risk for recurrence), morbid obesity who presents todaydue to ongoing hyperglycemia. She had sent me a message due to hyperglycemia at home for which I had commenced glipizide to be used in addition to her Metformin and Victoza.  She brings in her blood sugar logs today and fasting sugars range 200-300 but in the last 2 days it has improved to 148-159. She endorses emotional eating.   She moved away and now does not live near family. She feels alone with her son.  Her husband passed away 3 years ago.  She acknowledges being depressed at this point and is on Cymbalta.  Just joined a church family but does not have any close friends there.  Denies suicidal ideations or intents. Past Medical History:  Diagnosis Date  . Anemia   . Angio-edema   . Anxiety   . Asthma   . Depression   . Hypothyroidism   . Pre-diabetes   . Right foot drop   . Sleep apnea   . Thyroid disease   . Urticaria   . Uterine cancer [facility])     Past Surgical History:  Procedure Laterality Date  . ABDOMINAL HYSTERECTOMY    . CARPAL TUNNEL RELEASE    . CESAREAN SECTION    . CHOLECYSTECTOMY    . cyst removal     uterus  . DILATION AND CURETTAGE OF UTERUS     still birth  . DILATION AND CURETTAGE OF UTERUS N/A 06/11/2016   Procedure: DILATATION AND CURETTAGE;  Surgeon: Emily Filbert, MD;  Location: [facility];  Service: Gynecology;  Laterality: N/A;  . ROBOTIC ASSISTED TOTAL HYSTERECTOMY WITH BILATERAL SALPINGO OOPHERECTOMY Bilateral 07/09/2016   Procedure: XI ROBOTIC ASSISTED TOTAL HYSTERECTOMY WITH  BILATERAL SALPINGO OOPHORECTOMY FOR UTERUS GREATER THAN 250 GRAMS, LYSIS OF ADHESIONS;  Surgeon: Everitt Amber, MD;  Location: [facility];  Service: Gynecology;  Laterality: Bilateral;  . SINOSCOPY    . SINUS EXPLORATION    . WISDOM TOOTH EXTRACTION      Family History  Problem Relation Age of Onset  . Heart attack Mother   . Allergic rhinitis Mother   . Suicidality Father     Allergies  Allergen Reactions  . Ciprofloxacin Shortness Of Breath  . Shrimp [Shellfish Allergy] Other (See Comments)    positive allergy test    Outpatient Medications Prior to Visit  Medication Sig Dispense Refill  . albuterol (PROVENTIL) (2.5 MG/3ML) 0.083% nebulizer solution Take 3 mLs (2.5 mg total) by nebulization every 6 (six) hours as needed for wheezing or shortness of breath. 75 mL 3  . albuterol (VENTOLIN HFA) 108 (90 Base) MCG/ACT inhaler Inhale 1-2 puffs into the lungs every 6 (six) hours as needed for wheezing or shortness of breath. 54 g 3  . atorvastatin (LIPITOR) 20 MG tablet Take 1 tablet (20 mg total) by mouth daily. 90 tablet 1  . budesonide-formoterol (SYMBICORT) 160-4.5 MCG/ACT inhaler Inhale 2 puffs into the lungs 2 (two) times daily. 1 Inhaler 5  . cholecalciferol (VITAMIN D) 1000 units tablet Take 1,000 Units by mouth at bedtime.    . DULoxetine (CYMBALTA) 60 MG capsule Take 1 capsule (60 mg total)  by mouth daily. 90 capsule 4  . EQ ALLERGY RELIEF, CETIRIZINE, 10 MG tablet Take 1 tablet by mouth once daily 90 tablet 0  . fluticasone (FLONASE) 50 MCG/ACT nasal spray Place 2 sprays into both nostrils daily. 16 g 6  . glipiZIDE (GLUCOTROL) 5 MG tablet Take 1 tablet (5 mg total) by mouth 2 (two) times daily before a meal. 60 tablet 3  . Insulin Pen Needle (TRUEPLUS PEN NEEDLES) 32G X 4 MM MISC Use to inject Victoza daily. 100 each 11  . levothyroxine (SYNTHROID) 137 MCG tablet Take 1 tablet (137 mcg total) by mouth daily before breakfast. 30 tablet 3  . liraglutide (VICTOZA) 18 MG/3ML SOPN Inject  subcutaneously daily 0.6 mg for 1 week then 1.2 mg for 1 week then 1.8 mg thereafter 30 mL 6  . losartan (COZAAR) 50 MG tablet TAKE 1 TABLET BY MOUTH ONCE DAILY. REPLACES LISINOPRIL. 90 tablet 1  . metFORMIN (GLUCOPHAGE) 500 MG tablet Take 2 tablets (1,000 mg total) by mouth 2 (two) times daily with a meal. 360 tablet 1  . montelukast (SINGULAIR) 10 MG tablet Take 1 tablet (10 mg total) by mouth at bedtime. 90 tablet 1  . Multiple Vitamin (MULTIVITAMIN) tablet Take 1 tablet by mouth daily.    Marland Kitchen omeprazole (PRILOSEC) 40 MG capsule Take 1 capsule (40 mg total) by mouth daily. 30 capsule 5  . OXcarbazepine (TRILEPTAL) 150 MG tablet Take 1 tablet (150 mg total) by mouth 2 (two) times daily. 180 tablet 1  . pregabalin (LYRICA) 75 MG capsule Take 1 capsule (75 mg total) by mouth 2 (two) times daily. 60 capsule 5  . triamcinolone cream (KENALOG) 0.1 % Apply 1 application topically 2 (two) times daily. 80 g 0  . methocarbamol (ROBAXIN) 500 MG tablet Take 2 tablets (1,000 mg total) by mouth 2 (two) times daily. (Patient not taking: Reported on 04/28/2019) 120 tablet 1   No facility-administered medications prior to visit.     ROS Review of Systems  Constitutional: Negative for activity change, appetite change and fatigue.  HENT: Negative for congestion, sinus pressure and sore throat.   Eyes: Negative for visual disturbance.  Respiratory: Negative for cough, chest tightness, shortness of breath and wheezing.   Cardiovascular: Positive for leg swelling. Negative for chest pain and palpitations.  Gastrointestinal: Negative for abdominal distention, abdominal pain and constipation.  Endocrine: Negative for polydipsia.  Genitourinary: Negative for dysuria and frequency.  Musculoskeletal: Negative for arthralgias and back pain.  Skin: Negative for rash.  Neurological: Negative for tremors, light-headedness and numbness.  Hematological: Does not bruise/bleed easily.  Psychiatric/Behavioral: Positive for  dysphoric mood. Negative for agitation and behavioral problems.    Objective:  BP (!) 143/74   Pulse 84   Ht 5\' 7"  (1.702 m)   Wt (!) 404 lb 3.2 oz (183.3 kg)   LMP  (LMP Unknown) Comment: irregular periods previously, bleeding started in january  SpO2 99%   BMI 63.31 kg/m   BP/Weight 04/28/2019 04/12/2019 [id number]  Systolic BP [id number] [id number] [id number]  Diastolic BP 74 60 68  Wt. (Lbs) 404.2 - 399  BMI 63.31 - 62.49      Physical Exam Constitutional:      Appearance: She is well-developed. She is obese.  Neck:     Vascular: No JVD.  Cardiovascular:     Rate and Rhythm: Normal rate.     Heart sounds: Murmur present.  Pulmonary:     Effort: Pulmonary effort is normal.     Breath  sounds: Normal breath sounds. No wheezing or rales.  Chest:     Chest wall: No tenderness.  Abdominal:     General: Bowel sounds are normal. There is no distension.     Palpations: Abdomen is soft. There is no mass.     Tenderness: There is no abdominal tenderness.  Musculoskeletal:        General: Normal range of motion.     Right lower leg: Edema (1+) present.     Left lower leg: Edema (1+) present.  Neurological:     Mental Status: She is alert and oriented to person, place, and time.  Psychiatric:     Comments: Dysphoric mood     CMP Latest Ref Rng & Units 11/09/2018 06/16/2018 12/16/2017  Glucose 65 - 99 mg/dL 131(H) 330(H) 127(H)  BUN 6 - 24 mg/dL 6 9 8   Creatinine 0.57 - 1.00 mg/dL 0.73 0.60 0.74  Sodium 134 - 144 mmol/L 143 138 143  Potassium 3.5 - 5.2 mmol/L 3.8 4.5 4.0  Chloride 96 - 106 mmol/L 104 98 108  CO2 20 - 29 mmol/L 23 24 28   Calcium 8.7 - 10.2 mg/dL 9.8 9.4 9.0  Total Protein 6.0 - 8.5 g/dL 6.6 6.7 -  Total Bilirubin 0.0 - 1.2 mg/dL 1.6(H) 1.3(H) -  Alkaline Phos 39 - 117 IU/L 131(H) 152(H) -  AST 0 - 40 IU/L 41(H) 33 -  ALT 0 - 32 IU/L 25 23 -    Lipid Panel     Component Value Date/Time   CHOL 147 11/09/2018 1016   TRIG 93 11/09/2018 1016   HDL 71 11/09/2018 1016    CHOLHDL 2.1 11/09/2018 1016   CHOLHDL 2.1 12/26/2015 0929   VLDL 15 12/26/2015 0929   LDLCALC 59 11/09/2018 1016    CBC    Component Value Date/Time   WBC 5.7 12/08/2018 1507   WBC 3.0 (L) 09/26/2017 1925   RBC 4.85 12/08/2018 1507   RBC 4.79 09/26/2017 1925   HGB 15.0 12/08/2018 1507   HCT 43.4 12/08/2018 1507   PLT 84 (LL) 12/08/2018 1507   MCV 90 12/08/2018 1507   MCH 30.9 12/08/2018 1507   MCH 24.0 (L) 09/26/2017 1925   MCHC 34.6 12/08/2018 1507   MCHC 30.6 09/26/2017 1925   RDW 13.7 12/08/2018 1507   LYMPHSABS 1.0 12/08/2018 1507   MONOABS 0.2 07/02/2016 1035   EOSABS 0.1 12/08/2018 1507   BASOSABS 0.0 12/08/2018 1507    Lab Results  Component Value Date   HGBA1C 6.7 03/03/2019    Assessment & Plan:   1. Type 2 diabetes mellitus with other neurologic complication, without long-term current use of insulin (HCC) Previously controlled with A1c of 6.7; goal of less than 7 Recent blood sugars reveal poor control Increase glipizide from 5 milligrams twice daily to 10 mg twice daily. Educated on symptoms of hypoglycemia and she knows to inform the clinic and to decrease glipizide dose back to 5 mg twice daily - POCT glucose (manual entry) - glipiZIDE (GLUCOTROL) 10 MG tablet; Take 1 tablet (10 mg total) by mouth 2 (two) times daily before a meal.  Dispense: 60 tablet; Refill: 6  2. Encounter for screening mammogram for malignant neoplasm of breast - MM 3D SCREEN BREAST BILATERAL; Future  3. Morbid obesity ([place]) Recent emotional eating also contributing She is currently being followed by bariatric surgery with plans for bariatric surgery down the road Advised reducing portion sizes, increasing physical activity Surgical management of obesity will be most beneficial  for her  4. Pedal edema Combination of dependent edema possibly daily, Advised to continue elevating feet, reducing sodium intake  5. Mild episode of recurrent major depressive disorder  ([place]) Uncontrolled Currently on Cymbalta Underlying bereavement also contributory We will have her see the LCSW for psychotherapy   Labs at next visit   Return for Depression with [facility] next available; 2 months PCP chronic disease management.      Charlott Rakes, MD, FAAFP. [facility] and [place], [place]   04/28/2019, 11:42 AM

## 2019-05-03 ENCOUNTER — Ambulatory Visit (INDEPENDENT_AMBULATORY_CARE_PROVIDER_SITE_OTHER): Payer: Medicaid Other | Admitting: *Deleted

## 2019-05-03 ENCOUNTER — Other Ambulatory Visit: Payer: Self-pay

## 2019-05-03 DIAGNOSIS — J309 Allergic rhinitis, unspecified: Secondary | ICD-10-CM

## 2019-05-03 MED ORDER — EPINEPHRINE 0.3 MG/0.3ML IJ SOAJ: INTRAMUSCULAR | 3 refills | Status: AC

## 2019-05-03 NOTE — Progress Notes (Signed)
Immunotherapy   Patient Details  Name: Janet [name] MRN: [id number] Date of Birth: [birth year]  05/03/2019  [name] - DUSTMITE - CAT/DOG - EXP: 04/13/2020 Following schedule: B  Frequency: 1-2 TIMES WEEKLY Epi-Pen: YES Consent signed and patient instructions given.   Glendell Docker 05/03/2019, 9:38 AM

## 2019-05-05 ENCOUNTER — Ambulatory Visit: Payer: Medicaid Other | Attending: Family Medicine | Admitting: Licensed Clinical Social Worker

## 2019-05-05 ENCOUNTER — Other Ambulatory Visit: Payer: Self-pay

## 2019-05-05 DIAGNOSIS — F331 Major depressive disorder, recurrent, moderate: Secondary | ICD-10-CM

## 2019-05-17 ENCOUNTER — Ambulatory Visit: Payer: Medicaid Other

## 2019-05-19 ENCOUNTER — Encounter: Payer: Self-pay | Admitting: Family Medicine

## 2019-05-20 ENCOUNTER — Institutional Professional Consult (permissible substitution): Payer: Medicaid Other | Admitting: Licensed Clinical Social Worker

## 2019-05-23 ENCOUNTER — Encounter: Payer: Self-pay | Admitting: Family Medicine

## 2019-05-24 ENCOUNTER — Encounter: Payer: Self-pay | Admitting: Family Medicine

## 2019-05-24 ENCOUNTER — Ambulatory Visit: Payer: Medicaid Other

## 2019-05-24 ENCOUNTER — Institutional Professional Consult (permissible substitution): Payer: Medicaid Other | Admitting: Licensed Clinical Social Worker

## 2019-05-27 ENCOUNTER — Telehealth: Payer: Self-pay | Admitting: Family Medicine

## 2019-05-27 ENCOUNTER — Encounter: Payer: Self-pay | Admitting: Family Medicine

## 2019-05-27 NOTE — Telephone Encounter (Signed)
Patient called in and stated that she believes she has a UTI. Patient stated she has tried OTC medications and has been drinking cranberry juice, for the past 3 days, and it is not working. Please follow up at your earliest convenience.  [facility] in [place].

## 2019-05-27 NOTE — Telephone Encounter (Signed)
Patient has an appointment set up with the walk-in provider on 06/02/2019 to discuss this matter.

## 2019-05-27 NOTE — Progress Notes (Signed)
Integrated Behavioral Health Visit via Telemedicine (Telephone)  05/05/2019 [name] [id number]   Session Start time: 10:20 AM  Session End time: 10:55 AM Total time: 35   Referring Provider: Dr. Margarita Rana Type of Visit: Telephonic Patient location: Home [facility] Provider location: Office All persons participating in visit: Pt and LCSW  Confirmed patient's address: Yes  Confirmed patient's phone number: Yes  Any changes to demographics: No   Confirmed patient's insurance: Yes  Any changes to patient's insurance: No   Discussed confidentiality: Yes    The following statements were read to the patient and/or legal guardian that are established with the [facility] Provider.  "The purpose of this phone visit is to provide behavioral health care while limiting exposure to the coronavirus (COVID19).  There is a possibility of technology failure and discussed alternative modes of communication if that failure occurs."  "By engaging in this telephone visit, you consent to the provision of healthcare.  Additionally, you authorize for your insurance to be billed for the services provided during this telephone visit."   Patient and/or legal guardian consented to telephone visit: Yes   PRESENTING CONCERNS: Patient and/or family reports the following symptoms/concerns: Pt reports hx of depression and anxiety. Increase in symptoms triggered by grief of spouse (2018) and mother (2009)  Duration of problem: Ongoing; Severity of problem: severe  STRENGTHS (Protective Factors/Coping Skills): Pt has good insight Pt has limited support system (brother)  GOALS ADDRESSED: Patient will: 1.  Reduce symptoms of: anxiety and depression  2.  Increase knowledge and/or ability of: coping skills and healthy habits  3.  Demonstrate ability to: Increase healthy adjustment to current life circumstances, Increase adequate support systems for patient/family and Begin healthy grieving over  loss  INTERVENTIONS: Interventions utilized:  Solution-Focused Strategies, Supportive Counseling, Psychoeducation and/or Health Education and Link to [employer] Standardized Assessments completed: Not Needed  ASSESSMENT: Patient currently experiencing increase in depression and anxiety triggered by grief and stress caring for minor child. Pt reports worry about different things, increase in irritability, and difficulty obtaining quality sleep. Pt receives disability.   Patient may benefit from medication management and therapy. Validation and encouragement provided. Healthy coping skills were identified and pt shared she is open in therapy, stating, "I want to get on with life"  PLAN: 1. Follow up with behavioral health clinician on : 05/20/19 2. Behavioral recommendations: Utilize strategies discussed 3. Referral(s): [facility] (In Clinic)  Rebekah Chesterfield,  05/27/19 2:51 PM

## 2019-05-28 ENCOUNTER — Other Ambulatory Visit: Payer: Self-pay | Admitting: Family Medicine

## 2019-05-28 MED ORDER — CEPHALEXIN 500 MG PO CAPS: 500.0000 mg | ORAL_CAPSULE | Freq: Two times a day (BID) | ORAL | 0 refills | Status: DC

## 2019-05-31 ENCOUNTER — Ambulatory Visit: Payer: Medicaid Other | Admitting: Family Medicine

## 2019-06-01 ENCOUNTER — Ambulatory Visit: Payer: Medicaid Other

## 2019-06-02 ENCOUNTER — Ambulatory Visit: Payer: Medicaid Other

## 2019-06-02 ENCOUNTER — Other Ambulatory Visit: Payer: Self-pay

## 2019-06-02 ENCOUNTER — Ambulatory Visit: Payer: Medicaid Other | Admitting: Licensed Clinical Social Worker

## 2019-06-02 DIAGNOSIS — E1149 Type 2 diabetes mellitus with other diabetic neurological complication: Secondary | ICD-10-CM

## 2019-06-06 ENCOUNTER — Other Ambulatory Visit: Payer: Self-pay | Admitting: Allergy and Immunology

## 2019-06-09 ENCOUNTER — Encounter: Payer: Self-pay | Admitting: Family Medicine

## 2019-06-10 ENCOUNTER — Other Ambulatory Visit: Payer: Self-pay | Admitting: Family Medicine

## 2019-06-10 DIAGNOSIS — R21 Rash and other nonspecific skin eruption: Secondary | ICD-10-CM

## 2019-06-10 DIAGNOSIS — L309 Dermatitis, unspecified: Secondary | ICD-10-CM

## 2019-06-10 MED ORDER — TRIAMCINOLONE ACETONIDE 0.1 % EX CREA: 1.0000 "application " | TOPICAL_CREAM | Freq: Two times a day (BID) | CUTANEOUS | 0 refills | Status: AC

## 2019-07-01 ENCOUNTER — Ambulatory Visit: Payer: Medicaid Other | Attending: Family Medicine | Admitting: Family Medicine

## 2019-07-01 ENCOUNTER — Other Ambulatory Visit: Payer: Self-pay

## 2019-07-01 ENCOUNTER — Encounter: Payer: Self-pay | Admitting: Family Medicine

## 2019-07-01 VITALS — BP 144/73 | HR 78 | Ht 67.0 in | Wt >= 6400 oz

## 2019-07-01 DIAGNOSIS — E1140 Type 2 diabetes mellitus with diabetic neuropathy, unspecified: Secondary | ICD-10-CM | POA: Insufficient documentation

## 2019-07-01 DIAGNOSIS — F419 Anxiety disorder, unspecified: Secondary | ICD-10-CM | POA: Diagnosis not present

## 2019-07-01 DIAGNOSIS — Z8542 Personal history of malignant neoplasm of other parts of uterus: Secondary | ICD-10-CM | POA: Insufficient documentation

## 2019-07-01 DIAGNOSIS — Z90710 Acquired absence of both cervix and uterus: Secondary | ICD-10-CM | POA: Diagnosis not present

## 2019-07-01 DIAGNOSIS — F330 Major depressive disorder, recurrent, mild: Secondary | ICD-10-CM

## 2019-07-01 DIAGNOSIS — J45909 Unspecified asthma, uncomplicated: Secondary | ICD-10-CM | POA: Insufficient documentation

## 2019-07-01 DIAGNOSIS — Z8249 Family history of ischemic heart disease and other diseases of the circulatory system: Secondary | ICD-10-CM | POA: Insufficient documentation

## 2019-07-01 DIAGNOSIS — E039 Hypothyroidism, unspecified: Secondary | ICD-10-CM | POA: Insufficient documentation

## 2019-07-01 DIAGNOSIS — Z794 Long term (current) use of insulin: Secondary | ICD-10-CM | POA: Diagnosis not present

## 2019-07-01 DIAGNOSIS — Z6841 Body Mass Index (BMI) 40.0 and over, adult: Secondary | ICD-10-CM | POA: Diagnosis not present

## 2019-07-01 DIAGNOSIS — Z7951 Long term (current) use of inhaled steroids: Secondary | ICD-10-CM | POA: Insufficient documentation

## 2019-07-01 DIAGNOSIS — Z881 Allergy status to other antibiotic agents status: Secondary | ICD-10-CM | POA: Insufficient documentation

## 2019-07-01 DIAGNOSIS — Z90722 Acquired absence of ovaries, bilateral: Secondary | ICD-10-CM | POA: Insufficient documentation

## 2019-07-01 DIAGNOSIS — G4730 Sleep apnea, unspecified: Secondary | ICD-10-CM | POA: Insufficient documentation

## 2019-07-01 DIAGNOSIS — J4540 Moderate persistent asthma, uncomplicated: Secondary | ICD-10-CM | POA: Insufficient documentation

## 2019-07-01 DIAGNOSIS — E1149 Type 2 diabetes mellitus with other diabetic neurological complication: Secondary | ICD-10-CM | POA: Diagnosis not present

## 2019-07-01 DIAGNOSIS — E119 Type 2 diabetes mellitus without complications: Secondary | ICD-10-CM | POA: Diagnosis present

## 2019-07-01 DIAGNOSIS — I10 Essential (primary) hypertension: Secondary | ICD-10-CM | POA: Insufficient documentation

## 2019-07-01 DIAGNOSIS — C541 Malignant neoplasm of endometrium: Secondary | ICD-10-CM

## 2019-07-01 DIAGNOSIS — Z79890 Hormone replacement therapy (postmenopausal): Secondary | ICD-10-CM | POA: Diagnosis not present

## 2019-07-01 DIAGNOSIS — K7469 Other cirrhosis of liver: Secondary | ICD-10-CM | POA: Diagnosis not present

## 2019-07-01 DIAGNOSIS — Z91013 Allergy to seafood: Secondary | ICD-10-CM | POA: Insufficient documentation

## 2019-07-01 DIAGNOSIS — J302 Other seasonal allergic rhinitis: Secondary | ICD-10-CM

## 2019-07-01 LAB — POCT GLYCOSYLATED HEMOGLOBIN (HGB A1C): HbA1c, POC (controlled diabetic range): 6.4 % (ref 0.0–7.0)

## 2019-07-01 LAB — GLUCOSE, POCT (MANUAL RESULT ENTRY): POC Glucose: 132 mg/dl — AB (ref 70–99)

## 2019-07-01 MED ORDER — ALBUTEROL SULFATE HFA 108 (90 BASE) MCG/ACT IN AERS: 1.0000 | INHALATION_SPRAY | Freq: Four times a day (QID) | RESPIRATORY_TRACT | 3 refills | Status: DC | PRN

## 2019-07-01 MED ORDER — VICTOZA 18 MG/3ML ~~LOC~~ SOPN: 1.8000 mg | PEN_INJECTOR | Freq: Every day | SUBCUTANEOUS | 6 refills | Status: DC

## 2019-07-01 MED ORDER — FLUTICASONE PROPIONATE 50 MCG/ACT NA SUSP: 2.0000 | Freq: Every day | NASAL | 6 refills | Status: DC

## 2019-07-01 MED ORDER — MONTELUKAST SODIUM 10 MG PO TABS: 10.0000 mg | ORAL_TABLET | Freq: Every day | ORAL | 1 refills | Status: DC

## 2019-07-01 MED ORDER — BUDESONIDE-FORMOTEROL FUMARATE 160-4.5 MCG/ACT IN AERO: 2.0000 | INHALATION_SPRAY | Freq: Two times a day (BID) | RESPIRATORY_TRACT | 5 refills | Status: DC

## 2019-07-01 MED ORDER — LOSARTAN POTASSIUM 50 MG PO TABS: ORAL_TABLET | ORAL | 1 refills | Status: DC

## 2019-07-01 MED ORDER — ATORVASTATIN CALCIUM 20 MG PO TABS: 20.0000 mg | ORAL_TABLET | Freq: Every day | ORAL | 1 refills | Status: DC

## 2019-07-01 MED ORDER — METFORMIN HCL 500 MG PO TABS: 1000.0000 mg | ORAL_TABLET | Freq: Two times a day (BID) | ORAL | 1 refills | Status: DC

## 2019-07-01 MED ORDER — GLIPIZIDE 10 MG PO TABS: 10.0000 mg | ORAL_TABLET | Freq: Two times a day (BID) | ORAL | 1 refills | Status: DC

## 2019-07-01 MED ORDER — OMEPRAZOLE 40 MG PO CPDR: 40.0000 mg | DELAYED_RELEASE_CAPSULE | Freq: Every day | ORAL | 1 refills | Status: DC

## 2019-07-01 NOTE — Patient Instructions (Signed)
Edema  Edema is when you have too much fluid in your body or under your skin. Edema may make your legs, feet, and ankles swell up. Swelling is also common in looser tissues, like around your eyes. This is a common condition. It gets more common as you get older. There are many possible causes of edema. Eating too much salt (sodium) and being on your feet or sitting for a long time can cause edema in your legs, feet, and ankles. Hot weather may make edema worse. Edema is usually painless. Your skin may look swollen or shiny. Follow these instructions at home:  Keep the swollen body part raised (elevated) above the level of your heart when you are sitting or lying down.  Do not sit still or stand for a long time.  Do not wear tight clothes. Do not wear garters on your upper legs.  Exercise your legs. This can help the swelling go down.  Wear elastic bandages or support stockings as told by your doctor.  Eat a low-salt (low-sodium) diet to reduce fluid as told by your doctor.  Depending on the cause of your swelling, you may need to limit how much fluid you drink (fluid restriction).  Take over-the-counter and prescription medicines only as told by your doctor. Contact a doctor if:  Treatment is not working.  You have heart, liver, or kidney disease and have symptoms of edema.  You have sudden and unexplained weight gain. Get help right away if:  You have shortness of breath or chest pain.  You cannot breathe when you lie down.  You have pain, redness, or warmth in the swollen areas.  You have heart, liver, or kidney disease and get edema all of a sudden.  You have a fever and your symptoms get worse all of a sudden. Summary  Edema is when you have too much fluid in your body or under your skin.  Edema may make your legs, feet, and ankles swell up. Swelling is also common in looser tissues, like around your eyes.  Raise (elevate) the swollen body part above the level of your  heart when you are sitting or lying down.  Follow your doctor's instructions about diet and how much fluid you can drink (fluid restriction). This information is not intended to replace advice given to you by your health care provider. Make sure you discuss any questions you have with your health care provider. Document Revised: 01/31/2017 Document Reviewed: 02/16/2016 Elsevier Patient Education  2020 [employer]  

## 2019-07-01 NOTE — Progress Notes (Signed)
Subjective:  Patient ID: Janet [name], female    DOB: 13-Jun-1970  Age: 49 y.o. MRN: [id number]  CC: Diabetes   HPI Janet [name] is a49 year old female with a history of type 2 diabetes mellitus (A1c of6.4), Diabetic neuropathy, hypothyroidism, asthma, Seasonal allergies, endometrial adenocarcinoma (s/pTotal abdominal hysterectomy and bilateral salpingo-oophorectomy in 06/2016, adjuvant treatment due to low risk for recurrence), morbid obesity who presents todayfor chronic disease management.  She saw a Dermatologist for a rash on her legs and arm which are pruritic. She is still using her Triamcinolone cream for the rash as recommended by Dermatology and she will be following up in 1 month. Bariatric Surgery appointment comes up next month.  Her Asthma is stable and so are her allergies; she is followed by asthma and allergy center and is on immunotherapy with Dupilumab. With regards to her diabetes mellitus she is compliant with her medication and has had some episodes of tremors after skipping her breakfast but noticed her sugar was around 90. Doing well on levothyroxine for hypothyroidism.  Past Medical History:  Diagnosis Date  . Anemia   . Angio-edema   . Anxiety   . Asthma   . Depression   . Hypothyroidism   . Pre-diabetes   . Right foot drop   . Sleep apnea   . Thyroid disease   . Urticaria   . Uterine cancer [facility])     Past Surgical History:  Procedure Laterality Date  . ABDOMINAL HYSTERECTOMY    . CARPAL TUNNEL RELEASE    . CESAREAN SECTION    . CHOLECYSTECTOMY    . cyst removal     uterus  . DILATION AND CURETTAGE OF UTERUS     still birth  . DILATION AND CURETTAGE OF UTERUS N/A 06/11/2016   Procedure: DILATATION AND CURETTAGE;  Surgeon: Emily Filbert, MD;  Location: [facility];  Service: Gynecology;  Laterality: N/A;  . ROBOTIC ASSISTED TOTAL HYSTERECTOMY WITH BILATERAL SALPINGO OOPHERECTOMY Bilateral 07/09/2016   Procedure: XI ROBOTIC ASSISTED TOTAL  HYSTERECTOMY WITH BILATERAL SALPINGO OOPHORECTOMY FOR UTERUS GREATER THAN 250 GRAMS, LYSIS OF ADHESIONS;  Surgeon: Everitt Amber, MD;  Location: [facility];  Service: Gynecology;  Laterality: Bilateral;  . SINOSCOPY    . SINUS EXPLORATION    . WISDOM TOOTH EXTRACTION      Family History  Problem Relation Age of Onset  . Heart attack Mother   . Allergic rhinitis Mother   . Suicidality Father     Allergies  Allergen Reactions  . Ciprofloxacin Shortness Of Breath  . Shrimp [Shellfish Allergy] Other (See Comments)    positive allergy test    Outpatient Medications Prior to Visit  Medication Sig Dispense Refill  . albuterol (PROVENTIL) (2.5 MG/3ML) 0.083% nebulizer solution Take 3 mLs (2.5 mg total) by nebulization every 6 (six) hours as needed for wheezing or shortness of breath. 75 mL 3  . albuterol (VENTOLIN HFA) 108 (90 Base) MCG/ACT inhaler Inhale 1-2 puffs into the lungs every 6 (six) hours as needed for wheezing or shortness of breath. 54 g 3  . atorvastatin (LIPITOR) 20 MG tablet Take 1 tablet (20 mg total) by mouth daily. 90 tablet 1  . budesonide-formoterol (SYMBICORT) 160-4.5 MCG/ACT inhaler Inhale 2 puffs into the lungs 2 (two) times daily. 1 Inhaler 5  . cholecalciferol (VITAMIN D) 1000 units tablet Take 1,000 Units by mouth at bedtime.    . DULoxetine (CYMBALTA) 60 MG capsule Take 1 capsule (60 mg total) by mouth daily. 90 capsule 4  .  EPINEPHrine 0.3 mg/0.3 mL IJ SOAJ injection Use as directed for life threatening allergic reactions 2 each 3  . EQ ALLERGY RELIEF, CETIRIZINE, 10 MG tablet Take 1 tablet by mouth once daily 90 tablet 0  . fluticasone (FLONASE) 50 MCG/ACT nasal spray Place 2 sprays into both nostrils daily. 16 g 6  . glipiZIDE (GLUCOTROL) 10 MG tablet Take 1 tablet (10 mg total) by mouth 2 (two) times daily before a meal. 60 tablet 6  . Insulin Pen Needle (TRUEPLUS PEN NEEDLES) 32G X 4 MM MISC Use to inject Victoza daily. 100 each 11  . levothyroxine (SYNTHROID) 137  MCG tablet Take 1 tablet (137 mcg total) by mouth daily before breakfast. 30 tablet 3  . liraglutide (VICTOZA) 18 MG/3ML SOPN Inject subcutaneously daily 0.6 mg for 1 week then 1.2 mg for 1 week then 1.8 mg thereafter 30 mL 6  . losartan (COZAAR) 50 MG tablet TAKE 1 TABLET BY MOUTH ONCE DAILY. REPLACES LISINOPRIL. 90 tablet 1  . metFORMIN (GLUCOPHAGE) 500 MG tablet Take 2 tablets (1,000 mg total) by mouth 2 (two) times daily with a meal. 360 tablet 1  . montelukast (SINGULAIR) 10 MG tablet Take 1 tablet (10 mg total) by mouth at bedtime. 90 tablet 1  . Multiple Vitamin (MULTIVITAMIN) tablet Take 1 tablet by mouth daily.    Marland Kitchen omeprazole (PRILOSEC) 40 MG capsule Take 1 capsule by mouth once daily 30 capsule 0  . OXcarbazepine (TRILEPTAL) 150 MG tablet Take 1 tablet (150 mg total) by mouth 2 (two) times daily. 180 tablet 1  . pregabalin (LYRICA) 75 MG capsule Take 1 capsule (75 mg total) by mouth 2 (two) times daily. 60 capsule 5  . triamcinolone cream (KENALOG) 0.1 % Apply 1 application topically 2 (two) times daily. 80 g 0  . cephALEXin (KEFLEX) 500 MG capsule Take 1 capsule (500 mg total) by mouth 2 (two) times daily. 6 capsule 0  . methocarbamol (ROBAXIN) 500 MG tablet Take 2 tablets (1,000 mg total) by mouth 2 (two) times daily. (Patient not taking: Reported on 04/28/2019) 120 tablet 1   No facility-administered medications prior to visit.     ROS Review of Systems  Constitutional: Negative for activity change, appetite change and fatigue.  HENT: Negative for congestion, sinus pressure and sore throat.   Eyes: Negative for visual disturbance.  Respiratory: Negative for cough, chest tightness, shortness of breath and wheezing.   Cardiovascular: Negative for chest pain and palpitations.  Gastrointestinal: Negative for abdominal distention, abdominal pain and constipation.  Endocrine: Negative for polydipsia.  Genitourinary: Negative for dysuria and frequency.  Musculoskeletal: Negative for  arthralgias and back pain.  Skin: Positive for rash.  Neurological: Negative for tremors, light-headedness and numbness.  Hematological: Does not bruise/bleed easily.  Psychiatric/Behavioral: Negative for agitation and behavioral problems.    Objective:  BP (!) 144/73   Pulse 78   Ht _0  (1.702 m)   Wt (!) 403 lb (182.8 kg)   LMP  (LMP Unknown) Comment: irregular periods previously, bleeding started in january  SpO2 98%   BMI 63.12 kg/m   BP/Weight 07/01/2019 8/46/6599 04/16/7015  Systolic BP 793 903 009  Diastolic BP 73 73 60  Wt. (Lbs) 403 404.2 -  BMI 63.12 63.31 -      Physical Exam Constitutional:      Appearance: She is well-developed.  Neck:     Vascular: No JVD.  Cardiovascular:     Rate and Rhythm: Normal rate.     Heart  sounds: Murmur present.  Pulmonary:     Effort: Pulmonary effort is normal.     Breath sounds: Normal breath sounds. No wheezing or rales.  Chest:     Chest wall: No tenderness.  Abdominal:     General: Bowel sounds are normal. There is no distension.     Palpations: Abdomen is soft. There is no mass.     Tenderness: There is no abdominal tenderness.  Musculoskeletal:        General: Normal range of motion.     Right lower leg: No edema.     Left lower leg: No edema.  Skin:    Comments: Erythematous rash on dorsal aspects of both forearms bilateral calf with associated scaling  Neurological:     Mental Status: She is alert and oriented to person, place, and time.  Psychiatric:        Mood and Affect: Mood normal.     CMP Latest Ref Rng & Units 11/09/2018 06/16/2018 12/16/2017  Glucose 65 - 99 mg/dL 131(H) 330(H) 127(H)  BUN 6 - 24 mg/dL _0 Creatinine 0.57 - 1.00 mg/dL 0.73 0.60 0.74  Sodium 134 - 144 mmol/L 143 138 143  Potassium 3.5 - 5.2 mmol/L 3.8 4.5 4.0  Chloride 96 - 106 mmol/L 104 98 108  CO2 20 - 29 mmol/L _1 Calcium 8.7 - 10.2 mg/dL 9.8 9.4 9.0  Total Protein 6.0 - 8.5 g/dL 6.6 6.7 -  Total Bilirubin 0.0 - 1.2  mg/dL 1.6(H) 1.3(H) -  Alkaline Phos 39 - 117 IU/L 131(H) 152(H) -  AST 0 - 40 IU/L 41(H) 33 -  ALT 0 - 32 IU/L 25 23 -    Lipid Panel     Component Value Date/Time   CHOL 147 11/09/2018 1016   TRIG 93 11/09/2018 1016   HDL 71 11/09/2018 1016   CHOLHDL 2.1 11/09/2018 1016   CHOLHDL 2.1 12/26/2015 0929   VLDL 15 12/26/2015 0929   LDLCALC 59 11/09/2018 1016    CBC    Component Value Date/Time   WBC 5.7 12/08/2018 1507   WBC 3.0 (L) 09/26/2017 1925   RBC 4.85 12/08/2018 1507   RBC 4.79 09/26/2017 1925   HGB 15.0 12/08/2018 1507   HCT 43.4 12/08/2018 1507   PLT 84 (LL) 12/08/2018 1507   MCV 90 12/08/2018 1507   MCH 30.9 12/08/2018 1507   MCH 24.0 (L) 09/26/2017 1925   MCHC 34.6 12/08/2018 1507   MCHC 30.6 09/26/2017 1925   RDW 13.7 12/08/2018 1507   LYMPHSABS 1.0 12/08/2018 1507   MONOABS 0.2 07/02/2016 1035   EOSABS 0.1 12/08/2018 1507   BASOSABS 0.0 12/08/2018 1507    Lab Results  Component Value Date   HGBA1C 6.4 07/01/2019    Lab Results  Component Value Date   TSH 0.079 (L) 03/15/2019    Assessment & Plan:  1. Type 2 diabetes mellitus with other neurologic complication, without long-term current use of insulin (HCC) Controlled with A1c of 6.4 Continue current management Counseled on Diabetic diet, my plate method, 503 minutes of moderate intensity exercise/week Blood sugar logs with fasting goals of 80-120 mg/dl, random of less than 180 and in the event of sugars less than 60 mg/dl or greater than 400 mg/dl encouraged to notify the clinic. Advised on the need for annual eye exams, annual foot exams, Pneumonia vaccine. - POCT glucose (manual entry) - POCT glycosylated hemoglobin (Hb A1C) - CMP14+EGFR - Lipid panel - atorvastatin (LIPITOR) 20  MG tablet; Take 1 tablet (20 mg total) by mouth daily.  Dispense: 90 tablet; Refill: 1 - glipiZIDE (GLUCOTROL) 10 MG tablet; Take 1 tablet (10 mg total) by mouth 2 (two) times daily before a meal.  Dispense: 180  tablet; Refill: 1 - liraglutide (VICTOZA) 18 MG/3ML SOPN; Inject 0.3 mLs (1.8 mg total) into the skin daily.  Dispense: 30 mL; Refill: 6 - metFORMIN (GLUCOPHAGE) 500 MG tablet; Take 2 tablets (1,000 mg total) by mouth 2 (two) times daily with a meal.  Dispense: 360 tablet; Refill: 1  2. Moderate persistent asthma without complication Stable - albuterol (VENTOLIN HFA) 108 (90 Base) MCG/ACT inhaler; Inhale 1-2 puffs into the lungs every 6 (six) hours as needed for wheezing or shortness of breath.  Dispense: 54 g; Refill: 3 - budesonide-formoterol (SYMBICORT) 160-4.5 MCG/ACT inhaler; Inhale 2 puffs into the lungs 2 (two) times daily.  Dispense: 1 Inhaler; Refill: 5 - montelukast (SINGULAIR) 10 MG tablet; Take 1 tablet (10 mg total) by mouth at bedtime.  Dispense: 90 tablet; Refill: 1  3. Hypothyroidism, unspecified type Uncontrolled with suppressed TSH last month We will send of thyroid labs and adjust medication accordingly - T4, free - TSH  4. Mild episode of recurrent major depressive disorder (HCC) Controlled Continue Cymbalta  5. Seasonal allergies Stable Status post sinus surgery - fluticasone (FLONASE) 50 MCG/ACT nasal spray; Place 2 sprays into both nostrils daily.  Dispense: 16 g; Refill: 6  6. Essential hypertension Slightly elevated above goal No regimen change today Counseled on blood pressure goal of less than 130/80, low-sodium, DASH diet, medication compliance, 150 minutes of moderate intensity exercise per week. Discussed medication compliance, adverse effects. - losartan (COZAAR) 50 MG tablet; TAKE 1 TABLET BY MOUTH ONCE DAILY.  Dispense: 90 tablet; Refill: 1  7. Other cirrhosis of liver ([place]) Asymptomatic  8. Endometrial cancer [facility]) S/pTotal abdominal hysterectomy and bilateral salpingo-oophorectomy in 06/2016, adjuvant treatment due to low risk for recurrence      Charlott Rakes, MD, FAAFP. [facility] [place],  [place]   07/01/2019, 9:12 AM

## 2019-07-02 LAB — CMP14+EGFR
ALT: 26 IU/L (ref 0–32)
AST: 51 IU/L — ABNORMAL HIGH (ref 0–40)
Albumin/Globulin Ratio: 1.4 (ref 1.2–2.2)
Albumin: 3.8 g/dL (ref 3.8–4.8)
Alkaline Phosphatase: 153 IU/L — ABNORMAL HIGH (ref 48–121)
BUN/Creatinine Ratio: 12 (ref 9–23)
BUN: 8 mg/dL (ref 6–24)
Bilirubin Total: 1.5 mg/dL — ABNORMAL HIGH (ref 0.0–1.2)
CO2: 24 mmol/L (ref 20–29)
Calcium: 8.9 mg/dL (ref 8.7–10.2)
Chloride: 106 mmol/L (ref 96–106)
Creatinine, Ser: 0.68 mg/dL (ref 0.57–1.00)
GFR calc Af Amer: 118 mL/min/{1.73_m2} (ref >59)
GFR calc non Af Amer: 102 mL/min/{1.73_m2} (ref >59)
Globulin, Total: 2.8 g/dL (ref 1.5–4.5)
Glucose: 127 mg/dL — ABNORMAL HIGH (ref 65–99)
Potassium: 4 mmol/L (ref 3.5–5.2)
Sodium: 143 mmol/L (ref 134–144)
Total Protein: 6.6 g/dL (ref 6.0–8.5)

## 2019-07-02 LAB — TSH: TSH: 0.8 u[IU]/mL (ref 0.450–4.500)

## 2019-07-02 LAB — T4, FREE: Free T4: 1.03 ng/dL (ref 0.82–1.77)

## 2019-07-02 LAB — LIPID PANEL
Chol/HDL Ratio: 1.8 ratio (ref 0.0–4.4)
Cholesterol, Total: 101 mg/dL (ref 100–199)
HDL: 57 mg/dL (ref >39)
LDL Chol Calc (NIH): 29 mg/dL (ref 0–99)
Triglycerides: 70 mg/dL (ref 0–149)
VLDL Cholesterol Cal: 15 mg/dL (ref 5–40)

## 2019-07-03 ENCOUNTER — Other Ambulatory Visit: Payer: Self-pay | Admitting: Family Medicine

## 2019-07-03 DIAGNOSIS — E039 Hypothyroidism, unspecified: Secondary | ICD-10-CM

## 2019-07-03 MED ORDER — LEVOTHYROXINE SODIUM 137 MCG PO TABS: 137.0000 ug | ORAL_TABLET | Freq: Every day | ORAL | 3 refills | Status: DC

## 2019-07-05 ENCOUNTER — Encounter: Payer: Self-pay | Admitting: Allergy and Immunology

## 2019-07-05 ENCOUNTER — Ambulatory Visit: Payer: Self-pay | Admitting: *Deleted

## 2019-07-05 ENCOUNTER — Telehealth: Payer: Self-pay

## 2019-07-05 ENCOUNTER — Other Ambulatory Visit: Payer: Self-pay

## 2019-07-05 ENCOUNTER — Ambulatory Visit (INDEPENDENT_AMBULATORY_CARE_PROVIDER_SITE_OTHER): Payer: Medicaid Other | Admitting: Allergy and Immunology

## 2019-07-05 VITALS — BP 146/68 | HR 94 | Resp 18

## 2019-07-05 DIAGNOSIS — J324 Chronic pansinusitis: Secondary | ICD-10-CM

## 2019-07-05 DIAGNOSIS — J4550 Severe persistent asthma, uncomplicated: Secondary | ICD-10-CM

## 2019-07-05 DIAGNOSIS — J309 Allergic rhinitis, unspecified: Secondary | ICD-10-CM

## 2019-07-05 DIAGNOSIS — J3089 Other allergic rhinitis: Secondary | ICD-10-CM

## 2019-07-05 DIAGNOSIS — J339 Nasal polyp, unspecified: Secondary | ICD-10-CM

## 2019-07-05 DIAGNOSIS — E662 Morbid (severe) obesity with alveolar hypoventilation: Secondary | ICD-10-CM | POA: Diagnosis not present

## 2019-07-05 DIAGNOSIS — K219 Gastro-esophageal reflux disease without esophagitis: Secondary | ICD-10-CM

## 2019-07-05 NOTE — Progress Notes (Signed)
[place] [facility] [place] [facility]   Follow-up Note  Referring Provider: Charlott Rakes, MD Primary Provider: Charlott Rakes, MD Date of Office Visit: 07/05/2019  Subjective:   Janet [name] (DOB: Dec 15, 1970) is a 49 y.o. female who returns to the [facility] [place] on 07/05/2019 in re-evaluation of the following:  HPI: Meilany returns to this clinic in evaluation of severe asthma and allergic rhinitis and chronic sinusitis and suspected obesity hypoventilation syndrome and exercise-induced hypoxemia and LPR.  Her last visit to this clinic was 12 April 2019.  She is feeling great.  She does not need to use a short acting bronchodilator.  She has not required a systemic steroid to treat an exacerbation of asthma since I have seen her in this clinic.  She does not really exert herself very much.  She has been using her Symbicort just 1 time per day as well as her dupilumab injections.  Her nose is doing wonderful.  She can smell.  She has no nasal congestion.  She has not required an antibiotic to treat an episode of sinusitis.  She continues on a nasal steroid and montelukast.  Reflux has been under excellent control with her proton pump inhibitor.  She still drinks 1 coffee per day.  She has not been using any oxygen when she exerts herself at this point.  She has started a course of immunotherapy without any adverse effect.  She is hesitant to receive the Covid vaccination.  Allergies as of 07/05/2019      Reactions   Ciprofloxacin Shortness Of Breath   Shrimp [shellfish Allergy] Other (See Comments)   positive allergy test      Medication List      albuterol (2.5 MG/3ML) 0.083% nebulizer solution Commonly known as: PROVENTIL Take 3 mLs (2.5 mg total) by nebulization every 6 (six) hours as needed for wheezing or shortness of breath.   albuterol 108 (90 Base) MCG/ACT inhaler Commonly known as: VENTOLIN HFA Inhale 1-2 puffs into the  lungs every 6 (six) hours as needed for wheezing or shortness of breath.   atorvastatin 20 MG tablet Commonly known as: LIPITOR Take 1 tablet (20 mg total) by mouth daily.   budesonide-formoterol 160-4.5 MCG/ACT inhaler Commonly known as: Symbicort Inhale 2 puffs into the lungs 2 (two) times daily.   cholecalciferol 1000 units tablet Commonly known as: VITAMIN D Take 1,000 Units by mouth at bedtime.   DULoxetine 60 MG capsule Commonly known as: Cymbalta Take 1 capsule (60 mg total) by mouth daily.   EPINEPHrine 0.3 mg/0.3 mL Soaj injection Commonly known as: EPI-PEN Use as directed for life threatening allergic reactions   EQ Allergy Relief (Cetirizine) 10 MG tablet Generic drug: cetirizine Take 1 tablet by mouth once daily   fluticasone 50 MCG/ACT nasal spray Commonly known as: FLONASE Place 2 sprays into both nostrils daily.   glipiZIDE 10 MG tablet Commonly known as: GLUCOTROL Take 1 tablet (10 mg total) by mouth 2 (two) times daily before a meal.   levothyroxine 137 MCG tablet Commonly known as: SYNTHROID Take 1 tablet (137 mcg total) by mouth daily before breakfast.   losartan 50 MG tablet Commonly known as: COZAAR TAKE 1 TABLET BY MOUTH ONCE DAILY.   metFORMIN 500 MG tablet Commonly known as: GLUCOPHAGE Take 2 tablets (1,000 mg total) by mouth 2 (two) times daily with a meal.   montelukast 10 MG tablet Commonly known as: SINGULAIR Take 1 tablet (10 mg total) by mouth  at bedtime.   multivitamin tablet Take 1 tablet by mouth daily.   omeprazole 40 MG capsule Commonly known as: PRILOSEC Take 1 capsule (40 mg total) by mouth daily.   OXcarbazepine 150 MG tablet Commonly known as: TRILEPTAL Take 1 tablet (150 mg total) by mouth 2 (two) times daily.   pregabalin 75 MG capsule Commonly known as: LYRICA Take 1 capsule (75 mg total) by mouth 2 (two) times daily.   triamcinolone cream 0.1 % Commonly known as: KENALOG Apply 1 application topically 2 (two)  times daily.   TRUEplus Pen Needles 32G X 4 MM Misc Generic drug: Insulin Pen Needle Use to inject Victoza daily.   Victoza 18 MG/3ML Sopn Generic drug: liraglutide Inject 0.3 mLs (1.8 mg total) into the skin daily.       Past Medical History:  Diagnosis Date  . Anemia   . Angio-edema   . Anxiety   . Asthma   . Depression   . Hypothyroidism   . Pre-diabetes   . Right foot drop   . Sleep apnea   . Thyroid disease   . Urticaria   . Uterine cancer [facility])     Past Surgical History:  Procedure Laterality Date  . ABDOMINAL HYSTERECTOMY    . CARPAL TUNNEL RELEASE    . CESAREAN SECTION    . CHOLECYSTECTOMY    . cyst removal     uterus  . DILATION AND CURETTAGE OF UTERUS     still birth  . DILATION AND CURETTAGE OF UTERUS N/A 06/11/2016   Procedure: DILATATION AND CURETTAGE;  Surgeon: Emily Filbert, MD;  Location: [facility];  Service: Gynecology;  Laterality: N/A;  . ROBOTIC ASSISTED TOTAL HYSTERECTOMY WITH BILATERAL SALPINGO OOPHERECTOMY Bilateral 07/09/2016   Procedure: XI ROBOTIC ASSISTED TOTAL HYSTERECTOMY WITH BILATERAL SALPINGO OOPHORECTOMY FOR UTERUS GREATER THAN 250 GRAMS, LYSIS OF ADHESIONS;  Surgeon: Everitt Amber, MD;  Location: [facility];  Service: Gynecology;  Laterality: Bilateral;  . SINOSCOPY    . SINUS EXPLORATION    . WISDOM TOOTH EXTRACTION      Review of systems negative except as noted in HPI / PMHx or noted below:  Review of Systems  Constitutional: Negative.   HENT: Negative.   Eyes: Negative.   Respiratory: Negative.   Cardiovascular: Negative.   Gastrointestinal: Negative.   Genitourinary: Negative.   Musculoskeletal: Negative.   Skin: Negative.   Neurological: Negative.   Endo/Heme/Allergies: Negative.   Psychiatric/Behavioral: Negative.      Objective:   Vitals:   07/05/19 0842 07/05/19 0856  BP: (!) 146/68   Pulse: 94   Resp: 18   SpO2: 95% 92%          Physical Exam Constitutional:      Appearance: She is not diaphoretic.  HENT:       Head: Normocephalic.     Right Ear: Tympanic membrane, ear canal and external ear normal.     Left Ear: Tympanic membrane, ear canal and external ear normal.     Nose: Nose normal. No mucosal edema or rhinorrhea.     Mouth/Throat:     Pharynx: Uvula midline. No oropharyngeal exudate.  Eyes:     Conjunctiva/sclera: Conjunctivae normal.  Neck:     Thyroid: No thyromegaly.     Trachea: Trachea normal. No tracheal tenderness or tracheal deviation.  Cardiovascular:     Rate and Rhythm: Normal rate and regular rhythm.     Heart sounds: S1 normal and S2 normal. Murmur (systolic) present.  Pulmonary:  Effort: No respiratory distress.     Breath sounds: Normal breath sounds. No stridor. No wheezing or rales.  Lymphadenopathy:     Head:     Right side of head: No tonsillar adenopathy.     Left side of head: No tonsillar adenopathy.     Cervical: No cervical adenopathy.  Skin:    Findings: No erythema or rash.     Nails: There is no clubbing.  Neurological:     Mental Status: She is alert.     Diagnostics:    Spirometry was performed and demonstrated an FEV1 of 2.68 at 87 % of predicted.  Oxygen saturation on room air at rest was 95%.  Oxygen saturation during walking the hallway on room air was 92%.  Assessment and Plan:   1. Asthma, severe persistent, well-controlled   2. Obesity hypoventilation syndrome (HCC)   3. Other allergic rhinitis   4. Chronic pansinusitis   5. Nasal polyposis   6. LPRD (laryngopharyngeal reflux disease)     1.  Continue to treat and inflammation:   A.  Symbicort 160 - 2 inhalations 1-2 times per day  B.  Flonase - 1 spray each nostril 1-2 times per day  C.  Montelukast 10 mg - 1 tablet 1 time per day  D.  Dupilumab injections every 2 weeks  E.  Immunotherapy  2.  Continue to treat reflux:   A. Omeprazole 40 mg - 1 tablet 1 time per day  3. Obtain COVID vaccine  4. Return to clinic in 12 weeks or earlier if problem  Janet [name]  appears to be doing much better at this point in time and she will remain on a collection of anti-inflammatory agents directed at her respiratory tract as noted above and continue to treat reflux and I will see her back in his clinic in 12 weeks or earlier if there is a problem.  Allena Katz, MD Allergy / Immunology [place]

## 2019-07-05 NOTE — Patient Instructions (Addendum)
  1.  Continue to treat and inflammation:   A.  Symbicort 160 - 2 inhalations 1-2 times per day  B.  Flonase - 1 spray each nostril 1-2 times per day  C.  Montelukast 10 mg - 1 tablet 1 time per day  D.  Dupilumab injections every 2 weeks  E.  Immunotherapy  2.  Continue to treat reflux:   A. Omeprazole 40 mg - 1 tablet 1 time per day  3. Obtain COVID vaccine  4. Return to clinic in 12 weeks or earlier if problem

## 2019-07-05 NOTE — Telephone Encounter (Signed)
Janet [name] stating that she is canceling her appointment tomorrow 07-06-19 @1  pm with Dr. Denman George.   She will call back at a later time to reschedule.

## 2019-07-06 ENCOUNTER — Encounter: Payer: Self-pay | Admitting: Allergy and Immunology

## 2019-07-06 ENCOUNTER — Ambulatory Visit: Payer: Medicaid Other | Admitting: Gynecologic Oncology

## 2019-07-09 ENCOUNTER — Encounter: Payer: Self-pay | Admitting: Allergy and Immunology

## 2019-07-12 ENCOUNTER — Other Ambulatory Visit: Payer: Self-pay | Admitting: Neurology

## 2019-07-12 DIAGNOSIS — E1149 Type 2 diabetes mellitus with other diabetic neurological complication: Secondary | ICD-10-CM

## 2019-07-14 ENCOUNTER — Ambulatory Visit: Payer: Medicaid Other | Admitting: Family Medicine

## 2019-07-18 ENCOUNTER — Other Ambulatory Visit: Payer: Self-pay | Admitting: Family Medicine

## 2019-07-18 DIAGNOSIS — E039 Hypothyroidism, unspecified: Secondary | ICD-10-CM

## 2019-07-21 ENCOUNTER — Ambulatory Visit
Admission: RE | Admit: 2019-07-21 | Discharge: 2019-07-21 | Disposition: A | Discharge status: Home / Self Care | Payer: Medicaid Other | Source: Ambulatory Visit | Attending: Family Medicine | Admitting: Family Medicine

## 2019-07-21 ENCOUNTER — Other Ambulatory Visit: Payer: Self-pay

## 2019-07-21 DIAGNOSIS — Z1231 Encounter for screening mammogram for malignant neoplasm of breast: Secondary | ICD-10-CM

## 2019-07-22 ENCOUNTER — Ambulatory Visit (INDEPENDENT_AMBULATORY_CARE_PROVIDER_SITE_OTHER): Payer: Medicaid Other | Admitting: *Deleted

## 2019-07-22 DIAGNOSIS — J309 Allergic rhinitis, unspecified: Secondary | ICD-10-CM | POA: Diagnosis not present

## 2019-07-25 ENCOUNTER — Other Ambulatory Visit: Payer: Self-pay | Admitting: Family Medicine

## 2019-07-25 DIAGNOSIS — E039 Hypothyroidism, unspecified: Secondary | ICD-10-CM

## 2019-07-29 ENCOUNTER — Ambulatory Visit (INDEPENDENT_AMBULATORY_CARE_PROVIDER_SITE_OTHER): Payer: Medicaid Other | Admitting: *Deleted

## 2019-07-29 DIAGNOSIS — J309 Allergic rhinitis, unspecified: Secondary | ICD-10-CM

## 2019-08-02 ENCOUNTER — Encounter: Payer: Self-pay | Admitting: Family Medicine

## 2019-08-02 ENCOUNTER — Other Ambulatory Visit: Payer: Self-pay | Admitting: Family Medicine

## 2019-08-02 DIAGNOSIS — E039 Hypothyroidism, unspecified: Secondary | ICD-10-CM

## 2019-08-02 MED ORDER — LEVOTHYROXINE SODIUM 137 MCG PO TABS: 137.0000 ug | ORAL_TABLET | Freq: Every day | ORAL | 3 refills | Status: DC

## 2019-08-09 ENCOUNTER — Ambulatory Visit (INDEPENDENT_AMBULATORY_CARE_PROVIDER_SITE_OTHER): Payer: Medicaid Other

## 2019-08-09 DIAGNOSIS — J309 Allergic rhinitis, unspecified: Secondary | ICD-10-CM | POA: Diagnosis not present

## 2019-09-01 ENCOUNTER — Encounter: Payer: Self-pay | Admitting: Family Medicine

## 2019-09-02 ENCOUNTER — Ambulatory Visit: Payer: Self-pay

## 2019-09-02 ENCOUNTER — Encounter: Payer: Medicaid Other | Admitting: Physical Medicine & Rehabilitation

## 2019-09-02 ENCOUNTER — Other Ambulatory Visit: Payer: Self-pay

## 2019-09-02 ENCOUNTER — Ambulatory Visit: Payer: Medicaid Other | Attending: Internal Medicine | Admitting: Internal Medicine

## 2019-09-02 DIAGNOSIS — M79661 Pain in right lower leg: Secondary | ICD-10-CM

## 2019-09-02 MED ORDER — CYCLOBENZAPRINE HCL 5 MG PO TABS: 5.0000 mg | ORAL_TABLET | Freq: Two times a day (BID) | ORAL | 0 refills | Status: DC

## 2019-09-02 NOTE — Telephone Encounter (Signed)
Pt. Reports her right leg started hurting yesterday after swimming at the [facility] x 2 hours. Hurts worse when touched. Reports "My legs stay swollen all the time, but there is no extra swelling." No redness. No chest pain or shortness of breath. Reports her mother dies of "a blood clot so I'm worried." Spoke with Gaetano Net in the practice and appointment made for this afternoon.Instructed if symptoms worsen go to ED. Verbalizes understanding.  Reason for Disposition . [1] Thigh, calf, or ankle swelling AND [2] only 1 side  Answer Assessment - Initial Assessment Questions 1. ONSET: "When did the pain start?"      Yesterday 2. LOCATION: "Where is the pain located?"      Right 3. PAIN: "How bad is the pain?"    (Scale 1-10; or mild, moderate, severe)   -  MILD (1-3): doesn't interfere with normal activities    -  MODERATE (4-7): interferes with normal activities (e.g., work or school) or awakens from sleep, limping    -  SEVERE (8-10): excruciating pain, unable to do any normal activities, unable to walk     If touched 8 ,  At rest - 1 4. WORK OR EXERCISE: "Has there been any recent work or exercise that involved this part of the body?"      No 5. CAUSE: "What do you think is causing the leg pain?"     Maybe a blood clot 6. OTHER SYMPTOMS: "Do you have any other symptoms?" (e.g., chest pain, back pain, breathing difficulty, swelling, rash, fever, numbness, weakness)     No 7. PREGNANCY: "Is there any chance you are pregnant?" "When was your last menstrual period?"     No  Protocols used: LEG PAIN-A-AH

## 2019-09-02 NOTE — Telephone Encounter (Signed)
Noted  

## 2019-09-02 NOTE — Progress Notes (Signed)
Virtual Visit via Telephone Note Due to current restrictions/limitations of in-office visits due to the COVID-19 pandemic, this scheduled clinical appointment was converted to a telehealth visit  I connected with Janet [name] on 09/02/19 at 2:29 a.m by telephone and verified that I am speaking with the correct person using two identifiers. I am in my office.  The patient is at home.  Only the patient and myself participated in this encounter.  I discussed the limitations, risks, security and privacy concerns of performing an evaluation and management service by telephone and the availability of in person appointments. I also discussed with the patient that there may be a patient responsible charge related to this service. The patient expressed understanding and agreed to proceed.   History of Present Illness: 50 year old female with a history of DM type 2 with neuropathy, hypothyroidism, asthma, Seasonal allergies, endometrial adenocarcinoma (s/pTotal abdominal hysterectomy and bilateral salpingo-oophorectomy in 06/2016, adjuvant treatment due to low risk for recurrence), morbid obesity   Pt c/o pain in RT calf since yesterday.  No initiating factors. Hurts when anything touches it and when she lays down.  Rates pain as 8/10. Hurts a little when walking.  Went to the [facility] for the first time yesterday to use the pool.  She was in the pool for 1.5 hrs walking and swimming.  Always has a little swelling in both legs but nothing more than before.  No redness. No recent long distance travel by plain or car.  She has not taken anything for pain as yet  Outpatient Encounter Medications as of 09/02/2019  Medication Sig  . albuterol (PROVENTIL) (2.5 MG/3ML) 0.083% nebulizer solution Take 3 mLs (2.5 mg total) by nebulization every 6 (six) hours as needed for wheezing or shortness of breath.  Marland Kitchen albuterol (VENTOLIN HFA) 108 (90 Base) MCG/ACT inhaler Inhale 1-2 puffs into the lungs every 6 (six) hours as  needed for wheezing or shortness of breath.  Marland Kitchen atorvastatin (LIPITOR) 20 MG tablet Take 1 tablet (20 mg total) by mouth daily.  . budesonide-formoterol (SYMBICORT) 160-4.5 MCG/ACT inhaler Inhale 2 puffs into the lungs 2 (two) times daily.  . cholecalciferol (VITAMIN D) 1000 units tablet Take 1,000 Units by mouth at bedtime.  . DULoxetine (CYMBALTA) 60 MG capsule Take 1 capsule (60 mg total) by mouth daily.  Marland Kitchen EPINEPHrine 0.3 mg/0.3 mL IJ SOAJ injection Use as directed for life threatening allergic reactions  . EQ ALLERGY RELIEF, CETIRIZINE, 10 MG tablet Take 1 tablet by mouth once daily  . fluticasone (FLONASE) 50 MCG/ACT nasal spray Place 2 sprays into both nostrils daily.  Marland Kitchen glipiZIDE (GLUCOTROL) 10 MG tablet Take 1 tablet (10 mg total) by mouth 2 (two) times daily before a meal.  . Insulin Pen Needle (TRUEPLUS PEN NEEDLES) 32G X 4 MM MISC Use to inject Victoza daily.  Marland Kitchen levothyroxine (SYNTHROID) 137 MCG tablet Take 1 tablet (137 mcg total) by mouth daily before breakfast.  . liraglutide (VICTOZA) 18 MG/3ML SOPN Inject 0.3 mLs (1.8 mg total) into the skin daily.  Marland Kitchen losartan (COZAAR) 50 MG tablet TAKE 1 TABLET BY MOUTH ONCE DAILY.  . metFORMIN (GLUCOPHAGE) 500 MG tablet Take 2 tablets (1,000 mg total) by mouth 2 (two) times daily with a meal.  . montelukast (SINGULAIR) 10 MG tablet Take 1 tablet (10 mg total) by mouth at bedtime.  . Multiple Vitamin (MULTIVITAMIN) tablet Take 1 tablet by mouth daily.  Marland Kitchen omeprazole (PRILOSEC) 40 MG capsule Take 1 capsule (40 mg total) by mouth daily.  Marland Kitchen  OXcarbazepine (TRILEPTAL) 150 MG tablet Take 1 tablet (150 mg total) by mouth 2 (two) times daily.  . pregabalin (LYRICA) 75 MG capsule Take 1 capsule by mouth twice daily  . triamcinolone cream (KENALOG) 0.1 % Apply 1 application topically 2 (two) times daily.   No facility-administered encounter medications on file as of 09/02/2019.      Observations/Objective: No direct observation done as this was a  telephone encounter.  Assessment and Plan: 1. Pain of right calf Differential diagnosis includes pulled muscle from the pool exercises that she was doing yesterday versus DVT.  I recommend taking some ibuprofen and Flexeril as needed.  She has ibuprofen at home.  I have prescribed some Flexeril.  Patient told that the Flexeril can cause some drowsiness and not to take it when she has to drive or operate any machinery.  We will schedule for Doppler ultrasound.  Patient states she has an appointment tomorrow in [place] with her back specialist and would like for [place] to schedule it around that time. - cyclobenzaprine (FLEXERIL) 5 MG tablet; Take 1 tablet (5 mg total) by mouth 2 (two) times daily.  Dispense: 30 tablet; Refill: 0 - VAS [place] LOWER EXTREMITY VENOUS (DVT); Future   Follow Up Instructions:    I discussed the assessment and treatment plan with the patient. The patient was provided an opportunity to ask questions and all were answered. The patient agreed with the plan and demonstrated an understanding of the instructions.   The patient was advised to call back or seek an in-person evaluation if the symptoms worsen or if the condition fails to improve as anticipated.  I provided 10 minutes of non-face-to-face time during this encounter.   Karle Plumber, MD

## 2019-09-02 NOTE — Progress Notes (Signed)
Pt states the pain is a 10 when someone touches it   Pt states the pain started yesterday

## 2019-09-03 ENCOUNTER — Other Ambulatory Visit: Payer: Self-pay

## 2019-09-03 ENCOUNTER — Encounter: Payer: Self-pay | Admitting: Physical Medicine & Rehabilitation

## 2019-09-03 ENCOUNTER — Ambulatory Visit: Payer: Self-pay | Admitting: *Deleted

## 2019-09-03 ENCOUNTER — Telehealth: Payer: Self-pay

## 2019-09-03 ENCOUNTER — Encounter: Payer: Medicaid Other | Attending: Physical Medicine & Rehabilitation | Admitting: Physical Medicine & Rehabilitation

## 2019-09-03 ENCOUNTER — Ambulatory Visit (HOSPITAL_COMMUNITY)
Admission: RE | Admit: 2019-09-03 | Discharge: 2019-09-03 | Disposition: A | Discharge status: Home / Self Care | Payer: Medicaid Other | Source: Ambulatory Visit | Attending: Internal Medicine | Admitting: Internal Medicine

## 2019-09-03 VITALS — BP 138/78 | HR 99 | Temp 98.4°F | Ht 67.0 in | Wt >= 6400 oz

## 2019-09-03 DIAGNOSIS — M533 Sacrococcygeal disorders, not elsewhere classified: Secondary | ICD-10-CM | POA: Insufficient documentation

## 2019-09-03 DIAGNOSIS — M79661 Pain in right lower leg: Secondary | ICD-10-CM | POA: Diagnosis present

## 2019-09-03 DIAGNOSIS — G8929 Other chronic pain: Secondary | ICD-10-CM | POA: Insufficient documentation

## 2019-09-03 MED ORDER — DIAZEPAM 5 MG PO TABS
5.0000 mg | ORAL_TABLET | Freq: Four times a day (QID) | ORAL | 0 refills | Status: DC | PRN
Start: 2019-09-03 — End: 2020-09-19

## 2019-09-03 NOTE — Patient Instructions (Signed)
Repeat Left sacroiliac radiofrequency next month

## 2019-09-03 NOTE — Progress Notes (Signed)
Right lower extremity venous duplex has been completed. Preliminary results can be found in CV Proc through chart review.  Results were given to [facility] at Dr. Durenda Age office.  09/03/19 1:03 PM Carlos Levering RVT

## 2019-09-03 NOTE — Progress Notes (Signed)
Subjective:    Patient ID: Janet [name], female    DOB: September 21, 1970, 49 y.o.   MRN: [id number]  HPI  49 year old female with approximately 2-year history of low back pain radiating into the left lower extremity. Left sacroiliac RF performed in January 2021 lasted ~33mo started hurting again ~36mo ago Interval history she has developed right lower extremity pain at will undergo venous Doppler this afternoon Has been seen by PCP for this Bariatric surgery planned within the next 3 months Pain Inventory Average Pain 4 Pain Right Now 5 My pain is intermittent and burning  In the last 24 hours, has pain interfered with the following? General activity 5 Relation with others 0 Enjoyment of life 5 What TIME of day is your pain at its worst? evening Sleep (in general) Fair  Pain is worse with: walking, bending, standing and some activites Pain improves with: rest and injections Relief from Meds: 1  Mobility use a walker how many minutes can you walk? 10 mins ability to climb steps?  yes do you drive?  yes  Function disabled: date disabled 2020 I need assistance with the following:  shopping Do you have any goals in this area?  yes  Neuro/Psych bowel control problems tingling trouble walking  Prior Studies Any changes since last visit?  no  Physicians involved in your care Any changes since last visit?  no   Family History  Problem Relation Age of Onset   Heart attack Mother    Allergic rhinitis Mother    Suicidality Father    Social History   Socioeconomic History   Marital status: Widowed    Spouse name: Not on file   Number of children: 1   Years of education: college   Highest education level: Not on file  Occupational History   Occupation: Unemployed  Tobacco Use   Smoking status: Never Smoker   Smokeless tobacco: Never Used  Scientific laboratory technician Use: Never used  Substance and Sexual Activity   Alcohol use: No   Drug use: No    Sexual activity: Not Currently    Birth control/protection: None  Other Topics Concern   Not on file  Social History Narrative   Lives at home with her son.   Right-handed.   2 cups caffeine per day.   Social Determinants of Health   Financial Resource Strain:    Difficulty of Paying Living Expenses:   Food Insecurity:    Worried About Charity fundraiser in the Last Year:    Arboriculturist in the Last Year:   Transportation Needs:    Film/video editor (Medical):    Lack of Transportation (Non-Medical):   Physical Activity:    Days of Exercise per Week:    Minutes of Exercise per Session:   Stress:    Feeling of Stress :   Social Connections:    Frequency of Communication with Friends and Family:    Frequency of Social Gatherings with Friends and Family:    Attends Religious Services:    Active Member of Clubs or Organizations:    Attends Music therapist:    Marital Status:    Past Surgical History:  Procedure Laterality Date   ABDOMINAL HYSTERECTOMY     CARPAL TUNNEL RELEASE     CESAREAN SECTION     CHOLECYSTECTOMY     cyst removal     uterus   DILATION AND CURETTAGE OF UTERUS     still birth  DILATION AND CURETTAGE OF UTERUS N/A 06/11/2016   Procedure: DILATATION AND CURETTAGE;  Surgeon: Emily Filbert, MD;  Location: [facility];  Service: Gynecology;  Laterality: N/A;   ROBOTIC ASSISTED TOTAL HYSTERECTOMY WITH BILATERAL SALPINGO OOPHERECTOMY Bilateral 07/09/2016   Procedure: XI ROBOTIC ASSISTED TOTAL HYSTERECTOMY WITH BILATERAL SALPINGO OOPHORECTOMY FOR UTERUS GREATER THAN 250 GRAMS, LYSIS OF ADHESIONS;  Surgeon: Everitt Amber, MD;  Location: [facility];  Service: Gynecology;  Laterality: Bilateral;   SINOSCOPY     SINUS EXPLORATION     WISDOM TOOTH EXTRACTION     Past Medical History:  Diagnosis Date   Anemia    Angio-edema    Anxiety    Asthma    Depression    Hypothyroidism    Pre-diabetes    Right foot drop     Sleep apnea    Thyroid disease    Urticaria    Uterine cancer (HCC)    BP (!) 138/78    Pulse 99    Temp 98.4 F (36.9 C)    Ht 5\' 7"  (1.702 m)    Wt (!) 408 lb 9.6 oz (185.3 kg)    LMP  (LMP Unknown) Comment: irregular periods previously, bleeding started in january   SpO2 96%    BMI 64.00 kg/m   Opioid Risk Score:   Fall Risk Score:  `1  Depression screen PHQ 2/9  Depression screen [facility] 2/9 07/01/2019 04/28/2019 03/03/2019 09/22/2018 04/06/2018 09/17/2017 07/23/2017  Decreased Interest 0 0 1 0 1 1 0  Down, Depressed, Hopeless 1 1 0 0 0 1 1  PHQ - 2 Score 1 1 1  0 1 2 1   Altered sleeping 0 1 2 0 0 0 1  Tired, decreased energy 1 1 1  0 1 1 1   Change in appetite 0 2 1 0 0 0 0  Feeling bad or failure about yourself  1 2 1  0 1 0 0  Trouble concentrating 0 0 0 0 0 0 0  Moving slowly or fidgety/restless 0 0 0 0 0 0 0  Suicidal thoughts 0 0 0 0 0 0 0  PHQ-9 Score 3 7 6  0 3 3 3   Difficult doing work/chores - - - - Somewhat difficult - -  Some recent data might be hidden   Review of Systems  Constitutional: Negative.   HENT: Negative.   Eyes: Negative.   Respiratory: Negative.   Cardiovascular: Positive for leg swelling.  Gastrointestinal: Positive for constipation.  Endocrine: Negative.   Genitourinary: Negative.   Musculoskeletal: Positive for back pain and gait problem.  Skin: Negative.   Allergic/Immunologic: Negative.   Neurological: Positive for weakness and numbness.  Psychiatric/Behavioral: Negative.        Objective:   Physical Exam Vitals and nursing note reviewed.  Constitutional:      Appearance: She is obese.  HENT:     Head: Normocephalic and atraumatic.  Eyes:     Extraocular Movements: Extraocular movements intact.     Conjunctiva/sclera: Conjunctivae normal.     Pupils: Pupils are equal, round, and reactive to light.  Musculoskeletal:        General: Tenderness present.     Comments: Left PSIS tenderness to palpation No pain over the greater trochanters  bilaterally.  Neurological:     General: No focal deficit present.     Mental Status: She is alert and oriented to person, place, and time.  Psychiatric:        Mood and Affect: Mood normal.  Behavior: Behavior normal.           Assessment & Plan:  #1.  Sacroiliac disorder left side improved significantly, greater than 50% for at least 5 months post procedure now starting to wear off.  Will schedule for repeat procedure next month, left sacroiliac radiofrequency neurotomy, [phone] Discussed with patient, agrees with plan

## 2019-09-03 NOTE — Telephone Encounter (Signed)
Contacted pt to go over DVT results pt didn't answer lvm asking pt to give a call at her earliest convenience

## 2019-09-03 NOTE — Telephone Encounter (Addendum)
Janet [name] with [facility] vascular U/S calling in a report for this pt.  He did a right duplex U/S for DVT and it looks negative.  I sent this to [facility] for Dr. Margarita Rana as a high priority.  I called but wasn't able to get in contact with any one.

## 2019-09-06 ENCOUNTER — Other Ambulatory Visit: Payer: Self-pay | Admitting: Neurology

## 2019-09-24 ENCOUNTER — Ambulatory Visit: Payer: Medicaid Other | Admitting: Physical Medicine & Rehabilitation

## 2019-09-28 ENCOUNTER — Ambulatory Visit: Payer: Medicaid Other | Attending: Family Medicine | Admitting: Family Medicine

## 2019-09-28 ENCOUNTER — Encounter: Payer: Self-pay | Admitting: Family Medicine

## 2019-09-28 DIAGNOSIS — E1149 Type 2 diabetes mellitus with other diabetic neurological complication: Secondary | ICD-10-CM

## 2019-09-28 DIAGNOSIS — R600 Localized edema: Secondary | ICD-10-CM | POA: Diagnosis not present

## 2019-09-28 DIAGNOSIS — Z1159 Encounter for screening for other viral diseases: Secondary | ICD-10-CM

## 2019-09-28 DIAGNOSIS — K766 Portal hypertension: Secondary | ICD-10-CM | POA: Insufficient documentation

## 2019-09-28 MED ORDER — FUROSEMIDE 20 MG PO TABS: 20.0000 mg | ORAL_TABLET | Freq: Every day | ORAL | 0 refills | Status: DC | PRN

## 2019-09-28 NOTE — Progress Notes (Signed)
Patient states that she has no concerns today.

## 2019-09-28 NOTE — Progress Notes (Signed)
Virtual Visit via Telephone Note  I connected with Janet [name], on 09/28/2019 at 9:22 AM by telephone due to the COVID-19 pandemic and verified that I am speaking with the correct person using two identifiers.   Consent: I discussed the limitations, risks, security and privacy concerns of performing an evaluation and management service by telephone and the availability of in person appointments. I also discussed with the patient that there may be a patient responsible charge related to this service. The patient expressed understanding and agreed to proceed.   Location of Patient: Home  Location of Provider: Clinic   Persons participating in Telemedicine visit: Leeah Politano Farrington-CMA Dr. Margarita Rana     History of Present Illness: Janet [name] is a14 year old female with a history of type 2 diabetes mellitus (A1c of6.4), Diabetic neuropathy, hypothyroidism, asthma, Seasonal allergies, endometrial adenocarcinoma (s/pTotal abdominal hysterectomy and bilateral salpingo-oophorectomy in 06/2016, adjuvant treatment due to low risk for recurrence), morbid obesity who presents todayfor chronic disease management.   She will be starting the process with Bariatric surgery again and will need a form completed by her PCP in case of necessity of her procedure. She is hopeful that she can have her weight loss surgery by the end of the year. Fasting blood sugars have been 150-165 and she has been doing Metformin 1000mg  just in the evening and skips her morning dose as her blood sugars drop at 1-2pm. She is compliant with glipizide and Victoza. She has pedal edema which she has had in the past and is wondering if she can have something for this. In the past she was on a short course of Lasix.  Her other chronic medical conditions are stable. Past Medical History:  Diagnosis Date  . Anemia   . Angio-edema   . Anxiety   . Asthma   . Depression   . Hypothyroidism   .  Pre-diabetes   . Right foot drop   . Sleep apnea   . Thyroid disease   . Urticaria   . Uterine cancer (HCC)    Allergies  Allergen Reactions  . Ciprofloxacin Shortness Of Breath  . Shrimp [Shellfish Allergy] Other (See Comments)    positive allergy test    Current Outpatient Medications on File Prior to Visit  Medication Sig Dispense Refill  . albuterol (PROVENTIL) (2.5 MG/3ML) 0.083% nebulizer solution Take 3 mLs (2.5 mg total) by nebulization every 6 (six) hours as needed for wheezing or shortness of breath. 75 mL 3  . albuterol (VENTOLIN HFA) 108 (90 Base) MCG/ACT inhaler Inhale 1-2 puffs into the lungs every 6 (six) hours as needed for wheezing or shortness of breath. 54 g 3  . atorvastatin (LIPITOR) 20 MG tablet Take 1 tablet (20 mg total) by mouth daily. 90 tablet 1  . budesonide-formoterol (SYMBICORT) 160-4.5 MCG/ACT inhaler Inhale 2 puffs into the lungs 2 (two) times daily. 1 Inhaler 5  . cholecalciferol (VITAMIN D) 1000 units tablet Take 1,000 Units by mouth at bedtime.    . cyclobenzaprine (FLEXERIL) 5 MG tablet Take 1 tablet (5 mg total) by mouth 2 (two) times daily. 30 tablet 0  . DULoxetine (CYMBALTA) 60 MG capsule Take 1 capsule (60 mg total) by mouth daily. 90 capsule 4  . dupilumab (DUPIXENT) 200 MG/1.14ML prefilled syringe Inject into the skin.    Marland Kitchen EPINEPHrine 0.3 mg/0.3 mL IJ SOAJ injection Use as directed for life threatening allergic reactions 2 each 3  . EQ ALLERGY RELIEF, CETIRIZINE, 10 MG tablet Take 1 tablet by  mouth once daily 90 tablet 0  . fluticasone (FLONASE) 50 MCG/ACT nasal spray Place 2 sprays into both nostrils daily. 16 g 6  . glipiZIDE (GLUCOTROL) 10 MG tablet Take 1 tablet (10 mg total) by mouth 2 (two) times daily before a meal. 180 tablet 1  . Insulin Pen Needle (TRUEPLUS PEN NEEDLES) 32G X 4 MM MISC Use to inject Victoza daily. 100 each 11  . levothyroxine (SYNTHROID) 137 MCG tablet Take 1 tablet (137 mcg total) by mouth daily before breakfast. 30  tablet 3  . liraglutide (VICTOZA) 18 MG/3ML SOPN Inject 0.3 mLs (1.8 mg total) into the skin daily. 30 mL 6  . losartan (COZAAR) 50 MG tablet TAKE 1 TABLET BY MOUTH ONCE DAILY. 90 tablet 1  . metFORMIN (GLUCOPHAGE) 500 MG tablet Take 2 tablets (1,000 mg total) by mouth 2 (two) times daily with a meal. 360 tablet 1  . montelukast (SINGULAIR) 10 MG tablet Take 1 tablet (10 mg total) by mouth at bedtime. 90 tablet 1  . Multiple Vitamin (MULTIVITAMIN) tablet Take 1 tablet by mouth daily.    Marland Kitchen omeprazole (PRILOSEC) 40 MG capsule Take 1 capsule (40 mg total) by mouth daily. 90 capsule 1  . OXcarbazepine (TRILEPTAL) 150 MG tablet Take 1 tablet by mouth twice daily 180 tablet 1  . pregabalin (LYRICA) 75 MG capsule Take 1 capsule by mouth twice daily 60 capsule 5  . triamcinolone cream (KENALOG) 0.1 % Apply 1 application topically 2 (two) times daily. 80 g 0  . diazepam (VALIUM) 5 MG tablet Take 1 tablet (5 mg total) by mouth every 6 (six) hours as needed for anxiety. (Patient not taking: Reported on 09/28/2019) 2 tablet 0   No current facility-administered medications on file prior to visit.    Observations/Objective: Awake, alert, oriented x3 Not in acute distress  Lab Results  Component Value Date   HGBA1C 6.4 07/01/2019    Lab Results  Component Value Date   TSH 0.800 07/01/2019    Assessment and Plan: 1. Type 2 diabetes mellitus with other neurologic complication, without long-term current use of insulin (HCC) Controlled with A1c of 6.4 Continue current management We will check A1c in 2 weeks Counseled on Diabetic diet, my plate method, 431 minutes of moderate intensity exercise/week Blood sugar logs with fasting goals of 80-120 mg/dl, random of less than 180 and in the event of sugars less than 60 mg/dl or greater than 400 mg/dl encouraged to notify the clinic. Advised on the need for annual eye exams, annual foot exams, Pneumonia vaccine. - Hemoglobin A1c; Future - Basic Metabolic  Panel; Future  2. Need for hepatitis C screening test - HCV RNA quant rflx ultra or genotyp([facility]); Future  3. Pedal edema Reduce sodium intake, elevate feet Use compression stockings - furosemide (LASIX) 20 MG tablet; Take 1 tablet (20 mg total) by mouth daily as needed.  Dispense: 30 tablet; Refill: 0  4. Morbid obesity ([place]) I will have her drop off the form for her bariatric surgeon in the office Continue to work on reducing portion sizes   Follow Up Instructions: 3 months for chronic disease management   I discussed the assessment and treatment plan with the patient. The patient was provided an opportunity to ask questions and all were answered. The patient agreed with the plan and demonstrated an understanding of the instructions.   The patient was advised to call back or seek an in-person evaluation if the symptoms worsen or if the condition fails to improve  as anticipated.     I provided 16 minutes total of non-face-to-face time during this encounter including median intraservice time, reviewing previous notes, investigations, ordering medications, medical decision making, coordinating care and patient verbalized understanding at the end of the visit.     Charlott Rakes, MD, FAAFP. [facility] and [place], [place]   09/28/2019, 9:22 AM

## 2019-10-01 ENCOUNTER — Encounter: Payer: Medicaid Other | Admitting: Physical Medicine & Rehabilitation

## 2019-10-04 ENCOUNTER — Ambulatory Visit: Payer: Medicaid Other | Admitting: Family Medicine

## 2019-10-06 ENCOUNTER — Ambulatory Visit: Payer: Medicaid Other | Admitting: Allergy and Immunology

## 2019-10-13 ENCOUNTER — Other Ambulatory Visit: Payer: Medicaid Other

## 2019-10-14 ENCOUNTER — Ambulatory Visit: Payer: Medicaid Other | Admitting: Allergy and Immunology

## 2019-10-21 ENCOUNTER — Other Ambulatory Visit: Payer: Medicaid Other

## 2019-10-21 ENCOUNTER — Encounter: Payer: Self-pay | Admitting: Family Medicine

## 2019-10-21 ENCOUNTER — Encounter: Payer: Medicaid Other | Attending: Physical Medicine & Rehabilitation | Admitting: Physical Medicine & Rehabilitation

## 2019-10-21 DIAGNOSIS — M533 Sacrococcygeal disorders, not elsewhere classified: Secondary | ICD-10-CM | POA: Insufficient documentation

## 2019-10-21 DIAGNOSIS — G8929 Other chronic pain: Secondary | ICD-10-CM | POA: Insufficient documentation

## 2019-10-27 ENCOUNTER — Other Ambulatory Visit: Payer: Self-pay

## 2019-10-27 ENCOUNTER — Ambulatory Visit: Payer: Medicaid Other | Admitting: Family Medicine

## 2019-10-27 ENCOUNTER — Encounter: Payer: Self-pay | Admitting: Family Medicine

## 2019-10-27 ENCOUNTER — Ambulatory Visit: Payer: Medicaid Other | Attending: Family Medicine

## 2019-10-27 DIAGNOSIS — Z1159 Encounter for screening for other viral diseases: Secondary | ICD-10-CM

## 2019-10-27 DIAGNOSIS — E1149 Type 2 diabetes mellitus with other diabetic neurological complication: Secondary | ICD-10-CM

## 2019-10-29 ENCOUNTER — Other Ambulatory Visit: Payer: Self-pay | Admitting: Family Medicine

## 2019-10-29 DIAGNOSIS — E039 Hypothyroidism, unspecified: Secondary | ICD-10-CM

## 2019-10-29 LAB — BASIC METABOLIC PANEL
BUN/Creatinine Ratio: 8 — ABNORMAL LOW (ref 9–23)
BUN: 7 mg/dL (ref 6–24)
CO2: 22 mmol/L (ref 20–29)
Calcium: 9.1 mg/dL (ref 8.7–10.2)
Chloride: 104 mmol/L (ref 96–106)
Creatinine, Ser: 0.84 mg/dL (ref 0.57–1.00)
GFR calc Af Amer: 93 mL/min/{1.73_m2} (ref >59)
GFR calc non Af Amer: 81 mL/min/{1.73_m2} (ref >59)
Glucose: 110 mg/dL — ABNORMAL HIGH (ref 65–99)
Potassium: 4.1 mmol/L (ref 3.5–5.2)
Sodium: 141 mmol/L (ref 134–144)

## 2019-10-29 LAB — HCV RNA QUANT RFLX ULTRA OR GENOTYP: HCV Quant Baseline: NOT DETECTED IU/mL

## 2019-10-29 LAB — HEMOGLOBIN A1C
Est. average glucose Bld gHb Est-mCnc: 131 mg/dL
Hgb A1c MFr Bld: 6.2 % — ABNORMAL HIGH (ref 4.8–5.6)

## 2019-10-30 LAB — T4, FREE: Free T4: 1.19 ng/dL (ref 0.82–1.77)

## 2019-10-30 LAB — SPECIMEN STATUS REPORT

## 2019-10-30 LAB — TSH: TSH: 0.929 u[IU]/mL (ref 0.450–4.500)

## 2019-11-01 ENCOUNTER — Other Ambulatory Visit: Payer: Self-pay | Admitting: Family Medicine

## 2019-11-01 DIAGNOSIS — E039 Hypothyroidism, unspecified: Secondary | ICD-10-CM

## 2019-11-01 MED ORDER — LEVOTHYROXINE SODIUM 137 MCG PO TABS: 137.0000 ug | ORAL_TABLET | Freq: Every day | ORAL | 3 refills | Status: DC

## 2019-11-03 ENCOUNTER — Ambulatory Visit: Payer: Medicaid Other | Admitting: Allergy and Immunology

## 2019-11-05 ENCOUNTER — Encounter: Payer: Self-pay | Admitting: Family Medicine

## 2019-11-05 ENCOUNTER — Encounter: Payer: Medicaid Other | Admitting: Physical Medicine & Rehabilitation

## 2019-11-08 ENCOUNTER — Ambulatory Visit: Payer: Medicaid Other | Admitting: Family Medicine

## 2019-11-10 ENCOUNTER — Encounter: Payer: Self-pay | Admitting: Allergy and Immunology

## 2019-11-10 ENCOUNTER — Ambulatory Visit (INDEPENDENT_AMBULATORY_CARE_PROVIDER_SITE_OTHER): Payer: Medicaid Other | Admitting: Allergy and Immunology

## 2019-11-10 ENCOUNTER — Other Ambulatory Visit: Payer: Self-pay

## 2019-11-10 VITALS — BP 130/62 | HR 82 | Resp 18

## 2019-11-10 DIAGNOSIS — J309 Allergic rhinitis, unspecified: Secondary | ICD-10-CM

## 2019-11-10 DIAGNOSIS — E662 Morbid (severe) obesity with alveolar hypoventilation: Secondary | ICD-10-CM | POA: Diagnosis not present

## 2019-11-10 DIAGNOSIS — J3089 Other allergic rhinitis: Secondary | ICD-10-CM | POA: Diagnosis not present

## 2019-11-10 DIAGNOSIS — J4550 Severe persistent asthma, uncomplicated: Secondary | ICD-10-CM | POA: Diagnosis not present

## 2019-11-10 DIAGNOSIS — J324 Chronic pansinusitis: Secondary | ICD-10-CM

## 2019-11-10 DIAGNOSIS — K219 Gastro-esophageal reflux disease without esophagitis: Secondary | ICD-10-CM

## 2019-11-10 NOTE — Progress Notes (Signed)
[facility] [place] [facility] [place]   Follow-up Note  Referring Provider: Charlott Rakes, MD Primary Provider: Charlott Rakes, MD Date of Office Visit: 11/10/2019  Subjective:   Janet [name] (DOB: 05/21/1970) is a 49 y.o. female who returns to the [facility] on 11/10/2019 in re-evaluation of the following:  HPI: Janet [name] presents to this clinic in evaluation of severe asthma, allergic rhinitis, chronic sinusitis treated with dupilumab and a history of suspected obesity hypoventilation syndrome and exercise-induced hypoxemia and a history of LPR.  Her last visit to this clinic was 05 Jul 2019.  Once again her airway has done excellent as long as she continues to use her dupilumab and her other anti-inflammatory agents.  Currently she has been using Symbicort only 1 time per day and has not been using any Flonase and rarely uses a short acting bronchodilator and has not required a systemic steroid or antibiotic for any type of airway issue.  Overall she feels excellent regarding her airway.  During her last visit we established that she no longer requires oxygen when she exerts herself and she has continued to do quite well with exertion.  She has had no issues with reflux at this point on her current therapy.  She is scheduled to have a echocardiogram performed tomorrow in investigation of her murmur in anticipation of having her undergo gastric bypass surgery.  She continues on immunotherapy currently at every 8-week without any adverse effect.  Allergies as of 11/10/2019      Reactions   Ciprofloxacin Shortness Of Breath   Shrimp [shellfish Allergy] Other (See Comments)   positive allergy test      Medication List      albuterol (2.5 MG/3ML) 0.083% nebulizer solution Commonly known as: PROVENTIL Take 3 mLs (2.5 mg total) by nebulization every 6 (six) hours as needed for wheezing or shortness of breath.   albuterol 108 (90 Base)  MCG/ACT inhaler Commonly known as: VENTOLIN HFA Inhale 1-2 puffs into the lungs every 6 (six) hours as needed for wheezing or shortness of breath.   atorvastatin 20 MG tablet Commonly known as: LIPITOR Take 1 tablet (20 mg total) by mouth daily.   budesonide-formoterol 160-4.5 MCG/ACT inhaler Commonly known as: Symbicort Inhale 2 puffs into the lungs 2 (two) times daily.   cholecalciferol 1000 units tablet Commonly known as: VITAMIN D Take 1,000 Units by mouth at bedtime.   cyclobenzaprine 5 MG tablet Commonly known as: FLEXERIL Take 1 tablet (5 mg total) by mouth 2 (two) times daily.   diazepam 5 MG tablet Commonly known as: Valium Take 1 tablet (5 mg total) by mouth every 6 (six) hours as needed for anxiety.   DULoxetine 60 MG capsule Commonly known as: Cymbalta Take 1 capsule (60 mg total) by mouth daily.   dupilumab 200 MG/1.14ML prefilled syringe Commonly known as: DUPIXENT Inject into the skin.   EPINEPHrine 0.3 mg/0.3 mL Soaj injection Commonly known as: EPI-PEN Use as directed for life threatening allergic reactions   EQ Allergy Relief (Cetirizine) 10 MG tablet Generic drug: cetirizine Take 1 tablet by mouth once daily   fluticasone 50 MCG/ACT nasal spray Commonly known as: FLONASE Place 2 sprays into both nostrils daily.   furosemide 20 MG tablet Commonly known as: LASIX Take 1 tablet (20 mg total) by mouth daily as needed.   glipiZIDE 10 MG tablet Commonly known as: GLUCOTROL Take 1 tablet (10 mg total) by mouth 2 (two) times daily before a  meal.   levothyroxine 137 MCG tablet Commonly known as: SYNTHROID Take 1 tablet (137 mcg total) by mouth daily before breakfast.   losartan 50 MG tablet Commonly known as: COZAAR TAKE 1 TABLET BY MOUTH ONCE DAILY.   metFORMIN 500 MG tablet Commonly known as: GLUCOPHAGE Take 2 tablets (1,000 mg total) by mouth 2 (two) times daily with a meal.   montelukast 10 MG tablet Commonly known as: SINGULAIR Take 1  tablet (10 mg total) by mouth at bedtime.   multivitamin tablet Take 1 tablet by mouth daily.   omeprazole 40 MG capsule Commonly known as: PRILOSEC Take 1 capsule (40 mg total) by mouth daily.   OXcarbazepine 150 MG tablet Commonly known as: TRILEPTAL Take 1 tablet by mouth twice daily   pregabalin 75 MG capsule Commonly known as: LYRICA Take 1 capsule by mouth twice daily   triamcinolone cream 0.1 % Commonly known as: KENALOG Apply 1 application topically 2 (two) times daily.   TRUEplus Pen Needles 32G X 4 MM Misc Generic drug: Insulin Pen Needle Use to inject Victoza daily.   Victoza 18 MG/3ML Sopn Generic drug: liraglutide Inject 0.3 mLs (1.8 mg total) into the skin daily.       Past Medical History:  Diagnosis Date  . Anemia   . Angio-edema   . Anxiety   . Asthma   . Depression   . Hypothyroidism   . Pre-diabetes   . Right foot drop   . Sleep apnea   . Thyroid disease   . Urticaria   . Uterine cancer [facility])     Past Surgical History:  Procedure Laterality Date  . ABDOMINAL HYSTERECTOMY    . CARPAL TUNNEL RELEASE    . CESAREAN SECTION    . CHOLECYSTECTOMY    . cyst removal     uterus  . DILATION AND CURETTAGE OF UTERUS     still birth  . DILATION AND CURETTAGE OF UTERUS N/A 06/11/2016   Procedure: DILATATION AND CURETTAGE;  Surgeon: Emily Filbert, MD;  Location: [facility];  Service: Gynecology;  Laterality: N/A;  . ROBOTIC ASSISTED TOTAL HYSTERECTOMY WITH BILATERAL SALPINGO OOPHERECTOMY Bilateral 07/09/2016   Procedure: XI ROBOTIC ASSISTED TOTAL HYSTERECTOMY WITH BILATERAL SALPINGO OOPHORECTOMY FOR UTERUS GREATER THAN 250 GRAMS, LYSIS OF ADHESIONS;  Surgeon: Everitt Amber, MD;  Location: [facility];  Service: Gynecology;  Laterality: Bilateral;  . SINOSCOPY    . SINUS EXPLORATION    . WISDOM TOOTH EXTRACTION      Review of systems negative except as noted in HPI / PMHx or noted below:  Review of Systems  Constitutional: Negative.   HENT: Negative.   Eyes:  Negative.   Respiratory: Negative.   Cardiovascular: Negative.   Gastrointestinal: Negative.   Genitourinary: Negative.   Musculoskeletal: Negative.   Skin: Negative.   Neurological: Negative.   Endo/Heme/Allergies: Negative.   Psychiatric/Behavioral: Negative.      Objective:   Vitals:   11/10/19 0825  BP: 130/62  [name]: 82  Resp: 18  SpO2: 97%          Physical Exam Constitutional:      Appearance: She is not diaphoretic.  HENT:     Head: Normocephalic.     Right Ear: Tympanic membrane, ear canal and external ear normal.     Left Ear: Tympanic membrane, ear canal and external ear normal.     Nose: Nose normal. No mucosal edema or rhinorrhea.     Mouth/Throat:     Pharynx: Uvula midline.  No oropharyngeal exudate.  Eyes:     Conjunctiva/sclera: Conjunctivae normal.  Neck:     Thyroid: No thyromegaly.     Trachea: Trachea normal. No tracheal tenderness or tracheal deviation.  Cardiovascular:     Rate and Rhythm: Normal rate and regular rhythm.     Heart sounds: S1 normal and S2 normal. Murmur (Systolic) heard.   Pulmonary:     Effort: No respiratory distress.     Breath sounds: Normal breath sounds. No stridor. No wheezing or rales.  Lymphadenopathy:     Head:     Right side of head: No tonsillar adenopathy.     Left side of head: No tonsillar adenopathy.     Cervical: No cervical adenopathy.  Skin:    Findings: No erythema or rash.     Nails: There is no clubbing.  Neurological:     Mental Status: She is alert.     Diagnostics:    Spirometry was performed and demonstrated an FEV1 of 2.92 at 95 % of predicted.  Assessment and Plan:   1. Asthma, severe persistent, well-controlled   2. Obesity hypoventilation syndrome (HCC)   3. Other allergic rhinitis   4. Chronic pansinusitis   5. LPRD (laryngopharyngeal reflux disease)     1.  Continue to treat and inflammation:   A.  Symbicort 160 - 2 inhalations 1-2 times per day  B.  Flonase - 1 spray  each nostril 1-2 times per day  C.  Montelukast 10 mg - 1 tablet 1 time per day  D.  Dupilumab injections every 2 weeks  E.  Immunotherapy  2.  Continue to treat reflux:   A. Omeprazole 40 mg - 1 tablet 1 time per day  3. Consider COVID vaccine and fall flu vaccine  4. Return to clinic in 6 months or earlier if problem  Janet [name] appears to be doing very well on her current plan and she has a good understanding of her medications and the appropriate dosing of her medications depending on disease activity.  It is really the administration of dupilumab that has given her most benefit.  She will continue on the plan noted above and we will see her back in this clinic in 6 months or earlier if there is a problem.  Allena Katz, MD Allergy / Immunology [place]

## 2019-11-10 NOTE — Patient Instructions (Signed)
  1.  Continue to treat and inflammation:   A.  Symbicort 160 - 2 inhalations 1-2 times per day  B.  Flonase - 1 spray each nostril 1-2 times per day  C.  Montelukast 10 mg - 1 tablet 1 time per day  D.  Dupilumab injections every 2 weeks  E.  Immunotherapy  2.  Continue to treat reflux:   A. Omeprazole 40 mg - 1 tablet 1 time per day  3. Consider COVID vaccine and fall flu vaccine  4. Return to clinic in 6 months or earlier if problem

## 2019-11-11 ENCOUNTER — Encounter: Payer: Self-pay | Admitting: Allergy and Immunology

## 2019-11-11 DIAGNOSIS — I350 Nonrheumatic aortic (valve) stenosis: Secondary | ICD-10-CM | POA: Insufficient documentation

## 2019-11-12 ENCOUNTER — Encounter: Payer: Medicaid Other | Admitting: Physical Medicine & Rehabilitation

## 2019-11-18 ENCOUNTER — Ambulatory Visit (INDEPENDENT_AMBULATORY_CARE_PROVIDER_SITE_OTHER): Payer: Medicaid Other

## 2019-11-18 DIAGNOSIS — J309 Allergic rhinitis, unspecified: Secondary | ICD-10-CM | POA: Diagnosis not present

## 2019-11-30 ENCOUNTER — Other Ambulatory Visit: Payer: Self-pay | Admitting: *Deleted

## 2019-11-30 MED ORDER — DUPIXENT 300 MG/2ML ~~LOC~~ SOSY: 300.0000 mg | PREFILLED_SYRINGE | SUBCUTANEOUS | 11 refills | Status: DC

## 2019-12-01 ENCOUNTER — Ambulatory Visit (INDEPENDENT_AMBULATORY_CARE_PROVIDER_SITE_OTHER): Payer: Medicaid Other

## 2019-12-01 DIAGNOSIS — J309 Allergic rhinitis, unspecified: Secondary | ICD-10-CM | POA: Diagnosis not present

## 2019-12-02 ENCOUNTER — Encounter: Payer: Medicaid Other | Admitting: Physical Medicine & Rehabilitation

## 2019-12-13 ENCOUNTER — Other Ambulatory Visit: Payer: Self-pay | Admitting: *Deleted

## 2019-12-13 MED ORDER — DUPIXENT 300 MG/2ML ~~LOC~~ SOSY: 300.0000 mg | PREFILLED_SYRINGE | SUBCUTANEOUS | 11 refills | Status: AC

## 2019-12-16 ENCOUNTER — Ambulatory Visit (INDEPENDENT_AMBULATORY_CARE_PROVIDER_SITE_OTHER): Payer: Medicaid Other | Admitting: *Deleted

## 2019-12-16 DIAGNOSIS — J309 Allergic rhinitis, unspecified: Secondary | ICD-10-CM

## 2019-12-24 ENCOUNTER — Other Ambulatory Visit: Payer: Self-pay | Admitting: Family Medicine

## 2020-01-04 ENCOUNTER — Encounter: Payer: Self-pay | Admitting: Family Medicine

## 2020-01-04 ENCOUNTER — Ambulatory Visit (INDEPENDENT_AMBULATORY_CARE_PROVIDER_SITE_OTHER): Payer: Medicaid Other

## 2020-01-04 DIAGNOSIS — J309 Allergic rhinitis, unspecified: Secondary | ICD-10-CM | POA: Diagnosis not present

## 2020-01-10 ENCOUNTER — Other Ambulatory Visit: Payer: Self-pay | Admitting: Neurology

## 2020-01-10 ENCOUNTER — Encounter: Payer: Self-pay | Admitting: Neurology

## 2020-01-10 ENCOUNTER — Ambulatory Visit: Payer: Medicaid Other | Admitting: Neurology

## 2020-01-10 ENCOUNTER — Other Ambulatory Visit: Payer: Self-pay | Admitting: Allergy and Immunology

## 2020-01-10 DIAGNOSIS — F330 Major depressive disorder, recurrent, mild: Secondary | ICD-10-CM

## 2020-01-10 NOTE — Progress Notes (Deleted)
PATIENT: Janet [name] DOB: August 03, 1970  REASON FOR VISIT: follow up HISTORY FROM: patient  HISTORY OF PRESENT ILLNESS: Today 01/10/20  HISTORY  Janet [name], is a 49 year old female, seen in refer by her primary care doctor Arnoldo Morale, for evaluation of right foot drop, initial evaluation was on November twelfth 2018.  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  She noticed that the top of right toe went numb since April 2018, she described right toe numbness tingling, radiating paresthesia to the top of right foot, right lateral leg, also complains of low back pain, radiating pain to her right leg, getting worse after standing up since July 2018, she denies bowel and bladder incontinence, no left leg involvement,  I personally reviewed x-ray in October 2018, degenerative changes of lumbar spine with scoliosis concave to the left, no acute abnormality,  Laboratory evaluation in October 2018, glucose was 105, creatinine 0.7, lipid profile, cholesterol 131, LDL 50, normal CMP with creatinine of 0.69, decreased TSH 0.17, A1c 6.2, uric acid 4.7, CPK 78  Update April 17, 2017: MRI of lumbar in December 2018 showed mild degenerative changes, no significant stenosis, or  nerve root compression  MRI of right thigh showed no significant abnormality, no evidence of nerve damage.  PET scan October 2018 no evidence of hypermetabolic recurrent, metastatic disease, retroperitoneal adenopathy persistent, remains suspicious based on interval progression since May 2018  CT abdomen in February 2019, similar appearing aortocaval lymph nodes when compared to recent scan, metastatic disease to this location is not excluded, recommended follow-up scan, Hepatic cirrhosis, portal vein hypertension with splenomegaly,upper abdominal collateral vessels.  EMG nerve conduction study noticed significant active denervation at right tibialis anterior, peroneal longus, mild involvement of right biceps femoris short head. With well preserved bilateral sural, and superficial peroneal sensory potentials, which raised the possibility of right sciatic neuropathy versus right lumbosacral radiculopathy.  She reported 25% improved, right foot is getting stronger, she still has low back pain, peritoneal pain.  UPDATE Sept 11 2019: She is still have significant right distal leg weakness, wearright ankle brace for long distance, taking Trileptal 150 mg twice a day, Cymbalta 60 mg twice a day, run out of Lyrica 75 twice a day, realize it has helped her right lower extremity deep achy pain,  she will havefollow-up appointment with her oncologist Dr. Denman George in November, and planning will have a repeat CT abdomen in November 2019,   She is planning on to have bariatric surgery for weight loss in October 2019  Update January 06, 2019 SS: She continues to complain of numbness to the right foot, but the medications help to manage symptoms.  She remains on Trileptal 150 mg twice a day, Cymbalta 60 mg twice a day, Lyrica 75 mg twice a day.  She notices if she misses a dose of the medicine, she has significant numbness, achy pain to her right  lower extremity.  She has not been consistent to wear  her AFO brace for her right foot drop.  She suffered a fall on Monday, while bringing the trash can in.  She is sore from the fall.  The fall served as a reminder to her to wear her brace.  She continues to follow with oncology for history of uterine cancer, thus far has done well.  She has chronic low platelets.  She is receiving injections in her back for chronic back pain.  She was approved for disability this year.  She lives at home with her 61 year old child, and drives a car.  She is seeing an allergy doctor for chronic respiratory issues, is now on prednisone.  She was planning to have bariatric surgery, but this got delayed due to the COVID-19 pandemic.  She presents today for follow-up unaccompanied.  Update 01/10/2020 SS:   REVIEW OF SYSTEMS: Out of a complete 14 system review of symptoms, the patient complains only of the following symptoms, and all other reviewed systems are negative.  ALLERGIES: Allergies  Allergen Reactions  . Ciprofloxacin Shortness Of Breath  . Shrimp [Shellfish Allergy] Other (See Comments)    positive allergy test    HOME MEDICATIONS: Outpatient Medications Prior to Visit  Medication Sig Dispense Refill  . albuterol (PROVENTIL) (2.5 MG/3ML) 0.083% nebulizer solution Take 3 mLs (2.5 mg total) by nebulization every 6 (six) hours as needed for wheezing or shortness of breath. 75 mL 3  . albuterol (VENTOLIN HFA) 108 (90 Base) MCG/ACT inhaler Inhale 1-2 puffs into the lungs every 6 (six) hours as needed for wheezing or shortness of breath. 54 g 3  . atorvastatin (LIPITOR) 20 MG tablet Take 1 tablet (20 mg total) by mouth daily. 90 tablet 1  . BD PEN NEEDLE NANO 2ND GEN 32G X 4 MM MISC USE 1  ONCE DAILY TO  INJECT  VICTOZA 100 each 0  . budesonide-formoterol (SYMBICORT) 160-4.5 MCG/ACT inhaler Inhale 2 puffs into the lungs 2 (two) times daily. 1 Inhaler 5  . cholecalciferol (VITAMIN D) 1000 units tablet Take 1,000 Units by mouth at bedtime.    . cyclobenzaprine  (FLEXERIL) 5 MG tablet Take 1 tablet (5 mg total) by mouth 2 (two) times daily. 30 tablet 0  . diazepam (VALIUM) 5 MG tablet Take 1 tablet (5 mg total) by mouth every 6 (six) hours as needed for anxiety. (Patient not taking: Reported on 09/28/2019) 2 tablet 0  . DULoxetine (CYMBALTA) 60 MG capsule Take 1 capsule (60 mg total) by mouth daily. 90 capsule 4  . dupilumab (DUPIXENT) 300 MG/2ML prefilled syringe Inject 300 mg into the skin every 14 (fourteen) days. 4 mL 11  . EPINEPHrine 0.3 mg/0.3 mL IJ SOAJ injection Use as directed for life threatening allergic reactions 2 each 3  . EQ ALLERGY RELIEF, CETIRIZINE, 10 MG tablet Take 1 tablet by mouth once daily 90 tablet 0  . fluticasone (FLONASE) 50 MCG/ACT nasal spray Place 2 sprays into both nostrils daily. 16 g 6  . furosemide (LASIX) 20 MG tablet Take 1 tablet (20 mg total) by mouth daily as needed. 30 tablet 0  . glipiZIDE (GLUCOTROL) 10 MG tablet Take 1 tablet (10 mg total) by mouth 2 (two) times daily before a meal. 180 tablet 1  . levothyroxine (SYNTHROID) 137 MCG tablet Take 1 tablet (137 mcg total) by mouth daily before breakfast. 30 tablet 3  . liraglutide (VICTOZA) 18 MG/3ML SOPN Inject 0.3 mLs (1.8 mg total) into the  skin daily. 30 mL 6  . losartan (COZAAR) 50 MG tablet TAKE 1 TABLET BY MOUTH ONCE DAILY. 90 tablet 1  . metFORMIN (GLUCOPHAGE) 500 MG tablet Take 2 tablets (1,000 mg total) by mouth 2 (two) times daily with a meal. 360 tablet 1  . montelukast (SINGULAIR) 10 MG tablet Take 1 tablet (10 mg total) by mouth at bedtime. 90 tablet 1  . Multiple Vitamin (MULTIVITAMIN) tablet Take 1 tablet by mouth daily.    Marland Kitchen omeprazole (PRILOSEC) 40 MG capsule Take 1 capsule (40 mg total) by mouth daily. 90 capsule 1  . OXcarbazepine (TRILEPTAL) 150 MG tablet Take 1 tablet by mouth twice daily 180 tablet 1  . pregabalin (LYRICA) 75 MG capsule Take 1 capsule by mouth twice daily 60 capsule 5  . triamcinolone cream (KENALOG) 0.1 % Apply 1 application  topically 2 (two) times daily. 80 g 0   No facility-administered medications prior to visit.    PAST MEDICAL HISTORY: Past Medical History:  Diagnosis Date  . Anemia   . Angio-edema   . Anxiety   . Asthma   . Depression   . Hypothyroidism   . Pre-diabetes   . Right foot drop   . Sleep apnea   . Thyroid disease   . Urticaria   . Uterine cancer ([place])     PAST SURGICAL HISTORY: Past Surgical History:  Procedure Laterality Date  . ABDOMINAL HYSTERECTOMY    . CARPAL TUNNEL RELEASE    . CESAREAN SECTION    . CHOLECYSTECTOMY    . cyst removal     uterus  . DILATION AND CURETTAGE OF UTERUS     still birth  . DILATION AND CURETTAGE OF UTERUS N/A 06/11/2016   Procedure: DILATATION AND CURETTAGE;  Surgeon: Emily Filbert, MD;  Location: [facility];  Service: Gynecology;  Laterality: N/A;  . ROBOTIC ASSISTED TOTAL HYSTERECTOMY WITH BILATERAL SALPINGO OOPHERECTOMY Bilateral 07/09/2016   Procedure: XI ROBOTIC ASSISTED TOTAL HYSTERECTOMY WITH BILATERAL SALPINGO OOPHORECTOMY FOR UTERUS GREATER THAN 250 GRAMS, LYSIS OF ADHESIONS;  Surgeon: Everitt Amber, MD;  Location: [facility];  Service: Gynecology;  Laterality: Bilateral;  . SINOSCOPY    . SINUS EXPLORATION    . WISDOM TOOTH EXTRACTION      FAMILY HISTORY: Family History  Problem Relation Age of Onset  . Heart attack Mother   . Allergic rhinitis Mother   . Suicidality Father     SOCIAL HISTORY: Social History   Socioeconomic History  . Marital status: Widowed    Spouse name: Not on file  . Number of children: 1  . Years of education: college  . Highest education level: Not on file  Occupational History  . Occupation: Unemployed  Tobacco Use  . Smoking status: Never Smoker  . Smokeless tobacco: Never Used  Vaping Use  . Vaping Use: Never used  Substance and Sexual Activity  . Alcohol use: No  . Drug use: No  . Sexual activity: Not Currently    Birth control/protection: None  Other Topics Concern  . Not on file  Social  History Narrative   Lives at home with her son.   Right-handed.   2 cups caffeine per day.   Social Determinants of Health   Financial Resource Strain:   . Difficulty of Paying Living Expenses: Not on file  Food Insecurity:   . Worried About Charity fundraiser in the Last Year: Not on file  . Ran Out of Food in the Last Year: Not on  file  Transportation Needs:   . Film/video editor (Medical): Not on file  . Lack of Transportation (Non-Medical): Not on file  Physical Activity:   . Days of Exercise per Week: Not on file  . Minutes of Exercise per Session: Not on file  Stress:   . Feeling of Stress : Not on file  Social Connections:   . Frequency of Communication with Friends and Family: Not on file  . Frequency of Social Gatherings with Friends and Family: Not on file  . Attends Religious Services: Not on file  . Active Member of Clubs or Organizations: Not on file  . Attends Archivist Meetings: Not on file  . Marital Status: Not on file  Intimate Partner Violence:   . Fear of Current or Ex-Partner: Not on file  . Emotionally Abused: Not on file  . Physically Abused: Not on file  . Sexually Abused: Not on file      PHYSICAL EXAM  There were no vitals filed for this visit. There is no height or weight on file to calculate BMI.  Generalized: Well developed, in no acute distress   Neurological examination  Mentation: Alert oriented to time, place, history taking. Follows all commands speech and language fluent Cranial nerve II-XII: Pupils were equal round reactive to light. Extraocular movements were full, visual field were full on confrontational test. Facial sensation and strength were normal. Uvula tongue midline. Head turning and shoulder shrug  were normal and symmetric. Motor: The motor testing reveals 5 over 5 strength of all 4 extremities. Good symmetric motor tone is noted throughout.  Sensory: Sensory testing is intact to soft touch on all 4  extremities. No evidence of extinction is noted.  Coordination: Cerebellar testing reveals good finger-nose-finger and heel-to-shin bilaterally.  Gait and station: Gait is normal. Tandem gait is normal. Romberg is negative. No drift is seen.  Reflexes: Deep tendon reflexes are symmetric and normal bilaterally.   DIAGNOSTIC DATA (LABS, IMAGING, TESTING) - I reviewed patient records, labs, notes, testing and imaging myself where available.  Lab Results  Component Value Date   WBC 5.7 12/08/2018   HGB 15.0 12/08/2018   HCT 43.4 12/08/2018   MCV 90 12/08/2018   PLT 84 (LL) 12/08/2018      Component Value Date/Time   NA 141 10/27/2019 1045   K 4.1 10/27/2019 1045   CL 104 10/27/2019 1045   CO2 22 10/27/2019 1045   GLUCOSE 110 (H) 10/27/2019 1045   GLUCOSE 127 (H) 12/16/2017 0841   BUN 7 10/27/2019 1045   CREATININE 0.84 10/27/2019 1045   CREATININE 0.74 12/16/2017 0841   CREATININE 0.69 12/26/2015 0929   CALCIUM 9.1 10/27/2019 1045   PROT 6.6 07/01/2019 0945   ALBUMIN 3.8 07/01/2019 0945   AST 51 (H) 07/01/2019 0945   ALT 26 07/01/2019 0945   ALKPHOS 153 (H) 07/01/2019 0945   BILITOT 1.5 (H) 07/01/2019 0945   GFRNONAA 81 10/27/2019 1045   GFRNONAA >60 12/16/2017 0841   GFRNONAA >89 12/26/2015 0929   GFRAA 93 10/27/2019 1045   GFRAA >60 12/16/2017 0841   GFRAA >89 12/26/2015 0929   Lab Results  Component Value Date   CHOL 101 07/01/2019   HDL 57 07/01/2019   LDLCALC 29 07/01/2019   TRIG 70 07/01/2019   CHOLHDL 1.8 07/01/2019   Lab Results  Component Value Date   HGBA1C 6.2 (H) 10/27/2019   No results found for: [id number] Lab Results  Component Value Date   TSH  0.929 10/27/2019      ASSESSMENT AND PLAN 49 y.o. year old female  has a past medical history of Anemia, Angio-edema, Anxiety, Asthma, Depression, Hypothyroidism, Pre-diabetes, Right foot drop, Sleep apnea, Thyroid disease, Urticaria, and Uterine cancer ([place]). here with:  1.  Right foot drop, sensory  loss of right superficial peroneal and deep peroneal distribution -Right common peroneal neuropathy -MRI of the lumbar spine showed no significant abnormality -MRI of the right thigh showed no significant abnormality -Continue Trileptal 150 mg twice a day -Continue Cymbalta 60 mg daily -Continue Lyrica 75 mg twice a day   I spent 15 minutes with the patient. 50% of this time was spent   Butler Denmark, [place], DNP 01/10/2020, 5:38 AM [facility] [address] [place], [place] [address] [phone]

## 2020-01-20 ENCOUNTER — Ambulatory Visit (INDEPENDENT_AMBULATORY_CARE_PROVIDER_SITE_OTHER): Payer: Medicaid Other | Admitting: *Deleted

## 2020-01-20 DIAGNOSIS — J309 Allergic rhinitis, unspecified: Secondary | ICD-10-CM | POA: Diagnosis not present

## 2020-01-24 ENCOUNTER — Ambulatory Visit: Payer: Medicaid Other | Admitting: Neurology

## 2020-01-24 NOTE — Progress Notes (Deleted)
PATIENT: Janet [name] DOB: 1970-10-11  REASON FOR VISIT: follow up HISTORY FROM: patient  HISTORY OF PRESENT ILLNESS: Today 01/24/20  HISTORY  Janet [name], is a 49 year old female, seen in refer by her primary care doctor Arnoldo Morale, for evaluation of right foot drop, initial evaluation was on November twelfth 2018.  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  She noticed that the top of right toe went numb since April 2018, she described right toe numbness tingling, radiating paresthesia to the top of right foot, right lateral leg, also complains of low back pain, radiating pain to her right leg, getting worse after standing up since July 2018, she denies bowel and bladder incontinence, no left leg involvement,  I personally reviewed x-ray in October 2018, degenerative changes of lumbar spine with scoliosis concave to the left, no acute abnormality,  Laboratory evaluation in October 2018, glucose was 105, creatinine 0.7, lipid profile, cholesterol 131, LDL 50, normal CMP with creatinine of 0.69, decreased TSH 0.17, A1c 6.2, uric acid 4.7, CPK 78  Update April 17, 2017: MRI of lumbar in December 2018 showed mild degenerative changes, no significant stenosis, or  nerve root compression  MRI of right thigh showed no significant abnormality, no evidence of nerve damage.  PET scan October 2018 no evidence of hypermetabolic recurrent, metastatic disease, retroperitoneal adenopathy persistent, remains suspicious based on interval progression since May 2018  CT abdomen in February 2019, similar appearing aortocaval lymph nodes when compared to recent scan, metastatic disease to this location is not excluded, recommended follow-up scan, Hepatic cirrhosis, portal vein hypertension with splenomegaly,upper abdominal collateral vessels.  EMG nerve conduction study noticed significant active denervation at right tibialis anterior, peroneal longus, mild involvement of right biceps femoris short head. With well preserved bilateral sural, and superficial peroneal sensory potentials, which raised the possibility of right sciatic neuropathy versus right lumbosacral radiculopathy.  She reported 25% improved, right foot is getting stronger, she still has low back pain, peritoneal pain.  UPDATE Sept 11 2019: She is still have significant right distal leg weakness, wearright ankle brace for long distance, taking Trileptal 150 mg twice a day, Cymbalta 60 mg twice a day, run out of Lyrica 75 twice a day, realize it has helped her right lower extremity deep achy pain,  she will havefollow-up appointment with her oncologist Dr. Denman George in November, and planning will have a repeat CT abdomen in November 2019,   She is planning on to have bariatric surgery for weight loss in October 2019  Update January 06, 2019 SS: She continues to complain of numbness to the right foot, but the medications help to manage symptoms.  She remains on Trileptal 150 mg twice a day, Cymbalta 60 mg twice a day, Lyrica 75 mg twice a day.  She notices if she misses a dose of the medicine, she has significant numbness, achy pain to her right  lower extremity.  She has not been consistent to wear  her AFO brace for her right foot drop.  She suffered a fall on Monday, while bringing the trash can in.  She is sore from the fall.  The fall served as a reminder to her to wear her brace.  She continues to follow with oncology for history of uterine cancer, thus far has done well.  She has chronic low platelets.  She is receiving injections in her back for chronic back pain.  She was approved for disability this year.  She lives at home with her 46 year old child, and drives a car.  She is seeing an allergy doctor for chronic respiratory issues, is now on prednisone.  She was planning to have bariatric surgery, but this got delayed due to the COVID-19 pandemic.  She presents today for follow-up unaccompanied.  Update January 24, 2020 SS:   REVIEW OF SYSTEMS: Out of a complete 14 system review of symptoms, the patient complains only of the following symptoms, and all other reviewed systems are negative.  ALLERGIES: Allergies  Allergen Reactions  . Ciprofloxacin Shortness Of Breath  . Shrimp [Shellfish Allergy] Other (See Comments)    positive allergy test    HOME MEDICATIONS: Outpatient Medications Prior to Visit  Medication Sig Dispense Refill  . albuterol (PROVENTIL) (2.5 MG/3ML) 0.083% nebulizer solution Take 3 mLs (2.5 mg total) by nebulization every 6 (six) hours as needed for wheezing or shortness of breath. 75 mL 3  . albuterol (VENTOLIN HFA) 108 (90 Base) MCG/ACT inhaler Inhale 1-2 puffs into the lungs every 6 (six) hours as needed for wheezing or shortness of breath. 54 g 3  . atorvastatin (LIPITOR) 20 MG tablet Take 1 tablet (20 mg total) by mouth daily. 90 tablet 1  . BD PEN NEEDLE NANO 2ND GEN 32G X 4 MM MISC USE 1  ONCE DAILY TO  INJECT  VICTOZA 100 each 0  . budesonide-formoterol (SYMBICORT) 160-4.5 MCG/ACT inhaler Inhale 2 puffs into the lungs 2 (two) times daily. 1 Inhaler 5  . cholecalciferol (VITAMIN D) 1000 units tablet Take 1,000 Units by mouth at bedtime.    .  cyclobenzaprine (FLEXERIL) 5 MG tablet Take 1 tablet (5 mg total) by mouth 2 (two) times daily. 30 tablet 0  . diazepam (VALIUM) 5 MG tablet Take 1 tablet (5 mg total) by mouth every 6 (six) hours as needed for anxiety. (Patient not taking: Reported on 09/28/2019) 2 tablet 0  . DULoxetine (CYMBALTA) 60 MG capsule Take 1 capsule by mouth once daily 90 capsule 0  . dupilumab (DUPIXENT) 300 MG/2ML prefilled syringe Inject 300 mg into the skin every 14 (fourteen) days. 4 mL 11  . EPINEPHrine 0.3 mg/0.3 mL IJ SOAJ injection Use as directed for life threatening allergic reactions 2 each 3  . EQ ALLERGY RELIEF, CETIRIZINE, 10 MG tablet Take 1 tablet by mouth once daily 90 tablet 0  . fluticasone (FLONASE) 50 MCG/ACT nasal spray Place 2 sprays into both nostrils daily. 16 g 6  . furosemide (LASIX) 20 MG tablet Take 1 tablet (20 mg total) by mouth daily as needed. 30 tablet 0  . glipiZIDE (GLUCOTROL) 10 MG tablet Take 1 tablet (10 mg total) by mouth 2 (two) times daily before a meal. 180 tablet 1  . levothyroxine (SYNTHROID) 137 MCG tablet Take 1 tablet (137 mcg total) by mouth daily before breakfast. 30 tablet 3  . liraglutide (VICTOZA) 18 MG/3ML SOPN Inject 0.3 mLs (1.8 mg total) into the  skin daily. 30 mL 6  . losartan (COZAAR) 50 MG tablet TAKE 1 TABLET BY MOUTH ONCE DAILY. 90 tablet 1  . metFORMIN (GLUCOPHAGE) 500 MG tablet Take 2 tablets (1,000 mg total) by mouth 2 (two) times daily with a meal. 360 tablet 1  . montelukast (SINGULAIR) 10 MG tablet Take 1 tablet (10 mg total) by mouth at bedtime. 90 tablet 1  . Multiple Vitamin (MULTIVITAMIN) tablet Take 1 tablet by mouth daily.    Marland Kitchen omeprazole (PRILOSEC) 40 MG capsule Take 1 capsule by mouth once daily 30 capsule 3  . OXcarbazepine (TRILEPTAL) 150 MG tablet Take 1 tablet by mouth twice daily 180 tablet 1  . pregabalin (LYRICA) 75 MG capsule Take 1 capsule by mouth twice daily 60 capsule 5  . triamcinolone cream (KENALOG) 0.1 % Apply 1 application  topically 2 (two) times daily. 80 g 0   No facility-administered medications prior to visit.    PAST MEDICAL HISTORY: Past Medical History:  Diagnosis Date  . Anemia   . Angio-edema   . Anxiety   . Asthma   . Depression   . Hypothyroidism   . Pre-diabetes   . Right foot drop   . Sleep apnea   . Thyroid disease   . Urticaria   . Uterine cancer ([place])     PAST SURGICAL HISTORY: Past Surgical History:  Procedure Laterality Date  . ABDOMINAL HYSTERECTOMY    . CARPAL TUNNEL RELEASE    . CESAREAN SECTION    . CHOLECYSTECTOMY    . cyst removal     uterus  . DILATION AND CURETTAGE OF UTERUS     still birth  . DILATION AND CURETTAGE OF UTERUS N/A 06/11/2016   Procedure: DILATATION AND CURETTAGE;  Surgeon: Emily Filbert, MD;  Location: [facility];  Service: Gynecology;  Laterality: N/A;  . ROBOTIC ASSISTED TOTAL HYSTERECTOMY WITH BILATERAL SALPINGO OOPHERECTOMY Bilateral 07/09/2016   Procedure: XI ROBOTIC ASSISTED TOTAL HYSTERECTOMY WITH BILATERAL SALPINGO OOPHORECTOMY FOR UTERUS GREATER THAN 250 GRAMS, LYSIS OF ADHESIONS;  Surgeon: Everitt Amber, MD;  Location: [facility];  Service: Gynecology;  Laterality: Bilateral;  . SINOSCOPY    . SINUS EXPLORATION    . WISDOM TOOTH EXTRACTION      FAMILY HISTORY: Family History  Problem Relation Age of Onset  . Heart attack Mother   . Allergic rhinitis Mother   . Suicidality Father     SOCIAL HISTORY: Social History   Socioeconomic History  . Marital status: Widowed    Spouse name: Not on file  . Number of children: 1  . Years of education: college  . Highest education level: Not on file  Occupational History  . Occupation: Unemployed  Tobacco Use  . Smoking status: Never Smoker  . Smokeless tobacco: Never Used  Vaping Use  . Vaping Use: Never used  Substance and Sexual Activity  . Alcohol use: No  . Drug use: No  . Sexual activity: Not Currently    Birth control/protection: None  Other Topics Concern  . Not on file  Social  History Narrative   Lives at home with her son.   Right-handed.   2 cups caffeine per day.   Social Determinants of Health   Financial Resource Strain: Not on file  Food Insecurity: Not on file  Transportation Needs: Not on file  Physical Activity: Not on file  Stress: Not on file  Social Connections: Not on file  Intimate Partner Violence: Not on file  PHYSICAL EXAM  There were no vitals filed for this visit. There is no height or weight on file to calculate BMI.  Generalized: Well developed, in no acute distress   Neurological examination  Mentation: Alert oriented to time, place, history taking. Follows all commands speech and language fluent Cranial nerve II-XII: Pupils were equal round reactive to light. Extraocular movements were full, visual field were full on confrontational test. Facial sensation and strength were normal. Uvula tongue midline. Head turning and shoulder shrug  were normal and symmetric. Motor: The motor testing reveals 5 over 5 strength of all 4 extremities. Good symmetric motor tone is noted throughout.  Sensory: Sensory testing is intact to soft touch on all 4 extremities. No evidence of extinction is noted.  Coordination: Cerebellar testing reveals good finger-nose-finger and heel-to-shin bilaterally.  Gait and station: Gait is normal. Tandem gait is normal. Romberg is negative. No drift is seen.  Reflexes: Deep tendon reflexes are symmetric and normal bilaterally.   DIAGNOSTIC DATA (LABS, IMAGING, TESTING) - I reviewed patient records, labs, notes, testing and imaging myself where available.  Lab Results  Component Value Date   WBC 5.7 12/08/2018   HGB 15.0 12/08/2018   HCT 43.4 12/08/2018   MCV 90 12/08/2018   PLT 84 (LL) 12/08/2018      Component Value Date/Time   NA 141 10/27/2019 1045   K 4.1 10/27/2019 1045   CL 104 10/27/2019 1045   CO2 22 10/27/2019 1045   GLUCOSE 110 (H) 10/27/2019 1045   GLUCOSE 127 (H) 12/16/2017 0841    BUN 7 10/27/2019 1045   CREATININE 0.84 10/27/2019 1045   CREATININE 0.74 12/16/2017 0841   CREATININE 0.69 12/26/2015 0929   CALCIUM 9.1 10/27/2019 1045   PROT 6.6 07/01/2019 0945   ALBUMIN 3.8 07/01/2019 0945   AST 51 (H) 07/01/2019 0945   ALT 26 07/01/2019 0945   ALKPHOS 153 (H) 07/01/2019 0945   BILITOT 1.5 (H) 07/01/2019 0945   GFRNONAA 81 10/27/2019 1045   GFRNONAA >60 12/16/2017 0841   GFRNONAA >89 12/26/2015 0929   GFRAA 93 10/27/2019 1045   GFRAA >60 12/16/2017 0841   GFRAA >89 12/26/2015 0929   Lab Results  Component Value Date   CHOL 101 07/01/2019   HDL 57 07/01/2019   LDLCALC 29 07/01/2019   TRIG 70 07/01/2019   CHOLHDL 1.8 07/01/2019   Lab Results  Component Value Date   HGBA1C 6.2 (H) 10/27/2019   No results found for: [id number] Lab Results  Component Value Date   TSH 0.929 10/27/2019      ASSESSMENT AND PLAN 49 y.o. year old female  has a past medical history of Anemia, Angio-edema, Anxiety, Asthma, Depression, Hypothyroidism, Pre-diabetes, Right foot drop, Sleep apnea, Thyroid disease, Urticaria, and Uterine cancer ([place]). here with:  1.  Right foot drop, sensory loss of right superficial peroneal and deep peroneal distribution -Right common peroneal neuropathy -MRI of lumbar spine, MRI of right thigh showed no significant abnormalities   I spent 15 minutes with the patient. 50% of this time was spent   Butler Denmark, [place], DNP 01/24/2020, 5:42 AM [facility] [address] [place], [place] [address] [phone]

## 2020-02-01 ENCOUNTER — Ambulatory Visit (INDEPENDENT_AMBULATORY_CARE_PROVIDER_SITE_OTHER): Payer: Medicaid Other | Admitting: *Deleted

## 2020-02-01 DIAGNOSIS — J309 Allergic rhinitis, unspecified: Secondary | ICD-10-CM

## 2020-02-07 ENCOUNTER — Ambulatory Visit: Payer: Self-pay | Admitting: *Deleted

## 2020-02-07 ENCOUNTER — Other Ambulatory Visit: Payer: Self-pay | Admitting: Neurology

## 2020-02-07 DIAGNOSIS — E1149 Type 2 diabetes mellitus with other diabetic neurological complication: Secondary | ICD-10-CM

## 2020-02-07 NOTE — Telephone Encounter (Signed)
Patient has a upcoming appointment in February. Patient is due for a refill on Lyrica. [place] controlled substance registry checked and is appropriate.

## 2020-02-07 NOTE — Telephone Encounter (Signed)
C/o elevated glucose this am at 0830 for  282. Patient reports baseline glucose is in the 240's. Denies frequent urination, blurred vision, confusion, vomiting, rapid breathing at this time. Patient reports she takes her medications later in the day because she does not eat breakfast. Reports she has noticed her glucose has been elevated for the last month. Reports she has not been [place] the [facility] to exercise in the [place] as she normally has done in the past. Encouraged any type of physical activity she can handle to help manage glucose. Patient rechecked her glucose for 456 and reports she forgot to take her metformin at lunch. Patient took her 1 st dose of metformin at 4:05 pm. Instructed patient to go to [facility] or ED for elevated glucose. Reviewed and educated patient on medication management and activity level to manage diabetes. Patient needs additional education on management of diabetes. Care advise given. Patient verbalized understanding of care advise and to go to [facility] or ED.   Reason for Disposition . Blood glucose > 400 mg/dL (99.2 mmol/L)  Answer Assessment - Initial Assessment Questions 1. BLOOD GLUCOSE: "What is your blood glucose level?"      282 2. ONSET: "When did you check the blood glucose?"     This am for 282 3. USUAL RANGE: "What is your glucose level usually?" (e.g., usual fasting morning value, usual evening value)     240's 4. KETONES: "Do you check for ketones (urine or blood test strips)?" If yes, ask: "What does the test show now?"      na 5. TYPE 1 or 2:  "Do you know what type of diabetes you have?"  (e.g., Type 1, Type 2, Gestational; doesn't know)      Type 2 6. INSULIN: "Do you take insulin?" "What type of insulin(s) do you use? What is the mode of delivery? (syringe, pen; injection or pump)?"      no 7. DIABETES PILLS: "Do you take any pills for your diabetes?" If yes, ask: "Have you missed taking any pills recently?"     Glipizide, metformin, victoza 8. OTHER  SYMPTOMS: "Do you have any symptoms?" (e.g., fever, frequent urination, difficulty breathing, dizziness, weakness, vomiting)     Na  9. PREGNANCY: "Is there any chance you are pregnant?" "When was your last menstrual period?"     na  Protocols used: DIABETES - HIGH BLOOD SUGAR-A-AH

## 2020-02-17 ENCOUNTER — Ambulatory Visit (INDEPENDENT_AMBULATORY_CARE_PROVIDER_SITE_OTHER): Payer: Medicaid Other

## 2020-02-17 DIAGNOSIS — J309 Allergic rhinitis, unspecified: Secondary | ICD-10-CM

## 2020-02-21 ENCOUNTER — Encounter: Payer: Self-pay | Admitting: Family Medicine

## 2020-02-21 ENCOUNTER — Other Ambulatory Visit: Payer: Self-pay | Admitting: Family Medicine

## 2020-02-21 DIAGNOSIS — E1149 Type 2 diabetes mellitus with other diabetic neurological complication: Secondary | ICD-10-CM

## 2020-02-21 MED ORDER — GLIPIZIDE 10 MG PO TABS: 20.0000 mg | ORAL_TABLET | Freq: Two times a day (BID) | ORAL | 1 refills | Status: DC

## 2020-03-08 ENCOUNTER — Other Ambulatory Visit: Payer: Self-pay | Admitting: Family Medicine

## 2020-03-08 DIAGNOSIS — E039 Hypothyroidism, unspecified: Secondary | ICD-10-CM

## 2020-03-08 MED ORDER — LEVOTHYROXINE SODIUM 137 MCG PO TABS: 137.0000 ug | ORAL_TABLET | Freq: Every day | ORAL | 3 refills | Status: DC

## 2020-03-08 NOTE — Telephone Encounter (Signed)
Medication: levothyroxine (SYNTHROID) 137 MCG tablet Has the pt contacted their pharmacy? Yes  Preferred pharmacy: [facility] [address], [place]  Please be advised refills may take up to 3 business days.  We ask that you follow up with your pharmacy.

## 2020-03-10 ENCOUNTER — Ambulatory Visit: Payer: Self-pay | Admitting: *Deleted

## 2020-03-10 NOTE — Telephone Encounter (Signed)
I returned pt's call.  She is positive for covid and has a sore throat.   She wanted some recommendations for medications to take to help ease the sore throat.  I went over the care advice with her.  I also suggested Sucrets Sore Throat lozenges, Chloraseptic Spray helps also.  She's going to have her brother pick up these for her.  I let her know to call us back if she does not get better.   Her covid test was done Monday.  She verbalized understanding and thanked me for returning her call.    Reason for Disposition . [1] Sore throat with cough/cold symptoms AND [2] present < 5 days    Positive for covid  Answer Assessment - Initial Assessment Questions 1. ONSET: "When did the throat start hurting?" (Hours or days ago)      Monday tested for covid and it's positive but have a sore throat.   I have fatigue, headache, no fever now.  Voice raspy.  No coughing  2. SEVERITY: "How bad is the sore throat?" (Scale 1-10; mild, moderate or severe)   - MILD (1-3):  doesn't interfere with eating or normal activities   - MODERATE (4-7): interferes with eating some solids and normal activities   - SEVERE (8-10):  excruciating pain, interferes with most normal activities   - SEVERE DYSPHAGIA: can't swallow liquids, drooling     Moderate 3. STREP EXPOSURE: "Has there been any exposure to strep within the past week?" If Yes, ask: "What type of contact occurred?"      No 4.  VIRAL SYMPTOMS: "Are there any symptoms of a cold, such as a runny nose, cough, hoarse voice or red eyes?"      I have covid and have fatigue and a headache.   Fever is gone now.   "I just feel bad all over" 5. FEVER: "Do you have a fever?" If Yes, ask: "What is your temperature, how was it measured, and when did it start?"     I did have but not now 6. PUS ON THE TONSILS: "Is there pus on the tonsils in the back of your throat?"     Not looked 7. OTHER SYMPTOMS: "Do you have any other symptoms?" (e.g., difficulty breathing,  headache, rash)     Denies runny nose or coughing. 8. PREGNANCY: "Is there any chance you are pregnant?" "When was your last menstrual period?"     Not asked  Protocols used: SORE THROAT-A-AH

## 2020-03-14 ENCOUNTER — Telehealth (INDEPENDENT_AMBULATORY_CARE_PROVIDER_SITE_OTHER): Payer: Medicaid Other | Admitting: Physician Assistant

## 2020-03-14 ENCOUNTER — Ambulatory Visit: Payer: Self-pay | Admitting: *Deleted

## 2020-03-14 DIAGNOSIS — Z8616 Personal history of COVID-19: Secondary | ICD-10-CM | POA: Diagnosis not present

## 2020-03-14 DIAGNOSIS — J029 Acute pharyngitis, unspecified: Secondary | ICD-10-CM

## 2020-03-14 MED ORDER — AMOXICILLIN 500 MG PO CAPS: 500.0000 mg | ORAL_CAPSULE | Freq: Three times a day (TID) | ORAL | 0 refills | Status: DC

## 2020-03-14 NOTE — Telephone Encounter (Signed)
I returned pt's call.   She had called in c/o coughing and a sore throat related to covid. "Someone called me a few minutes ago and set me up a virtual visit for today".    I asked if I could help her with anything and she said ,   "No since I have the visit set up".  No triage done/needed.      Answer Assessment - Initial Assessment Questions 1. COVID-19 ONSET: "When did the symptoms of COVID-19 first start?"     Someone just called me and set up a virtual visit.   Triage not continued. 2. DIAGNOSIS CONFIRMATION: "How were you diagnosed?" (e.g., COVID-19 oral or nasal viral test; COVID-19 antibody test; doctor visit)     *No Answer* 3. MAIN SYMPTOM:  "What is your main concern or symptom right now?" (e.g., breathing difficulty, cough, fatigue. loss of smell)     *No Answer* 4. SYMPTOM ONSET: "When did the  *No Answer*  start?"     *No Answer* 5. BETTER-SAME-WORSE: "Are you getting better, staying the same, or getting worse over the last 1 to 2 weeks?"     *No Answer* 6. RECENT MEDICAL VISIT: "Have you been seen by a healthcare provider (doctor, NP, PA) for these persisting COVID-19 symptoms?" If Yes, ask: "When were you seen?" (e.g., date)     *No Answer* 7. COUGH: "Do you have a cough?" If Yes, ask: "How bad is the cough?"       *No Answer* 8. FEVER: "Do you have a fever?" If Yes, ask: "What is your temperature, how was it measured, and when did it start?"     *No Answer* 9. BREATHING DIFFICULTY: "Are you having any trouble breathing?" If Yes, ask: "How bad is your breathing?" (e.g., mild, moderate, severe)    - MILD: No SOB at rest, mild SOB with walking, speaks normally in sentences, can lie down, no retractions, pulse < 100.    - MODERATE: SOB at rest, SOB with minimal exertion and prefers to sit, cannot lie down flat, speaks in phrases, mild retractions, audible wheezing, pulse 100-120.    - SEVERE: Very SOB at rest, speaks in single words, struggling to breathe, sitting hunched  forward, retractions, pulse > 120       *No Answer* 10. HIGH RISK DISEASE: "Do you have any chronic medical problems?" (e.g., asthma, heart or lung disease, weak immune system, obesity, etc.)       *No Answer* 11. VACCINE: "Have you gotten the COVID-19 vaccine?" If Yes ask: "Which one, how many shots, when did you get it?"       *No Answer* 12. PREGNANCY: "Is there any chance you are pregnant?" "When was your last menstrual period?"       *No Answer* 13. OTHER SYMPTOMS: "Do you have any other symptoms?"  (e.g., fatigue, headache, muscle pain, weakness)       *No Answer*  Protocols used: CORONAVIRUS (COVID-19) PERSISTING SYMPTOMS FOLLOW-UP CALL-A-AH

## 2020-03-14 NOTE — Progress Notes (Signed)
Established Patient Office Visit  Subjective:  Patient ID: Janet [name], female    DOB: Jul 16, 1970  Age: 50 y.o. MRN: [id number]  CC:  Chief Complaint  Patient presents with  . Covid Positive   Virtual Visit via Video Note  I connected with Janet [name] on 03/14/20 at  1:50 PM EST by a video enabled telemedicine application and verified that I am speaking with the correct person using two identifiers.  Location: Patient: Home Provider: [place] at [facility]   I discussed the limitations of evaluation and management by telemedicine and the availability of in person appointments. The patient expressed understanding and agreed to proceed.  History of Present Illness:  HPI Janet [name] reports that tested positive for Covid 03/06/20, states that she started having sxs two days prior.   States that she continues to have a sore throat," feels like nails," cough, headache, fever  subsided last Thursday, 03/07/20 Has been using delsym; theraflu and cough lonzges with a little relief   No history of covid vaccines     Observations/Objective: Medical history and current medications reviewed, no physical exam completed   Janet [name] Janet Birch Farino, PA-C     Past Medical History:  Diagnosis Date  . Anemia   . Angio-edema   . Anxiety   . Asthma   . Depression   . Hypothyroidism   . Pre-diabetes   . Right foot drop   . Sleep apnea   . Thyroid disease   . Urticaria   . Uterine cancer [facility])     Past Surgical History:  Procedure Laterality Date  . ABDOMINAL HYSTERECTOMY    . CARPAL TUNNEL RELEASE    . CESAREAN SECTION    . CESAREAN SECTION N/A    Phreesia 03/14/2020  . CHOLECYSTECTOMY    . cyst removal     uterus  . DILATION AND CURETTAGE OF UTERUS     still birth  . DILATION AND CURETTAGE OF UTERUS N/A 06/11/2016   Procedure: DILATATION AND CURETTAGE;  Surgeon: Emily Filbert, MD;  Location: [facility];  Service: Gynecology;  Laterality: N/A;  . ROBOTIC ASSISTED TOTAL  HYSTERECTOMY WITH BILATERAL SALPINGO OOPHERECTOMY Bilateral 07/09/2016   Procedure: XI ROBOTIC ASSISTED TOTAL HYSTERECTOMY WITH BILATERAL SALPINGO OOPHORECTOMY FOR UTERUS GREATER THAN 250 GRAMS, LYSIS OF ADHESIONS;  Surgeon: Everitt Amber, MD;  Location: [facility];  Service: Gynecology;  Laterality: Bilateral;  . SINOSCOPY    . SINUS EXPLORATION    . WISDOM TOOTH EXTRACTION      Family History  Problem Relation Age of Onset  . Heart attack Mother   . Allergic rhinitis Mother   . Suicidality Father     Social History   Socioeconomic History  . Marital status: Widowed    Spouse name: Not on file  . Number of children: 1  . Years of education: college  . Highest education level: Not on file  Occupational History  . Occupation: Unemployed  Tobacco Use  . Smoking status: Never Smoker  . Smokeless tobacco: Never Used  Vaping Use  . Vaping Use: Never used  Substance and Sexual Activity  . Alcohol use: No  . Drug use: No  . Sexual activity: Not Currently    Birth control/protection: None  Other Topics Concern  . Not on file  Social History Narrative   Lives at home with her son.   Right-handed.   2 cups caffeine per day.   Social Determinants of Health   Financial Resource Strain: Not on file  Food Insecurity: Not on file  Transportation Needs: Not on file  Physical Activity: Not on file  Stress: Not on file  Social Connections: Not on file  Intimate Partner Violence: Not on file    Outpatient Medications Prior to Visit  Medication Sig Dispense Refill  . albuterol (PROVENTIL) (2.5 MG/3ML) 0.083% nebulizer solution Take 3 mLs (2.5 mg total) by nebulization every 6 (six) hours as needed for wheezing or shortness of breath. 75 mL 3  . albuterol (VENTOLIN HFA) 108 (90 Base) MCG/ACT inhaler Inhale 1-2 puffs into the lungs every 6 (six) hours as needed for wheezing or shortness of breath. 54 g 3  . atorvastatin (LIPITOR) 20 MG tablet Take 1 tablet (20 mg total) by mouth daily. 90  tablet 1  . BD PEN NEEDLE NANO 2ND GEN 32G X 4 MM MISC USE 1  ONCE DAILY TO  INJECT  VICTOZA 100 each 0  . budesonide-formoterol (SYMBICORT) 160-4.5 MCG/ACT inhaler Inhale 2 puffs into the lungs 2 (two) times daily. 1 Inhaler 5  . cholecalciferol (VITAMIN D) 1000 units tablet Take 1,000 Units by mouth at bedtime.    . cyclobenzaprine (FLEXERIL) 5 MG tablet Take 1 tablet (5 mg total) by mouth 2 (two) times daily. 30 tablet 0  . diazepam (VALIUM) 5 MG tablet Take 1 tablet (5 mg total) by mouth every 6 (six) hours as needed for anxiety. (Patient not taking: Reported on 09/28/2019) 2 tablet 0  . DULoxetine (CYMBALTA) 60 MG capsule Take 1 capsule by mouth once daily 90 capsule 0  . dupilumab (DUPIXENT) 300 MG/2ML prefilled syringe Inject 300 mg into the skin every 14 (fourteen) days. 4 mL 11  . EPINEPHrine 0.3 mg/0.3 mL IJ SOAJ injection Use as directed for life threatening allergic reactions 2 each 3  . EQ ALLERGY RELIEF, CETIRIZINE, 10 MG tablet Take 1 tablet by mouth once daily 90 tablet 0  . fluticasone (FLONASE) 50 MCG/ACT nasal spray Place 2 sprays into both nostrils daily. 16 g 6  . furosemide (LASIX) 20 MG tablet Take 1 tablet (20 mg total) by mouth daily as needed. 30 tablet 0  . glipiZIDE (GLUCOTROL) 10 MG tablet Take 2 tablets (20 mg total) by mouth 2 (two) times daily before a meal. 360 tablet 1  . levothyroxine (SYNTHROID) 137 MCG tablet Take 1 tablet (137 mcg total) by mouth daily before breakfast. 30 tablet 3  . metFORMIN (GLUCOPHAGE) 500 MG tablet Take 2 tablets (1,000 mg total) by mouth 2 (two) times daily with a meal. 360 tablet 1  . montelukast (SINGULAIR) 10 MG tablet Take 1 tablet (10 mg total) by mouth at bedtime. 90 tablet 1  . Multiple Vitamin (MULTIVITAMIN) tablet Take 1 tablet by mouth daily.    Marland Kitchen omeprazole (PRILOSEC) 40 MG capsule Take 1 capsule by mouth once daily 30 capsule 3  . pregabalin (LYRICA) 75 MG capsule Take 1 capsule by mouth twice daily 60 capsule 1  .  triamcinolone cream (KENALOG) 0.1 % Apply 1 application topically 2 (two) times daily. 80 g 0  . liraglutide (VICTOZA) 18 MG/3ML SOPN Inject 0.3 mLs (1.8 mg total) into the skin daily. 30 mL 6  . losartan (COZAAR) 50 MG tablet TAKE 1 TABLET BY MOUTH ONCE DAILY. 90 tablet 1  . OXcarbazepine (TRILEPTAL) 150 MG tablet Take 1 tablet by mouth twice daily 180 tablet 1   No facility-administered medications prior to visit.    Allergies  Allergen Reactions  . Ciprofloxacin Shortness Of Breath  . Shrimp [  Shellfish Allergy] Other (See Comments)    positive allergy test    ROS Review of Systems  Constitutional: Positive for fatigue. Negative for chills and fever.  HENT: Positive for sore throat and trouble swallowing. Negative for congestion.   Eyes: Negative.   Respiratory: Positive for cough. Negative for shortness of breath and wheezing.   Cardiovascular: Negative for chest pain.  Gastrointestinal: Negative for abdominal pain, nausea and vomiting.  Endocrine: Negative.   Genitourinary: Negative.   Musculoskeletal: Negative for myalgias.  Skin: Negative.   Allergic/Immunologic: Negative.   Neurological: Positive for headaches.  Hematological: Negative.   Psychiatric/Behavioral: Negative.       Objective:     LMP  (LMP Unknown) Comment: irregular periods previously, bleeding started in january Wt Readings from Last 3 Encounters:  09/03/19 (!) 408 lb 9.6 oz (185.3 kg)  07/01/19 (!) 403 lb (182.8 kg)  04/28/19 (!) 404 lb 3.2 oz (183.3 kg)     Health Maintenance Due  Topic Date Due  . Hepatitis C Screening  Never done  . COVID-19 Vaccine (1) Never done  . OPHTHALMOLOGY EXAM  07/16/2018  . FOOT EXAM  01/22/2019  . INFLUENZA VACCINE  Never done    There are no preventive care reminders to display for this patient.  Lab Results  Component Value Date   TSH 0.929 10/27/2019   Lab Results  Component Value Date   WBC 5.7 12/08/2018   HGB 15.0 12/08/2018   HCT 43.4  12/08/2018   MCV 90 12/08/2018   PLT 84 (LL) 12/08/2018   Lab Results  Component Value Date   NA 141 10/27/2019   K 4.1 10/27/2019   CO2 22 10/27/2019   GLUCOSE 110 (H) 10/27/2019   BUN 7 10/27/2019   CREATININE 0.84 10/27/2019   BILITOT 1.5 (H) 07/01/2019   ALKPHOS 153 (H) 07/01/2019   AST 51 (H) 07/01/2019   ALT 26 07/01/2019   PROT 6.6 07/01/2019   ALBUMIN 3.8 07/01/2019   CALCIUM 9.1 10/27/2019   ANIONGAP 7 12/16/2017   Lab Results  Component Value Date   CHOL 101 07/01/2019   Lab Results  Component Value Date   HDL 57 07/01/2019   Lab Results  Component Value Date   LDLCALC 29 07/01/2019   Lab Results  Component Value Date   TRIG 70 07/01/2019   Lab Results  Component Value Date   CHOLHDL 1.8 07/01/2019   Lab Results  Component Value Date   HGBA1C 6.2 (H) 10/27/2019      Assessment & Plan:   Problem List Items Addressed This Visit      Other   History of COVID-19    Other Visit Diagnoses    Acute pharyngitis, unspecified etiology    -  Primary   Relevant Medications   amoxicillin (AMOXIL) 500 MG capsule      Meds ordered this encounter  Medications  . amoxicillin (AMOXIL) 500 MG capsule    Sig: Take 1 capsule (500 mg total) by mouth 3 (three) times daily.    Dispense:  30 capsule    Refill:  0    Order Specific Question:   Supervising Provider    Answer:   Elsie Stain [[id number]]    Assessment and Plan: 1. Acute pharyngitis, unspecified etiology Patient education given, continue symptomatic treatment.  Red flags given for prompt reevaluation - amoxicillin (AMOXIL) 500 MG capsule; Take 1 capsule (500 mg total) by mouth 3 (three) times daily.  Dispense: 30 capsule; Refill: 0  2. History of COVID-19    Follow Up Instructions:    I discussed the assessment and treatment plan with the patient. The patient was provided an opportunity to ask questions and all were answered. The patient agreed with the plan and demonstrated an  understanding of the instructions.   The patient was advised to call back or seek an in-person evaluation if the symptoms worsen or if the condition fails to improve as anticipated.  I provided 21 minutes of face-to-face and non-face-to-face time during this encounter.   Follow-up: Return if symptoms worsen or fail to improve.    Loraine Grip Beonka Amesquita, PA-C

## 2020-03-15 ENCOUNTER — Other Ambulatory Visit: Payer: Self-pay | Admitting: Family Medicine

## 2020-03-15 ENCOUNTER — Other Ambulatory Visit: Payer: Self-pay | Admitting: Neurology

## 2020-03-15 DIAGNOSIS — E1149 Type 2 diabetes mellitus with other diabetic neurological complication: Secondary | ICD-10-CM

## 2020-03-15 DIAGNOSIS — I10 Essential (primary) hypertension: Secondary | ICD-10-CM

## 2020-03-18 ENCOUNTER — Encounter: Payer: Self-pay | Admitting: Physician Assistant

## 2020-03-18 DIAGNOSIS — Z8616 Personal history of COVID-19: Secondary | ICD-10-CM | POA: Insufficient documentation

## 2020-03-18 NOTE — Patient Instructions (Signed)

## 2020-03-21 ENCOUNTER — Ambulatory Visit: Payer: Medicaid Other | Admitting: Neurology

## 2020-03-21 NOTE — Progress Notes (Deleted)
PATIENT: [name] DOB: 1970/03/16  REASON FOR VISIT: follow up HISTORY FROM: patient  HISTORY OF PRESENT ILLNESS: Today 03/21/20  HISTORY Christy Ehrsam, is a 50 year old female, seen in refer by her primary care doctor Arnoldo Morale, for evaluation of right foot drop, initial evaluation was on November twelfth 2018.  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  She noticed that the top of right toe went numb since April 2018, she described right toe numbness tingling, radiating paresthesia to the top of right foot, right lateral leg, also complains of low back pain, radiating pain to her right leg, getting worse after standing up since July 2018, she denies bowel and bladder incontinence, no left leg involvement,  I personally reviewed x-ray in October 2018, degenerative changes of lumbar spine with scoliosis concave to the left, no acute abnormality,  Laboratory evaluation in October 2018, glucose was 105, creatinine 0.7, lipid profile, cholesterol 131, LDL 50, normal CMP with creatinine of 0.69, decreased TSH 0.17, A1c 6.2, uric acid 4.7, CPK 78  Update April 17, 2017: MRI of lumbar in December 2018 showed mild degenerative changes, no significant stenosis, or  nerve root compression  MRI of right thigh showed no significant abnormality, no evidence of nerve damage.  PET scan October 2018 no evidence of hypermetabolic recurrent, metastatic disease, retroperitoneal adenopathy persistent, remains suspicious based on interval progression since May 2018  CT abdomen in February 2019, similar appearing aortocaval lymph nodes when compared to recent scan, metastatic disease to this location is not excluded, recommended follow-up scan, Hepatic cirrhosis, portal vein hypertension with splenomegaly,upper abdominal collateral vessels.  EMG nerve conduction study noticed significant active denervation at right tibialis anterior, peroneal longus, mild involvement of right biceps femoris short head. With well preserved bilateral sural, and superficial peroneal sensory potentials, which raised the possibility of right sciatic neuropathy versus right lumbosacral radiculopathy.  She reported 25% improved, right foot is getting stronger, she still has low back pain, peritoneal pain.  UPDATE Sept 11 2019: She is still have significant right distal leg weakness, wearright ankle brace for long distance, taking Trileptal 150 mg twice a day, Cymbalta 60 mg twice a day, run out of Lyrica 75 twice a day, realize it has helped her right lower extremity deep achy pain,  she will havefollow-up appointment with her oncologist Dr. Denman George in November, and planning will have a repeat CT abdomen in November 2019,   She is planning on to have bariatric surgery for weight loss in October 2019  Update January 06, 2019 SS: She continues to complain of numbness to the right foot, but the medications help to manage symptoms.  She remains on Trileptal 150 mg twice a day, Cymbalta 60 mg twice a day, Lyrica 75 mg twice a day.  She notices if she misses a dose of the medicine, she has significant numbness, achy pain to her right lower  extremity.  She has not been consistent to wear  her AFO brace for her right foot drop.  She suffered a fall on Monday, while bringing the trash can in.  She is sore from the fall.  The fall served as a reminder to her to wear her brace.  She continues to follow with oncology for history of uterine cancer, thus far has done well.  She has chronic low platelets.  She is receiving injections in her back for chronic back pain.  She was approved for disability this year.  She lives at home with her 43 year old child, and drives a car.  She is seeing an allergy doctor for chronic respiratory issues, is now on prednisone.  She was planning to have bariatric surgery, but this got delayed due to the COVID-19 pandemic.  She presents today for follow-up unaccompanied.  Update March 21, 2020 SS:   REVIEW OF SYSTEMS: Out of a complete 14 system review of symptoms, the patient complains only of the following symptoms, and all other reviewed systems are negative.  ALLERGIES: Allergies  Allergen Reactions  . Ciprofloxacin Shortness Of Breath  . Shrimp [Shellfish Allergy] Other (See Comments)    positive allergy test    HOME MEDICATIONS: Outpatient Medications Prior to Visit  Medication Sig Dispense Refill  . albuterol (PROVENTIL) (2.5 MG/3ML) 0.083% nebulizer solution Take 3 mLs (2.5 mg total) by nebulization every 6 (six) hours as needed for wheezing or shortness of breath. 75 mL 3  . albuterol (VENTOLIN HFA) 108 (90 Base) MCG/ACT inhaler Inhale 1-2 puffs into the lungs every 6 (six) hours as needed for wheezing or shortness of breath. 54 g 3  . amoxicillin (AMOXIL) 500 MG capsule Take 1 capsule (500 mg total) by mouth 3 (three) times daily. 30 capsule 0  . atorvastatin (LIPITOR) 20 MG tablet Take 1 tablet (20 mg total) by mouth daily. 90 tablet 1  . BD PEN NEEDLE NANO 2ND GEN 32G X 4 MM MISC USE 1  ONCE DAILY TO  INJECT  VICTOZA 100 each 0  . budesonide-formoterol (SYMBICORT) 160-4.5 MCG/ACT inhaler Inhale 2 puffs into the lungs 2 (two) times daily. 1  Inhaler 5  . cholecalciferol (VITAMIN D) 1000 units tablet Take 1,000 Units by mouth at bedtime.    . cyclobenzaprine (FLEXERIL) 5 MG tablet Take 1 tablet (5 mg total) by mouth 2 (two) times daily. 30 tablet 0  . diazepam (VALIUM) 5 MG tablet Take 1 tablet (5 mg total) by mouth every 6 (six) hours as needed for anxiety. (Patient not taking: Reported on 09/28/2019) 2 tablet 0  . DULoxetine (CYMBALTA) 60 MG capsule Take 1 capsule by mouth once daily 90 capsule 0  . dupilumab (DUPIXENT) 300 MG/2ML prefilled syringe Inject 300 mg into the skin every 14 (fourteen) days. 4 mL 11  . EPINEPHrine 0.3 mg/0.3 mL IJ SOAJ injection Use as directed for life threatening allergic reactions 2 each 3  . EQ ALLERGY RELIEF, CETIRIZINE, 10 MG tablet Take 1 tablet by mouth once daily 90 tablet 0  . fluticasone (FLONASE) 50 MCG/ACT nasal spray Place 2 sprays into both nostrils daily. 16 g 6  . furosemide (LASIX) 20 MG tablet Take 1 tablet (20 mg total) by mouth daily as needed. 30 tablet 0  . glipiZIDE (GLUCOTROL) 10 MG tablet Take 2 tablets (20 mg total) by mouth 2 (two) times daily before a meal. 360 tablet 1  . levothyroxine (SYNTHROID) 137 MCG tablet Take 1 tablet (137 mcg total) by mouth  daily before breakfast. 30 tablet 3  . losartan (COZAAR) 50 MG tablet Take 1 tablet by mouth once daily 90 tablet 0  . metFORMIN (GLUCOPHAGE) 500 MG tablet Take 2 tablets (1,000 mg total) by mouth 2 (two) times daily with a meal. 360 tablet 1  . montelukast (SINGULAIR) 10 MG tablet Take 1 tablet (10 mg total) by mouth at bedtime. 90 tablet 1  . Multiple Vitamin (MULTIVITAMIN) tablet Take 1 tablet by mouth daily.    Marland Kitchen omeprazole (PRILOSEC) 40 MG capsule Take 1 capsule by mouth once daily 30 capsule 3  . OXcarbazepine (TRILEPTAL) 150 MG tablet Take 1 tablet by mouth twice daily 60 tablet 0  . pregabalin (LYRICA) 75 MG capsule Take 1 capsule by mouth twice daily 60 capsule 1  . triamcinolone cream (KENALOG) 0.1 % Apply 1 application  topically 2 (two) times daily. 80 g 0  . VICTOZA 18 MG/3ML SOPN INJECT 1.8 MG TOTAL INTO THE SKIN DAILY. 9 mL 0   No facility-administered medications prior to visit.    PAST MEDICAL HISTORY: Past Medical History:  Diagnosis Date  . Anemia   . Angio-edema   . Anxiety   . Asthma   . Depression   . Hypothyroidism   . Pre-diabetes   . Right foot drop   . Sleep apnea   . Thyroid disease   . Urticaria   . Uterine cancer ([place])     PAST SURGICAL HISTORY: Past Surgical History:  Procedure Laterality Date  . ABDOMINAL HYSTERECTOMY    . CARPAL TUNNEL RELEASE    . CESAREAN SECTION    . CESAREAN SECTION N/A    Phreesia 03/14/2020  . CHOLECYSTECTOMY    . cyst removal     uterus  . DILATION AND CURETTAGE OF UTERUS     still birth  . DILATION AND CURETTAGE OF UTERUS N/A 06/11/2016   Procedure: DILATATION AND CURETTAGE;  Surgeon: Emily Filbert, MD;  Location: [facility];  Service: Gynecology;  Laterality: N/A;  . ROBOTIC ASSISTED TOTAL HYSTERECTOMY WITH BILATERAL SALPINGO OOPHERECTOMY Bilateral 07/09/2016   Procedure: XI ROBOTIC ASSISTED TOTAL HYSTERECTOMY WITH BILATERAL SALPINGO OOPHORECTOMY FOR UTERUS GREATER THAN 250 GRAMS, LYSIS OF ADHESIONS;  Surgeon: Everitt Amber, MD;  Location: [facility];  Service: Gynecology;  Laterality: Bilateral;  . SINOSCOPY    . SINUS EXPLORATION    . WISDOM TOOTH EXTRACTION      FAMILY HISTORY: Family History  Problem Relation Age of Onset  . Heart attack Mother   . Allergic rhinitis Mother   . Suicidality Father     SOCIAL HISTORY: Social History   Socioeconomic History  . Marital status: Widowed    Spouse name: Not on file  . Number of children: 1  . Years of education: college  . Highest education level: Not on file  Occupational History  . Occupation: Unemployed  Tobacco Use  . Smoking status: Never Smoker  . Smokeless tobacco: Never Used  Vaping Use  . Vaping Use: Never used  Substance and Sexual Activity  . Alcohol use: No  . Drug use: No   . Sexual activity: Not Currently    Birth control/protection: None  Other Topics Concern  . Not on file  Social History Narrative   Lives at home with her son.   Right-handed.   2 cups caffeine per day.   Social Determinants of Health   Financial Resource Strain: Not on file  Food Insecurity: Not on file  Transportation Needs: Not on file  Physical Activity: Not on file  Stress: Not on file  Social Connections: Not on file  Intimate Partner Violence: Not on file      PHYSICAL EXAM  There were no vitals filed for this visit. There is no height or weight on file to calculate BMI.  Generalized: Well developed, in no acute distress   Neurological examination  Mentation: Alert oriented to time, place, history taking. Follows all commands speech and language fluent Cranial nerve II-XII: Pupils were equal round reactive to light. Extraocular movements were full, visual field were full on confrontational test. Facial sensation and strength were normal. Uvula tongue midline. Head turning and shoulder shrug  were normal and symmetric. Motor: The motor testing reveals 5 over 5 strength of all 4 extremities. Good symmetric motor tone is noted throughout.  Sensory: Sensory testing is intact to soft touch on all 4 extremities. No evidence of extinction is noted.  Coordination: Cerebellar testing reveals good finger-nose-finger and heel-to-shin bilaterally.  Gait and station: Gait is normal. Tandem gait is normal. Romberg is negative. No drift is seen.  Reflexes: Deep tendon reflexes are symmetric and normal bilaterally.   DIAGNOSTIC DATA (LABS, IMAGING, TESTING) - I reviewed patient records, labs, notes, testing and imaging myself where available.  Lab Results  Component Value Date   WBC 5.7 12/08/2018   HGB 15.0 12/08/2018   HCT 43.4 12/08/2018   MCV 90 12/08/2018   PLT 84 (LL) 12/08/2018      Component Value Date/Time   NA 141 10/27/2019 1045   K 4.1 10/27/2019 1045   CL  104 10/27/2019 1045   CO2 22 10/27/2019 1045   GLUCOSE 110 (H) 10/27/2019 1045   GLUCOSE 127 (H) 12/16/2017 0841   BUN 7 10/27/2019 1045   CREATININE 0.84 10/27/2019 1045   CREATININE 0.74 12/16/2017 0841   CREATININE 0.69 12/26/2015 0929   CALCIUM 9.1 10/27/2019 1045   PROT 6.6 07/01/2019 0945   ALBUMIN 3.8 07/01/2019 0945   AST 51 (H) 07/01/2019 0945   ALT 26 07/01/2019 0945   ALKPHOS 153 (H) 07/01/2019 0945   BILITOT 1.5 (H) 07/01/2019 0945   GFRNONAA 81 10/27/2019 1045   GFRNONAA >60 12/16/2017 0841   GFRNONAA >89 12/26/2015 0929   GFRAA 93 10/27/2019 1045   GFRAA >60 12/16/2017 0841   GFRAA >89 12/26/2015 0929   Lab Results  Component Value Date   CHOL 101 07/01/2019   HDL 57 07/01/2019   LDLCALC 29 07/01/2019   TRIG 70 07/01/2019   CHOLHDL 1.8 07/01/2019   Lab Results  Component Value Date   HGBA1C 6.2 (H) 10/27/2019   No results found for: [id number] Lab Results  Component Value Date   TSH 0.929 10/27/2019      ASSESSMENT AND PLAN 50 y.o. year old female  has a past medical history of Anemia, Angio-edema, Anxiety, Asthma, Depression, Hypothyroidism, Pre-diabetes, Right foot drop, Sleep apnea, Thyroid disease, Urticaria, and Uterine cancer ([place]). here with:  1.  Right foot drop, sensory loss of right superficial peroneal and deep peroneal distribution -Right common peroneal neuropathy -MRI of the lumbar spine showed no significant abnormality -MRI of the right thigh showed no significant abnormality -Continue Trileptal 150 mg twice a day -Continue Cymbalta 60 mg daily -Continue Lyrica 75 mg twice a day  I spent 15 minutes with the patient. 50% of this time was spent   Butler Denmark, Mina, DNP 03/21/2020, 5:42 AM [facility] [address], [place] [address], [place] [address] [phone]

## 2020-03-23 ENCOUNTER — Other Ambulatory Visit: Payer: Self-pay | Admitting: Family Medicine

## 2020-03-23 ENCOUNTER — Encounter: Payer: Self-pay | Admitting: Neurology

## 2020-03-23 ENCOUNTER — Ambulatory Visit: Payer: Medicaid Other | Admitting: Neurology

## 2020-03-23 DIAGNOSIS — I10 Essential (primary) hypertension: Secondary | ICD-10-CM

## 2020-03-23 NOTE — Progress Notes (Deleted)
PATIENT: Janet [name] DOB: 11/06/1970  REASON FOR VISIT: follow up HISTORY FROM: patient  HISTORY OF PRESENT ILLNESS: Today 03/23/20  HISTORY Janet [name], is a 50 year old female, seen in refer by her primary care doctor Arnoldo Morale, for evaluation of right foot drop, initial evaluation was on November twelfth 2018.  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  She noticed that the top of right toe went numb since April 2018, she described right toe numbness tingling, radiating paresthesia to the top of right foot, right lateral leg, also complains of low back pain, radiating pain to her right leg, getting worse after standing up since July 2018, she denies bowel and bladder incontinence, no left leg involvement,  I personally reviewed x-ray in October 2018, degenerative changes of lumbar spine with scoliosis concave to the left, no acute abnormality,  Laboratory evaluation in October 2018, glucose was 105, creatinine 0.7, lipid profile, cholesterol 131, LDL 50, normal CMP with creatinine of 0.69, decreased TSH 0.17, A1c 6.2, uric acid 4.7, CPK 78  Update April 17, 2017: MRI of lumbar in December 2018 showed mild degenerative changes, no significant stenosis, or  nerve root compression  MRI of right thigh showed no significant abnormality, no evidence of nerve damage.  PET scan October 2018 no evidence of hypermetabolic recurrent, metastatic disease, retroperitoneal adenopathy persistent, remains suspicious based on interval progression since May 2018  CT abdomen in February 2019, similar appearing aortocaval lymph nodes when compared to recent scan, metastatic disease to this location is not excluded, recommended follow-up scan, Hepatic cirrhosis, portal vein hypertension with splenomegaly,upper abdominal collateral vessels.  EMG nerve conduction study noticed significant active denervation at right tibialis anterior, peroneal longus, mild involvement of right biceps femoris short head. With well preserved bilateral sural, and superficial peroneal sensory potentials, which raised the possibility of right sciatic neuropathy versus right lumbosacral radiculopathy.  She reported 25% improved, right foot is getting stronger, she still has low back pain, peritoneal pain.  UPDATE Sept 11 2019: She is still have significant right distal leg weakness, wearright ankle brace for long distance, taking Trileptal 150 mg twice a day, Cymbalta 60 mg twice a day, run out of Lyrica 75 twice a day, realize it has helped her right lower extremity deep achy pain,  she will havefollow-up appointment with her oncologist Dr. Denman George in November, and planning will have a repeat CT abdomen in November 2019,   She is planning on to have bariatric surgery for weight loss in October 2019  Update January 06, 2019 SS: She continues to complain of numbness to the right foot, but the medications help to manage symptoms.  She remains on Trileptal 150 mg twice a day, Cymbalta 60 mg twice a day, Lyrica 75 mg twice a day.  She notices if she misses a dose of the medicine, she has significant numbness, achy pain to her right lower  extremity.  She has not been consistent to wear  her AFO brace for her right foot drop.  She suffered a fall on Monday, while bringing the trash can in.  She is sore from the fall.  The fall served as a reminder to her to wear her brace.  She continues to follow with oncology for history of uterine cancer, thus far has done well.  She has chronic low platelets.  She is receiving injections in her back for chronic back pain.  She was approved for disability this year.  She lives at home with her 26 year old child, and drives a car.  She is seeing an allergy doctor for chronic respiratory issues, is now on prednisone.  She was planning to have bariatric surgery, but this got delayed due to the COVID-19 pandemic.  She presents today for follow-up unaccompanied.  Update March 23, 2020 SS:   REVIEW OF SYSTEMS: Out of a complete 14 system review of symptoms, the patient complains only of the following symptoms, and all other reviewed systems are negative.  ALLERGIES: Allergies  Allergen Reactions  . Ciprofloxacin Shortness Of Breath  . Shrimp [Shellfish Allergy] Other (See Comments)    positive allergy test    HOME MEDICATIONS: Outpatient Medications Prior to Visit  Medication Sig Dispense Refill  . albuterol (PROVENTIL) (2.5 MG/3ML) 0.083% nebulizer solution Take 3 mLs (2.5 mg total) by nebulization every 6 (six) hours as needed for wheezing or shortness of breath. 75 mL 3  . albuterol (VENTOLIN HFA) 108 (90 Base) MCG/ACT inhaler Inhale 1-2 puffs into the lungs every 6 (six) hours as needed for wheezing or shortness of breath. 54 g 3  . amoxicillin (AMOXIL) 500 MG capsule Take 1 capsule (500 mg total) by mouth 3 (three) times daily. 30 capsule 0  . atorvastatin (LIPITOR) 20 MG tablet Take 1 tablet (20 mg total) by mouth daily. 90 tablet 1  . BD PEN NEEDLE NANO 2ND GEN 32G X 4 MM MISC USE 1  ONCE DAILY TO  INJECT  VICTOZA 100 each 0  . budesonide-formoterol (SYMBICORT) 160-4.5 MCG/ACT inhaler Inhale 2 puffs into the lungs 2 (two) times daily. 1  Inhaler 5  . cholecalciferol (VITAMIN D) 1000 units tablet Take 1,000 Units by mouth at bedtime.    . cyclobenzaprine (FLEXERIL) 5 MG tablet Take 1 tablet (5 mg total) by mouth 2 (two) times daily. 30 tablet 0  . diazepam (VALIUM) 5 MG tablet Take 1 tablet (5 mg total) by mouth every 6 (six) hours as needed for anxiety. (Patient not taking: Reported on 09/28/2019) 2 tablet 0  . DULoxetine (CYMBALTA) 60 MG capsule Take 1 capsule by mouth once daily 90 capsule 0  . dupilumab (DUPIXENT) 300 MG/2ML prefilled syringe Inject 300 mg into the skin every 14 (fourteen) days. 4 mL 11  . EPINEPHrine 0.3 mg/0.3 mL IJ SOAJ injection Use as directed for life threatening allergic reactions 2 each 3  . EQ ALLERGY RELIEF, CETIRIZINE, 10 MG tablet Take 1 tablet by mouth once daily 90 tablet 0  . fluticasone (FLONASE) 50 MCG/ACT nasal spray Place 2 sprays into both nostrils daily. 16 g 6  . furosemide (LASIX) 20 MG tablet Take 1 tablet (20 mg total) by mouth daily as needed. 30 tablet 0  . glipiZIDE (GLUCOTROL) 10 MG tablet Take 2 tablets (20 mg total) by mouth 2 (two) times daily before a meal. 360 tablet 1  . levothyroxine (SYNTHROID) 137 MCG tablet Take 1 tablet (137 mcg total) by mouth  daily before breakfast. 30 tablet 3  . losartan (COZAAR) 50 MG tablet Take 1 tablet by mouth once daily 90 tablet 0  . metFORMIN (GLUCOPHAGE) 500 MG tablet Take 2 tablets (1,000 mg total) by mouth 2 (two) times daily with a meal. 360 tablet 1  . montelukast (SINGULAIR) 10 MG tablet Take 1 tablet (10 mg total) by mouth at bedtime. 90 tablet 1  . Multiple Vitamin (MULTIVITAMIN) tablet Take 1 tablet by mouth daily.    Marland Kitchen omeprazole (PRILOSEC) 40 MG capsule Take 1 capsule by mouth once daily 30 capsule 3  . OXcarbazepine (TRILEPTAL) 150 MG tablet Take 1 tablet by mouth twice daily 60 tablet 0  . pregabalin (LYRICA) 75 MG capsule Take 1 capsule by mouth twice daily 60 capsule 1  . triamcinolone cream (KENALOG) 0.1 % Apply 1 application  topically 2 (two) times daily. 80 g 0  . VICTOZA 18 MG/3ML SOPN INJECT 1.8 MG TOTAL INTO THE SKIN DAILY. 9 mL 0   No facility-administered medications prior to visit.    PAST MEDICAL HISTORY: Past Medical History:  Diagnosis Date  . Anemia   . Angio-edema   . Anxiety   . Asthma   . Depression   . Hypothyroidism   . Pre-diabetes   . Right foot drop   . Sleep apnea   . Thyroid disease   . Urticaria   . Uterine cancer ([place])     PAST SURGICAL HISTORY: Past Surgical History:  Procedure Laterality Date  . ABDOMINAL HYSTERECTOMY    . CARPAL TUNNEL RELEASE    . CESAREAN SECTION    . CESAREAN SECTION N/A    Phreesia 03/14/2020  . CHOLECYSTECTOMY    . cyst removal     uterus  . DILATION AND CURETTAGE OF UTERUS     still birth  . DILATION AND CURETTAGE OF UTERUS N/A 06/11/2016   Procedure: DILATATION AND CURETTAGE;  Surgeon: Emily Filbert, MD;  Location: [facility];  Service: Gynecology;  Laterality: N/A;  . ROBOTIC ASSISTED TOTAL HYSTERECTOMY WITH BILATERAL SALPINGO OOPHERECTOMY Bilateral 07/09/2016   Procedure: XI ROBOTIC ASSISTED TOTAL HYSTERECTOMY WITH BILATERAL SALPINGO OOPHORECTOMY FOR UTERUS GREATER THAN 250 GRAMS, LYSIS OF ADHESIONS;  Surgeon: Everitt Amber, MD;  Location: [facility];  Service: Gynecology;  Laterality: Bilateral;  . SINOSCOPY    . SINUS EXPLORATION    . WISDOM TOOTH EXTRACTION      FAMILY HISTORY: Family History  Problem Relation Age of Onset  . Heart attack Mother   . Allergic rhinitis Mother   . Suicidality Father     SOCIAL HISTORY: Social History   Socioeconomic History  . Marital status: Widowed    Spouse name: Not on file  . Number of children: 1  . Years of education: college  . Highest education level: Not on file  Occupational History  . Occupation: Unemployed  Tobacco Use  . Smoking status: Never Smoker  . Smokeless tobacco: Never Used  Vaping Use  . Vaping Use: Never used  Substance and Sexual Activity  . Alcohol use: No  . Drug use: No   . Sexual activity: Not Currently    Birth control/protection: None  Other Topics Concern  . Not on file  Social History Narrative   Lives at home with her son.   Right-handed.   2 cups caffeine per day.   Social Determinants of Health   Financial Resource Strain: Not on file  Food Insecurity: Not on file  Transportation Needs: Not on file  Physical Activity: Not on file  Stress: Not on file  Social Connections: Not on file  Intimate Partner Violence: Not on file      PHYSICAL EXAM  There were no vitals filed for this visit. There is no height or weight on file to calculate BMI.  Generalized: Well developed, in no acute distress   Neurological examination  Mentation: Alert oriented to time, place, history taking. Follows all commands speech and language fluent Cranial nerve II-XII: Pupils were equal round reactive to light. Extraocular movements were full, visual field were full on confrontational test. Facial sensation and strength were normal. Uvula tongue midline. Head turning and shoulder shrug  were normal and symmetric. Motor: The motor testing reveals 5 over 5 strength of all 4 extremities. Good symmetric motor tone is noted throughout.  Sensory: Sensory testing is intact to soft touch on all 4 extremities. No evidence of extinction is noted.  Coordination: Cerebellar testing reveals good finger-nose-finger and heel-to-shin bilaterally.  Gait and station: Gait is normal. Tandem gait is normal. Romberg is negative. No drift is seen.  Reflexes: Deep tendon reflexes are symmetric and normal bilaterally.   DIAGNOSTIC DATA (LABS, IMAGING, TESTING) - I reviewed patient records, labs, notes, testing and imaging myself where available.  Lab Results  Component Value Date   WBC 5.7 12/08/2018   HGB 15.0 12/08/2018   HCT 43.4 12/08/2018   MCV 90 12/08/2018   PLT 84 (LL) 12/08/2018      Component Value Date/Time   NA 141 10/27/2019 1045   K 4.1 10/27/2019 1045   CL  104 10/27/2019 1045   CO2 22 10/27/2019 1045   GLUCOSE 110 (H) 10/27/2019 1045   GLUCOSE 127 (H) 12/16/2017 0841   BUN 7 10/27/2019 1045   CREATININE 0.84 10/27/2019 1045   CREATININE 0.74 12/16/2017 0841   CREATININE 0.69 12/26/2015 0929   CALCIUM 9.1 10/27/2019 1045   PROT 6.6 07/01/2019 0945   ALBUMIN 3.8 07/01/2019 0945   AST 51 (H) 07/01/2019 0945   ALT 26 07/01/2019 0945   ALKPHOS 153 (H) 07/01/2019 0945   BILITOT 1.5 (H) 07/01/2019 0945   GFRNONAA 81 10/27/2019 1045   GFRNONAA >60 12/16/2017 0841   GFRNONAA >89 12/26/2015 0929   GFRAA 93 10/27/2019 1045   GFRAA >60 12/16/2017 0841   GFRAA >89 12/26/2015 0929   Lab Results  Component Value Date   CHOL 101 07/01/2019   HDL 57 07/01/2019   LDLCALC 29 07/01/2019   TRIG 70 07/01/2019   CHOLHDL 1.8 07/01/2019   Lab Results  Component Value Date   HGBA1C 6.2 (H) 10/27/2019   No results found for: [id number] Lab Results  Component Value Date   TSH 0.929 10/27/2019      ASSESSMENT AND PLAN 50 y.o. year old female  has a past medical history of Anemia, Angio-edema, Anxiety, Asthma, Depression, Hypothyroidism, Pre-diabetes, Right foot drop, Sleep apnea, Thyroid disease, Urticaria, and Uterine cancer ([place]). here with:  1.  Right foot drop, sensory loss of right superficial peroneal and deep peroneal distribution -Right common peroneal neuropathy -MRI of the lumbar spine showed no significant abnormality -MRI of the right thigh showed no significant abnormality -Continue Trileptal 150 mg twice a day -Continue Cymbalta 60 mg daily -Continue Lyrica 75 mg twice a day  I spent 15 minutes with the patient. 50% of this time was spent   Butler Denmark, [place], [place] 03/23/2020, 5:39 AM [facility] [address] [place], [place] [address] [phone]

## 2020-03-29 ENCOUNTER — Other Ambulatory Visit: Payer: Self-pay | Admitting: Family Medicine

## 2020-03-29 DIAGNOSIS — E1149 Type 2 diabetes mellitus with other diabetic neurological complication: Secondary | ICD-10-CM

## 2020-04-03 ENCOUNTER — Other Ambulatory Visit: Payer: Self-pay | Admitting: Family Medicine

## 2020-04-06 ENCOUNTER — Ambulatory Visit (INDEPENDENT_AMBULATORY_CARE_PROVIDER_SITE_OTHER): Payer: Medicaid Other | Admitting: *Deleted

## 2020-04-06 DIAGNOSIS — J309 Allergic rhinitis, unspecified: Secondary | ICD-10-CM

## 2020-04-11 DIAGNOSIS — J3081 Allergic rhinitis due to animal (cat) (dog) hair and dander: Secondary | ICD-10-CM

## 2020-04-11 NOTE — Progress Notes (Addendum)
VIALS EXP 04-11-21.ADDITIONAL LABELS NEEDED.

## 2020-04-12 DIAGNOSIS — J3089 Other allergic rhinitis: Secondary | ICD-10-CM

## 2020-04-23 ENCOUNTER — Other Ambulatory Visit: Payer: Self-pay | Admitting: Family Medicine

## 2020-04-23 DIAGNOSIS — E1149 Type 2 diabetes mellitus with other diabetic neurological complication: Secondary | ICD-10-CM

## 2020-04-23 NOTE — Telephone Encounter (Signed)
Requested medication (s) are due for refill today: yes  Requested medication (s) are on the active medication list: yes  Last refill:  03/15/20  Future visit scheduled: yes  Notes to clinic:  please advise to amount to RF considering upcoming appt   Requested Prescriptions  Pending Prescriptions Disp Refills   [name] 18 MG/3ML SOPN [Pharmacy Med Name: Victoza 18 MG/3ML Subcutaneous Solution Pen-injector] 9 mL 0    Sig: INJECT 1.8 MG [place]      Endocrinology:  Diabetes - GLP-1 Receptor Agonists Failed - 04/23/2020  9:39 AM      Failed - Valid encounter within last 6 months    Recent Outpatient Visits           6 months ago Need for hepatitis C screening test   [place], Charlane Ferretti, MD   7 months ago Pain of right calf   [place] Karle Plumber B, MD   9 months ago Type 2 diabetes mellitus with other neurologic complication, without long-term current use of insulin ([place])   [place], [place], MD   11 months ago Moderate episode of recurrent major depressive disorder [facility])   [place], [place] D, LCSW   12 months ago Type 2 diabetes mellitus with other neurologic complication, without long-term current use of insulin ([place])   [place], Charlane Ferretti, MD       Future Appointments             In 1 week Charlott Rakes, MD [place] - HBA1C is between 0 and 7.9 and within 180 days    HbA1c, POC (controlled diabetic range)  Date Value Ref Range Status  07/01/2019 6.4 0.0 - 7.0 % Final   Hgb A1c MFr Bld  Date Value Ref Range Status  10/27/2019 6.2 (H) 4.8 - 5.6 % Final    Comment:             Prediabetes: 5.7 - 6.4          Diabetes: >6.4          Glycemic control for adults with diabetes: <7.0

## 2020-04-25 ENCOUNTER — Ambulatory Visit: Payer: Medicaid Other | Admitting: Neurology

## 2020-04-28 ENCOUNTER — Other Ambulatory Visit: Payer: Self-pay | Admitting: *Deleted

## 2020-04-28 ENCOUNTER — Other Ambulatory Visit: Payer: Self-pay | Admitting: Neurology

## 2020-04-28 DIAGNOSIS — F330 Major depressive disorder, recurrent, mild: Secondary | ICD-10-CM

## 2020-04-28 DIAGNOSIS — E1149 Type 2 diabetes mellitus with other diabetic neurological complication: Secondary | ICD-10-CM

## 2020-04-28 MED ORDER — DULOXETINE HCL 60 MG PO CPEP: 60.0000 mg | ORAL_CAPSULE | Freq: Every day | ORAL | 0 refills | Status: DC

## 2020-05-03 ENCOUNTER — Ambulatory Visit: Payer: Medicaid Other | Admitting: Family Medicine

## 2020-05-06 ENCOUNTER — Other Ambulatory Visit: Payer: Self-pay | Admitting: Neurology

## 2020-05-29 ENCOUNTER — Other Ambulatory Visit: Payer: Self-pay | Admitting: Family Medicine

## 2020-05-29 DIAGNOSIS — E1149 Type 2 diabetes mellitus with other diabetic neurological complication: Secondary | ICD-10-CM

## 2020-05-29 DIAGNOSIS — E039 Hypothyroidism, unspecified: Secondary | ICD-10-CM

## 2020-05-29 NOTE — Telephone Encounter (Signed)
Requested medication (s) are due for refill today: yes  Requested medication (s) are on the active medication list: yes  Last refill:  04/25/20  Future visit scheduled: yes  Notes to clinic:  > 3 months overdue for appt.   Requested Prescriptions  Pending Prescriptions Disp Refills   VICTOZA 18 MG/3ML SOPN [Pharmacy Med Name: Victoza 18 MG/3ML Subcutaneous Solution Pen-injector] 9 mL 0    Sig: INJECT 1.8MG  SUBCUTANEOUSLY ONCE DAILY. MUST KEEP UPCOMING OFFICE VISIT FOR MORE REFILLS      Endocrinology:  Diabetes - GLP-1 Receptor Agonists Failed - 05/29/2020 11:16 AM      Failed - HBA1C is between 0 and 7.9 and within 180 days    HbA1c, POC (controlled diabetic range)  Date Value Ref Range Status  07/01/2019 6.4 0.0 - 7.0 % Final   Hgb A1c MFr Bld  Date Value Ref Range Status  10/27/2019 6.2 (H) 4.8 - 5.6 % Final    Comment:             Prediabetes: 5.7 - 6.4          Diabetes: >6.4          Glycemic control for adults with diabetes: <7.0           Failed - Valid encounter within last 6 months    Recent Outpatient Visits           8 months ago Need for hepatitis C screening test   [place], Charlane Ferretti, MD   9 months ago Pain of right calf   [place] Karle Plumber B, MD   11 months ago Type 2 diabetes mellitus with other neurologic complication, without long-term current use of insulin ([place])   [place], [place], MD   1 year ago Moderate episode of recurrent major depressive disorder [facility])   [place], [place] D, LCSW   1 year ago Type 2 diabetes mellitus with other neurologic complication, without long-term current use of insulin ([place])   [place], [place], MD       Future Appointments             In 1 month Charlott Rakes, MD [place]

## 2020-05-29 NOTE — Telephone Encounter (Signed)
Requested Prescriptions  Pending Prescriptions Disp Refills  . levothyroxine (SYNTHROID) 137 MCG tablet [Pharmacy Med Name: Levothyroxine Sodium 137 MCG Oral Tablet] 90 tablet 0    Sig: TAKE 1 TABLET BY MOUTH ONCE DAILY BEFORE BREAKFAST     Endocrinology:  Hypothyroid Agents Failed - 05/29/2020  4:52 PM      Failed - TSH needs to be rechecked within 3 months after an abnormal result. Refill until TSH is due.      Passed - TSH in normal range and within 360 days    TSH  Date Value Ref Range Status  10/27/2019 0.929 0.450 - 4.500 uIU/mL Final         Passed - Valid encounter within last 12 months    Recent Outpatient Visits          8 months ago Need for hepatitis C screening test   [place], Charlane Ferretti, MD   9 months ago Pain of right calf   [place] Karle Plumber B, MD   11 months ago Type 2 diabetes mellitus with other neurologic complication, without long-term current use of insulin ([place])   [place], Charlane Ferretti, MD   1 year ago Moderate episode of recurrent major depressive disorder [facility])   [place] Lewis, [place] D, LCSW   1 year ago Type 2 diabetes mellitus with other neurologic complication, without long-term current use of insulin ([place])   [place], MD      Future Appointments            In 1 month Charlott Rakes, MD [place]

## 2020-06-23 ENCOUNTER — Ambulatory Visit: Payer: Self-pay | Admitting: *Deleted

## 2020-06-23 DIAGNOSIS — E1149 Type 2 diabetes mellitus with other diabetic neurological complication: Secondary | ICD-10-CM

## 2020-06-23 NOTE — Telephone Encounter (Signed)
Spoke with Pt and provided her with the location to mobile on Monday. Patient stated she would like to keep her appt with Dr. Margarita Rana in June as a follow-up. She also asked that this message still be routed to Dr. Margarita Rana so she is aware because she would really like a referral to be seen by Podiatry.

## 2020-06-23 NOTE — Telephone Encounter (Signed)
Will route to PCP for review. 

## 2020-06-23 NOTE — Telephone Encounter (Signed)
Pt called stating she thinks she has an infection in her toe; left side of her right great is affected; this has been present x 1 week; she has been using peroxide to cleanse it; the area has redness, pus, bleeding, and swelling; her pain is rated 2 out of 10 but 8 out of 10 with pressure; the pt says the bleeding when the area is uncovered; she denies fever; she denies injury; the pt says she is diabetic; the pt also has pain in her right 2nd toe; the pt says she has a problem with ingrown toenails on the right side of her great toe but this is different; recommendations made per nurse triage protocol; she verbalized understanding; the pt is seen at [facility] and there is no availability within the guidelines per protocol; pt encourage to be evaluated at Urgent Care; she says a referral is needed to be seen by podiatry; explained she needs evaluation and call back for follow up appointment and referral; will route to office for notification. Reason for Disposition . Patient sounds very sick or weak to the triager  Answer Assessment - Initial Assessment Questions 1. ONSET: "When did the pain start?"      06/18/20 2. LOCATION: "Where is the pain located?"   (e.g., around nail, entire toe, at foot joint)     Left side of right great toe, and right  3. PAIN: "How bad is the pain?"    (Scale 1-10; or mild, moderate, severe)   -  MILD (1-3): doesn't interfere with normal activities    -  MODERATE (4-7): interferes with normal activities (e.g., work or school) or awakens from sleep, limping    -  SEVERE (8-10): excruciating pain, unable to do any normal activities, unable to walk     2 out of 10 when sitting; 8 out of 10 when pressure applied 4. APPEARANCE: "What does the toe look like?" (e.g., redness, swelling, bruising, pallor)     Redness, swelling, pus, intermittent bleeding 5. CAUSE: "What do you think is causing the toe pain?"     unsure 6. OTHER SYMPTOMS: "Do you have any other  symptoms?" (e.g., leg pain, rash, fever, numbness)    no 7. PREGNANCY: "Is there any chance you are pregnant?" "When was your last menstrual period?"     hysterectomy  Protocols used: TOE PAIN-A-AH

## 2020-06-26 NOTE — Addendum Note (Signed)
Addended byCharlott Rakes on: 06/26/2020 07:59 AM   Modules accepted: Orders

## 2020-06-26 NOTE — Telephone Encounter (Signed)
VM was left informing patient that referral has been placed.

## 2020-06-26 NOTE — Telephone Encounter (Signed)
Referral has been placed. 

## 2020-06-27 ENCOUNTER — Ambulatory Visit: Payer: Medicaid Other | Admitting: Podiatry

## 2020-06-29 ENCOUNTER — Encounter: Payer: Self-pay | Admitting: Podiatry

## 2020-06-29 ENCOUNTER — Ambulatory Visit: Payer: Medicaid Other | Admitting: Podiatry

## 2020-06-29 ENCOUNTER — Other Ambulatory Visit: Payer: Self-pay

## 2020-06-29 DIAGNOSIS — I824Z1 Acute embolism and thrombosis of unspecified deep veins of right distal lower extremity: Secondary | ICD-10-CM

## 2020-06-29 DIAGNOSIS — L03031 Cellulitis of right toe: Secondary | ICD-10-CM

## 2020-06-29 MED ORDER — CLINDAMYCIN HCL 300 MG PO CAPS: 300.0000 mg | ORAL_CAPSULE | Freq: Two times a day (BID) | ORAL | 0 refills | Status: DC

## 2020-06-29 NOTE — Progress Notes (Signed)
  Subjective:  Patient ID: Janet [name], female    DOB: 20-Dec-1970,  MRN: [id number]  Chief Complaint  Patient presents with  . Nail Problem    Ingrown toenail on the right big toe and it has been going on for about 2 weeks   50 y.o. female presents with the above complaint. History confirmed with patient.   Also complains of right leg pain and redness. Denies CP/SOB.  Objective:  Physical Exam: warm, good capillary refill, no trophic changes or ulcerative lesions, normal DP and PT pulses and normal sensory exam.  Painful ingrowing nail at  medial border of the right, hallux; local warmth noted, local erythema noted and serosanguinous drainage noted Right leg posterior warmth, erythema. Negative Homan's sign. Assessment:   1. Cellulitis of toe, right   2. Acute deep vein thrombosis (DVT) of distal vein of right lower extremity ([place])    Plan:  Patient was evaluated and treated and all questions answered.  Paronychia, right -Patient elects to proceed with ingrown toenail removal today -Ingrown nail excised. See procedure note. -Rx Clinda for cellulitis -Educated on post-procedure care including soaking. Written instructions provided. -Patient to follow up in 2 weeks for nail check.  Procedure: I and D of paronychia Location: Right 1st toe  medial border Anesthesia: Lidocaine 1% plain; 24mL, digital block. Skin Prep: Betadine. Dressing: Silvadene; telfa; dry, sterile, compression dressing. Technique: Following skin prep, the toe was exsanguinated and a tourniquet was secured at the base of the toe.  The affected nail border was freed, split with a nail splitter, and excised. A tissue nipper was used to excise the granuloma. The area was cleansed. The tourniquet was then removed and sterile dressing applied. Disposition: Patient tolerated procedure well.   Venous Insufficiency with ?DVT -DVT US Right

## 2020-07-03 ENCOUNTER — Ambulatory Visit: Payer: Medicaid Other | Admitting: Internal Medicine

## 2020-07-06 ENCOUNTER — Telehealth: Payer: Self-pay | Admitting: Podiatry

## 2020-07-06 NOTE — Telephone Encounter (Signed)
Pt went to [facility]  Saturday & they did u/s to rule our DVT-pt states was negative. Was given bactrim & cefalexin to take.  Was given dx of cellulitis/UTI.  Pt still having a lot of swelling and pain.  Has appt 07-13-20 to see you for f/u.  pls advise

## 2020-07-06 NOTE — Telephone Encounter (Signed)
Advise her to elevate, ice and we can see her as scheduled

## 2020-07-07 ENCOUNTER — Telehealth: Payer: Self-pay | Admitting: Neurology

## 2020-07-07 NOTE — Telephone Encounter (Signed)
Pt called wanting to know if she can get a letter for her landlord. Pt states the landlord is trying to put her in a townhouse and she is needing the letter to show that she would have trouble going up the stairs. Please advise.

## 2020-07-11 ENCOUNTER — Ambulatory Visit: Payer: Medicaid Other | Admitting: Neurology

## 2020-07-11 NOTE — Telephone Encounter (Signed)
Spoke to pt and made her appt for tomorrow at 1515 (moved up).

## 2020-07-12 ENCOUNTER — Ambulatory Visit: Payer: Self-pay | Admitting: Neurology

## 2020-07-13 ENCOUNTER — Ambulatory Visit (INDEPENDENT_AMBULATORY_CARE_PROVIDER_SITE_OTHER): Payer: Medicaid Other | Admitting: Podiatry

## 2020-07-13 ENCOUNTER — Ambulatory Visit (INDEPENDENT_AMBULATORY_CARE_PROVIDER_SITE_OTHER): Payer: Medicaid Other | Admitting: *Deleted

## 2020-07-13 ENCOUNTER — Other Ambulatory Visit: Payer: Self-pay

## 2020-07-13 ENCOUNTER — Encounter: Payer: Self-pay | Admitting: Podiatry

## 2020-07-13 DIAGNOSIS — J309 Allergic rhinitis, unspecified: Secondary | ICD-10-CM

## 2020-07-13 DIAGNOSIS — I824Z1 Acute embolism and thrombosis of unspecified deep veins of right distal lower extremity: Secondary | ICD-10-CM | POA: Diagnosis not present

## 2020-07-13 DIAGNOSIS — L03031 Cellulitis of right toe: Secondary | ICD-10-CM

## 2020-07-13 NOTE — Progress Notes (Signed)
  Subjective:  Patient ID: [name], female    DOB: Dec 10, 1970,  MRN: [id number]  Chief Complaint  Patient presents with  . Nail Problem    The right big toenail is doing better and I had to go to the ER Friday night and Saturday and did a DVT and it was negative and I did have a UTI    50 y.o. female presents for follow up of nail procedure. History confirmed with patient. States her DVT US was negative and the antibiotics have helped her cellulitis.  Objective:  Physical Exam: Ingrown nail avulsion site: overlying soft crust, no warmth, no drainage and no erythema Assessment:   1. Cellulitis of toe, right   2. Acute deep vein thrombosis (DVT) of distal vein of right lower extremity (HCC)   3. Paronychia, toe, right    Plan:  Patient was evaluated and treated and all questions answered.  S/p Ingrown Toenail Excision, right -Healing well without issue. -Discussed return precautions.  Venous Insufficiency -Recc compression socks.

## 2020-07-14 ENCOUNTER — Telehealth: Payer: Self-pay | Admitting: Hematology and Oncology

## 2020-07-14 NOTE — Telephone Encounter (Signed)
Ed Referral - Ulla Gallo, DO for Anemia/Thrombocytopenia.  Appt made for 07/26/20 Consult 10:40 am

## 2020-07-17 ENCOUNTER — Ambulatory Visit: Payer: Medicaid Other | Admitting: Family Medicine

## 2020-07-19 ENCOUNTER — Other Ambulatory Visit: Payer: Self-pay | Admitting: Family Medicine

## 2020-07-19 DIAGNOSIS — E1149 Type 2 diabetes mellitus with other diabetic neurological complication: Secondary | ICD-10-CM

## 2020-07-19 NOTE — Telephone Encounter (Signed)
Courtesy refill. Patient must keep appointment for further refills. Requested Prescriptions  Pending Prescriptions Disp Refills  . atorvastatin (LIPITOR) 20 MG tablet [Pharmacy Med Name: Atorvastatin Calcium 20 MG Oral Tablet] 30 tablet 0    Sig: Take 1 tablet by mouth once daily     Cardiovascular:  Antilipid - Statins Failed - 07/19/2020  6:55 PM      Failed - Total Cholesterol in normal range and within 360 days    Cholesterol, Total  Date Value Ref Range Status  07/01/2019 101 100 - 199 mg/dL Final         Failed - LDL in normal range and within 360 days    LDL Chol Calc (NIH)  Date Value Ref Range Status  07/01/2019 29 0 - 99 mg/dL Final         Failed - HDL in normal range and within 360 days    HDL  Date Value Ref Range Status  07/01/2019 57 >39 mg/dL Final         Failed - Triglycerides in normal range and within 360 days    Triglycerides  Date Value Ref Range Status  07/01/2019 70 0 - 149 mg/dL Final         Passed - Patient is not pregnant      Passed - Valid encounter within last 12 months    Recent Outpatient Visits          9 months ago Need for hepatitis C screening test   [place], Charlane Ferretti, MD   10 months ago Pain of right calf   [place], Deborah B, MD   1 year ago Type 2 diabetes mellitus with other neurologic complication, without long-term current use of insulin ([place])   [place], [place], MD   1 year ago Moderate episode of recurrent major depressive disorder [facility])   [place], [place] D, LCSW   1 year ago Type 2 diabetes mellitus with other neurologic complication, without long-term current use of insulin ([place])   [place], Enobong, MD      Future Appointments            In 1 month [place], Dionne Bucy, PA-C [place]

## 2020-07-26 ENCOUNTER — Inpatient Hospital Stay: Payer: Medicaid Other | Admitting: Hematology and Oncology

## 2020-08-02 ENCOUNTER — Encounter: Payer: Medicaid Other | Admitting: Hematology and Oncology

## 2020-08-04 ENCOUNTER — Other Ambulatory Visit: Payer: Self-pay | Admitting: Neurology

## 2020-08-04 ENCOUNTER — Other Ambulatory Visit: Payer: Self-pay | Admitting: Family Medicine

## 2020-08-04 DIAGNOSIS — J4540 Moderate persistent asthma, uncomplicated: Secondary | ICD-10-CM

## 2020-08-04 DIAGNOSIS — E1149 Type 2 diabetes mellitus with other diabetic neurological complication: Secondary | ICD-10-CM

## 2020-08-04 NOTE — Telephone Encounter (Signed)
}    Notes to clinic: requested script has expired  Review for refill    Requested Prescriptions  Pending Prescriptions Disp Refills   montelukast (SINGULAIR) 10 MG tablet [Pharmacy Med Name: Montelukast Sodium 10 MG Oral Tablet] 90 tablet 0    Sig: TAKE 1 TABLET BY MOUTH AT BEDTIME      Pulmonology:  Leukotriene Inhibitors Passed - 08/04/2020  9:44 AM      Passed - Valid encounter within last 12 months    Recent Outpatient Visits           10 months ago Need for hepatitis C screening test   [place], Charlane Ferretti, MD   11 months ago Pain of right calf   [place], Deborah B, MD   1 year ago Type 2 diabetes mellitus with other neurologic complication, without long-term current use of insulin ([place])   [place], Charlane Ferretti, MD   1 year ago Moderate episode of recurrent major depressive disorder [facility])   [place], [place] D, LCSW   1 year ago Type 2 diabetes mellitus with other neurologic complication, without long-term current use of insulin [facility])   [place], Enobong, MD       Future Appointments             In 2 weeks [place], Dionne Bucy, PA-C [place]

## 2020-08-07 ENCOUNTER — Other Ambulatory Visit: Payer: Self-pay | Admitting: Family Medicine

## 2020-08-07 DIAGNOSIS — E1149 Type 2 diabetes mellitus with other diabetic neurological complication: Secondary | ICD-10-CM

## 2020-08-07 NOTE — Telephone Encounter (Signed)
   Notes to clinic:  Patient has appt scheduled for 08/23/2020 Review for supply until that time    Requested Prescriptions  Pending Prescriptions Disp Refills   [place] 18 MG/3ML SOPN [Pharmacy Med Name: Victoza 18 MG/3ML Subcutaneous Solution Pen-injector] 9 mL 0    Sig: INJECT 1.8 MG SUBCUTANEOUSLY ONCE DAILY. MUST KEEP UPCOMING OFFICE VISITY FOR MORE REFILLS      Endocrinology:  Diabetes - GLP-1 Receptor Agonists Failed - 08/07/2020 10:34 AM      Failed - HBA1C is between 0 and 7.9 and within 180 days    HbA1c, POC (controlled diabetic range)  Date Value Ref Range Status  07/01/2019 6.4 0.0 - 7.0 % Final   Hgb A1c MFr Bld  Date Value Ref Range Status  10/27/2019 6.2 (H) 4.8 - 5.6 % Final    Comment:             Prediabetes: 5.7 - 6.4          Diabetes: >6.4          Glycemic control for adults with diabetes: <7.0           Failed - Valid encounter within last 6 months    Recent Outpatient Visits           10 months ago Need for hepatitis C screening test   [place], Charlane Ferretti, MD   11 months ago Pain of right calf   [place], Deborah B, MD   1 year ago Type 2 diabetes mellitus with other neurologic complication, without long-term current use of insulin ([place])   [place], [place], MD   1 year ago Moderate episode of recurrent major depressive disorder  [facility])   [place], Erda D, LCSW   1 year ago Type 2 diabetes mellitus with other neurologic complication, without long-term current use of insulin ([place])   [place], Enobong, MD       Future Appointments             In 2 weeks [place], Dionne Bucy, PA-C [place]

## 2020-08-08 ENCOUNTER — Telehealth: Payer: Self-pay | Admitting: Oncology

## 2020-08-08 NOTE — Telephone Encounter (Signed)
Patient rescheduled from 6/29 New Patient Consult with Dr Alvy Bimler due to being called to work at another faciltiy  Patient rerferred by Dr Ulla Gallo for Anemia/Thrombocytopenia.  Appt made for 7/1 Labs 10:30 am - Consult 11:00 am

## 2020-08-09 ENCOUNTER — Inpatient Hospital Stay: Payer: Medicaid Other | Admitting: Hematology and Oncology

## 2020-08-11 ENCOUNTER — Inpatient Hospital Stay: Payer: Medicaid Other

## 2020-08-11 ENCOUNTER — Telehealth: Payer: Self-pay | Admitting: Oncology

## 2020-08-11 ENCOUNTER — Encounter: Payer: Self-pay | Admitting: Oncology

## 2020-08-11 ENCOUNTER — Other Ambulatory Visit: Payer: Self-pay | Admitting: Oncology

## 2020-08-11 ENCOUNTER — Other Ambulatory Visit: Payer: Self-pay

## 2020-08-11 ENCOUNTER — Inpatient Hospital Stay: Payer: Medicaid Other | Attending: Hematology and Oncology | Admitting: Oncology

## 2020-08-11 VITALS — BP 177/70 | HR 93 | Temp 98.4°F | Resp 18 | Ht 67.5 in | Wt >= 6400 oz

## 2020-08-11 DIAGNOSIS — Z79899 Other long term (current) drug therapy: Secondary | ICD-10-CM | POA: Insufficient documentation

## 2020-08-11 DIAGNOSIS — D696 Thrombocytopenia, unspecified: Secondary | ICD-10-CM | POA: Diagnosis not present

## 2020-08-11 DIAGNOSIS — Z7984 Long term (current) use of oral hypoglycemic drugs: Secondary | ICD-10-CM | POA: Insufficient documentation

## 2020-08-11 DIAGNOSIS — I10 Essential (primary) hypertension: Secondary | ICD-10-CM | POA: Diagnosis not present

## 2020-08-11 DIAGNOSIS — D61818 Other pancytopenia: Secondary | ICD-10-CM

## 2020-08-11 DIAGNOSIS — D649 Anemia, unspecified: Secondary | ICD-10-CM

## 2020-08-11 DIAGNOSIS — E039 Hypothyroidism, unspecified: Secondary | ICD-10-CM | POA: Diagnosis not present

## 2020-08-11 DIAGNOSIS — E119 Type 2 diabetes mellitus without complications: Secondary | ICD-10-CM | POA: Diagnosis not present

## 2020-08-11 LAB — CBC AND DIFFERENTIAL
HCT: 33 — AB (ref 36–46)
Hemoglobin: 10.7 — AB (ref 12.0–16.0)
Neutrophils Absolute: 1.49
Platelets: 83 — AB (ref 150–399)
WBC: 2.7

## 2020-08-11 LAB — COMPREHENSIVE METABOLIC PANEL
Albumin: 3.7 (ref 3.5–5.0)
Calcium: 8.9 (ref 8.7–10.7)

## 2020-08-11 LAB — BASIC METABOLIC PANEL
BUN: 7 (ref 4–21)
CO2: 20 (ref 13–22)
Chloride: 108 (ref 99–108)
Creatinine: 0.5 (ref 0.5–1.1)
Glucose: 195
Potassium: 3.9 (ref 3.4–5.3)
Sodium: 139 (ref 137–147)

## 2020-08-11 LAB — HEPATIC FUNCTION PANEL
ALT: 31 (ref 7–35)
AST: 61 — AB (ref 13–35)
Alkaline Phosphatase: 162 — AB (ref 25–125)
Bilirubin, Total: 1.8

## 2020-08-11 LAB — IRON AND TIBC
Iron: 63 ug/dL (ref 28–170)
Saturation Ratios: 14 % (ref 10.4–31.8)
TIBC: 460 ug/dL — ABNORMAL HIGH (ref 250–450)
UIBC: 397 ug/dL

## 2020-08-11 LAB — FOLATE: Folate: 30.6 ng/mL (ref >5.9)

## 2020-08-11 LAB — FERRITIN: Ferritin: 12 ng/mL (ref 11–307)

## 2020-08-11 LAB — CBC: RBC: 4.17 (ref 3.87–5.11)

## 2020-08-11 LAB — VITAMIN B12: Vitamin B-12: 958 pg/mL — ABNORMAL HIGH (ref 180–914)

## 2020-08-11 NOTE — Telephone Encounter (Signed)
Per 7/1 LOS, patient scheduled for 7/22 Appt's.  Gave patient Appt Summary

## 2020-08-15 LAB — PROTEIN ELECTROPHORESIS, SERUM
A/G Ratio: 1.1 (ref 0.7–1.7)
Albumin ELP: 3.5 g/dL (ref 2.9–4.4)
Alpha-1-Globulin: 0.1 g/dL (ref 0.0–0.4)
Alpha-2-Globulin: 0.4 g/dL (ref 0.4–1.0)
Beta Globulin: 1 g/dL (ref 0.7–1.3)
Gamma Globulin: 1.6 g/dL (ref 0.4–1.8)
Globulin, Total: 3.2 g/dL (ref 2.2–3.9)
Total Protein ELP: 6.7 g/dL (ref 6.0–8.5)

## 2020-08-17 ENCOUNTER — Telehealth: Payer: Self-pay | Admitting: *Deleted

## 2020-08-17 ENCOUNTER — Other Ambulatory Visit: Payer: Self-pay | Admitting: Gynecologic Oncology

## 2020-08-17 DIAGNOSIS — R599 Enlarged lymph nodes, unspecified: Secondary | ICD-10-CM

## 2020-08-17 NOTE — Telephone Encounter (Signed)
Returned the patient's call and stated that "Dr Denman George wanted to get labs and a CT before her appt. What days are good for the appts." Patient stated that she is out of town from 7/14 thru 7/24. Explained that the office will work on the apptas and call her tomorrow

## 2020-08-18 NOTE — Telephone Encounter (Signed)
Scheduled the patient for a CT scan (7/25)  and MD visit (7/27). Called and left the patient a message with the appt information, message stated to stop by the hospital to pick up contrast. Ask the patient to call back to confirm she received the message

## 2020-08-20 NOTE — Progress Notes (Signed)
[facility]  [address] [place],  [place]  [address] [phone]  Clinic Day:  08/11/2020  Referring physician: Charlott Rakes, MD   HISTORY OF PRESENT ILLNESS:  The patient is a 50 y.o. female  who I was asked to consult upon for anemia and thrombocytopenia.  Recently, while being evaluated for cellulitis in the emergency room, labs showed a low hemoglobin of 10.4 and a low platelet count of 111.  According to the patient, she recalls being told years ago that some of her blood counts were low when she underwent a hysterectomy.  She denies being placed on any new medications recently which have the ability to cause cytopenias via bone marrow suppression.  She denies having any B symptoms which concern her for her cytopenias being due to an underlying hematologic malignancy.  To her knowledge, there is no family history of any blood disorders.    PAST MEDICAL HISTORY:   Past Medical History:  Diagnosis Date   Anemia    Angio-edema    Anxiety    Arthritis    Asthma    Depression    Diabetes mellitus without complication (HCC)    Hypertension    Hypothyroidism    Pre-diabetes    Right foot drop    Sleep apnea    Thyroid disease    Urticaria    Uterine cancer ([place])     PAST SURGICAL HISTORY:   Past Surgical History:  Procedure Laterality Date   ABDOMINAL HYSTERECTOMY     CARPAL TUNNEL RELEASE     CESAREAN SECTION     CESAREAN SECTION N/A    Phreesia 03/14/2020   CHOLECYSTECTOMY     cyst removal     uterus   DILATION AND CURETTAGE OF UTERUS     still birth   DILATION AND CURETTAGE OF UTERUS N/A 06/11/2016   Procedure: DILATATION AND CURETTAGE;  Surgeon: Emily Filbert, MD;  Location: [facility];  Service: Gynecology;  Laterality: N/A;   ROBOTIC ASSISTED TOTAL HYSTERECTOMY WITH BILATERAL SALPINGO OOPHERECTOMY Bilateral 07/09/2016   Procedure: XI ROBOTIC ASSISTED TOTAL HYSTERECTOMY WITH BILATERAL SALPINGO OOPHORECTOMY FOR UTERUS GREATER THAN  250 GRAMS, LYSIS OF ADHESIONS;  Surgeon: Everitt Amber, MD;  Location: [facility];  Service: Gynecology;  Laterality: Bilateral;   SINOSCOPY     SINUS EXPLORATION     WISDOM TOOTH EXTRACTION      CURRENT MEDICATIONS:   Current Outpatient Medications  Medication Sig Dispense Refill   albuterol (PROVENTIL) (2.5 MG/3ML) 0.083% nebulizer solution Take 3 mLs (2.5 mg total) by nebulization every 6 (six) hours as needed for wheezing or shortness of breath. 75 mL 3   albuterol (VENTOLIN HFA) 108 (90 Base) MCG/ACT inhaler Inhale 1-2 puffs into the lungs every 6 (six) hours as needed for wheezing or shortness of breath. 54 g 3   amoxicillin (AMOXIL) 500 MG capsule Take 1 capsule (500 mg total) by mouth 3 (three) times daily. 30 capsule 0   atorvastatin (LIPITOR) 20 MG tablet Take 1 tablet by mouth once daily 30 tablet 0   BD PEN NEEDLE NANO 2ND GEN 32G X 4 MM MISC USE AS DIRECTED 100 each 0   budesonide-formoterol (SYMBICORT) 160-4.5 MCG/ACT inhaler Inhale 2 puffs into the lungs 2 (two) times daily. 1 Inhaler 5   cephALEXin (KEFLEX) 500 MG capsule Take 500 mg by mouth 2 (two) times daily.     cholecalciferol (VITAMIN D) 1000 units tablet Take 1,000 Units by mouth at bedtime.  clindamycin (CLEOCIN) 300 MG capsule Take 1 capsule (300 mg total) by mouth 2 (two) times daily. 6 capsule 0   cyclobenzaprine (FLEXERIL) 5 MG tablet Take 1 tablet (5 mg total) by mouth 2 (two) times daily. 30 tablet 0   diazepam (VALIUM) 5 MG tablet Take 1 tablet (5 mg total) by mouth every 6 (six) hours as needed for anxiety. (Patient not taking: Reported on 09/28/2019) 2 tablet 0   doxycycline (MONODOX) 100 MG capsule Take 100 mg by mouth 2 (two) times daily.     doxycycline (VIBRA-TABS) 100 MG tablet Take 100 mg by mouth 2 (two) times daily.     DULoxetine (CYMBALTA) 60 MG capsule Take 1 capsule by mouth once daily 90 capsule 0   DULoxetine (CYMBALTA) 60 MG capsule Take 1 capsule (60 mg total) by mouth daily. 90 capsule 0    dupilumab (DUPIXENT) 300 MG/2ML prefilled syringe Inject 300 mg into the skin every 14 (fourteen) days. 4 mL 11   EPINEPHrine 0.3 mg/0.3 mL IJ SOAJ injection Use as directed for life threatening allergic reactions 2 each 3   EQ ALLERGY RELIEF, CETIRIZINE, 10 MG tablet Take 1 tablet by mouth once daily 90 tablet 0   fluticasone (FLONASE) 50 MCG/ACT nasal spray Place 2 sprays into both nostrils daily. 16 g 6   furosemide (LASIX) 20 MG tablet Take 1 tablet (20 mg total) by mouth daily as needed. 30 tablet 0   glipiZIDE (GLUCOTROL) 10 MG tablet Take 2 tablets (20 mg total) by mouth 2 (two) times daily before a meal. 360 tablet 1   levothyroxine (SYNTHROID) 137 MCG tablet TAKE 1 TABLET BY MOUTH ONCE DAILY BEFORE BREAKFAST 90 tablet 0   losartan (COZAAR) 50 MG tablet Take 1 tablet by mouth once daily 90 tablet 0   metFORMIN (GLUCOPHAGE) 500 MG tablet Take 2 tablets (1,000 mg total) by mouth 2 (two) times daily with a meal. 360 tablet 1   montelukast (SINGULAIR) 10 MG tablet TAKE 1 TABLET BY MOUTH AT BEDTIME 30 tablet 0   Multiple Vitamin (MULTIVITAMIN) tablet Take 1 tablet by mouth daily.     mupirocin ointment (BACTROBAN) 2 % Apply 1 application topically 3 (three) times daily.     omeprazole (PRILOSEC) 40 MG capsule Take 1 capsule by mouth once daily 30 capsule 3   OXcarbazepine (TRILEPTAL) 150 MG tablet Take 1 tablet by mouth twice daily 60 tablet 3   Phentermine HCl (LOMAIRA) 8 MG TABS Take by mouth.     pregabalin (LYRICA) 75 MG capsule Take 1 capsule by mouth twice daily 180 capsule 0   sulfamethoxazole-trimethoprim (BACTRIM DS) 800-160 MG tablet Take 1 tablet by mouth 2 (two) times daily.     traMADol (ULTRAM) 50 MG tablet Take 50 mg by mouth every 6 (six) hours as needed.     triamcinolone cream (KENALOG) 0.1 % Apply 1 application topically 2 (two) times daily. 80 g 0   VICTOZA 18 MG/3ML SOPN INJECT 1.8 MG SUBCUTANEOUSLY ONCE DAILY. MUST KEEP UPCOMING OFFICE VISITY FOR MORE REFILLS 9 mL 0   No  current facility-administered medications for this visit.    ALLERGIES:   Allergies  Allergen Reactions   Ciprofloxacin Shortness Of Breath   Shrimp [Shellfish Allergy] Other (See Comments)    positive allergy test    FAMILY HISTORY:   Family History  Problem Relation Age of Onset   Diabetes Mother    Heart attack Mother    Allergic rhinitis Mother    Suicidality Father  Hyperlipidemia Father     SOCIAL HISTORY:  The patient was born and raised in [place].  She lives in [place].  She is widowed, with 1 child.  She was a Freight forwarder.  She is currently on disability.  There is no history of alcoholism or tobacco abuse.    REVIEW OF SYSTEMS:  Review of Systems  Constitutional:  Positive for fatigue. Negative for fever.  HENT:   Negative for hearing loss and sore throat.   Eyes:  Negative for eye problems.  Respiratory:  Negative for chest tightness, cough and hemoptysis.   Cardiovascular:  Negative for chest pain and palpitations.  Gastrointestinal:  Negative for abdominal distention, abdominal pain, blood in stool, constipation, diarrhea, nausea and vomiting.  Endocrine: Negative for hot flashes.  Genitourinary:  Negative for difficulty urinating, dysuria, frequency, hematuria and nocturia.   Musculoskeletal:  Negative for arthralgias, back pain, gait problem and myalgias.       Right knee arthritis  Skin: Negative.  Negative for itching and rash.  Neurological: Negative.  Negative for dizziness, extremity weakness, gait problem, headaches, light-headedness and numbness.       Right foot drop  Hematological: Negative.   Psychiatric/Behavioral: Negative.  Negative for depression and suicidal ideas. The patient is not nervous/anxious.     PHYSICAL EXAM:  Blood pressure (!) 177/70, pulse 93, temperature 98.4 F (36.9 C), resp. rate 18, height 5' 7.5" (1.715 m), weight (!) 429 lb 1.6 oz (194.6 kg), SpO2 96 %. Wt Readings from Last 3 Encounters:  08/11/20 (!) 429  lb 1.6 oz (194.6 kg)  09/03/19 (!) 408 lb 9.6 oz (185.3 kg)  07/01/19 (!) 403 lb (182.8 kg)   Body mass index is 66.21 kg/m. Performance status (ECOG): 1 - Symptomatic but completely ambulatory Physical Exam Constitutional:      Appearance: Normal appearance. She is not ill-appearing.  HENT:     Mouth/Throat:     Mouth: Mucous membranes are moist.     Pharynx: Oropharynx is clear. No oropharyngeal exudate or posterior oropharyngeal erythema.  Cardiovascular:     Rate and Rhythm: Normal rate and regular rhythm.     Heart sounds: No murmur heard.   No friction rub. No gallop.  Pulmonary:     Effort: Pulmonary effort is normal. No respiratory distress.     Breath sounds: Normal breath sounds. No wheezing, rhonchi or rales.  Chest:  Breasts:    Right: No axillary adenopathy or supraclavicular adenopathy.     Left: No axillary adenopathy or supraclavicular adenopathy.  Abdominal:     General: Bowel sounds are normal. There is no distension.     Palpations: Abdomen is soft. There is no mass.     Tenderness: There is no abdominal tenderness.  Musculoskeletal:        General: No swelling.     Right lower leg: No edema.     Left lower leg: No edema.  Lymphadenopathy:     Cervical: No cervical adenopathy.     Upper Body:     Right upper body: No supraclavicular or axillary adenopathy.     Left upper body: No supraclavicular or axillary adenopathy.     Lower Body: No right inguinal adenopathy. No left inguinal adenopathy.  Skin:    General: Skin is warm.     Coloration: Skin is not jaundiced.     Findings: No lesion or rash.  Neurological:     General: No focal deficit present.     Mental Status: She  is alert and oriented to person, place, and time. Mental status is at baseline.     Cranial Nerves: Cranial nerves are intact.  Psychiatric:        Mood and Affect: Mood normal.        Behavior: Behavior normal.        Thought Content: Thought content normal.    LABS:   CBC  Latest Ref Rng & Units 08/11/2020 12/08/2018 10/15/2017  WBC - 2.7 5.7 2.7(L)  Hemoglobin 12.0 - 16.0 10.7(A) 15.0 12.0  Hematocrit 36 - 46 33(A) 43.4 37.2  Platelets 150 - 399 83(A) 84(LL) 87(LL)   CMP Latest Ref Rng & Units 08/11/2020 10/27/2019 07/01/2019  Glucose 65 - 99 mg/dL - 110(H) 127(H)  BUN 4 - _0 Creatinine 0.5 - 1.1 0.5 0.84 0.68  Sodium 137 - 147 139 141 143  Potassium 3.4 - 5.3 3.9 4.1 4.0  Chloride 99 - 108 108 104 106  CO2 13 - _1 Calcium 8.7 - 10.7 8.9 9.1 8.9  Total Protein 6.0 - 8.5 g/dL - - 6.6  Total Bilirubin 0.0 - 1.2 mg/dL - - 1.5(H)  Alkaline Phos 25 - 125 162(A) - 153(H)  AST 13 - 35 61(A) - 51(H)  ALT 7 - 35 31 - 26   ASSESSMENT & PLAN:  A 50 y.o. female who I was asked to consult upon for anemia and thrombocytopenia.  When evaluating her peripheral counts today, she is actually pancytopenic.  I will check her iron, B12 and folate levels to ensure she has no nutritional deficiencies factoring into her low counts.  A serum protein electrophoresis will be checked to ensure an underlying plasma cell dyscrasia, such as multiple myeloma, is not factoring into her low counts.  As some of her liver enzymes are abnormal, it does raise the suspicion for hepatosplenic disease.  Of note, a CT scan of her abdomen/pelvis in May 2018 showed splenomegaly.  She ultimately may need to have her abdomen scanned again to determine if her splenic size has gotten even larger.  I will see this patient back in 3 weeks to go over her labs collected today and their implications.  The patient understands all the plans discussed today and is in agreement with them.  I do appreciate Charlott Rakes, MD for his new consult.   Zamantha Strebel Macarthur Critchley, MD

## 2020-08-21 ENCOUNTER — Other Ambulatory Visit: Payer: Self-pay | Admitting: Family Medicine

## 2020-08-21 ENCOUNTER — Other Ambulatory Visit: Payer: Self-pay | Admitting: Neurology

## 2020-08-21 DIAGNOSIS — E1149 Type 2 diabetes mellitus with other diabetic neurological complication: Secondary | ICD-10-CM

## 2020-08-21 DIAGNOSIS — F330 Major depressive disorder, recurrent, mild: Secondary | ICD-10-CM

## 2020-08-23 ENCOUNTER — Other Ambulatory Visit: Payer: Self-pay

## 2020-08-23 ENCOUNTER — Ambulatory Visit: Payer: Medicaid Other | Attending: Family Medicine | Admitting: Physician Assistant

## 2020-08-23 ENCOUNTER — Encounter: Payer: Self-pay | Admitting: Physician Assistant

## 2020-08-23 VITALS — BP 141/74 | HR 101 | Ht 67.5 in | Wt >= 6400 oz

## 2020-08-23 DIAGNOSIS — Z7984 Long term (current) use of oral hypoglycemic drugs: Secondary | ICD-10-CM | POA: Diagnosis not present

## 2020-08-23 DIAGNOSIS — Z794 Long term (current) use of insulin: Secondary | ICD-10-CM | POA: Diagnosis not present

## 2020-08-23 DIAGNOSIS — Z79890 Hormone replacement therapy (postmenopausal): Secondary | ICD-10-CM | POA: Insufficient documentation

## 2020-08-23 DIAGNOSIS — E1149 Type 2 diabetes mellitus with other diabetic neurological complication: Secondary | ICD-10-CM | POA: Diagnosis present

## 2020-08-23 DIAGNOSIS — R748 Abnormal levels of other serum enzymes: Secondary | ICD-10-CM | POA: Diagnosis not present

## 2020-08-23 DIAGNOSIS — E039 Hypothyroidism, unspecified: Secondary | ICD-10-CM

## 2020-08-23 DIAGNOSIS — J302 Other seasonal allergic rhinitis: Secondary | ICD-10-CM

## 2020-08-23 DIAGNOSIS — F330 Major depressive disorder, recurrent, mild: Secondary | ICD-10-CM

## 2020-08-23 DIAGNOSIS — J4540 Moderate persistent asthma, uncomplicated: Secondary | ICD-10-CM

## 2020-08-23 DIAGNOSIS — I10 Essential (primary) hypertension: Secondary | ICD-10-CM | POA: Diagnosis not present

## 2020-08-23 DIAGNOSIS — Z79899 Other long term (current) drug therapy: Secondary | ICD-10-CM | POA: Insufficient documentation

## 2020-08-23 DIAGNOSIS — Z881 Allergy status to other antibiotic agents status: Secondary | ICD-10-CM | POA: Insufficient documentation

## 2020-08-23 LAB — POCT GLYCOSYLATED HEMOGLOBIN (HGB A1C): HbA1c, POC (controlled diabetic range): 7.2 % — AB (ref 0.0–7.0)

## 2020-08-23 LAB — GLUCOSE, POCT (MANUAL RESULT ENTRY): POC Glucose: 144 mg/dl — AB (ref 70–99)

## 2020-08-23 MED ORDER — OMEPRAZOLE 40 MG PO CPDR: 40.0000 mg | DELAYED_RELEASE_CAPSULE | Freq: Every day | ORAL | 3 refills | Status: AC

## 2020-08-23 MED ORDER — DULOXETINE HCL 60 MG PO CPEP: 60.0000 mg | ORAL_CAPSULE | Freq: Every day | ORAL | 5 refills | Status: AC

## 2020-08-23 MED ORDER — LOSARTAN POTASSIUM 50 MG PO TABS: 75.0000 mg | ORAL_TABLET | Freq: Every day | ORAL | 0 refills | Status: DC

## 2020-08-23 MED ORDER — LORAZEPAM 0.5 MG PO TABS: 0.5000 mg | ORAL_TABLET | Freq: Two times a day (BID) | ORAL | 0 refills | Status: AC | PRN

## 2020-08-23 MED ORDER — METFORMIN HCL 500 MG PO TABS: 1000.0000 mg | ORAL_TABLET | Freq: Two times a day (BID) | ORAL | 1 refills | Status: AC

## 2020-08-23 MED ORDER — FLUTICASONE PROPIONATE 50 MCG/ACT NA SUSP: 2.0000 | Freq: Every day | NASAL | 6 refills | Status: AC

## 2020-08-23 MED ORDER — DULOXETINE HCL 60 MG PO CPEP: 60.0000 mg | ORAL_CAPSULE | Freq: Every day | ORAL | 0 refills | Status: DC

## 2020-08-23 MED ORDER — BUDESONIDE-FORMOTEROL FUMARATE 160-4.5 MCG/ACT IN AERO: 2.0000 | INHALATION_SPRAY | Freq: Two times a day (BID) | RESPIRATORY_TRACT | 5 refills | Status: AC

## 2020-08-23 MED ORDER — GLIPIZIDE 10 MG PO TABS: 20.0000 mg | ORAL_TABLET | Freq: Two times a day (BID) | ORAL | 1 refills | Status: AC

## 2020-08-23 MED ORDER — LEVOTHYROXINE SODIUM 137 MCG PO TABS: 137.0000 ug | ORAL_TABLET | Freq: Every day | ORAL | 1 refills | Status: DC

## 2020-08-23 MED ORDER — MONTELUKAST SODIUM 10 MG PO TABS: 10.0000 mg | ORAL_TABLET | Freq: Every day | ORAL | 4 refills | Status: AC

## 2020-08-23 MED ORDER — ALBUTEROL SULFATE HFA 108 (90 BASE) MCG/ACT IN AERS: 1.0000 | INHALATION_SPRAY | Freq: Four times a day (QID) | RESPIRATORY_TRACT | 3 refills | Status: AC | PRN

## 2020-08-23 MED ORDER — ATORVASTATIN CALCIUM 20 MG PO TABS: ORAL_TABLET | ORAL | 5 refills | Status: AC

## 2020-08-23 MED ORDER — VICTOZA 18 MG/3ML ~~LOC~~ SOPN: PEN_INJECTOR | SUBCUTANEOUS | 3 refills | Status: AC

## 2020-08-23 MED ORDER — BD PEN NEEDLE NANO 2ND GEN 32G X 4 MM MISC: 5 refills | Status: AC

## 2020-08-23 NOTE — Progress Notes (Signed)
Discuss weight loss. Medication refills.

## 2020-08-23 NOTE — Progress Notes (Signed)
Patient ID: Janet [name], female   DOB: 12/24/1970, 50 y.o.   MRN: [id number]   Janet [name], is a 50 y.o. female  JEH:[id number]  [id number]  DOB - [birth year]  Subjective:  Chief Complaint and HPI: Janet [name] is a 50 y.o. female here for med RF.  She saw hematology/oncology earlier this month for work up of pancytopenia.  She has follow up with them in 2 weeks.    She is having trouble losing weight.  She says her blood sugars were better until the previous couple of months when she was in and out of the hospital.  In the past 2-3 weeks  blood sugars have been in the low to mid 100s.    I confirmed with her glipizide 20mg  bid per her PCP.    She needs a note saying she needs a single floor apartment due to difficulty getting around due to health issues.    She wants a pill for weight loss.    She would like to have a few ativan to have on hand for panic attacks.  She has had the same prescription of ativan and rarely uses them(~1 every few months)  ROS:   Constitutional:  No f/c, No night sweats, No unexplained weight loss. EENT:  No vision changes, No blurry vision, No hearing changes. No mouth, throat, or ear problems.  Respiratory: No cough, No SOB Cardiac: No CP, no palpitations GI:  No abd pain, No N/V/D. GU: No Urinary s/sx Musculoskeletal: back and knee pain Neuro: No headache, no dizziness, no motor weakness.  Skin: No rash Endocrine:  No polydipsia. No polyuria.  Psych: Denies SI/HI  No problems updated.  ALLERGIES: Allergies  Allergen Reactions   Ciprofloxacin Shortness Of Breath   Shrimp [Shellfish Allergy] Other (See Comments)    positive allergy test    PAST MEDICAL HISTORY: Past Medical History:  Diagnosis Date   Anemia    Angio-edema    Anxiety    Arthritis    Asthma    Depression    Diabetes mellitus without complication (HCC)    Hypertension    Hypothyroidism    Pre-diabetes    Right foot drop    Sleep apnea    Thyroid  disease    Urticaria    Uterine cancer ([place])     MEDICATIONS AT HOME: Prior to Admission medications   Medication Sig Start Date End Date Taking? Authorizing Provider  albuterol (PROVENTIL) (2.5 MG/3ML) 0.083% nebulizer solution Take 3 mLs (2.5 mg total) by nebulization every 6 (six) hours as needed for wheezing or shortness of breath. 11/09/18  Yes Charlott Rakes, MD  cholecalciferol (VITAMIN D) 1000 units tablet Take 1,000 Units by mouth at bedtime.   Yes [provider]  dupilumab (DUPIXENT) 300 MG/2ML prefilled syringe Inject 300 mg into the skin every 14 (fourteen) days. 12/13/19  Yes Kozlow, Donnamarie Poag, MD  EPINEPHrine 0.3 mg/0.3 mL IJ SOAJ injection Use as directed for life threatening allergic reactions 05/03/19  Yes Kozlow, Donnamarie Poag, MD  EQ ALLERGY RELIEF, CETIRIZINE, 10 MG tablet Take 1 tablet by mouth once daily 03/01/19  Yes Newlin, Enobong, MD  LORazepam (ATIVAN) 0.5 MG tablet Take 1 tablet (0.5 mg total) by mouth 2 (two) times daily as needed for anxiety. Use sparingly 08/23/20  Yes Reymond Maynez, Dionne Bucy, PA-C  Multiple Vitamin (MULTIVITAMIN) tablet Take 1 tablet by mouth daily.   Yes [provider]  OXcarbazepine (TRILEPTAL) 150 MG tablet Take 1 tablet by mouth twice daily 05/10/20  Yes Suzzanne Cloud, NP  Phentermine HCl [facility]) 8 MG TABS Take by mouth. 12/29/19  Yes [provider]  pregabalin (LYRICA) 75 MG capsule Take 1 capsule by mouth twice daily 08/07/20  Yes Marcial Pacas, MD  albuterol (VENTOLIN HFA) 108 (90 Base) MCG/ACT inhaler Inhale 1-2 puffs into the lungs every 6 (six) hours as needed for wheezing or shortness of breath. 08/23/20   Argentina Donovan, PA-C  atorvastatin (LIPITOR) 20 MG tablet TAKE 1 TABLET BY MOUTH ONCE DAILY. 08/23/20   Argentina Donovan, PA-C  budesonide-formoterol (SYMBICORT) 160-4.5 MCG/ACT inhaler Inhale 2 puffs into the lungs 2 (two) times daily. 08/23/20   Argentina Donovan, PA-C  cephALEXin (KEFLEX) 500 MG capsule Take 500 mg by  mouth 2 (two) times daily. Patient not taking: Reported on 08/23/2020 07/01/20   [provider]  clindamycin (CLEOCIN) 300 MG capsule Take 1 capsule (300 mg total) by mouth 2 (two) times daily. Patient not taking: Reported on 08/23/2020 06/29/20   Evelina Bucy, DPM  cyclobenzaprine (FLEXERIL) 5 MG tablet Take 1 tablet (5 mg total) by mouth 2 (two) times daily. Patient not taking: Reported on 08/23/2020 09/02/19   Ladell Pier, MD  diazepam (VALIUM) 5 MG tablet Take 1 tablet (5 mg total) by mouth every 6 (six) hours as needed for anxiety. Patient not taking: No sig reported 09/03/19   Charlett Blake, MD  doxycycline (MONODOX) 100 MG capsule Take 100 mg by mouth 2 (two) times daily. Patient not taking: Reported on 08/23/2020 07/08/20   [provider]  doxycycline (VIBRA-TABS) 100 MG tablet Take 100 mg by mouth 2 (two) times daily. Patient not taking: Reported on 08/23/2020 07/09/20   [provider]  DULoxetine (CYMBALTA) 60 MG capsule Take 1 capsule (60 mg total) by mouth daily. 08/23/20   Argentina Donovan, PA-C  DULoxetine (CYMBALTA) 60 MG capsule Take 1 capsule (60 mg total) by mouth daily. 08/23/20   Argentina Donovan, PA-C  fluticasone (FLONASE) 50 MCG/ACT nasal spray Place 2 sprays into both nostrils daily. 08/23/20   Argentina Donovan, PA-C  furosemide (LASIX) 20 MG tablet Take 1 tablet (20 mg total) by mouth daily as needed. Patient not taking: Reported on 08/23/2020 09/28/19   Charlott Rakes, MD  glipiZIDE (GLUCOTROL) 10 MG tablet Take 2 tablets (20 mg total) by mouth 2 (two) times daily before a meal. 08/23/20   Circe Chilton, Dionne Bucy, PA-C  Insulin Pen Needle (BD PEN NEEDLE NANO 2ND GEN) 32G X 4 MM MISC USE AS DIRECTED 08/23/20   Argentina Donovan, PA-C  levothyroxine (SYNTHROID) 137 MCG tablet Take 1 tablet (137 mcg total) by mouth daily before breakfast. 08/23/20   Kort Stettler, Dionne Bucy, PA-C  liraglutide (VICTOZA) 18 MG/3ML SOPN INJECT 1.8 MG SUBCUTANEOUSLY ONCE  DAILY. 08/23/20   Argentina Donovan, PA-C  losartan (COZAAR) 50 MG tablet Take 1.5 tablets (75 mg total) by mouth daily. 08/23/20   Argentina Donovan, PA-C  metFORMIN (GLUCOPHAGE) 500 MG tablet Take 2 tablets (1,000 mg total) by mouth 2 (two) times daily with a meal. 08/23/20   Nava Song, Dionne Bucy, PA-C  montelukast (SINGULAIR) 10 MG tablet Take 1 tablet (10 mg total) by mouth at bedtime. 08/23/20   Argentina Donovan, PA-C  mupirocin ointment (BACTROBAN) 2 % Apply 1 application topically 3 (three) times daily. Patient not taking: Reported on 08/23/2020 07/09/20   [provider]  omeprazole (PRILOSEC) 40 MG capsule Take 1 capsule (40 mg total) by mouth  daily. 08/23/20   Argentina Donovan, PA-C  triamcinolone cream (KENALOG) 0.1 % Apply 1 application topically 2 (two) times daily. Patient not taking: Reported on 08/23/2020 06/10/19   Charlott Rakes, MD     Objective:  EXAM:   Vitals:   08/23/20 1050  BP: (!) 141/74  Pulse: (!) 101  SpO2: 98%  Weight: (!) 436 lb 9.6 oz (198 kg)  Height: 5' 7.5" (1.715 m)    General appearance : A&OX3. NAD. Non-toxic-appearing;  morbidly obese HEENT: Atraumatic and Normocephalic.  PERRLA. EOM intact.   Chest/Lungs:  Breathing-non-labored, Good air entry bilaterally, breath sounds normal without rales, rhonchi, or wheezing  CVS: S1 S2 regular, no murmurs, gallops, rubs  Extremities: Bilateral Lower Ext with ted stockings, both legs are warm to touch with = pulse throughout Neurology:  CN II-XII grossly intact, Non focal.   Psych:  TP linear. J/I WNL. Normal speech. Appropriate eye contact and affect.  Skin:  No Rash  Data Review Lab Results  Component Value Date   HGBA1C 7.2 (A) 08/23/2020   HGBA1C 6.2 (H) 10/27/2019   HGBA1C 6.4 07/01/2019     Assessment & Plan   1. Type 2 diabetes mellitus with other neurologic complication, without long-term current use of insulin (HCC) Continue current regimen; check blood sugars fasting and bedtime and  record and see Lurena Joiner in 3 weeks.  Meds may need to be titrated but patient says blood sugars were erratic due to recent hospitalizations and infection and have been in low to mid 100s over the last few weeks - Glucose (CBG) - HgB A1c - atorvastatin (LIPITOR) 20 MG tablet; TAKE 1 TABLET BY MOUTH ONCE DAILY.  Dispense: 30 tablet; Refill: 5 - budesonide-formoterol (SYMBICORT) 160-4.5 MCG/ACT inhaler; Inhale 2 puffs into the lungs 2 (two) times daily.  Dispense: 1 each; Refill: 5 - Lipid panel - liraglutide (VICTOZA) 18 MG/3ML SOPN; INJECT 1.8 MG SUBCUTANEOUSLY ONCE DAILY.  Dispense: 9 mL; Refill: 3 - metFORMIN (GLUCOPHAGE) 500 MG tablet; Take 2 tablets (1,000 mg total) by mouth 2 (two) times daily with a meal.  Dispense: 360 tablet; Refill: 1 - glipiZIDE (GLUCOTROL) 10 MG tablet; Take 2 tablets (20 mg total) by mouth 2 (two) times daily before a meal.  Dispense: 360 tablet; Refill: 1 - Insulin Pen Needle (BD PEN NEEDLE NANO 2ND GEN) 32G X 4 MM MISC; USE AS DIRECTED  Dispense: 100 each; Refill: 5 - Comprehensive metabolic panel  2. Moderate persistent asthma without complication - albuterol (VENTOLIN HFA) 108 (90 Base) MCG/ACT inhaler; Inhale 1-2 puffs into the lungs every 6 (six) hours as needed for wheezing or shortness of breath.  Dispense: 54 g; Refill: 3 - budesonide-formoterol (SYMBICORT) 160-4.5 MCG/ACT inhaler; Inhale 2 puffs into the lungs 2 (two) times daily.  Dispense: 1 each; Refill: 5 - montelukast (SINGULAIR) 10 MG tablet; Take 1 tablet (10 mg total) by mouth at bedtime.  Dispense: 30 tablet; Refill: 4  3. Hypothyroidism, unspecified type - levothyroxine (SYNTHROID) 137 MCG tablet; Take 1 tablet (137 mcg total) by mouth daily before breakfast.  Dispense: 90 tablet; Refill: 1 - Thyroid Panel With TSH  4. Elevated alkaline phosphatase level - Gamma GT  5. Essential hypertension Not at goal-increase from 50 to 75mg  daily - losartan (COZAAR) 50 MG tablet; Take 1.5 tablets (75 mg total)  by mouth daily.  Dispense: 130 tablet; Refill: 0 - Comprehensive metabolic panel  6. Mild episode of recurrent major depressive disorder (HCC) - DULoxetine (CYMBALTA) 60 MG capsule; Take 1  capsule (60 mg total) by mouth daily.  Dispense: 30 capsule; Refill: 5 - DULoxetine (CYMBALTA) 60 MG capsule; Take 1 capsule (60 mg total) by mouth daily.  Dispense: 90 capsule; Refill: 0  7. Seasonal allergies stable - albuterol (VENTOLIN HFA) 108 (90 Base) MCG/ACT inhaler; Inhale 1-2 puffs into the lungs every 6 (six) hours as needed for wheezing or shortness of breath.  Dispense: 54 g; Refill: 3 - fluticasone (FLONASE) 50 MCG/ACT nasal spray; Place 2 sprays into both nostrils daily.  Dispense: 16 g; Refill: 6  I gave her resources on https://www.berger.biz/ and other overeating support.  Encouraged her to focus meals around lean proteins and non-starchy vegetables.  Avoid sugar and processed carbohydrates.      Patient have been counseled extensively about nutrition and exercise  Return in about 3 weeks (around 09/13/2020) for 3 weeks with Lurena Joiner;  3 months with PCP for chronic conditions.  The patient was given clear instructions to go to ER or return to medical center if symptoms don't improve, worsen or new problems develop. The patient verbalized understanding. The patient was told to call to get lab results if they haven't heard anything in the next week.     Freeman Caldron, PA-C [facility] [place], [place]   08/23/2020, 11:36 AM

## 2020-08-23 NOTE — Patient Instructions (Signed)
Check blood sugar fasting and at bedtime and bring numbers to next visit.    Focus diet around protein and non-starchy vegetables.

## 2020-08-24 ENCOUNTER — Ambulatory Visit: Payer: Medicaid Other | Admitting: Neurology

## 2020-08-24 ENCOUNTER — Other Ambulatory Visit: Payer: Self-pay | Admitting: Physician Assistant

## 2020-08-24 LAB — LIPID PANEL
Chol/HDL Ratio: 2.5 ratio (ref 0.0–4.4)
Cholesterol, Total: 103 mg/dL (ref 100–199)
HDL: 41 mg/dL (ref >39)
LDL Chol Calc (NIH): 43 mg/dL (ref 0–99)
Triglycerides: 100 mg/dL (ref 0–149)
VLDL Cholesterol Cal: 19 mg/dL (ref 5–40)

## 2020-08-24 LAB — COMPREHENSIVE METABOLIC PANEL
ALT: 25 IU/L (ref 0–32)
AST: 48 IU/L — ABNORMAL HIGH (ref 0–40)
Albumin/Globulin Ratio: 1.1 — ABNORMAL LOW (ref 1.2–2.2)
Albumin: 3.4 g/dL — ABNORMAL LOW (ref 3.8–4.9)
Alkaline Phosphatase: 107 IU/L (ref 44–121)
BUN/Creatinine Ratio: 9 (ref 9–23)
BUN: 7 mg/dL (ref 6–24)
Bilirubin Total: 1.9 mg/dL — ABNORMAL HIGH (ref 0.0–1.2)
CO2: 20 mmol/L (ref 20–29)
Calcium: 8.6 mg/dL — ABNORMAL LOW (ref 8.7–10.2)
Chloride: 104 mmol/L (ref 96–106)
Creatinine, Ser: 0.77 mg/dL (ref 0.57–1.00)
Globulin, Total: 3.1 g/dL (ref 1.5–4.5)
Glucose: 129 mg/dL — ABNORMAL HIGH (ref 65–99)
Potassium: 3.7 mmol/L (ref 3.5–5.2)
Sodium: 140 mmol/L (ref 134–144)
Total Protein: 6.5 g/dL (ref 6.0–8.5)
eGFR: 93 mL/min/{1.73_m2} (ref >59)

## 2020-08-24 LAB — THYROID PANEL WITH TSH
Free Thyroxine Index: 1.6 (ref 1.2–4.9)
T3 Uptake Ratio: 27 % (ref 24–39)
T4, Total: 6.1 ug/dL (ref 4.5–12.0)
TSH: 8.1 u[IU]/mL — ABNORMAL HIGH (ref 0.450–4.500)

## 2020-08-24 LAB — GAMMA GT: GGT: 40 IU/L (ref 0–60)

## 2020-08-24 MED ORDER — LEVOTHYROXINE SODIUM 150 MCG PO TABS: 150.0000 ug | ORAL_TABLET | Freq: Every day | ORAL | 3 refills | Status: DC

## 2020-08-25 ENCOUNTER — Telehealth: Payer: Self-pay | Admitting: *Deleted

## 2020-08-25 NOTE — Telephone Encounter (Signed)
Left a message for the patient to call the office back. Need to move her appt to the morning on 8/4

## 2020-09-01 ENCOUNTER — Ambulatory Visit: Payer: Medicaid Other | Admitting: Oncology

## 2020-09-01 ENCOUNTER — Other Ambulatory Visit: Payer: Medicaid Other

## 2020-09-04 ENCOUNTER — Ambulatory Visit (HOSPITAL_COMMUNITY): Payer: Medicaid Other

## 2020-09-05 NOTE — Progress Notes (Signed)
[facility]  [address] [place],  [place]  [address] [phone]  Clinic Day:  09/19/2020  Referring physician: Charlott Rakes, MD  This document serves as a record of services personally performed by Dequincy Macarthur Critchley, MD. It was created on their behalf by Curry,Lauren E, a trained medical scribe. The creation of this record is based on the scribe's personal observations and the provider's statements to them.  HISTORY OF PRESENT ILLNESS:  The patient is a 50 y.o. female  with anemia and thrombocytopenia. She comes in today to go over recent labs to determine the etiology behind her low counts.  Of note, prior scans have shown splenomegaly.  Since her last visit, the patient has been doing okay.  However, recent CT scans ordered by gynecology-oncology showed findings highly worrisome for omental metastasis.  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  Adjuvant therapy was not considered for her early stage uterine cancer. All scans up until recently had shown no evidence of disease recurrence.      PHYSICAL EXAM:  Blood pressure 138/74, pulse 93, temperature 99 F (37.2 C), resp. rate 16, height 5' 7.5" (1.715 m), weight (!) 422 lb 6.4 oz (191.6 kg), SpO2 96 %. Wt Readings from Last 3 Encounters:  09/18/20 (!) 422 lb 6.4 oz (191.6 kg)  09/12/20 (!) 427 lb 2 oz (193.7 kg)  08/23/20 (!) 436 lb 9.6 oz (198 kg)   Body mass index is 65.18 kg/m. Performance status (ECOG): 1 - Symptomatic but completely ambulatory Physical Exam Constitutional:      Appearance: Normal appearance. She is not ill-appearing.  HENT:     Mouth/Throat:     Mouth: Mucous membranes are moist.     Pharynx: Oropharynx is clear. No oropharyngeal exudate or posterior oropharyngeal erythema.  Cardiovascular:     Rate and Rhythm: Normal rate and regular rhythm.     Heart sounds: No murmur heard.   No friction rub. No gallop.   Pulmonary:     Effort: Pulmonary effort is normal. No respiratory distress.     Breath sounds: Normal breath sounds. No wheezing, rhonchi or rales.  Chest:  Breasts:    Right: No axillary adenopathy or supraclavicular adenopathy.     Left: No axillary adenopathy or supraclavicular adenopathy.  Abdominal:     General: Bowel sounds are normal. There is no distension.     Palpations: Abdomen is soft. There is no mass.     Tenderness: There is no abdominal tenderness.  Musculoskeletal:        General: No swelling.     Right lower leg: No edema.     Left lower leg: No edema.  Lymphadenopathy:     Cervical: No cervical adenopathy.     Upper Body:     Right upper body: No supraclavicular or axillary adenopathy.     Left upper body: No supraclavicular or axillary adenopathy.     Lower Body: No right inguinal adenopathy. No left inguinal adenopathy.  Skin:    General: Skin is warm.     Coloration: Skin is not jaundiced.     Findings: No lesion or rash.  Neurological:     General: No focal deficit present.     Mental Status: She is alert and oriented to person, place, and time. Mental status is at baseline.     Cranial Nerves: Cranial nerves are intact.  Psychiatric:  Mood and Affect: Mood normal.        Behavior: Behavior normal.        Thought Content: Thought content normal.    LABS:   CBC Latest Ref Rng & Units 09/18/2020 08/11/2020 12/08/2018  WBC - 2.9 2.7 5.7  Hemoglobin 12.0 - 16.0 10.4(A) 10.7(A) 15.0  Hematocrit 36 - 46 32(A) 33(A) 43.4  Platelets 150 - 399 104(A) 83(A) 84(LL)   CMP Latest Ref Rng & Units 08/23/2020 08/11/2020 10/27/2019  Glucose 65 - 99 mg/dL 129(H) - 110(H)  BUN 6 - 24 mg/dL '7 7 7  '$ Creatinine 0.57 - 1.00 mg/dL 0.77 0.5 0.84  Sodium 134 - 144 mmol/L 140 139 141  Potassium 3.5 - 5.2 mmol/L 3.7 3.9 4.1  Chloride 96 - 106 mmol/L 104 108 104  CO2 20 - 29 mmol/L '20 20 22  '$ Calcium 8.7 - 10.2 mg/dL 8.6(L) 8.9 9.1  Total Protein 6.0 - 8.5 g/dL 6.5 - -   Total Bilirubin 0.0 - 1.2 mg/dL 1.9(H) - -  Alkaline Phos 44 - 121 IU/L 107 162(A) -  AST 0 - 40 IU/L 48(H) 61(A) -  ALT 0 - 32 IU/L 25 31 -   Component Ref Range & Units 3 wk ago   Iron 28 - 170 ug/dL 63   TIBC 250 - 450 ug/dL 460 High    Saturation Ratios 10.4 - 31.8 % 14   UIBC ug/dL 397    Ferritin 11 - 307 ng/mL 12    Vitamin B-12 180 - 914 pg/mL 958 High     Folate >5.9 ng/mL 30.6     ASSESSMENT & PLAN:  A 51 y.o. female with anemia and thrombocytopenia.  Her recent iron parameters appear to be consistent with iron deficiency anemia.  Based upon this, I will arrange for her to receive IV Feraheme over these next few weeks.  Although her platelets are low, they are above 100.  For now, her mild thrombocytopenia can be followed conservatively. 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  She is scheduled to undergo an omental biopsy later this week, whose results will guide what type of treatment she ultimately may need.  If her biopsy is positive for cancer, she understands she is likely dealing with incurable disease of some sort for which palliative therapy would be needed for prolonged disease control.  I will see her back next week to go over her biopsy results and their implications.  The patient understands all the plans discussed today and is in agreement with them.   I, Rita Ohara, am acting as scribe for Marice Potter, MD    I have reviewed this report as typed by the medical scribe, and it is complete and accurate.  Dequincy Macarthur Critchley, MD

## 2020-09-06 ENCOUNTER — Ambulatory Visit: Payer: Medicaid Other | Admitting: Gynecologic Oncology

## 2020-09-07 ENCOUNTER — Other Ambulatory Visit: Payer: Self-pay

## 2020-09-07 ENCOUNTER — Ambulatory Visit (HOSPITAL_COMMUNITY)
Admission: RE | Admit: 2020-09-07 | Discharge: 2020-09-07 | Disposition: A | Discharge status: Home / Self Care | Payer: Medicaid Other | Source: Ambulatory Visit | Attending: Gynecologic Oncology | Admitting: Gynecologic Oncology

## 2020-09-07 DIAGNOSIS — R599 Enlarged lymph nodes, unspecified: Secondary | ICD-10-CM | POA: Insufficient documentation

## 2020-09-07 MED ORDER — SODIUM CHLORIDE (PF) 0.9 % IJ SOLN
INTRAMUSCULAR | Status: AC
  Filled 2020-09-07: qty 50

## 2020-09-07 MED ORDER — IOHEXOL 350 MG/ML SOLN
100.0000 mL | Freq: Once | INTRAVENOUS | Status: AC | PRN
  Administered 2020-09-07: 80 mL via INTRAVENOUS

## 2020-09-08 ENCOUNTER — Encounter: Payer: Medicaid Other | Admitting: Physical Medicine & Rehabilitation

## 2020-09-08 ENCOUNTER — Telehealth: Payer: Self-pay | Admitting: Oncology

## 2020-09-08 ENCOUNTER — Telehealth: Payer: Self-pay

## 2020-09-08 NOTE — Telephone Encounter (Signed)
Received phone call from [facility] requesting to reschedule her appointment on 8/2. She has transportation issues. Her appointment has been changed to 8/31 at 2:15. Patient is in agreement of date and time.

## 2020-09-08 NOTE — Telephone Encounter (Signed)
Janet [name] called and said she read her CT results on MyChart and wants to move her apt back with Dr. Denman George to 09/12/20.  Moved apt back to 11:45.

## 2020-09-10 ENCOUNTER — Encounter: Payer: Self-pay | Admitting: Family Medicine

## 2020-09-10 ENCOUNTER — Other Ambulatory Visit: Payer: Self-pay | Admitting: Family Medicine

## 2020-09-10 MED ORDER — CEPHALEXIN 500 MG PO CAPS: 500.0000 mg | ORAL_CAPSULE | Freq: Two times a day (BID) | ORAL | 0 refills | Status: DC

## 2020-09-12 ENCOUNTER — Encounter: Payer: Self-pay | Admitting: Oncology

## 2020-09-12 ENCOUNTER — Encounter: Payer: Self-pay | Admitting: Gynecologic Oncology

## 2020-09-12 ENCOUNTER — Inpatient Hospital Stay: Payer: Medicaid Other | Attending: Hematology and Oncology | Admitting: Gynecologic Oncology

## 2020-09-12 ENCOUNTER — Telehealth: Payer: Self-pay | Admitting: Oncology

## 2020-09-12 ENCOUNTER — Inpatient Hospital Stay: Payer: Medicaid Other | Admitting: Gynecologic Oncology

## 2020-09-12 ENCOUNTER — Other Ambulatory Visit: Payer: Self-pay

## 2020-09-12 VITALS — BP 160/59 | HR 95 | Temp 98.2°F | Resp 18 | Ht 68.0 in | Wt >= 6400 oz

## 2020-09-12 DIAGNOSIS — Z79899 Other long term (current) drug therapy: Secondary | ICD-10-CM | POA: Insufficient documentation

## 2020-09-12 DIAGNOSIS — Z90710 Acquired absence of both cervix and uterus: Secondary | ICD-10-CM | POA: Diagnosis not present

## 2020-09-12 DIAGNOSIS — Z6841 Body Mass Index (BMI) 40.0 and over, adult: Secondary | ICD-10-CM | POA: Insufficient documentation

## 2020-09-12 DIAGNOSIS — E119 Type 2 diabetes mellitus without complications: Secondary | ICD-10-CM | POA: Diagnosis not present

## 2020-09-12 DIAGNOSIS — I10 Essential (primary) hypertension: Secondary | ICD-10-CM | POA: Insufficient documentation

## 2020-09-12 DIAGNOSIS — E039 Hypothyroidism, unspecified: Secondary | ICD-10-CM | POA: Insufficient documentation

## 2020-09-12 DIAGNOSIS — Z8542 Personal history of malignant neoplasm of other parts of uterus: Secondary | ICD-10-CM | POA: Diagnosis present

## 2020-09-12 DIAGNOSIS — Z90722 Acquired absence of ovaries, bilateral: Secondary | ICD-10-CM | POA: Insufficient documentation

## 2020-09-12 DIAGNOSIS — K7460 Unspecified cirrhosis of liver: Secondary | ICD-10-CM | POA: Insufficient documentation

## 2020-09-12 DIAGNOSIS — Z794 Long term (current) use of insulin: Secondary | ICD-10-CM | POA: Insufficient documentation

## 2020-09-12 DIAGNOSIS — K649 Unspecified hemorrhoids: Secondary | ICD-10-CM | POA: Diagnosis not present

## 2020-09-12 DIAGNOSIS — K766 Portal hypertension: Secondary | ICD-10-CM | POA: Insufficient documentation

## 2020-09-12 DIAGNOSIS — C800 Disseminated malignant neoplasm, unspecified: Secondary | ICD-10-CM

## 2020-09-12 DIAGNOSIS — Z7984 Long term (current) use of oral hypoglycemic drugs: Secondary | ICD-10-CM | POA: Diagnosis not present

## 2020-09-12 DIAGNOSIS — C786 Secondary malignant neoplasm of retroperitoneum and peritoneum: Secondary | ICD-10-CM | POA: Insufficient documentation

## 2020-09-12 DIAGNOSIS — C541 Malignant neoplasm of endometrium: Secondary | ICD-10-CM

## 2020-09-12 NOTE — Patient Instructions (Signed)
The CT scan is showing nodules in the fatty tissue surrounding the intestines. This appears most consistent with recurrence of your endometrial cancer.  Dr Denman George has scheduled a biopsy to confirm this diagnosis. If this confirms that the cancer has regrown, she will recommend that Dr Bobby Rumpf administer chemotherapy until there is no evidence of the disease remaining.

## 2020-09-12 NOTE — Telephone Encounter (Signed)
Left a message for Dr. Raquel Sarna nurse advising that patient has an apparent recurrence of endometrial cancer on CT scan from 09/08/2020.  She saw Dr. Denman George today and we wanted to make sure he saw her office note.  Requested a return call to confirm.

## 2020-09-12 NOTE — Progress Notes (Signed)
Requested ER/PR, PD-L1 and MMR/MSI testing on accession [phone] with [facility] via email.

## 2020-09-12 NOTE — Progress Notes (Signed)
Gynecologic Oncology Follow-up Note  Patient's location: home Provider's location: Janet [name]  Chief Complaint:  Chief Complaint  Patient presents with   Endometrial cancer Janet [name])    Assessment/Plan:  [name] is a 50 y.o.  with a history of stage IA grade 2 endometrial cancer s/p robotic hysterectomy, BSO on 07/09/16. Low risk features. MMR pending.  She has cirrhosis, extreme morbid obesity (BMI 65) and stable, enlarged PET negative nodes. She has apparent recurrence on CT scan from 09/08/20. This is carcinomatosis.  1/ carcinomatosis/hx of endometrial cancer. I explained to Ms Janet [name] that the scans appear consistent with recurrent endometrial cancer.  We need to evaluate/confirm with a CT scan guided biopsy. Testing such as MMR/PDL1/ER/PR should be run on this biopsy to assist with targeted therapy. We will recommend salvage chemotherapy (likely paclitaxel and carboplatin) based on the results. She will see Janet [name] in [place] next week to discuss further. I explained the poor prognosis of recurrent endometrial cancer. She has a 45 year old son. I recommended to her that she begin making plans with her family for custody in case treatment is not successful.   2/ vaginal bleeding. No evidence for recurrence on vaginal exam. I suspect this is actual anal bleeding from a palpable hemorrhoid.    HPI: [name] is a 50 y.o.  G2P1 with history of irregular menses.  Janet [name] presented To Janet. Hulan [name] in  with a report of bleeding daily for almost a month. Janet [name] was unable to tolerate an office examination and thus she underwent hysteroscopy D&C on 06/11/2016.  Pathology was notable for complex atypical hyperplasia with a few small foci suspicious for early/microscopic adenocarcinoma.   On 07/09/16 she underwent robotic assisted total hysterectomy, BSO for a uterus >250 (336)gm with minilaparotomy for specimen delivery.  Final pathology  revealed a 2.4cm grade 2 endometrioid tumor with no myometrial invasion or LVSI. Lymphadenectomy was not performed due to extreme abdominal adiposity (BMI 56) and preoperative preinvasive diagnosis. She was determined to have low risk features in her pathology and therefore no adjuvant therapy was recommended in accordance with NCCN guidelines.  On post-op imaging (CT) on 11/21/16 (ordered due to inability to stage because of obesity) revealed an interval increase in size of a now mildly enlarged retroperitoneal lymph node in the aortocaval space. Close attention in this region on follow-up was recommended as metastatic disease was a concern. Cirrhotic changes in the liver with evidence of portal venous hypertension (recanalization of paraumbilical vein and Splenomegaly) were also seen.   Follow-up PET/CT was ordered and performed on 12/04/16 which showed aortocaval adenopathy measuring 1.5cm. Non-PET avid. The images were reviewed at tumor board and felt to not be likely metastatic disease therefore observation and repeat imaging was recommended.  Repeat CT scan on 04/10/17 showed stable PA nodes (1.6cm largest).  Surveillance CT abd/pelvis in November, 2019 (ordered due to history of prior lymphadenopathy) showed nonspecific mild retroperitoneal adenopathy which is stable, the continued stability of these nodes along with the absence of hypermetabolism on PET in 2018 suggest reactive adenopathy, although continued CT surveillance was recommended.  No new potential findings of metastatic disease were seen in the abdomen or pelvis.  Hepatic cirrhosis was seen.  Stable moderate splenomegaly and lower paraesophageal periumbilical and perisplenic varices was seen.  A scheduled surveillance CT scan was performed on December 24, 2018 and this revealed stable prominent retroperitoneal lymph nodes in the periaortic region measuring up to 1.5 cm, unchanged compared to prior  examinations.  There were multiple  large varices in the vicinity which did not represent represent lymphadenopathy.  There was stigmata of cirrhosis and splenomegaly present.  There was no evidence of recurrence or metastatic disease.  Interval Hx:  She was lost to follow-up for a period of approximately 2 years.  She was seen by Janet. Lavera Guise on 08/11/2020 for reasons of anemia and thrombocytopenia.  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  She began experiencing what she felt was vaginal bleeding in July 2022.  This prompted her to reestablish care with gynecologic oncology.  After calling for a return visit, it was noted she had not had CT imaging for 2 years to monitor lymphadenopathy and therefore CT scan of the abdomen and pelvis with contrast was ordered and performed on 09/08/2020.  This revealed evidence of recurrence with carcinomatosis, new omental and peritoneal nodularity with an index nodule in the right lateral abdominal omentum measuring 1.6 cm x 3.1 cm.  There were also consistent stigmata of cirrhosis and portal hypertension.  Review of Systems:  Constitutional  Feels well,   Cardiovascular  No chest pain, shortness of breath, or edema  Pulmonary  No cough or wheeze.  Gastro Intestinal  + crampy abdominal pains Genito Urinary  + possible vaginal bleeding Musculo Skeletal  No myalgia, arthralgia, joint swelling or pain  Neurologic  No weakness, numbness, change in gait,  Psychology  No depression, anxiety from diagnoses and social situation  Current Meds:  Outpatient Encounter Medications as of 09/12/2020  Medication Sig   albuterol (PROVENTIL) (2.5 MG/3ML) 0.083% nebulizer solution Take 3 mLs (2.5 mg total) by nebulization every 6 (six) hours as needed for wheezing or shortness of breath.   albuterol (VENTOLIN HFA) 108 (90 Base) MCG/ACT inhaler Inhale 1-2 puffs into the lungs every 6 (six) hours as needed for wheezing or shortness of breath.   atorvastatin (LIPITOR)  20 MG tablet TAKE 1 TABLET BY MOUTH ONCE DAILY.   budesonide-formoterol (SYMBICORT) 160-4.5 MCG/ACT inhaler Inhale 2 puffs into the lungs 2 (two) times daily.   cephALEXin (KEFLEX) 500 MG capsule Take 1 capsule (500 mg total) by mouth 2 (two) times daily.   cholecalciferol (VITAMIN D) 1000 units tablet Take 1,000 Units by mouth at bedtime.   cyclobenzaprine (FLEXERIL) 5 MG tablet Take 1 tablet (5 mg total) by mouth 2 (two) times daily. (Patient not taking: Reported on 08/23/2020)   diazepam (VALIUM) 5 MG tablet Take 1 tablet (5 mg total) by mouth every 6 (six) hours as needed for anxiety. (Patient not taking: No sig reported)   doxycycline (VIBRA-TABS) 100 MG tablet Take 100 mg by mouth 2 (two) times daily. (Patient not taking: Reported on 08/23/2020)   DULoxetine (CYMBALTA) 60 MG capsule Take 1 capsule (60 mg total) by mouth daily.   dupilumab (DUPIXENT) 300 MG/2ML prefilled syringe Inject 300 mg into the skin every 14 (fourteen) days.   EPINEPHrine 0.3 mg/0.3 mL IJ SOAJ injection Use as directed for life threatening allergic reactions   EQ ALLERGY RELIEF, CETIRIZINE, 10 MG tablet Take 1 tablet by mouth once daily   fluticasone (FLONASE) 50 MCG/ACT nasal spray Place 2 sprays into both nostrils daily.   furosemide (LASIX) 20 MG tablet Take 1 tablet (20 mg total) by mouth daily as needed. (Patient not taking: Reported on 08/23/2020)   glipiZIDE (GLUCOTROL) 10 MG tablet Take 2 tablets (20 mg total) by mouth 2 (two) times daily before a meal.   Insulin Pen  Needle (BD PEN NEEDLE NANO 2ND GEN) 32G X 4 MM MISC USE AS DIRECTED   levothyroxine (SYNTHROID) 150 MCG tablet Take 1 tablet (150 mcg total) by mouth daily.   liraglutide (VICTOZA) 18 MG/3ML SOPN INJECT 1.8 MG SUBCUTANEOUSLY ONCE DAILY.   LORazepam (ATIVAN) 0.5 MG tablet Take 1 tablet (0.5 mg total) by mouth 2 (two) times daily as needed for anxiety. Use sparingly   losartan (COZAAR) 50 MG tablet Take 1.5 tablets (75 mg total) by mouth daily.    metFORMIN (GLUCOPHAGE) 500 MG tablet Take 2 tablets (1,000 mg total) by mouth 2 (two) times daily with a meal.   montelukast (SINGULAIR) 10 MG tablet Take 1 tablet (10 mg total) by mouth at bedtime.   Multiple Vitamin (MULTIVITAMIN) tablet Take 1 tablet by mouth daily.   mupirocin ointment (BACTROBAN) 2 % Apply 1 application topically 3 (three) times daily. (Patient not taking: Reported on 08/23/2020)   omeprazole (PRILOSEC) 40 MG capsule Take 1 capsule (40 mg total) by mouth daily.   OXcarbazepine (TRILEPTAL) 150 MG tablet Take 1 tablet by mouth twice daily   Phentermine HCl (LOMAIRA) 8 MG TABS Take by mouth.   pregabalin (LYRICA) 75 MG capsule Take 1 capsule by mouth twice daily   triamcinolone cream (KENALOG) 0.1 % Apply 1 application topically 2 (two) times daily. (Patient not taking: Reported on 08/23/2020)   No facility-administered encounter medications on file as of 09/12/2020.    Allergy:  Allergies  Allergen Reactions   Ciprofloxacin Shortness Of Breath   Shrimp [Shellfish Allergy] Other (See Comments)    positive allergy test    Social Hx:   Social History   Socioeconomic History   Marital status: Widowed    Spouse name: Not on file   Number of children: 1   Years of education: college   Highest education level: Not on file  Occupational History   Occupation: Unemployed   Occupation: DISABLED  Tobacco Use   Smoking status: Never   Smokeless tobacco: Never  Vaping Use   Vaping Use: Never used  Substance and Sexual Activity   Alcohol use: No   Drug use: No   Sexual activity: Not Currently    Birth control/protection: None  Other Topics Concern   Not on file  Social History Narrative   Lives at home with her son.   Right-handed.   2 cups caffeine per day.   Social Determinants of Health   Financial Resource Strain: Not on file  Food Insecurity: Not on file  Transportation Needs: Not on file  Physical Activity: Not on file  Stress: Not on file  Social  Connections: Not on file  Intimate Partner Violence: Not on file   Husband was diagnosed with leukemia in January 2018 with a life expectancy of approximately 6 months. At this time he is receiving supportive care and regularly is managed with blood and platelet transfusions.  Regular home nursing visits are in place.   Jeanean Hollett denies history of sexual or physical abuse.  Past Surgical Hx:  Past Surgical History:  Procedure Laterality Date   ABDOMINAL HYSTERECTOMY     CARPAL TUNNEL RELEASE     CESAREAN SECTION     CESAREAN SECTION N/A    Phreesia 03/14/2020   CHOLECYSTECTOMY     cyst removal     uterus   DILATION AND CURETTAGE OF UTERUS     still birth   DILATION AND CURETTAGE OF UTERUS N/A 06/11/2016   Procedure: DILATATION AND CURETTAGE;  Surgeon: Emily Filbert, MD;  Location: [facility];  Service: Gynecology;  Laterality: N/A;   ROBOTIC ASSISTED TOTAL HYSTERECTOMY WITH BILATERAL SALPINGO OOPHERECTOMY Bilateral 07/09/2016   Procedure: XI ROBOTIC ASSISTED TOTAL HYSTERECTOMY WITH BILATERAL SALPINGO OOPHORECTOMY FOR UTERUS GREATER THAN 250 GRAMS, LYSIS OF ADHESIONS;  Surgeon: Everitt Amber, MD;  Location: [facility];  Service: Gynecology;  Laterality: Bilateral;   SINOSCOPY     SINUS EXPLORATION     WISDOM TOOTH EXTRACTION      Past Medical Hx:  Past Medical History:  Diagnosis Date   Anemia    Angio-edema    Anxiety    Arthritis    Asthma    Depression    Diabetes mellitus without complication (HCC)    Hypertension    Hypothyroidism    Pre-diabetes    Right foot drop    Sleep apnea    Thyroid disease    Urticaria    Uterine cancer [facility])     Past Gynecological History:  Gravida 2 para 1 menarche at 2 with irregular menses since last Pap 06/11/2016 within normal limits no history of abnormal Pap test reports history of OCP use in her 58s  Family Hx:  Family History  Problem Relation Age of Onset   Diabetes Mother    Heart attack Mother    Allergic rhinitis Mother     Suicidality Father    Hyperlipidemia Father     Vitals:  Blood pressure (!) 160/59, pulse 95, temperature 98.2 F (36.8 C), temperature source Oral, resp. rate 18, height '5\' 8"'  (1.727 m), weight (!) 427 lb 2 oz (193.7 kg), SpO2 96 %. Body mass index is 64.94 kg/m. Wt Readings from Last 3 Encounters:  09/12/20 (!) 427 lb 2 oz (193.7 kg)  08/23/20 (!) 436 lb 9.6 oz (198 kg)  08/11/20 (!) 429 lb 1.6 oz (194.6 kg)    Physical Exam: WD in NAD Neck  Supple NROM, without any enlargements.  Lymph Node Survey No cervical supraclavicular or inguinal adenopathy Cardiovascular  Well perfused peripheries.  Lungs  No increased WOB Skin  No rash/lesions/breakdown  Psychiatry  Alert and oriented to person, place, and time  Abdomen  Normoactive bowel sounds, abdomen soft, non-tender and very obese with a large pannus, without evidence of hernia. No palpable nodularity. Back No CVA tenderness Genito Urinary  Vulva/vagina: Normal external female genitalia.  No lesions. No discharge or bleeding.  Bladder/urethra:  No lesions or masses, well supported bladder  Vagina: normal, no palpable or visible lesions. Very long vagina, difficult to see apex.   Uterus, cervix and ovaries surgically absent Small, mobile, no parametrial involvement or nodularity.  Adnexa: no palpable masses Rectal  Good tone, no masses no cul de sac nodularity. + hemorrhoid present.  Extremities  No bilateral cyanosis, clubbing or edema.    Thereasa Solo, MD 09/12/2020, 12:58 PM

## 2020-09-12 NOTE — Telephone Encounter (Signed)
I LVM on identified voicemail of Santiago Glad. I notified her I received her call regarding probable recurrence of endometrial cancer, & that Dr Denman George has order CT biopsy. Dr Denman George recommends salvage chemo if biopsy is positive. Dr Bobby Rumpf is off this week, but he is scheduled to see pt on 09/18/20. I will notify him on his return.

## 2020-09-13 ENCOUNTER — Telehealth: Payer: Self-pay

## 2020-09-13 NOTE — Telephone Encounter (Signed)
Spoke with Janet [name] this afternoon, instructed her to follow up with her PCP regarding continuing antibiotic for UTI and for prescription for sleep. Patient verbalized understanding. Patient provided with contact information for [facility] [phone]). Instructed to call with any questions or concerns.

## 2020-09-14 ENCOUNTER — Ambulatory Visit: Payer: Medicaid Other | Admitting: Gynecologic Oncology

## 2020-09-14 ENCOUNTER — Encounter (HOSPITAL_COMMUNITY): Payer: Self-pay | Admitting: Radiology

## 2020-09-14 ENCOUNTER — Ambulatory Visit: Payer: Medicaid Other | Admitting: Physician Assistant

## 2020-09-14 NOTE — Progress Notes (Signed)
Patient Name  Janet [name], Zehrung Legal Sex  Female DOB  1970/11/25 SSN  [id number] Address  [place] [address] Phone  [phone] (Home) *Preferred*  [phone] (Mobile)     RE: CT Biopsy Received: Today Markus Daft, MD  Garth Bigness D CT guided omental biopsy.     Note to IRs: May want to have [place] in CT to differentiate tumor vs possible varices.   [place]         Previous Messages    ----- Message -----  From: Garth Bigness D  Sent: 09/12/2020   4:43 PM EDT  To: Ir Procedure Requests  Subject: CT Biopsy                                       Procedure:  CT Biopsy   Reason:  Endometrial cancer, Carcinomatosis, carcinomatosis/omental nodules on CT scan with history of endometrial cancer, please biopsy   History:  CT in computer   Provider:  Everitt Amber   Provider Contact:  [phone]

## 2020-09-17 ENCOUNTER — Other Ambulatory Visit: Payer: Self-pay | Admitting: Oncology

## 2020-09-17 DIAGNOSIS — D649 Anemia, unspecified: Secondary | ICD-10-CM

## 2020-09-18 ENCOUNTER — Other Ambulatory Visit: Payer: Self-pay | Admitting: Hematology and Oncology

## 2020-09-18 ENCOUNTER — Inpatient Hospital Stay (INDEPENDENT_AMBULATORY_CARE_PROVIDER_SITE_OTHER): Payer: Medicaid Other | Admitting: Oncology

## 2020-09-18 ENCOUNTER — Inpatient Hospital Stay: Payer: Medicaid Other

## 2020-09-18 ENCOUNTER — Other Ambulatory Visit: Payer: Self-pay | Admitting: Oncology

## 2020-09-18 ENCOUNTER — Telehealth: Payer: Self-pay

## 2020-09-18 VITALS — BP 138/74 | HR 93 | Temp 99.0°F | Resp 16 | Ht 67.5 in | Wt >= 6400 oz

## 2020-09-18 DIAGNOSIS — C541 Malignant neoplasm of endometrium: Secondary | ICD-10-CM

## 2020-09-18 DIAGNOSIS — D649 Anemia, unspecified: Secondary | ICD-10-CM

## 2020-09-18 DIAGNOSIS — C549 Malignant neoplasm of corpus uteri, unspecified: Secondary | ICD-10-CM

## 2020-09-18 LAB — CBC AND DIFFERENTIAL
HCT: 32 — AB (ref 36–46)
Hemoglobin: 10.4 — AB (ref 12.0–16.0)
Neutrophils Absolute: 1.86
Platelets: 104 — AB (ref 150–399)
WBC: 2.9

## 2020-09-18 LAB — CBC: RBC: 4.04 (ref 3.87–5.11)

## 2020-09-18 NOTE — Progress Notes (Signed)
Gynecologic Oncology Multi-Disciplinary Disposition Conference Note  Date of the Conference: 09/18/2020  Patient Name: Janet [name]  Primary GYN Oncologist: Dr. Denman George  Stage/Disposition:  Stage IA, grade 2 recurrent endometrial cancer. Disposition is 1; for MMR/MSI testing on original surgery. 2. CT biopsy to confirm recurrence. 3. Salvage chemotherapy with carboplatin/Taxol.  Also referral for genetic counseling.  This Multidisciplinary conference took place involving physicians from [place], [facility], Radiation Oncology, Pathology, Radiology along with the Gynecologic Oncology Nurse Practitioner and RN.  Comprehensive assessment of the patient's malignancy, staging, need for surgery, chemotherapy, radiation therapy, and need for further testing were reviewed. Supportive measures, both inpatient and following discharge were also discussed. The recommended plan of care is documented. Greater than 35 minutes were spent correlating and coordinating this patient's care.

## 2020-09-19 ENCOUNTER — Ambulatory Visit: Payer: Medicaid Other | Admitting: Pharmacist

## 2020-09-19 ENCOUNTER — Telehealth: Payer: Self-pay | Admitting: Neurology

## 2020-09-19 ENCOUNTER — Ambulatory Visit: Payer: Medicaid Other | Admitting: Neurology

## 2020-09-19 ENCOUNTER — Encounter: Payer: Self-pay | Admitting: Oncology

## 2020-09-19 NOTE — Telephone Encounter (Signed)
Pt cancelled due to not feeling well

## 2020-09-20 ENCOUNTER — Telehealth: Payer: Self-pay | Admitting: Oncology

## 2020-09-20 ENCOUNTER — Other Ambulatory Visit: Payer: Self-pay | Admitting: Radiology

## 2020-09-20 NOTE — Telephone Encounter (Signed)
Per Staff Request - Patient scheduled for Genetic Counseling on 8/16 at 11:00 am  Patient was aware of why she was referred - She's requesting someone to call her w/explanation

## 2020-09-20 NOTE — Telephone Encounter (Signed)
Dr Bobby Rumpf, previous message should have went to Janet John, NP - Please disregard  Patient scheduled for Genetic Counseling on 8/16

## 2020-09-21 ENCOUNTER — Encounter (HOSPITAL_COMMUNITY): Payer: Self-pay

## 2020-09-21 ENCOUNTER — Ambulatory Visit (HOSPITAL_COMMUNITY)
Admission: RE | Admit: 2020-09-21 | Discharge: 2020-09-21 | Disposition: A | Discharge status: Home / Self Care | Payer: Medicaid Other | Source: Ambulatory Visit | Attending: Gynecologic Oncology | Admitting: Gynecologic Oncology

## 2020-09-21 ENCOUNTER — Other Ambulatory Visit: Payer: Self-pay

## 2020-09-21 DIAGNOSIS — C541 Malignant neoplasm of endometrium: Secondary | ICD-10-CM | POA: Insufficient documentation

## 2020-09-21 DIAGNOSIS — C800 Disseminated malignant neoplasm, unspecified: Secondary | ICD-10-CM | POA: Diagnosis present

## 2020-09-21 LAB — PROTIME-INR
INR: 1.5 — ABNORMAL HIGH (ref 0.8–1.2)
Prothrombin Time: 17.9 seconds — ABNORMAL HIGH (ref 11.4–15.2)

## 2020-09-21 LAB — GLUCOSE, CAPILLARY
Glucose-Capillary: 131 mg/dL — ABNORMAL HIGH (ref 70–99)
Glucose-Capillary: 121 mg/dL — ABNORMAL HIGH (ref 70–99)

## 2020-09-21 MED ORDER — FENTANYL CITRATE (PF) 100 MCG/2ML IJ SOLN
INTRAMUSCULAR | Status: AC
  Filled 2020-09-21: qty 2

## 2020-09-21 MED ORDER — GELATIN ABSORBABLE 12-7 MM EX MISC
CUTANEOUS | Status: AC
  Filled 2020-09-21: qty 1

## 2020-09-21 MED ORDER — SODIUM CHLORIDE 0.9 % IV SOLN
INTRAVENOUS | Status: AC | PRN
  Administered 2020-09-21: 10 mL/h via INTRAVENOUS

## 2020-09-21 MED ORDER — MIDAZOLAM HCL 2 MG/2ML IJ SOLN
INTRAMUSCULAR | Status: AC | PRN
  Administered 2020-09-21: 1 mg via INTRAVENOUS
  Administered 2020-09-21 (×2): 0.5 mg via INTRAVENOUS

## 2020-09-21 MED ORDER — FENTANYL CITRATE (PF) 100 MCG/2ML IJ SOLN
INTRAMUSCULAR | Status: AC | PRN
  Administered 2020-09-21: 50 ug via INTRAVENOUS
  Administered 2020-09-21 (×2): 25 ug via INTRAVENOUS

## 2020-09-21 MED ORDER — HYDROCODONE-ACETAMINOPHEN 5-325 MG PO TABS
ORAL_TABLET | ORAL | Status: AC
  Administered 2020-09-21: 2 via ORAL
  Filled 2020-09-21: qty 2

## 2020-09-21 MED ORDER — MIDAZOLAM HCL 2 MG/2ML IJ SOLN
INTRAMUSCULAR | Status: AC
  Filled 2020-09-21: qty 2

## 2020-09-21 MED ORDER — SODIUM CHLORIDE 0.9 % IV SOLN: INTRAVENOUS | Status: DC

## 2020-09-21 MED ORDER — LIDOCAINE HCL 1 % IJ SOLN
INTRAMUSCULAR | Status: AC
  Filled 2020-09-21: qty 10

## 2020-09-21 MED ORDER — HYDROCODONE-ACETAMINOPHEN 5-325 MG PO TABS
2.0000 | ORAL_TABLET | Freq: Once | ORAL | Status: AC
  Filled 2020-09-21: qty 2

## 2020-09-21 NOTE — Procedures (Signed)
Interventional Radiology Procedure:   Indications: History of uterine cancer with peritoneal disease   Procedure: Imaged biopsy of omental / peritoneal thickening  Findings: Soft tissue thickening in anterior abdominal cavity was biopsied.  Complications: No immediate complications noted.     EBL: Minimal  Plan: Discharge in 2 hours   Ameriah Lint R. Anselm Pancoast, MD  Pager: [phone]

## 2020-09-21 NOTE — H&P (Signed)
Chief Complaint: Patient was seen in consultation today for omental mass biopsy at the request of [place]  Referring Physician(s): Rossi,Emma  Supervising Physician: Markus Daft  Patient Status: [facility] - Out-pt  History of Present Illness: Janet [name] is a 50 y.o. female   Known early stage Uterine cancer-- hysterectomy 07/09/16 Has done well and without symptoms Follow up with Dr Denman George -pt complained of what felt like menstrual cramping  CT 09/07/20: New omental and peritoneal nodularity. Index nodule in the right lateral abdominal mentum measures 1.6 x 3.1 cm (2/51). Additional omental nodularity in the left paramidline abdomen and probable caking in the ventral midline, just above the umbilicus. Suspect serosal implants along the sigmoid colon (2/75 and 80). Again, overall resolution is markedly degraded by body habitus.   IMPRESSION: 1. Diagnostic quality is markedly degraded by body habitus. 2. New omental/peritoneal metastases. 3. Cirrhosis with portal hypertension.   Scheduled now for omental mass biopsy  Past Medical History:  Diagnosis Date   Anemia    Angio-edema    Anxiety    Arthritis    Asthma    Depression    Diabetes mellitus without complication (HCC)    Hypertension    Hypothyroidism    Pre-diabetes    Right foot drop    Sleep apnea    Thyroid disease    Urticaria    Uterine cancer ([place])     Past Surgical History:  Procedure Laterality Date   ABDOMINAL HYSTERECTOMY     CARPAL TUNNEL RELEASE     CESAREAN SECTION     CESAREAN SECTION N/A    Phreesia 03/14/2020   CHOLECYSTECTOMY     cyst removal     uterus   DILATION AND CURETTAGE OF UTERUS     still birth   DILATION AND CURETTAGE OF UTERUS N/A 06/11/2016   Procedure: DILATATION AND CURETTAGE;  Surgeon: Emily Filbert, MD;  Location: [facility];  Service: Gynecology;  Laterality: N/A;   ROBOTIC ASSISTED TOTAL HYSTERECTOMY WITH BILATERAL SALPINGO OOPHERECTOMY Bilateral 07/09/2016    Procedure: XI ROBOTIC ASSISTED TOTAL HYSTERECTOMY WITH BILATERAL SALPINGO OOPHORECTOMY FOR UTERUS GREATER THAN 250 GRAMS, LYSIS OF ADHESIONS;  Surgeon: Everitt Amber, MD;  Location: [facility];  Service: Gynecology;  Laterality: Bilateral;   SINOSCOPY     SINUS EXPLORATION     WISDOM TOOTH EXTRACTION      Allergies: Ciprofloxacin and Shrimp [shellfish allergy]  Medications: Prior to Admission medications   Medication Sig Start Date End Date Taking? Authorizing Provider  atorvastatin (LIPITOR) 20 MG tablet TAKE 1 TABLET BY MOUTH ONCE DAILY. 08/23/20  Yes McClung, Dionne Bucy, PA-C  Cholecalciferol (DIALYVITE VITAMIN D 5000) 125 MCG (5000 UT) capsule Take 5,000 Units by mouth daily.   Yes [provider]  DULoxetine (CYMBALTA) 60 MG capsule Take 1 capsule (60 mg total) by mouth daily. 08/23/20  Yes McClung, Angela M, PA-C  dupilumab (DUPIXENT) 300 MG/2ML prefilled syringe Inject 300 mg into the skin every 14 (fourteen) days. 12/13/19  Yes Kozlow, Donnamarie Poag, MD  EPINEPHrine 0.3 mg/0.3 mL IJ SOAJ injection Use as directed for life threatening allergic reactions 05/03/19  Yes Kozlow, Donnamarie Poag, MD  glipiZIDE (GLUCOTROL) 10 MG tablet Take 2 tablets (20 mg total) by mouth 2 (two) times daily before a meal. 08/23/20  Yes McClung, Angela M, PA-C  levothyroxine (SYNTHROID) 150 MCG tablet Take 1 tablet (150 mcg total) by mouth daily. 08/24/20  Yes McClung, Dionne Bucy, PA-C  liraglutide (VICTOZA) 18 MG/3ML SOPN INJECT 1.8 MG  SUBCUTANEOUSLY ONCE DAILY. 08/23/20  Yes Freeman Caldron M, PA-C  loratadine (CLARITIN) 10 MG tablet Take 10 mg by mouth daily.   Yes [provider]  LORazepam (ATIVAN) 0.5 MG tablet Take 1 tablet (0.5 mg total) by mouth 2 (two) times daily as needed for anxiety. Use sparingly 08/23/20  Yes McClung, Angela M, PA-C  losartan (COZAAR) 50 MG tablet Take 1.5 tablets (75 mg total) by mouth daily. 08/23/20  Yes Argentina Donovan, PA-C  Melatonin 10 MG CAPS Take 10 mg by mouth daily as needed  (sleep).   Yes [provider]  metFORMIN (GLUCOPHAGE) 500 MG tablet Take 2 tablets (1,000 mg total) by mouth 2 (two) times daily with a meal. 08/23/20  Yes McClung, Angela M, PA-C  montelukast (SINGULAIR) 10 MG tablet Take 1 tablet (10 mg total) by mouth at bedtime. 08/23/20  Yes Argentina Donovan, PA-C  Multiple Vitamin (MULTIVITAMIN) tablet Take 1 tablet by mouth daily.   Yes [provider]  omeprazole (PRILOSEC) 40 MG capsule Take 1 capsule (40 mg total) by mouth daily. 08/23/20  Yes Argentina Donovan, PA-C  OXcarbazepine (TRILEPTAL) 150 MG tablet Take 1 tablet by mouth twice daily 05/10/20  Yes Suzzanne Cloud, NP  pregabalin (LYRICA) 75 MG capsule Take 1 capsule by mouth twice daily 08/07/20  Yes Marcial Pacas, MD  albuterol (VENTOLIN HFA) 108 (90 Base) MCG/ACT inhaler Inhale 1-2 puffs into the lungs every 6 (six) hours as needed for wheezing or shortness of breath. 08/23/20   Argentina Donovan, PA-C  budesonide-formoterol (SYMBICORT) 160-4.5 MCG/ACT inhaler Inhale 2 puffs into the lungs 2 (two) times daily. Patient taking differently: Inhale 2 puffs into the lungs 2 (two) times daily as needed (shortness of breath). 08/23/20   Argentina Donovan, PA-C  cephALEXin (KEFLEX) 500 MG capsule Take 1 capsule (500 mg total) by mouth 2 (two) times daily. Patient not taking: No sig reported 09/10/20   Charlott Rakes, MD  EQ ALLERGY RELIEF, CETIRIZINE, 10 MG tablet Take 1 tablet by mouth once daily Patient not taking: No sig reported 03/01/19   Charlott Rakes, MD  fluticasone (FLONASE) 50 MCG/ACT nasal spray Place 2 sprays into both nostrils daily. Patient taking differently: Place 2 sprays into both nostrils daily as needed for allergies. 08/23/20   Argentina Donovan, PA-C  Insulin Pen Needle (BD PEN NEEDLE NANO 2ND GEN) 32G X 4 MM MISC USE AS DIRECTED 08/23/20   Argentina Donovan, PA-C  triamcinolone cream (KENALOG) 0.1 % Apply 1 application topically 2 (two) times daily. Patient not taking: No  sig reported 06/10/19   Charlott Rakes, MD     Family History  Problem Relation Age of Onset   Diabetes Mother    Heart attack Mother    Allergic rhinitis Mother    Suicidality Father    Hyperlipidemia Father     Social History   Socioeconomic History   Marital status: Widowed    Spouse name: Not on file   Number of children: 1   Years of education: college   Highest education level: Not on file  Occupational History   Occupation: Unemployed   Occupation: DISABLED  Tobacco Use   Smoking status: Never   Smokeless tobacco: Never  Vaping Use   Vaping Use: Never used  Substance and Sexual Activity   Alcohol use: No   Drug use: No   Sexual activity: Not Currently    Birth control/protection: None  Other Topics Concern   Not on file  Social History Narrative   Lives at home with her son.   Right-handed.   2 cups caffeine per day.   Social Determinants of Health   Financial Resource Strain: Not on file  Food Insecurity: Not on file  Transportation Needs: Not on file  Physical Activity: Not on file  Stress: Not on file  Social Connections: Not on file     Review of Systems: A 12 point ROS discussed and pertinent positives are indicated in the HPI above.  All other systems are negative.  Review of Systems  Constitutional:  Negative for activity change, appetite change, fatigue and unexpected weight change.  Respiratory:  Negative for cough and shortness of breath.   Cardiovascular:  Negative for chest pain.  Gastrointestinal:  Negative for abdominal pain.  Psychiatric/Behavioral:  Negative for behavioral problems and confusion.    Vital Signs: BP (!) 129/52   Pulse 89   Temp 98 F (36.7 C) (Oral)   Resp 20   Ht '5\' 7"'$  (1.702 m)   Wt (!) 410 lb (186 kg)   LMP  (LMP Unknown) Comment: irregular periods previously, bleeding started in january  SpO2 97%   BMI 64.22 kg/m   Physical Exam Vitals reviewed.  HENT:     Mouth/Throat:     Mouth: Mucous membranes  are moist.  Cardiovascular:     Rate and Rhythm: Normal rate and regular rhythm.     Heart sounds: Murmur heard.  Pulmonary:     Effort: Pulmonary effort is normal.     Breath sounds: Normal breath sounds.  Abdominal:     Palpations: Abdomen is soft.  Musculoskeletal:        General: Normal range of motion.  Skin:    General: Skin is warm.  Neurological:     Mental Status: She is alert and oriented to person, place, and time.  Psychiatric:        Behavior: Behavior normal.    Imaging: CT Abdomen Pelvis W Contrast  Result Date: 09/08/2020 CLINICAL DATA:  Uterine cancer, adenopathy. EXAM: CT ABDOMEN AND PELVIS WITH CONTRAST TECHNIQUE: Multidetector CT imaging of the abdomen and pelvis was performed using the standard protocol following bolus administration of intravenous contrast. CONTRAST:  53m OMNIPAQUE IOHEXOL 350 MG/ML SOLN COMPARISON:  12/24/2018. FINDINGS: Lower chest: Lingular scarring. Heart is at the upper limits of normal in size. No pericardial or pleural effusion. Distal esophagus is unremarkable. Gastroesophageal varices. Hepatobiliary: Liver margin is irregular. Overall resolution is degraded by body habitus. Cholecystectomy. No biliary ductal dilatation. Pancreas: Grossly negative. Spleen: Enlarged, 18.3 cm. Adrenals/Urinary Tract: Adrenal glands, kidneys and bladder are grossly unremarkable. Stomach/Bowel: Stomach, small bowel and colon are grossly unremarkable. Vascular/Lymphatic: Extensive gastroesophageal and splenorenal varices. Abdominal retroperitoneal lymph nodes are not enlarged by CT size criteria. Reproductive: Hysterectomy.  No adnexal mass. Other: New omental and peritoneal nodularity. Index nodule in the right lateral abdominal mentum measures 1.6 x 3.1 cm (2/51). Additional omental nodularity in the left paramidline abdomen and probable caking in the ventral midline, just above the umbilicus. Suspect serosal implants along the sigmoid colon (2/75 and 80). Again,  overall resolution is markedly degraded by body habitus. Musculoskeletal: Degenerative changes in the spine. No worrisome lytic or sclerotic lesions. IMPRESSION: 1. Diagnostic quality is markedly degraded by body habitus. 2. New omental/peritoneal metastases. 3. Cirrhosis with portal hypertension. Electronically Signed   By: MLorin PicketM.D.   On: 09/08/2020 13:05    Labs:  CBC: Recent Labs    08/11/20 0000 09/18/20  0000  WBC 2.7 2.9  HGB 10.7* 10.4*  HCT 33* 32*  PLT 83* 104*    COAGS: Recent Labs    09/21/20 0615  INR 1.5*    BMP: Recent Labs    10/27/19 1045 08/11/20 0000 08/23/20 1135  NA 141 139 140  K 4.1 3.9 3.7  CL 104 108 104  CO2 '22 20 20  '$ GLUCOSE 110*  --  129*  BUN '7 7 7  '$ CALCIUM 9.1 8.9 8.6*  CREATININE 0.84 0.5 0.77  GFRNONAA 81  --   --   GFRAA 93  --   --     LIVER FUNCTION TESTS: Recent Labs    08/11/20 0000 08/23/20 1135  BILITOT  --  1.9*  AST 61* 48*  ALT 31 25  ALKPHOS 162* 107  PROT  --  6.5  ALBUMIN 3.7 3.4*    TUMOR MARKERS: No results for input(s): AFPTM, CEA, CA199, CHROMGRNA in the last 8760 hours.  Assessment and Plan:  Hx early stage Uterine cancer 2018 New findings of omental mass and lymphadenopathy Scheduled for omental mass bx Risks and benefits of omental mass bx was discussed with the patient and/or patient's family including, but not limited to bleeding, infection, damage to adjacent structures or low yield requiring additional tests.  All of the questions were answered and there is agreement to proceed.  Consent signed and in chart.   Thank you for this interesting consult.  I greatly enjoyed meeting Janet [name] and look forward to participating in their care.  A copy of this report was sent to the requesting provider on this date.  Electronically Signed: Lavonia Drafts, PA-C 09/21/2020, 7:13 AM   I spent a total of  30 Minutes   in face to face in clinical consultation, greater than 50% of which  was counseling/coordinating care for omental mass bx

## 2020-09-25 ENCOUNTER — Telehealth: Payer: Self-pay | Admitting: *Deleted

## 2020-09-25 LAB — SURGICAL PATHOLOGY

## 2020-09-26 ENCOUNTER — Inpatient Hospital Stay (INDEPENDENT_AMBULATORY_CARE_PROVIDER_SITE_OTHER): Payer: Medicaid Other | Admitting: Genetic Counselor

## 2020-09-26 DIAGNOSIS — Z8042 Family history of malignant neoplasm of prostate: Secondary | ICD-10-CM

## 2020-09-26 DIAGNOSIS — Z800 Family history of malignant neoplasm of digestive organs: Secondary | ICD-10-CM | POA: Diagnosis not present

## 2020-09-26 DIAGNOSIS — C541 Malignant neoplasm of endometrium: Secondary | ICD-10-CM

## 2020-09-27 ENCOUNTER — Encounter: Payer: Self-pay | Admitting: Oncology

## 2020-09-27 ENCOUNTER — Other Ambulatory Visit: Payer: Self-pay | Admitting: Oncology

## 2020-09-27 NOTE — Progress Notes (Signed)
[facility]  [address] [place],    [address] [phone]  Clinic Day:  09/28/2020  Referring physician: Charlott Rakes, MD  This document serves as a record of services personally performed by Dequincy Macarthur Critchley, MD. It was created on their behalf by Curry,Lauren E, a trained medical scribe. The creation of this record is based on the scribe's personal observations and the provider's statements to them.  HISTORY OF PRESENT ILLNESS:  The patient is a 50 y.o. female  with anemia and thrombocytopenia. However, recent CT scans ordered by gynecology-oncology showed findings highly worrisome for omental metastasis.  She comes in today to go over recent omental biopsy results.  Since her last visit, the patient has been doing okay.  However, she has begun to have cramping-type abdominal pain.  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  All prior scans up until recently had shown no evidence of disease recurrence.      PHYSICAL EXAM:  Blood pressure (!) 141/63, pulse (!) 102, temperature 98.7 F (37.1 C), resp. rate 20, height 5' 7.5" (1.715 m), weight (!) 422 lb 1.6 oz (191.5 kg), SpO2 93 %. Wt Readings from Last 3 Encounters:  09/28/20 (!) 422 lb 1.6 oz (191.5 kg)  09/21/20 (!) 410 lb (186 kg)  09/18/20 (!) 422 lb 6.4 oz (191.6 kg)   Body mass index is 65.13 kg/m. Performance status (ECOG): 1 - Symptomatic but completely ambulatory Physical Exam Constitutional:      Appearance: Normal appearance. She is not ill-appearing.  HENT:     Mouth/Throat:     Mouth: Mucous membranes are moist.     Pharynx: Oropharynx is clear. No oropharyngeal exudate or posterior oropharyngeal erythema.  Cardiovascular:     Rate and Rhythm: Normal rate and regular rhythm.     Heart sounds: No murmur heard.   No friction rub. No gallop.  Pulmonary:     Effort: Pulmonary effort is normal. No respiratory distress.      Breath sounds: Normal breath sounds. No wheezing, rhonchi or rales.  Abdominal:     General: Bowel sounds are normal. There is no distension.     Palpations: Abdomen is soft. There is no mass.     Tenderness: no abdominal tenderness  Musculoskeletal:        General: No swelling.     Right lower leg: No edema.     Left lower leg: No edema.  Lymphadenopathy:     Cervical: No cervical adenopathy.     Upper Body:     Right upper body: No supraclavicular or axillary adenopathy.     Left upper body: No supraclavicular or axillary adenopathy.     Lower Body: No right inguinal adenopathy. No left inguinal adenopathy.  Skin:    General: Skin is warm.     Coloration: Skin is not jaundiced.     Findings: No lesion or rash.  Neurological:     General: No focal deficit present.     Mental Status: She is alert and oriented to person, place, and time. Mental status is at baseline.     Cranial Nerves: Cranial nerves are intact.  Psychiatric:        Mood and Affect: Mood normal.        Behavior: Behavior normal.        Thought Content: Thought content normal.    SURGICAL PATHOLOGY  THIS IS AN ADDENDUM REPORT CASE: [id number]  PATIENT: [name]  Surgical Pathology Report  Addendum   Reason for Addendum #1:  Immunohistochemistry results   Clinical History: uterine cancer with omental/peritoneal disease (cm)    FINAL MICROSCOPIC DIAGNOSIS:   A. SOFT TISSUE, MIDLINE OMENTUM, NEEDLE CORE BIOPSY:  - Metastatic adenocarcinoma  -  See comment   COMMENT:   Immunohistochemistry is pending and will be reported in an addendum.   ADDENDUM:   The neoplastic cells are positive for PAX 8 and patchy cytokeratin 7 but  negative for cytokeratin 20 and CDX2.  The immunoprofile is consistent  with the patient's history of endometrioid f.   LABS:   CBC Latest Ref Rng & Units 09/18/2020 08/11/2020 12/08/2018  WBC - 2.9 2.7 5.7  Hemoglobin 12.0 - 16.0 10.4(A) 10.7(A) 15.0  Hematocrit 36  - 46 32(A) 33(A) 43.4  Platelets 150 - 399 104(A) 83(A) 84(LL)   Component Ref Range & Units 3 wk ago   Iron 28 - 170 ug/dL 63   TIBC 250 - 450 ug/dL 460 High    Saturation Ratios 10.4 - 31.8 % 14   UIBC ug/dL 397    Ferritin 11 - 307 ng/mL 12    Vitamin B-12 180 - 914 pg/mL 958 High     Folate >5.9 ng/mL 30.6     ASSESSMENT & PLAN:  A 50 y.o. female who unfortunately has metastatic endometrial cancer.  She understands she is ultimately dealing with incurable disease.  Moving forward, the main goal will be to give her the best possible therapies to keep her disease under control for as long as possible.  She will start with 6 cycles of carboplatin/paclitaxel.  Each cycle will be repeated every 3 weeks.  She was made aware of the side effects that go along with this regimen, including alopecia, nausea, cytopenias, and peripheral neuropathy.  She will receive Neulasta with each cycle of treatment to prevent severe neutropenia from delaying successive cycles.  I will arrange for her to have a port placed through which all of her chemotherapy will be given.  Her tumor will also be sent to [facility] to determine if it may sensitive to any form of targeted therapy.  Her 1st cycle of carboplatin/paclitaxel will likely commence within the next 7-10 days.  For her pain, she will be prescribed oxycodone 5 mg Q6h prn.  I will see her back 3 weeks later before she heads into her 2nd cycle.  The patient understands all the plans discussed today and is in agreement with them.   I, Rita Ohara, am acting as scribe for Marice Potter, MD    I have reviewed this report as typed by the medical scribe, and it is complete and accurate.  Dequincy Macarthur Critchley, MD

## 2020-09-27 NOTE — Addendum Note (Signed)
Addended by: Juanetta Beets on: 09/27/2020 02:37 PM   Modules accepted: Orders

## 2020-09-28 ENCOUNTER — Encounter: Payer: Self-pay | Admitting: Oncology

## 2020-09-28 ENCOUNTER — Inpatient Hospital Stay: Payer: Medicaid Other

## 2020-09-28 ENCOUNTER — Telehealth: Payer: Self-pay | Admitting: Oncology

## 2020-09-28 ENCOUNTER — Other Ambulatory Visit: Payer: Self-pay | Admitting: Oncology

## 2020-09-28 ENCOUNTER — Other Ambulatory Visit: Payer: Self-pay

## 2020-09-28 ENCOUNTER — Inpatient Hospital Stay (INDEPENDENT_AMBULATORY_CARE_PROVIDER_SITE_OTHER): Payer: Medicaid Other | Admitting: Oncology

## 2020-09-28 ENCOUNTER — Encounter: Payer: Self-pay | Admitting: Genetic Counselor

## 2020-09-28 VITALS — BP 141/63 | HR 102 | Temp 98.7°F | Resp 20 | Ht 67.5 in | Wt >= 6400 oz

## 2020-09-28 DIAGNOSIS — C541 Malignant neoplasm of endometrium: Secondary | ICD-10-CM | POA: Diagnosis not present

## 2020-09-28 DIAGNOSIS — Z8042 Family history of malignant neoplasm of prostate: Secondary | ICD-10-CM | POA: Insufficient documentation

## 2020-09-28 DIAGNOSIS — C786 Secondary malignant neoplasm of retroperitoneum and peritoneum: Secondary | ICD-10-CM | POA: Diagnosis not present

## 2020-09-28 DIAGNOSIS — C549 Malignant neoplasm of corpus uteri, unspecified: Secondary | ICD-10-CM

## 2020-09-28 DIAGNOSIS — Z800 Family history of malignant neoplasm of digestive organs: Secondary | ICD-10-CM | POA: Insufficient documentation

## 2020-09-28 MED ORDER — OXYCODONE HCL 5 MG PO TABS: 5.0000 mg | ORAL_TABLET | Freq: Four times a day (QID) | ORAL | 0 refills | Status: DC | PRN

## 2020-09-28 NOTE — Progress Notes (Signed)
START ON PATHWAY REGIMEN - Uterine     A cycle is every 21 days:     Paclitaxel      Carboplatin   **Always confirm dose/schedule in your pharmacy ordering system**  Patient Characteristics: Endometrioid, Newly Diagnosed (Clinical Staging), Nonsurgical Candidate, Stage III-IV Histology: Endometrioid Therapeutic Status: Newly Diagnosed (Clinical Staging) AJCC M Category: pM1 AJCC 8 Stage Grouping: IVB AJCC T Category: cTX AJCC N Category: cNX  Intent of Therapy: Non-Curative / Palliative Intent, Discussed with Patient

## 2020-09-28 NOTE — Progress Notes (Signed)
REFERRING PROVIDER: Marice Potter, MD [place],  [place] [address]  PRIMARY PROVIDER:  Charlott Rakes, MD  PRIMARY REASON FOR VISIT:  1. Endometrial cancer ([place])   2. Family history of colon cancer   3. Family history of prostate cancer    HISTORY OF PRESENT ILLNESS:   Janet [name], a 50 y.o. female, was seen for a [facility] cancer genetics consultation at the request of Dr. Bobby Rumpf due to a personal history of cancer.  Janet [name] presents to clinic today to discuss the possibility of a hereditary predisposition to cancer, to discuss genetic testing, and to further clarify her future cancer risks, as well as potential cancer risks for family members.   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 The treatment plan included hysterectomy. In 2022, at the age of 10, CT scan and biopsy is suggestive of recurrent endometrial cancer.    RISK FACTORS:  Menarche was at age unknown.  First live birth at age 62.  OCP use for less than 1 year.  Ovaries intact: no.  Hysterectomy: yes.  HRT use: 0 years. Colonoscopy: yes; up to date Any excessive radiation exposure in the past: no  Past Medical History:  Diagnosis Date   Anemia    Angio-edema    Anxiety    Arthritis    Asthma    Depression    Diabetes mellitus without complication (HCC)    Hypertension    Hypothyroidism    Pre-diabetes    Right foot drop    Sleep apnea    Thyroid disease    Urticaria    Uterine cancer ([place])     Past Surgical History:  Procedure Laterality Date   ABDOMINAL HYSTERECTOMY     CARPAL TUNNEL RELEASE     CESAREAN SECTION     CESAREAN SECTION N/A    [employer] 03/14/2020   CHOLECYSTECTOMY     cyst removal     uterus   DILATION AND CURETTAGE OF UTERUS     still birth   DILATION AND CURETTAGE OF UTERUS N/A 06/11/2016   Procedure: DILATATION AND CURETTAGE;  Surgeon: Emily Filbert, MD;  Location: [facility];   Service: Gynecology;  Laterality: N/A;   ROBOTIC ASSISTED TOTAL HYSTERECTOMY WITH BILATERAL SALPINGO OOPHERECTOMY Bilateral 07/09/2016   Procedure: XI ROBOTIC ASSISTED TOTAL HYSTERECTOMY WITH BILATERAL SALPINGO OOPHORECTOMY FOR UTERUS GREATER THAN 250 GRAMS, LYSIS OF ADHESIONS;  Surgeon: Everitt Amber, MD;  Location: [facility];  Service: Gynecology;  Laterality: Bilateral;   SINOSCOPY     SINUS EXPLORATION     WISDOM TOOTH EXTRACTION      Social History   Socioeconomic History   Marital status: Widowed    Spouse name: Not on file   Number of children: 1   Years of education: college   Highest education level: Not on file  Occupational History   Occupation: Unemployed   Occupation: DISABLED  Tobacco Use   Smoking status: Never   Smokeless tobacco: Never  Vaping Use   Vaping Use: Never used  Substance and Sexual Activity   Alcohol use: No   Drug use: No   Sexual activity: Not Currently    Birth control/protection: None  Other Topics Concern   Not on file  Social History Narrative   Lives at home with her son.   Right-handed.   2 cups caffeine per day.   Social Determinants of Health   Financial  Resource Strain: Not on file  Food Insecurity: Not on file  Transportation Needs: Not on file  Physical Activity: Not on file  Stress: Not on file  Social Connections: Not on file     FAMILY HISTORY:  We obtained a detailed, 4-generation family history.  Significant diagnoses are listed below: Family History  Problem Relation Age of Onset   Prostate cancer Father        dx after 81   Colon cancer Maternal Aunt        dx after 30    Janet [name] is unaware of previous family history of genetic testing for hereditary cancer risks. There is no reported Ashkenazi Jewish ancestry. There is no known consanguinity.  GENETIC COUNSELING ASSESSMENT: Janet [name] is a 50 y.o. female with a personal history of endometrial cancer which is somewhat suggestive of a hereditary cancer syndrome  and predisposition to cancer given her age of diagnosis. We, therefore, discussed and recommended the following at today's visit.   DISCUSSION: We discussed that 5 - 10% of cancer is hereditary, with most cases of endometrial cancer associated with mutations in the Lynch syndrome genes.  There are other genes that can be associated with hereditary uterine, colon, and prostate cancer syndromes.  Type of cancer risk and level of risk are gene-specific.  We discussed that testing is beneficial for several reasons including knowing how to follow individuals for their cancer risks and and understanding if other family members could be at risk for cancer and allowing them to undergo genetic testing.   We reviewed the characteristics, features and inheritance patterns of hereditary cancer syndromes. We also discussed genetic testing, including the appropriate family members to test, the process of testing, insurance coverage and turn-around-time for results. We discussed the implications of a negative, positive, carrier and/or variant of uncertain significant result. We recommended Janet [name] pursue genetic testing for a panel that includes genes associated with uterine, colon, and prostate cancers.    The Common Hereditary Cancers + RNA Panel offered by [facility] includes sequencing, deletion/duplication, and RNA testing of the following 47 genes: APC, ATM, AXIN2, BARD1, BMPR1A, BRCA1, BRCA2, BRIP1, CDH1, CDK4*, CDKN2A (p14ARF)*, CDKN2A (p16INK4a)*, CHEK2, CTNNA1, DICER1, EPCAM (Deletion/duplication testing only), GREM1 (promoter region deletion/duplication testing only), KIT, MEN1, MLH1, MSH2, MSH3, MSH6, MUTYH, NBN, NF1, NHTL1, PALB2, PDGFRA*, PMS2, POLD1, POLE, PTEN, RAD50, RAD51C, RAD51D, SDHB, SDHC, SDHD, SMAD4, SMARCA4. STK11, TP53, TSC1, TSC2, and VHL.  The following genes were evaluated for sequence changes only: SDHA and HOXB13 c.251G>A variant only.  RNA analysis is not performed for the * genes.    Based  on Janet [name]'s personal history of cancer, she meets medical criteria for genetic testing. Despite that she meets criteria, she may still have an out of pocket cost. We discussed that if her out of pocket cost for testing is over $100, the laboratory will contact her to discuss self-pay prices and/or patient pay assistance programs.   PLAN: After considering the risks, benefits, and limitations, Janet [name] provided informed consent to pursue genetic testing.  The blood sample will be drawn and sent to [facility] for analysis of the Common Hereditary Cancers +RNA panel. Results should be available within approximately 3 weeks' time, at which point they will be disclosed by telephone to Janet [name], as will any additional recommendations warranted by these results. Janet [name] will receive a summary of her genetic counseling visit and a copy of her results once available. This information will also be available in  E[facility].   Lastly, we encouraged Janet [name] to remain in contact with cancer genetics annually so that we can continuously update the family history and inform her of any changes in cancer genetics and testing that may be of benefit for this family.   Janet [name] questions were answered to her satisfaction today. Our contact information was provided should additional questions or concerns arise. Thank you for the referral and allowing [place] to share in the care of your patient.   Mattox Schorr M. Joette Catching, [place], [facility] Genetic Counselor [email or web address] (P) [phone]  The patient was seen for a total of 35 minutes in face-to-face genetic counseling.  The patient was seen alone.  Drs. Magrinat, Lindi Adie and/or Burr Medico were available to discuss this case as needed.    _______________________________________________________________________ For Office Staff:  Number of people involved in session: 1 Was an Intern/ student involved with case: no

## 2020-09-28 NOTE — Telephone Encounter (Signed)
Per 8/18 LOS/Dr Lewis/[name] - Patient scheduled for 8/23 Patient Education at 2:30 pm  Per Dr Bobby Rumpf IV [place] to be done day when Injection is given  after Infusion  Infusion will be scheduled after receiving [facility]

## 2020-09-29 ENCOUNTER — Other Ambulatory Visit: Payer: Medicaid Other

## 2020-09-29 ENCOUNTER — Inpatient Hospital Stay: Payer: Medicaid Other | Admitting: Oncology

## 2020-09-29 LAB — CA 125: Cancer Antigen (CA) 125: 555 U/mL — ABNORMAL HIGH (ref 0.0–38.1)

## 2020-09-29 LAB — CEA: CEA: 5.4 ng/mL — ABNORMAL HIGH (ref 0.0–4.7)

## 2020-10-03 ENCOUNTER — Encounter: Payer: Self-pay | Admitting: Hematology and Oncology

## 2020-10-03 ENCOUNTER — Inpatient Hospital Stay (INDEPENDENT_AMBULATORY_CARE_PROVIDER_SITE_OTHER): Payer: Medicaid Other | Admitting: Hematology and Oncology

## 2020-10-03 VITALS — BP 160/69 | HR 91 | Temp 98.4°F | Resp 18 | Ht 67.5 in | Wt >= 6400 oz

## 2020-10-03 DIAGNOSIS — C541 Malignant neoplasm of endometrium: Secondary | ICD-10-CM

## 2020-10-03 MED ORDER — PROCHLORPERAZINE MALEATE 10 MG PO TABS: 10.0000 mg | ORAL_TABLET | Freq: Four times a day (QID) | ORAL | 3 refills | Status: DC | PRN

## 2020-10-03 MED ORDER — ZOLPIDEM TARTRATE 10 MG PO TABS: 10.0000 mg | ORAL_TABLET | Freq: Every evening | ORAL | 0 refills | Status: AC | PRN

## 2020-10-03 MED ORDER — DEXAMETHASONE 4 MG PO TABS: ORAL_TABLET | ORAL | 0 refills | Status: DC

## 2020-10-03 MED ORDER — ONDANSETRON HCL 4 MG PO TABS: 4.0000 mg | ORAL_TABLET | ORAL | 3 refills | Status: DC | PRN

## 2020-10-03 NOTE — Progress Notes (Signed)
[facility]  Telephone:([phone] Fax:([phone]) [phone]  Patient Care Team: Charlott Rakes, MD as PCP - General (Family Medicine)   Name of the patient: Janet [name]  [id number]  1970-05-08   Date of visit: 10/03/20  Diagnosis- Endometrial Cancer  Chief complaint/Reason for visit- Initial Meeting for [facility], preparing for starting chemotherapy  Patient presents to clinic with her son for today's visit.   Heme/Onc history:  Oncology History   No history exists.    Interval history-  Patient presents to chemo care clinic today for initial meeting in preparation for starting chemotherapy. I introduced the chemo care clinic and we discussed that the role of the clinic is to assist those who are at an increased risk of emergency room visits and/or complications during the course of chemotherapy treatment. We discussed that the increased risk takes into account factors such as age, performance status, and co-morbidities. We also discussed that for some, this might include barriers to care such as not having a primary care provider, lack of insurance/transportation, or not being able to afford medications. We discussed that the goal of the program is to help prevent unplanned ER visits and help reduce complications during chemotherapy. We do this by discussing specific risk factors to each individual and identifying ways that we can help improve these risk factors and reduce barriers to care.   Allergies  Allergen Reactions   Ciprofloxacin Shortness Of Breath   Shrimp [Shellfish Allergy] Other (See Comments)    positive allergy test     Past Medical History:  Diagnosis Date   Anemia    Angio-edema    Anxiety    Arthritis    Asthma    Depression    Diabetes mellitus without complication (HCC)    Hypertension    Hypothyroidism    Pre-diabetes    Right foot drop    Sleep apnea    Thyroid disease    Urticaria     Uterine cancer ([place])     Past Surgical History:  Procedure Laterality Date   ABDOMINAL HYSTERECTOMY     CARPAL TUNNEL RELEASE     CESAREAN SECTION     CESAREAN SECTION N/A    Phreesia 03/14/2020   CHOLECYSTECTOMY     cyst removal     uterus   DILATION AND CURETTAGE OF UTERUS     still birth   DILATION AND CURETTAGE OF UTERUS N/A 06/11/2016   Procedure: DILATATION AND CURETTAGE;  Surgeon: Emily Filbert, MD;  Location: [facility];  Service: Gynecology;  Laterality: N/A;   ROBOTIC ASSISTED TOTAL HYSTERECTOMY WITH BILATERAL SALPINGO OOPHERECTOMY Bilateral 07/09/2016   Procedure: XI ROBOTIC ASSISTED TOTAL HYSTERECTOMY WITH BILATERAL SALPINGO OOPHORECTOMY FOR UTERUS GREATER THAN 250 GRAMS, LYSIS OF ADHESIONS;  Surgeon: Everitt Amber, MD;  Location: [facility];  Service: Gynecology;  Laterality: Bilateral;   SINOSCOPY     SINUS EXPLORATION     WISDOM TOOTH EXTRACTION      Social History   Socioeconomic History   Marital status: Widowed    Spouse name: Not on file   Number of children: 1   Years of education: college   Highest education level: Not on file  Occupational History   Occupation: Unemployed   Occupation: DISABLED  Tobacco Use   Smoking status: Never   Smokeless tobacco: Never  Vaping Use   Vaping Use: Never used  Substance and Sexual Activity   Alcohol use: No   Drug use:  No   Sexual activity: Not Currently    Birth control/protection: None  Other Topics Concern   Not on file  Social History Narrative   Lives at home with her son.   Right-handed.   2 cups caffeine per day.   Social Determinants of Health   Financial Resource Strain: Not on file  Food Insecurity: Not on file  Transportation Needs: Not on file  Physical Activity: Not on file  Stress: Not on file  Social Connections: Not on file  Intimate Partner Violence: Not on file    Family History  Problem Relation Age of Onset   Diabetes Mother    Heart attack Mother    Allergic rhinitis Mother     Suicidality Father    Hyperlipidemia Father    Prostate cancer Father        dx after 64   Colon cancer Maternal Aunt        dx after 91     Current Outpatient Medications:    albuterol (VENTOLIN HFA) 108 (90 Base) MCG/ACT inhaler, Inhale 1-2 puffs into the lungs every 6 (six) hours as needed for wheezing or shortness of breath., Disp: 54 g, Rfl: 3   atorvastatin (LIPITOR) 20 MG tablet, TAKE 1 TABLET BY MOUTH ONCE DAILY., Disp: 30 tablet, Rfl: 5   budesonide-formoterol (SYMBICORT) 160-4.5 MCG/ACT inhaler, Inhale 2 puffs into the lungs 2 (two) times daily. (Patient taking differently: Inhale 2 puffs into the lungs 2 (two) times daily as needed (shortness of breath).), Disp: 1 each, Rfl: 5   cephALEXin (KEFLEX) 500 MG capsule, Take 1 capsule (500 mg total) by mouth 2 (two) times daily. (Patient not taking: No sig reported), Disp: 10 capsule, Rfl: 0   Cholecalciferol (DIALYVITE VITAMIN D 5000) 125 MCG (5000 UT) capsule, Take 5,000 Units by mouth daily., Disp: , Rfl:    DULoxetine (CYMBALTA) 60 MG capsule, Take 1 capsule (60 mg total) by mouth daily., Disp: 30 capsule, Rfl: 5   dupilumab (DUPIXENT) 300 MG/2ML prefilled syringe, Inject 300 mg into the skin every 14 (fourteen) days., Disp: 4 mL, Rfl: 11   EPINEPHrine 0.3 mg/0.3 mL IJ SOAJ injection, Use as directed for life threatening allergic reactions, Disp: 2 each, Rfl: 3   EQ ALLERGY RELIEF, CETIRIZINE, 10 MG tablet, Take 1 tablet by mouth once daily (Patient not taking: No sig reported), Disp: 90 tablet, Rfl: 0   fluticasone (FLONASE) 50 MCG/ACT nasal spray, Place 2 sprays into both nostrils daily. (Patient taking differently: Place 2 sprays into both nostrils daily as needed for allergies.), Disp: 16 g, Rfl: 6   glipiZIDE (GLUCOTROL) 10 MG tablet, Take 2 tablets (20 mg total) by mouth 2 (two) times daily before a meal., Disp: 360 tablet, Rfl: 1   Insulin Pen Needle (BD PEN NEEDLE NANO 2ND GEN) 32G X 4 MM MISC, USE AS DIRECTED, Disp: 100 each,  Rfl: 5   levothyroxine (SYNTHROID) 150 MCG tablet, Take 1 tablet (150 mcg total) by mouth daily., Disp: 90 tablet, Rfl: 3   liraglutide (VICTOZA) 18 MG/3ML SOPN, INJECT 1.8 MG SUBCUTANEOUSLY ONCE DAILY., Disp: 9 mL, Rfl: 3   loratadine (CLARITIN) 10 MG tablet, Take 10 mg by mouth daily., Disp: , Rfl:    LORazepam (ATIVAN) 0.5 MG tablet, Take 1 tablet (0.5 mg total) by mouth 2 (two) times daily as needed for anxiety. Use sparingly, Disp: 15 tablet, Rfl: 0   losartan (COZAAR) 50 MG tablet, Take 1.5 tablets (75 mg total) by mouth daily., Disp: 130 tablet,  Rfl: 0   Melatonin 10 MG CAPS, Take 10 mg by mouth daily as needed (sleep)., Disp: , Rfl:    metFORMIN (GLUCOPHAGE) 500 MG tablet, Take 2 tablets (1,000 mg total) by mouth 2 (two) times daily with a meal., Disp: 360 tablet, Rfl: 1   montelukast (SINGULAIR) 10 MG tablet, Take 1 tablet (10 mg total) by mouth at bedtime., Disp: 30 tablet, Rfl: 4   Multiple Vitamin (MULTIVITAMIN) tablet, Take 1 tablet by mouth daily., Disp: , Rfl:    omeprazole (PRILOSEC) 40 MG capsule, Take 1 capsule (40 mg total) by mouth daily., Disp: 30 capsule, Rfl: 3   OXcarbazepine (TRILEPTAL) 150 MG tablet, Take 1 tablet by mouth twice daily, Disp: 60 tablet, Rfl: 3   oxyCODONE (OXY IR/ROXICODONE) 5 MG immediate release tablet, Take 1 tablet (5 mg total) by mouth every 6 (six) hours as needed for severe pain., Disp: 40 tablet, Rfl: 0   pregabalin (LYRICA) 75 MG capsule, Take 1 capsule by mouth twice daily, Disp: 180 capsule, Rfl: 0   triamcinolone cream (KENALOG) 0.1 %, Apply 1 application topically 2 (two) times daily. (Patient not taking: No sig reported), Disp: 80 g, Rfl: 0  CMP Latest Ref Rng & Units 08/23/2020  Glucose 65 - 99 mg/dL 129(H)  BUN 6 - 24 mg/dL 7  Creatinine 0.57 - 1.00 mg/dL 0.77  Sodium 134 - 144 mmol/L 140  Potassium 3.5 - 5.2 mmol/L 3.7  Chloride 96 - 106 mmol/L 104  CO2 20 - 29 mmol/L 20  Calcium 8.7 - 10.2 mg/dL 8.6(L)  Total Protein 6.0 - 8.5 g/dL  6.5  Total Bilirubin 0.0 - 1.2 mg/dL 1.9(H)  Alkaline Phos 44 - 121 IU/L 107  AST 0 - 40 IU/L 48(H)  ALT 0 - 32 IU/L 25   CBC Latest Ref Rng & Units 09/18/2020  WBC - 2.9  Hemoglobin 12.0 - 16.0 10.4(A)  Hematocrit 36 - 46 32(A)  Platelets 150 - 399 104(A)    No images are attached to the encounter.  CT Abdomen Pelvis W Contrast  Result Date: 09/08/2020 CLINICAL DATA:  Uterine cancer, adenopathy. EXAM: CT ABDOMEN AND PELVIS WITH CONTRAST TECHNIQUE: Multidetector CT imaging of the abdomen and pelvis was performed using the standard protocol following bolus administration of intravenous contrast. CONTRAST:  38m OMNIPAQUE IOHEXOL 350 MG/ML SOLN COMPARISON:  12/24/2018. FINDINGS: Lower chest: Lingular scarring. Heart is at the upper limits of normal in size. No pericardial or pleural effusion. Distal esophagus is unremarkable. Gastroesophageal varices. Hepatobiliary: Liver margin is irregular. Overall resolution is degraded by body habitus. Cholecystectomy. No biliary ductal dilatation. Pancreas: Grossly negative. Spleen: Enlarged, 18.3 cm. Adrenals/Urinary Tract: Adrenal glands, kidneys and bladder are grossly unremarkable. Stomach/Bowel: Stomach, small bowel and colon are grossly unremarkable. Vascular/Lymphatic: Extensive gastroesophageal and splenorenal varices. Abdominal retroperitoneal lymph nodes are not enlarged by CT size criteria. Reproductive: Hysterectomy.  No adnexal mass. Other: New omental and peritoneal nodularity. Index nodule in the right lateral abdominal mentum measures 1.6 x 3.1 cm (2/51). Additional omental nodularity in the left paramidline abdomen and probable caking in the ventral midline, just above the umbilicus. Suspect serosal implants along the sigmoid colon (2/75 and 80). Again, overall resolution is markedly degraded by body habitus. Musculoskeletal: Degenerative changes in the spine. No worrisome lytic or sclerotic lesions. IMPRESSION: 1. Diagnostic quality is markedly  degraded by body habitus. 2. New omental/peritoneal metastases. 3. Cirrhosis with portal hypertension. Electronically Signed   By: MLorin PicketM.D.   On: 09/08/2020 13:05  CT BIOPSY  Result Date: 09/21/2020 INDICATION: 50 year old history of uterine cancer. Patient has suspicious omental/peritoneal disease. Tissue diagnosis is needed. EXAM: Image guided biopsy of peritoneal tissue in the anterior abdomen MEDICATIONS: Moderate sedation ANESTHESIA/SEDATION: Moderate (conscious) sedation was employed during this procedure. A total of Versed 2.0 mg and Fentanyl 100 mcg was administered intravenously. Moderate Sedation Time: 40 minutes. The patient's level of consciousness and vital signs were monitored continuously by radiology nursing throughout the procedure under my direct supervision. FLUOROSCOPY TIME:  None COMPLICATIONS: None immediate. PROCEDURE: Informed written consent was obtained from the patient after a thorough discussion of the procedural risks, benefits and alternatives. All questions were addressed. A timeout was performed prior to the initiation of the procedure. Patient was placed supine on the CT scanner. CT images through the abdomen were obtained. The abdomen was also evaluated with ultrasound. Soft tissue thickening in the lower anterior abdominal cavity was identified with CT and ultrasound. Procedure was technically difficult due to body habitus. The skin was prepped with chlorhexidine and sterile field was created. Skin and soft tissues were anesthetized using 1% lidocaine. Using ultrasound guidance, an 17 gauge coaxial needle was directed towards the anterior abdominal peritoneal thickening. Needle position was confirmed using CT. Needle was advanced into the soft tissues and core biopsies obtained with an 18 gauge core device using ultrasound guidance. Specimens placed in formalin. Bandage placed over the puncture site. FINDINGS: Thickening in anterior abdominal cavity. Ultrasound  demonstrated hypoechoic material in this area and findings are suggestive for abnormal omentum or peritoneal disease. Needle position was confirmed within the abnormal soft tissue using image guidance. Core specimens placed in formalin. IMPRESSION: Image guided core biopsies of the abnormal peritoneal thickening in the anterior abdominal cavity. Electronically Signed   By: Markus Daft M.D.   On: 09/21/2020 15:29     Assessment and plan- Patient is a 50 y.o. female who presents to [facility] for initial meeting in preparation for starting chemotherapy for the treatment of endometrial cancer.   Chemo Care Clinic/High Risk for ER/Hospitalization during chemotherapy- We discussed the role of the chemo care clinic and identified patient specific risk factors. I discussed that patient was identified as high risk primarily based on:  Patient has past medical history positive for: Past Medical History:  Diagnosis Date   Anemia    Angio-edema    Anxiety    Arthritis    Asthma    Depression    Diabetes mellitus without complication (HCC)    Hypertension    Hypothyroidism    Pre-diabetes    Right foot drop    Sleep apnea    Thyroid disease    Urticaria    Uterine cancer ([place])     Patient has past surgical history positive for: Past Surgical History:  Procedure Laterality Date   ABDOMINAL HYSTERECTOMY     CARPAL TUNNEL RELEASE     CESAREAN SECTION     CESAREAN SECTION N/A    Phreesia 03/14/2020   CHOLECYSTECTOMY     cyst removal     uterus   DILATION AND CURETTAGE OF UTERUS     still birth   DILATION AND CURETTAGE OF UTERUS N/A 06/11/2016   Procedure: DILATATION AND CURETTAGE;  Surgeon: Emily Filbert, MD;  Location: [facility];  Service: Gynecology;  Laterality: N/A;   ROBOTIC ASSISTED TOTAL HYSTERECTOMY WITH BILATERAL SALPINGO OOPHERECTOMY Bilateral 07/09/2016   Procedure: XI ROBOTIC ASSISTED TOTAL HYSTERECTOMY WITH BILATERAL SALPINGO OOPHORECTOMY FOR UTERUS GREATER THAN 250 GRAMS,  LYSIS  OF ADHESIONS;  Surgeon: Everitt Amber, MD;  Location: [facility];  Service: Gynecology;  Laterality: Bilateral;   SINOSCOPY     SINUS EXPLORATION     WISDOM TOOTH EXTRACTION     The patient is a with newly diagnosed.  Patient presents to clinic today for chemotherapy education and palliative care consult.  We will start.  We will send in prescriptions for prochlorperazine and ondansetron.  The patient verbalizes understanding of and agreement to the plan as discussed today.  Provided general information including the following: Paclitaxel and Carboplatin IV every 3 weeks  Obtained signed consent from patient.  Discussed symptoms including 1.  Low blood counts including red blood cells, white blood cells and platelets. 2. Infection including to avoid large crowds, wash hands frequently, and stay away from people who were sick.  If fever develops of 100.4 or higher, call our office. 3.  Mucositis-given instructions on mouth rinse (baking soda and salt mixture).  Keep mouth clean.  Use soft bristle toothbrush.  If mouth sores develop, call our clinic. 4.  Nausea/vomiting-gave prescriptions for ondansetron 4 mg every 4 hours as needed for nausea, may take around the clock if persistent.  Compazine 10 mg every 6 hours, may take around the clock if persistent. 5.  Diarrhea-use over-the-counter Imodium.  Call clinic if not controlled. 6.  Constipation-use senna, 1 to 2 tablets twice a day.  If no BM in 2 to 3 days call the clinic. 7.  Loss of appetite-try to eat small meals every 2-3 hours.  Call clinic if not eating. 8.  Taste changes-zinc 500 mg daily.  If becomes severe call clinic. 9.  Alcoholic beverages. 10.  Drink 2 to 3 quarts of water per day. 11.  Peripheral neuropathy-patient to call if numbness or tingling in hands or feet is persistent  Neulasta-will be given 24 to 48 hours after chemotherapy.  Gave information sheet on bone and joint pain.  Use Claritin or Pepcid.  May use ibuprofen or  Aleve.  Call if symptoms persist or are unbearable.  Answered questions to patient satisfaction.  Patient is to call with any further questions or concerns.   We discussed that social determinants of health may have significant impacts on health and outcomes for cancer patients.  Today we discussed specific social determinants of performance status, alcohol use, depression, financial needs, food insecurity, housing, interpersonal violence, social connections, stress, tobacco use, and transportation.    After lengthy discussion the following were identified as areas of need:   Outpatient services: We discussed options including home based and outpatient services, DME and care program. We discusssed that patients who participate in regular physical activity report fewer negative impacts of cancer and treatments and report less fatigue.   Financial Concerns: We discussed that living with cancer can create tremendous financial burden.  We discussed options for assistance. I asked that if assistance is needed in affording medications or paying bills to please let us know so that we can provide assistance. We discussed options for food including social services, [employer] ($50 every 2 weeks) and onsite food pantry.  We will also notify [facility] to see if cancer center can provide additional support.  Referral to Social work: Introduced Education officer, museum Elease Etienne and the services he can provide such as support with utility bill, cell phone and gas vouchers.   Support groups: We discussed options for support groups at the cancer center. If interested, please notify nurse navigator to enroll.  We discussed options for managing stress including healthy eating, exercise as well as participating in no charge counseling services at the cancer center and support groups.  If these are of interest, patient can notify either myself or primary nursing team.We discussed options for management including  medications and referral to quit Smart program  Transportation: We discussed options for transportation including [employer], paratransit, bus routes, [employer] transit, taxi/uber/lyft, and cancer center van.  I have notified primary oncology team who will help assist with arranging Lucianne Lei transportation for appointments when/if needed. We also discussed options for transportation on short notice/acute visits.  Palliative care services: We have palliative care services available in the cancer center to discuss goals of care and advanced care planning.  Please let us know if you have any questions or would like to speak to our palliative nurse practitioner.  Symptom Management Clinic: We discussed our symptom management clinic which is available for acute concerns while receiving treatment such as nausea, vomiting or diarrhea.  We can be reached via telephone at [phone] or through my chart.  We are available for virtual or in person visits on the same day from 830 to 4 PM Monday through Friday. She denies needing specific assistance at this time and She will be followed by Dr. Hinton Rao clinical team.  Plan: Discussed symptom management clinic. Discussed palliative care services. Discussed resources that are available here at the cancer center. Discussed medications and new prescriptions to begin treatment such as anti-nausea or steroids.   Disposition: RTC on   Visit Diagnosis No diagnosis found.  Patient expressed understanding and was in agreement with this plan. She also understands that She can call clinic at any time with any questions, concerns, or complaints.   I provided 30 minutes of  face to face conversation  during this encounter, and > 50% was spent counseling as documented under my assessment & plan.   Dayton Scrape, FNP- [facility]

## 2020-10-04 ENCOUNTER — Ambulatory Visit: Payer: Medicaid Other

## 2020-10-04 ENCOUNTER — Other Ambulatory Visit: Payer: Self-pay | Admitting: Hematology and Oncology

## 2020-10-04 DIAGNOSIS — C541 Malignant neoplasm of endometrium: Secondary | ICD-10-CM

## 2020-10-04 MED ORDER — ONDANSETRON HCL 4 MG PO TABS: 4.0000 mg | ORAL_TABLET | ORAL | 3 refills | Status: AC | PRN

## 2020-10-04 MED ORDER — DEXAMETHASONE 4 MG PO TABS: ORAL_TABLET | ORAL | 0 refills | Status: AC

## 2020-10-04 MED ORDER — PROCHLORPERAZINE MALEATE 10 MG PO TABS: 10.0000 mg | ORAL_TABLET | Freq: Four times a day (QID) | ORAL | 3 refills | Status: AC | PRN

## 2020-10-09 ENCOUNTER — Encounter: Payer: Self-pay | Admitting: Oncology

## 2020-10-09 ENCOUNTER — Ambulatory Visit: Payer: Medicaid Other

## 2020-10-10 ENCOUNTER — Telehealth: Payer: Self-pay | Admitting: Oncology

## 2020-10-11 ENCOUNTER — Inpatient Hospital Stay: Payer: Medicaid Other

## 2020-10-11 ENCOUNTER — Ambulatory Visit: Payer: Medicaid Other | Admitting: Allergy and Immunology

## 2020-10-11 ENCOUNTER — Other Ambulatory Visit: Payer: Self-pay | Admitting: Oncology

## 2020-10-11 ENCOUNTER — Other Ambulatory Visit: Payer: Self-pay | Admitting: Hematology and Oncology

## 2020-10-11 ENCOUNTER — Encounter: Payer: Self-pay | Admitting: Oncology

## 2020-10-11 ENCOUNTER — Ambulatory Visit: Payer: Medicaid Other | Admitting: Gynecologic Oncology

## 2020-10-11 DIAGNOSIS — C541 Malignant neoplasm of endometrium: Secondary | ICD-10-CM

## 2020-10-11 LAB — HEPATIC FUNCTION PANEL
ALT: 22 (ref 7–35)
AST: 55 — AB (ref 13–35)
Alkaline Phosphatase: 137 — AB (ref 25–125)
Bilirubin, Total: 1.6

## 2020-10-11 LAB — CBC AND DIFFERENTIAL
HCT: 32 — AB (ref 36–46)
Hemoglobin: 10.5 — AB (ref 12.0–16.0)
Neutrophils Absolute: 2.05
Platelets: 90 — AB (ref 150–399)
WBC: 3.2

## 2020-10-11 LAB — BASIC METABOLIC PANEL
BUN: 8 (ref 4–21)
CO2: 23 — AB (ref 13–22)
Chloride: 108 (ref 99–108)
Creatinine: 0.6 (ref 0.5–1.1)
Glucose: 112
Potassium: 4.3 (ref 3.4–5.3)
Sodium: 140 (ref 137–147)

## 2020-10-11 LAB — COMPREHENSIVE METABOLIC PANEL
Albumin: 3.5 (ref 3.5–5.0)
Calcium: 8.4 — AB (ref 8.7–10.7)

## 2020-10-11 LAB — CBC: RBC: 4.02 (ref 3.87–5.11)

## 2020-10-12 ENCOUNTER — Other Ambulatory Visit: Payer: Self-pay

## 2020-10-12 ENCOUNTER — Inpatient Hospital Stay: Payer: Medicaid Other | Attending: Hematology and Oncology

## 2020-10-12 ENCOUNTER — Encounter: Payer: Self-pay | Admitting: Oncology

## 2020-10-12 VITALS — BP 142/62 | HR 93 | Temp 97.9°F | Resp 18 | Ht 67.5 in | Wt >= 6400 oz

## 2020-10-12 DIAGNOSIS — D701 Agranulocytosis secondary to cancer chemotherapy: Secondary | ICD-10-CM | POA: Insufficient documentation

## 2020-10-12 DIAGNOSIS — Z90710 Acquired absence of both cervix and uterus: Secondary | ICD-10-CM | POA: Insufficient documentation

## 2020-10-12 DIAGNOSIS — R5081 Fever presenting with conditions classified elsewhere: Secondary | ICD-10-CM | POA: Diagnosis not present

## 2020-10-12 DIAGNOSIS — T451X5A Adverse effect of antineoplastic and immunosuppressive drugs, initial encounter: Secondary | ICD-10-CM | POA: Diagnosis not present

## 2020-10-12 DIAGNOSIS — Z8542 Personal history of malignant neoplasm of other parts of uterus: Secondary | ICD-10-CM | POA: Insufficient documentation

## 2020-10-12 DIAGNOSIS — Z5111 Encounter for antineoplastic chemotherapy: Secondary | ICD-10-CM | POA: Diagnosis not present

## 2020-10-12 DIAGNOSIS — Z79899 Other long term (current) drug therapy: Secondary | ICD-10-CM | POA: Diagnosis not present

## 2020-10-12 DIAGNOSIS — D508 Other iron deficiency anemias: Secondary | ICD-10-CM | POA: Insufficient documentation

## 2020-10-12 DIAGNOSIS — C541 Malignant neoplasm of endometrium: Secondary | ICD-10-CM | POA: Insufficient documentation

## 2020-10-12 DIAGNOSIS — Z5189 Encounter for other specified aftercare: Secondary | ICD-10-CM | POA: Diagnosis not present

## 2020-10-12 DIAGNOSIS — C786 Secondary malignant neoplasm of retroperitoneum and peritoneum: Secondary | ICD-10-CM | POA: Insufficient documentation

## 2020-10-12 MED ORDER — SODIUM CHLORIDE 0.9 % IV SOLN
900.0000 mg | Freq: Once | INTRAVENOUS | Status: AC
  Administered 2020-10-12: 900 mg via INTRAVENOUS
  Filled 2020-10-12: qty 90

## 2020-10-12 MED ORDER — SODIUM CHLORIDE 0.9 % IV SOLN
10.0000 mg | Freq: Once | INTRAVENOUS | Status: AC
  Administered 2020-10-12: 10 mg via INTRAVENOUS
  Filled 2020-10-12: qty 10

## 2020-10-12 MED ORDER — SODIUM CHLORIDE 0.9% FLUSH
10.0000 mL | INTRAVENOUS | Status: DC | PRN
  Administered 2020-10-12: 10 mL

## 2020-10-12 MED ORDER — SODIUM CHLORIDE 0.9 % IV SOLN
135.1000 mg/m2 | Freq: Once | INTRAVENOUS | Status: AC
  Administered 2020-10-12: 402 mg via INTRAVENOUS
  Filled 2020-10-12: qty 67

## 2020-10-12 MED ORDER — DIPHENHYDRAMINE HCL 50 MG/ML IJ SOLN
50.0000 mg | Freq: Once | INTRAMUSCULAR | Status: AC
  Administered 2020-10-12: 50 mg via INTRAVENOUS
  Filled 2020-10-12: qty 1

## 2020-10-12 MED ORDER — FAMOTIDINE 20 MG IN NS 100 ML IVPB
20.0000 mg | Freq: Once | INTRAVENOUS | Status: AC
  Administered 2020-10-12: 20 mg via INTRAVENOUS
  Filled 2020-10-12: qty 20

## 2020-10-12 MED ORDER — SODIUM CHLORIDE 0.9 % IV SOLN
150.0000 mg | Freq: Once | INTRAVENOUS | Status: AC
  Administered 2020-10-12: 150 mg via INTRAVENOUS
  Filled 2020-10-12: qty 150

## 2020-10-12 MED ORDER — PALONOSETRON HCL INJECTION 0.25 MG/5ML
0.2500 mg | Freq: Once | INTRAVENOUS | Status: AC
  Administered 2020-10-12: 0.25 mg via INTRAVENOUS
  Filled 2020-10-12: qty 5

## 2020-10-12 MED ORDER — HEPARIN SOD (PORK) LOCK FLUSH 100 UNIT/ML IV SOLN
500.0000 [IU] | Freq: Once | INTRAVENOUS | Status: AC | PRN
  Administered 2020-10-12: 500 [IU]

## 2020-10-12 MED ORDER — SODIUM CHLORIDE 0.9 % IV SOLN: Freq: Once | INTRAVENOUS | Status: AC

## 2020-10-12 MED FILL — Ferumoxytol Inj 510 MG/17ML (30 MG/ML) (Elemental Fe): INTRAVENOUS | Qty: 17 | Status: AC

## 2020-10-12 NOTE — Patient Instructions (Signed)
[facility]  Discharge Instructions: Thank you for choosing [facility] to provide your oncology and hematology care.  If you have a lab appointment with the Cancer Center, please go directly to the Cancer Center and check in at the registration area.   Wear comfortable clothing and clothing appropriate for easy access to any Portacath or PICC line.   We strive to give you quality time with your provider. You may need to reschedule your appointment if you arrive late (15 or more minutes).  Arriving late affects you and other patients whose appointments are after yours.  Also, if you miss three or more appointments without notifying the office, you may be dismissed from the clinic at the provider's discretion.      For prescription refill requests, have your pharmacy contact our office and allow 72 hours for refills to be completed.    Today you received the following chemotherapy and/or immunotherapy agents taxol/carbo     To help prevent nausea and vomiting after your treatment, we encourage you to take your nausea medication as directed.  BELOW ARE SYMPTOMS THAT SHOULD BE REPORTED IMMEDIATELY: *FEVER GREATER THAN 100.4 F (38 C) OR HIGHER *CHILLS OR SWEATING *NAUSEA AND VOMITING THAT IS NOT CONTROLLED WITH YOUR NAUSEA MEDICATION *UNUSUAL SHORTNESS OF BREATH *UNUSUAL BRUISING OR BLEEDING *URINARY PROBLEMS (pain or burning when urinating, or frequent urination) *BOWEL PROBLEMS (unusual diarrhea, constipation, pain near the anus) TENDERNESS IN MOUTH AND THROAT WITH OR WITHOUT PRESENCE OF ULCERS (sore throat, sores in mouth, or a toothache) UNUSUAL RASH, SWELLING OR PAIN  UNUSUAL VAGINAL DISCHARGE OR ITCHING   Items with * indicate a potential emergency and should be followed up as soon as possible or go to the Emergency Department if any problems should occur.  Please show the CHEMOTHERAPY ALERT CARD or IMMUNOTHERAPY ALERT CARD at check-in to the  Emergency Department and triage nurse.  Should you have questions after your visit or need to cancel or reschedule your appointment, please contact [facility]  Dept: [phone]  and follow the prompts.  Office hours are 8:00 a.m. to 4:30 p.m. Monday - Friday. Please note that voicemails left after 4:00 p.m. may not be returned until the following business day.  We are closed weekends and major holidays. You have access to a nurse at all times for urgent questions. Please call the main number to the clinic Dept: [phone] and follow the prompts.  For any non-urgent questions, you may also contact your provider using MyChart. We now offer e-Visits for anyone 18 and older to request care online for non-urgent symptoms. For details visit [email or web address].   Also download the MyChart app! Go to the app store, search "MyChart", open the app, select [facility], and log in with your MyChart username and password.  Due to Covid, a mask is required upon entering the hospital/clinic. If you do not have a mask, one will be given to you upon arrival. For doctor visits, patients may have 1 support person aged 18 or older with them. For treatment visits, patients cannot have anyone with them due to current Covid guidelines and our immunocompromised population.   Carboplatin injection What is this medication? CARBOPLATIN (KAR boe pla tin) is a chemotherapy drug. It targets fast dividing cells, like cancer cells, and causes these cells to die. This medicine is usedto treat ovarian cancer and many other cancers. This medicine may be used for other purposes; ask your health care   provider orpharmacist if you have questions. COMMON BRAND NAME(S): Paraplatin What should I tell my care team before I take this medication? They need to know if you have any of these conditions: blood disorders hearing problems kidney disease recent or ongoing radiation therapy an unusual or allergic  reaction to carboplatin, cisplatin, other chemotherapy, other medicines, foods, dyes, or preservatives pregnant or trying to get pregnant breast-feeding How should I use this medication? This drug is usually given as an infusion into a vein. It is administered in ahospital or clinic by a specially trained health care professional. Talk to your pediatrician regarding the use of this medicine in children.Special care may be needed. Overdosage: If you think you have taken too much of this medicine contact apoison control center or emergency room at once. NOTE: This medicine is only for you. Do not share this medicine with others. What if I miss a dose? It is important not to miss a dose. Call your doctor or health careprofessional if you are unable to keep an appointment. What may interact with this medication? medicines for seizures medicines to increase blood counts like filgrastim, pegfilgrastim, sargramostim some antibiotics like amikacin, gentamicin, neomycin, streptomycin, tobramycin vaccines Talk to your doctor or health care professional before taking any of thesemedicines: acetaminophen aspirin ibuprofen ketoprofen naproxen This list may not describe all possible interactions. Give your health care provider a list of all the medicines, herbs, non-prescription drugs, or dietary supplements you use. Also tell them if you smoke, drink alcohol, or use illegaldrugs. Some items may interact with your medicine. What should I watch for while using this medication? Your condition will be monitored carefully while you are receiving this medicine. You will need important blood work done while you are taking thismedicine. This drug may make you feel generally unwell. This is not uncommon, as chemotherapy can affect healthy cells as well as cancer cells. Report any side effects. Continue your course of treatment even though you feel ill unless yourdoctor tells you to stop. In some cases, you may be  given additional medicines to help with side effects.Follow all directions for their use. Call your doctor or health care professional for advice if you get a fever, chills or sore throat, or other symptoms of a cold or flu. Do not treat yourself. This drug decreases your body's ability to fight infections. Try toavoid being around people who are sick. This medicine may increase your risk to bruise or bleed. Call your doctor orhealth care professional if you notice any unusual bleeding. Be careful brushing and flossing your teeth or using a toothpick because you may get an infection or bleed more easily. If you have any dental work done,tell your dentist you are receiving this medicine. Avoid taking products that contain aspirin, acetaminophen, ibuprofen, naproxen, or ketoprofen unless instructed by your doctor. These medicines may hide afever. Do not become pregnant while taking this medicine. Women should inform their doctor if they wish to become pregnant or think they might be pregnant. There is a potential for serious side effects to an unborn child. Talk to your health care professional or pharmacist for more information. Do not breast-feed aninfant while taking this medicine. What side effects may I notice from receiving this medication? Side effects that you should report to your doctor or health care professionalas soon as possible: allergic reactions like skin rash, itching or hives, swelling of the face, lips, or tongue signs of infection - fever or chills, cough, sore throat, pain or difficulty passing   urine signs of decreased platelets or bleeding - bruising, pinpoint red spots on the skin, black, tarry stools, nosebleeds signs of decreased red blood cells - unusually weak or tired, fainting spells, lightheadedness breathing problems changes in hearing changes in vision chest pain high blood pressure low blood counts - This drug may decrease the number of white blood cells, red blood  cells and platelets. You may be at increased risk for infections and bleeding. nausea and vomiting pain, swelling, redness or irritation at the injection site pain, tingling, numbness in the hands or feet problems with balance, talking, walking trouble passing urine or change in the amount of urine Side effects that usually do not require medical attention (report to yourdoctor or health care professional if they continue or are bothersome): hair loss loss of appetite metallic taste in the mouth or changes in taste This list may not describe all possible side effects. Call your doctor for medical advice about side effects. You may report side effects to FDA at1-800-FDA-1088. Where should I keep my medication? This drug is given in a hospital or clinic and will not be stored at home. NOTE: This sheet is a summary. It may not cover all possible information. If you have questions about this medicine, talk to your doctor, pharmacist, orhealth care provider.  2022 Elsevier/Gold Standard (2007-05-05 14:38:05) Paclitaxel injection What is this medication? PACLITAXEL (PAK li TAX el) is a chemotherapy drug. It targets fast dividing cells, like cancer cells, and causes these cells to die. This medicine is used to treat ovarian cancer, breast cancer, lung cancer, Kaposi's sarcoma, andother cancers. This medicine may be used for other purposes; ask your health care provider orpharmacist if you have questions. COMMON BRAND NAME(S): Onxol, Taxol What should I tell my care team before I take this medication? They need to know if you have any of these conditions: history of irregular heartbeat liver disease low blood counts, like low white cell, platelet, or red cell counts lung or breathing disease, like asthma tingling of the fingers or toes, or other nerve disorder an unusual or allergic reaction to paclitaxel, alcohol, polyoxyethylated castor oil, other chemotherapy, other medicines, foods, dyes, or  preservatives pregnant or trying to get pregnant breast-feeding How should I use this medication? This drug is given as an infusion into a vein. It is administered in a hospitalor clinic by a specially trained health care professional. Talk to your pediatrician regarding the use of this medicine in children.Special care may be needed. Overdosage: If you think you have taken too much of this medicine contact apoison control center or emergency room at once. NOTE: This medicine is only for you. Do not share this medicine with others. What if I miss a dose? It is important not to miss your dose. Call your doctor or health careprofessional if you are unable to keep an appointment. What may interact with this medication? Do not take this medicine with any of the following medications: live virus vaccines This medicine may also interact with the following medications: antiviral medicines for hepatitis, HIV or AIDS certain antibiotics like erythromycin and clarithromycin certain medicines for fungal infections like ketoconazole and itraconazole certain medicines for seizures like carbamazepine, phenobarbital, phenytoin gemfibrozil nefazodone rifampin St. John's wort This list may not describe all possible interactions. Give your health care provider a list of all the medicines, herbs, non-prescription drugs, or dietary supplements you use. Also tell them if you smoke, drink alcohol, or use illegaldrugs. Some items may interact with your medicine.   What should I watch for while using this medication? Your condition will be monitored carefully while you are receiving this medicine. You will need important blood work done while you are taking thismedicine. This medicine can cause serious allergic reactions. To reduce your risk you will need to take other medicine(s) before treatment with this medicine. If you experience allergic reactions like skin rash, itching or hives, swelling of theface, lips, or  tongue, tell your doctor or health care professional right away. In some cases, you may be given additional medicines to help with side effects.Follow all directions for their use. This drug may make you feel generally unwell. This is not uncommon, as chemotherapy can affect healthy cells as well as cancer cells. Report any side effects. Continue your course of treatment even though you feel ill unless yourdoctor tells you to stop. Call your doctor or health care professional for advice if you get a fever, chills or sore throat, or other symptoms of a cold or flu. Do not treat yourself. This drug decreases your body's ability to fight infections. Try toavoid being around people who are sick. This medicine may increase your risk to bruise or bleed. Call your doctor orhealth care professional if you notice any unusual bleeding. Be careful brushing and flossing your teeth or using a toothpick because you may get an infection or bleed more easily. If you have any dental work done,tell your dentist you are receiving this medicine. Avoid taking products that contain aspirin, acetaminophen, ibuprofen, naproxen, or ketoprofen unless instructed by your doctor. These medicines may hide afever. Do not become pregnant while taking this medicine. Women should inform their doctor if they wish to become pregnant or think they might be pregnant. There is a potential for serious side effects to an unborn child. Talk to your health care professional or pharmacist for more information. Do not breast-feed aninfant while taking this medicine. Men are advised not to father a child while receiving this medicine. This product may contain alcohol. Ask your pharmacist or healthcare provider if this medicine contains alcohol. Be sure to tell all healthcare providers you are taking this medicine. Certain medicines, like metronidazole and disulfiram, can cause an unpleasant reaction when taken with alcohol. The reaction includes  flushing, headache, nausea, vomiting, sweating, and increased thirst. Thereaction can last from 30 minutes to several hours. What side effects may I notice from receiving this medication? Side effects that you should report to your doctor or health care professionalas soon as possible: allergic reactions like skin rash, itching or hives, swelling of the face, lips, or tongue breathing problems changes in vision fast, irregular heartbeat high or low blood pressure mouth sores pain, tingling, numbness in the hands or feet signs of decreased platelets or bleeding - bruising, pinpoint red spots on the skin, black, tarry stools, blood in the urine signs of decreased red blood cells - unusually weak or tired, feeling faint or lightheaded, falls signs of infection - fever or chills, cough, sore throat, pain or difficulty passing urine signs and symptoms of liver injury like dark yellow or brown urine; general ill feeling or flu-like symptoms; light-colored stools; loss of appetite; nausea; right upper belly pain; unusually weak or tired; yellowing of the eyes or skin swelling of the ankles, feet, hands unusually slow heartbeat Side effects that usually do not require medical attention (report to yourdoctor or health care professional if they continue or are bothersome): diarrhea hair loss loss of appetite muscle or joint pain nausea, vomiting pain,   redness, or irritation at site where injected tiredness This list may not describe all possible side effects. Call your doctor for medical advice about side effects. You may report side effects to FDA at1-800-FDA-1088. Where should I keep my medication? This drug is given in a hospital or clinic and will not be stored at home. NOTE: This sheet is a summary. It may not cover all possible information. If you have questions about this medicine, talk to your doctor, pharmacist, orhealth care provider.  2022 Elsevier/Gold Standard (2018-12-30  13:37:23)  

## 2020-10-12 NOTE — Progress Notes (Signed)
..  Pharmacist Chemotherapy Monitoring - Initial Assessment    Anticipated start date: 10/12/20  The following has been reviewed per standard work regarding the patient's treatment regimen: The patient's diagnosis, treatment plan and drug doses, and organ/hematologic function Lab orders and baseline tests specific to treatment regimen  The treatment plan start date, drug sequencing, and pre-medications Prior authorization status  Patient's documented medication list, including drug-drug interaction screen and prescriptions for anti-emetics and supportive care specific to the treatment regimen The drug concentrations, fluid compatibility, administration routes, and timing of the medications to be used The patient's access for treatment and lifetime cumulative dose history, if applicable  The patient's medication allergies and previous infusion related reactions, if applicable   Changes made to treatment plan:  treatment plan date and drug offset times  Follow up needed:  N/A   Juanetta Beets, [facility], 10/12/2020  11:09 AM

## 2020-10-13 ENCOUNTER — Inpatient Hospital Stay: Payer: Medicaid Other

## 2020-10-13 VITALS — BP 118/50 | HR 90 | Temp 97.8°F | Resp 18 | Wt >= 6400 oz

## 2020-10-13 DIAGNOSIS — Z5111 Encounter for antineoplastic chemotherapy: Secondary | ICD-10-CM | POA: Diagnosis not present

## 2020-10-13 DIAGNOSIS — D508 Other iron deficiency anemias: Secondary | ICD-10-CM

## 2020-10-13 DIAGNOSIS — C541 Malignant neoplasm of endometrium: Secondary | ICD-10-CM

## 2020-10-13 MED ORDER — SODIUM CHLORIDE 0.9 % IV SOLN
Freq: Once | INTRAVENOUS | Status: AC
Start: 2020-10-13 — End: 2020-10-13

## 2020-10-13 MED ORDER — SODIUM CHLORIDE 0.9 % IV SOLN
510.0000 mg | Freq: Once | INTRAVENOUS | Status: AC
  Administered 2020-10-13: 510 mg via INTRAVENOUS
  Filled 2020-10-13: qty 17

## 2020-10-13 MED ORDER — SODIUM CHLORIDE 0.9% FLUSH
10.0000 mL | Freq: Once | INTRAVENOUS | Status: AC | PRN
  Administered 2020-10-13: 10 mL

## 2020-10-13 MED ORDER — PEGFILGRASTIM-CBQV 6 MG/0.6ML ~~LOC~~ SOSY
6.0000 mg | PREFILLED_SYRINGE | Freq: Once | SUBCUTANEOUS | Status: AC
  Administered 2020-10-13: 6 mg via SUBCUTANEOUS
  Filled 2020-10-13: qty 0.6

## 2020-10-13 MED ORDER — HEPARIN SOD (PORK) LOCK FLUSH 100 UNIT/ML IV SOLN
500.0000 [IU] | Freq: Once | INTRAVENOUS | Status: AC | PRN
Start: 2020-10-13 — End: 2020-10-13
  Administered 2020-10-13: 500 [IU]

## 2020-10-13 NOTE — Patient Instructions (Addendum)
Ferumoxytol Injection What is this medication? FERUMOXYTOL (FER ue MOX i tol) treats low levels of iron in your body (iron deficiency anemia). Iron is a mineral that plays an important role in making red blood cells, which carry oxygen from your lungs to the rest of your body. This medicine may be used for other purposes; ask your health care provider or pharmacist if you have questions. COMMON BRAND NAME(S): Feraheme What should I tell my care team before I take this medication? They need to know if you have any of these conditions: Anemia not caused by low iron levels High levels of iron in the blood Magnetic resonance imaging (MRI) test scheduled An unusual or allergic reaction to iron, other medications, foods, dyes, or preservatives Pregnant or trying to get pregnant Breast-feeding How should I use this medication? This medication is for injection into a vein. It is given in a hospital or clinic setting. Talk to your care team the use of this medication in children. Special care may be needed. Overdosage: If you think you have taken too much of this medicine contact a poison control center or emergency room at once. NOTE: This medicine is only for you. Do not share this medicine with others. What if I miss a dose? It is important not to miss your dose. Call your care team if you are unable to keep an appointment. What may interact with this medication? Other iron products This list may not describe all possible interactions. Give your health care provider a list of all the medicines, herbs, non-prescription drugs, or dietary supplements you use. Also tell them if you smoke, drink alcohol, or use illegal drugs. Some items may interact with your medicine. What should I watch for while using this medication? Visit your care team regularly. Tell your care team if your symptoms do not start to get better or if they get worse. You may need blood work done while you are taking this  medication. You may need to follow a special diet. Talk to your care team. Foods that contain iron include: whole grains/cereals, dried fruits, beans, or peas, leafy green vegetables, and organ meats (liver, kidney). What side effects may I notice from receiving this medication? Side effects that you should report to your care team as soon as possible: Allergic reactions-skin rash, itching, hives, swelling of the face, lips, tongue, or throat Low blood pressure-dizziness, feeling faint or lightheaded, blurry vision Shortness of breath Side effects that usually do not require medical attention (report to your care team if they continue or are bothersome): Flushing Headache Joint pain Muscle pain Nausea Pain, redness, or irritation at injection site This list may not describe all possible side effects. Call your doctor for medical advice about side effects. You may report side effects to FDA at 1-800-FDA-1088. Where should I keep my medication? This medication is given in a hospital or clinic and will not be stored at home. NOTE: This sheet is a summary. It may not cover all possible information. If you have questions about this medicine, talk to your doctor, pharmacist, or health care provider.  2022 Elsevier/Gold Standard (2020-06-16 15:35:12) Pegfilgrastim injection What is this medication? PEGFILGRASTIM (PEG fil gra stim) is a long-acting granulocyte colony-stimulating factor that stimulates the growth of neutrophils, a type of white blood cell important in the body's fight against infection. It is used to reduce the incidence of fever and infection in patients with certain types of cancer who are receiving chemotherapy that affects the bone marrow,  and to increase survival after being exposed to high doses of radiation. This medicine may be used for other purposes; ask your health care provider or pharmacist if you have questions. COMMON BRAND NAME(S): Rexene Edison, Ziextenzo What should I tell my care team before I take this medication? They need to know if you have any of these conditions: kidney disease latex allergy ongoing radiation therapy sickle cell disease skin reactions to acrylic adhesives (On-Body Injector only) an unusual or allergic reaction to pegfilgrastim, filgrastim, other medicines, foods, dyes, or preservatives pregnant or trying to get pregnant breast-feeding How should I use this medication? This medicine is for injection under the skin. If you get this medicine at home, you will be taught how to prepare and give the pre-filled syringe or how to use the On-body Injector. Refer to the patient Instructions for Use for detailed instructions. Use exactly as directed. Tell your healthcare provider immediately if you suspect that the On-body Injector may not have performed as intended or if you suspect the use of the On-body Injector resulted in a missed or partial dose. It is important that you put your used needles and syringes in a special sharps container. Do not put them in a trash can. If you do not have a sharps container, call your pharmacist or healthcare provider to get one. Talk to your pediatrician regarding the use of this medicine in children. While this drug may be prescribed for selected conditions, precautions do apply. Overdosage: If you think you have taken too much of this medicine contact a poison control center or emergency room at once. NOTE: This medicine is only for you. Do not share this medicine with others. What if I miss a dose? It is important not to miss your dose. Call your doctor or health care professional if you miss your dose. If you miss a dose due to an On-body Injector failure or leakage, a new dose should be administered as soon as possible using a single prefilled syringe for manual use. What may interact with this medication? Interactions have not been studied. This list may not describe all  possible interactions. Give your health care provider a list of all the medicines, herbs, non-prescription drugs, or dietary supplements you use. Also tell them if you smoke, drink alcohol, or use illegal drugs. Some items may interact with your medicine. What should I watch for while using this medication? Your condition will be monitored carefully while you are receiving this medicine. You may need blood work done while you are taking this medicine. Talk to your health care provider about your risk of cancer. You may be more at risk for certain types of cancer if you take this medicine. If you are going to need a MRI, CT scan, or other procedure, tell your doctor that you are using this medicine (On-Body Injector only). What side effects may I notice from receiving this medication? Side effects that you should report to your doctor or health care professional as soon as possible: allergic reactions (skin rash, itching or hives, swelling of the face, lips, or tongue) back pain dizziness fever pain, redness, or irritation at site where injected pinpoint red spots on the skin red or dark-brown urine shortness of breath or breathing problems stomach or side pain, or pain at the shoulder swelling tiredness trouble passing urine or change in the amount of urine unusual bruising or bleeding Side effects that usually do not require medical attention (report to your doctor or  health care professional if they continue or are bothersome): bone pain muscle pain This list may not describe all possible side effects. Call your doctor for medical advice about side effects. You may report side effects to FDA at 1-800-FDA-1088. Where should I keep my medication? Keep out of the reach of children. If you are using this medicine at home, you will be instructed on how to store it. Throw away any unused medicine after the expiration date on the label. NOTE: This sheet is a summary. It may not cover all  possible information. If you have questions about this medicine, talk to your doctor, pharmacist, or health care provider.  2022 Elsevier/Gold Standard (2020-02-25 11:54:14)

## 2020-10-17 ENCOUNTER — Telehealth: Payer: Self-pay

## 2020-10-17 NOTE — Telephone Encounter (Addendum)
10/17/20 @ 1658 - Pt returned my call. She reports that she has had intermittent nausea, but controlled with nausea medication. She has been taking Clearlax for constipation (has constipation normally-before treatments), and over the weekend it was really bad and she had to take something else. She describes  the medication as chalky, but it got her bowels moving. I asked her to purchase some senna-s and take 2 tabs po BID, and increase up to 4 tabs po BID if needed. She can still use the Clearlax as needed. Pt went to emergency room on Sunday, because she had so much pain in her knees down to her calves. She reports that the emergency room doctors told her it was most likely from the Neulasta injection. They gave her a dose of dilaudid while there. She had been taking the Claritin as directed. She is alternating Advil with oxycodone '5mg'$  q 8hr PRN for the knee/calf pain. (The OxyIR '5mg'$  were from surgeons office). No rashes. No fevers. "I cant eat because of the way things smell and taste". I encouraged her to use plastic utensils. Avoid meats. I told her to try brushing her teeth/performing oral care more often. She could try using sugarless candies to cleanse her palate.Avoiding large meals. She may have to be outside when family cooking.  I reminded pt of the importance of calling us with temp of 100.4 or higher, DAY OR NIGHT. She verbalized understanding.

## 2020-10-18 ENCOUNTER — Telehealth: Payer: Self-pay

## 2020-10-18 NOTE — Telephone Encounter (Addendum)
10/18/20 @ 1530- Dr Bobby Rumpf notified of below.  10/18/20 @ [facility] like they are admitting. Her urine looks bad.   ----- Message from Melodye Ped, NP sent at 10/18/2020  9:10 AM EDT ----- Regarding: RE: Chemotherapy f/u call - side effects; seen in ED with knee/calf/leg pain on Sunday Contact: [phone] I'll check. She is in ED now, and is neutropenic. Her bilirubin is elevated at 3.0. I'll keep an eye on her and let you know  ----- Message ----- From: Dairl Ponder, RN Sent: 10/18/2020   8:34 AM EDT To: Melodye Ped, NP Subject: RE: Chemotherapy f/u call - side effects; se#  She called call center last night with temp of 100.6-100.7. She was told to go to ED, haven't checked to see if she went. She denied fevers when I talked to her yesterday afternoon.   ----- Message ----- From: Melodye Ped, NP Sent: 10/18/2020   8:22 AM EDT To: Dairl Ponder, RN Subject: RE: Chemotherapy f/u call - side effects; se#  Ok. Sounds good. We can refill her pain med next time. Sounds like she is going to need it. ----- Message ----- From: Dairl Ponder, RN Sent: 10/17/2020   5:22 PM EDT To: Juanetta Beets, RPH, Melodye Ped, NP Subject: Chemotherapy f/u call - side effects; seen i#  She reports that she has had intermittent nausea, but controlled with nausea medication. She has been taking Clearlax for constipation (has constipation normally-before treatments), and over the weekend it was really bad and she had to take something else. She describes  the medication as chalky, but it got her bowels moving. I asked her to purchase some senna-s and take 2 tabs po BID, and increase up to 4 tabs po BID if needed. She can still use the Clearlax as needed. Pt went to emergency room on Sunday morning, because she had so much pain in her knees down to her calves. She reports that the emergency room doctors told her it was most likely from the Neulasta injection. They gave her a  dose of dilaudid while there & discharged her. She had been taking the Claritin as directed. She is alternating Advil with oxycodone '5mg'$  q 8hr PRN for the knee/calf pain. (The OxyIR '5mg'$  were from surgeons office). No rashes. No fevers.

## 2020-10-18 NOTE — Progress Notes (Signed)
10/13/20- 1430- chemo F/U- patient stated she was doing well after her treatment with no side effects at this time.

## 2020-10-19 ENCOUNTER — Encounter (HOSPITAL_COMMUNITY): Payer: Self-pay

## 2020-10-23 ENCOUNTER — Telehealth: Payer: Self-pay | Admitting: Genetic Counselor

## 2020-10-23 ENCOUNTER — Telehealth: Payer: Self-pay | Admitting: Family Medicine

## 2020-10-23 NOTE — Telephone Encounter (Signed)
Contacted patient in attempt to disclose results of genetic testing.  LVM with contact information requesting a call back.  

## 2020-10-23 NOTE — Telephone Encounter (Signed)
Janet [name] with [facility] is calling to see if dr Margarita Rana would sign off on verbal order for the patient to have IV abx. Pt has uti and will be discharge from [facility] today and they will be seeing the patinet tomorrow

## 2020-10-23 NOTE — Telephone Encounter (Signed)
This patient's last visit with me was over a year ago.  Orders need to be obtained from her most recent Physicians like her Oncologist.

## 2020-10-24 ENCOUNTER — Encounter (HOSPITAL_COMMUNITY): Payer: Self-pay

## 2020-10-24 NOTE — Telephone Encounter (Signed)
Janet [name] with [facility] called in to inquire again from Dr Margarita Rana about signing orders. Patient saw Janet [name] on 08/23/20. Please advise Call Janet [name] at Ph#  [phone]

## 2020-10-24 NOTE — Telephone Encounter (Signed)
Informed Dorian Pod of the message per Dr. Margarita Rana.

## 2020-10-25 ENCOUNTER — Telehealth: Payer: Self-pay

## 2020-10-25 NOTE — Telephone Encounter (Signed)
Dr Bobby Rumpf agreed to sign orders for home health, IV antibiotic infusion in the home and labs.

## 2020-10-30 ENCOUNTER — Ambulatory Visit: Payer: Medicaid Other | Admitting: Allergy and Immunology

## 2020-10-30 ENCOUNTER — Telehealth: Payer: Self-pay

## 2020-10-30 ENCOUNTER — Inpatient Hospital Stay: Payer: Medicaid Other | Admitting: Oncology

## 2020-10-30 ENCOUNTER — Inpatient Hospital Stay: Payer: Medicaid Other

## 2020-10-30 LAB — HEPATIC FUNCTION PANEL
ALT: 36 — AB (ref 7–35)
AST: 80 — AB (ref 13–35)
Alkaline Phosphatase: 122 (ref 25–125)
Bilirubin, Total: 1.7

## 2020-10-30 LAB — BASIC METABOLIC PANEL
BUN: 7 (ref 4–21)
CO2: 24 — AB (ref 13–22)
Chloride: 105 (ref 99–108)
Creatinine: 0.7 (ref 0.5–1.1)
Glucose: 124
Potassium: 3.8 (ref 3.4–5.3)
Sodium: 136 — AB (ref 137–147)

## 2020-10-30 LAB — CBC AND DIFFERENTIAL
HCT: 30 — AB (ref 36–46)
Hemoglobin: 9.6 — AB (ref 12.0–16.0)
Neutrophils Absolute: 6.23
Platelets: 106 — AB (ref 150–399)
WBC: 7.6

## 2020-10-30 LAB — CBC: RBC: 3.68 — AB (ref 3.87–5.11)

## 2020-10-30 LAB — COMPREHENSIVE METABOLIC PANEL
Albumin: 2.5 — AB (ref 3.5–5.0)
Calcium: 9.6 (ref 8.7–10.7)

## 2020-10-30 NOTE — Progress Notes (Signed)
[facility]  [address] [place],  [place]  [address] [phone]  Clinic Day:  10/31/2020  Referring physician: Charlott Rakes, MD  This document serves as a record of services personally performed by Dequincy Macarthur Critchley, MD. It was created on their behalf by Curry,Lauren E, a trained medical scribe. The creation of this record is based on the scribe's personal observations and the provider's statements to them.  HISTORY OF PRESENT ILLNESS:  The patient is a 50 y.o. female  with anemia and thrombocytopenia and most recently metastatic endometrial cancer.  She comes in today prior to her 2nd cycle of carboplatin/paclitaxel.  Unfortunately, despite receiving Neulasta, she was admitted for a neutropenic fever.   She ultimately ended up being treated for urosepsis, for which she is nearing the completion of her antibiotics.  Overall, she is doing better since being discharged.  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  She recalls having dropped items, which was never an issue before her chemotherapy commenced.  She is concerned about this problem getting worse over time.  As it pertains to her disease, she denies having abdominal pain or other symptoms which concern her for disease progression.    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  All prior scans up until recently had shown no evidence of disease recurrence.      PHYSICAL EXAM:  Blood pressure 140/65, pulse 88, temperature 98.7 F (37.1 C), resp. rate 16, height 5' 7.5" (1.715 m), weight (!) 414 lb (187.8 kg), SpO2 93 %. Wt Readings from Last 3 Encounters:  10/31/20 (!) 414 lb (187.8 kg)  10/13/20 (!) 419 lb (190.1 kg)  10/12/20 (!) 419 lb 1.9 oz (190.1 kg)   Body mass index is 63.88 kg/m. Performance status (ECOG): 1 - Symptomatic but  completely ambulatory Physical Exam Constitutional:      Appearance: Normal appearance. She is not ill-appearing.  HENT:     Mouth/Throat:     Mouth: Mucous membranes are moist.     Pharynx: Oropharynx is clear. No oropharyngeal exudate or posterior oropharyngeal erythema.  Cardiovascular:     Rate and Rhythm: Normal rate and regular rhythm.     Heart sounds: No murmur heard.   No friction rub. No gallop.  Pulmonary:     Effort: Pulmonary effort is normal. No respiratory distress.     Breath sounds: Normal breath sounds. No wheezing, rhonchi or rales.  Abdominal:     General: Bowel sounds are normal. There is no distension.     Palpations: Abdomen is soft. There is no mass.     Tenderness: There is no abdominal tenderness.  Musculoskeletal:        General: No swelling.     Right lower leg: No edema.     Left lower leg: No edema.  Lymphadenopathy:     Cervical: No cervical adenopathy.     Upper Body:     Right upper body: No supraclavicular or axillary adenopathy.     Left upper body: No supraclavicular or axillary adenopathy.     Lower Body: No right inguinal adenopathy. No left inguinal adenopathy.  Skin:    General: Skin is warm.     Coloration: Skin is not jaundiced.     Findings: No lesion or rash.  Neurological:  General: No focal deficit present.     Mental Status: She is alert and oriented to person, place, and time. Mental status is at baseline.     Cranial Nerves: Cranial nerves are intact.  Psychiatric:        Mood and Affect: Mood normal.        Behavior: Behavior normal.        Thought Content: Thought content normal.    LABS:     ASSESSMENT & PLAN:  A 50 y.o. female with metastatic endometrial cancer.  I am very concerned with the amount of neuropathy she has developed after just 1 cycle of paclitaxel.  Based upon this, I am not comfortable proceeding with additional cycles of paclitaxel.  Gemcitabine will now be used, in conjunction with carboplatin.   Her chemotherapy doses will be decreased by 25% due to her having a neutropenic fever/sepsis despite receiving Neulasta.  The only difference with her chemotherapy schedule moving forward will be that gemcitabine will be given on days 1 and 8.  Carboplatin will continue to be given on day 1.  She finishes her antibiotics for her urosepsis in 2 days.  She will proceed with carboplatin/gemcitabine the following day.  Otherwise, I will see her back  in 3 weeks before she heads into her 3rd cycle of carboplatin-based chemotherapy.  The patient understands all the plans discussed today and is in agreement with them.   I, Rita Ohara, am acting as scribe for Marice Potter, MD    I have reviewed this report as typed by the medical scribe, and it is complete and accurate.  Dequincy Macarthur Critchley, MD

## 2020-10-30 NOTE — Telephone Encounter (Signed)
Dr. Bobby Rumpf okay with [facility] recommendation. Michele aware.

## 2020-10-31 ENCOUNTER — Other Ambulatory Visit: Payer: Self-pay | Admitting: Pharmacist

## 2020-10-31 ENCOUNTER — Inpatient Hospital Stay (INDEPENDENT_AMBULATORY_CARE_PROVIDER_SITE_OTHER): Payer: Medicaid Other | Admitting: Oncology

## 2020-10-31 ENCOUNTER — Inpatient Hospital Stay: Payer: Medicaid Other

## 2020-10-31 ENCOUNTER — Telehealth: Payer: Self-pay

## 2020-10-31 VITALS — BP 140/65 | HR 88 | Temp 98.7°F | Resp 16 | Ht 67.5 in | Wt >= 6400 oz

## 2020-10-31 DIAGNOSIS — C541 Malignant neoplasm of endometrium: Secondary | ICD-10-CM

## 2020-10-31 NOTE — Telephone Encounter (Signed)
I notified Dr Bobby Rumpf of below. He is in agreement for PT orders below.   Received call from [place], with [facility]. She is requesting verbal orders for physical therapy to see pt once weekly x 4, pending medicaid authorization.

## 2020-11-01 ENCOUNTER — Inpatient Hospital Stay: Payer: Medicaid Other

## 2020-11-01 ENCOUNTER — Other Ambulatory Visit: Payer: Medicaid Other

## 2020-11-01 ENCOUNTER — Ambulatory Visit: Payer: Medicaid Other | Admitting: Oncology

## 2020-11-02 ENCOUNTER — Other Ambulatory Visit: Payer: Self-pay | Admitting: Neurology

## 2020-11-02 ENCOUNTER — Other Ambulatory Visit: Payer: Self-pay | Admitting: Oncology

## 2020-11-02 ENCOUNTER — Ambulatory Visit: Payer: Medicaid Other

## 2020-11-02 ENCOUNTER — Other Ambulatory Visit: Payer: Self-pay | Admitting: Pharmacist

## 2020-11-02 ENCOUNTER — Inpatient Hospital Stay: Payer: Medicaid Other

## 2020-11-02 ENCOUNTER — Telehealth: Payer: Self-pay

## 2020-11-02 DIAGNOSIS — C541 Malignant neoplasm of endometrium: Secondary | ICD-10-CM

## 2020-11-02 NOTE — Telephone Encounter (Signed)
I received call from [place], with [facility] home health. They have been going in the home providing IV antibiotic infusion. Pt's port was last accessed on Sat, 10/28/20. Pt has chemo tomorrow, and she wanted to make sure she didn't need to deaccess port. I told her since port was accessed less than 7 days ago, she can live huber needle in place. We will deaccess after treatment tomorrow.

## 2020-11-03 ENCOUNTER — Ambulatory Visit: Payer: Medicaid Other

## 2020-11-03 ENCOUNTER — Inpatient Hospital Stay: Payer: Medicaid Other

## 2020-11-03 ENCOUNTER — Other Ambulatory Visit: Payer: Self-pay

## 2020-11-03 ENCOUNTER — Encounter: Payer: Self-pay | Admitting: Oncology

## 2020-11-03 VITALS — BP 140/65 | HR 101 | Temp 98.3°F | Resp 18 | Ht 67.5 in | Wt >= 6400 oz

## 2020-11-03 DIAGNOSIS — C541 Malignant neoplasm of endometrium: Secondary | ICD-10-CM

## 2020-11-03 DIAGNOSIS — Z5111 Encounter for antineoplastic chemotherapy: Secondary | ICD-10-CM | POA: Diagnosis not present

## 2020-11-03 MED ORDER — SODIUM CHLORIDE 0.9 % IV SOLN
Freq: Once | INTRAVENOUS | Status: AC
Start: 2020-11-03 — End: 2020-11-03

## 2020-11-03 MED ORDER — HEPARIN SOD (PORK) LOCK FLUSH 100 UNIT/ML IV SOLN
500.0000 [IU] | Freq: Once | INTRAVENOUS | Status: AC | PRN
  Administered 2020-11-03: 500 [IU]

## 2020-11-03 MED ORDER — PALONOSETRON HCL INJECTION 0.25 MG/5ML
0.2500 mg | Freq: Once | INTRAVENOUS | Status: AC
  Administered 2020-11-03: 0.25 mg via INTRAVENOUS
  Filled 2020-11-03: qty 5

## 2020-11-03 MED ORDER — SODIUM CHLORIDE 0.9 % IV SOLN
150.0000 mg | Freq: Once | INTRAVENOUS | Status: AC
  Administered 2020-11-03: 150 mg via INTRAVENOUS
  Filled 2020-11-03: qty 5

## 2020-11-03 MED ORDER — SODIUM CHLORIDE 0.9 % IV SOLN: Freq: Once | INTRAVENOUS | Status: DC

## 2020-11-03 MED ORDER — SODIUM CHLORIDE 0.9 % IV SOLN
562.5000 mg/m2 | Freq: Once | INTRAVENOUS | Status: AC
  Administered 2020-11-03: 1672 mg via INTRAVENOUS
  Filled 2020-11-03: qty 43.97

## 2020-11-03 MED ORDER — SODIUM CHLORIDE 0.9% FLUSH
10.0000 mL | INTRAVENOUS | Status: DC | PRN
  Administered 2020-11-03: 10 mL

## 2020-11-03 MED ORDER — SODIUM CHLORIDE 0.9 % IV SOLN
10.0000 mg | Freq: Once | INTRAVENOUS | Status: AC
  Administered 2020-11-03: 10 mg via INTRAVENOUS
  Filled 2020-11-03: qty 1

## 2020-11-03 MED ORDER — SODIUM CHLORIDE 0.9 % IV SOLN
562.5000 mg | Freq: Once | INTRAVENOUS | Status: AC
  Administered 2020-11-03: 560 mg via INTRAVENOUS
  Filled 2020-11-03: qty 56

## 2020-11-03 NOTE — Progress Notes (Signed)
1520:PT STABLE AT TIME OF DISCHARGE ? ?

## 2020-11-03 NOTE — Patient Instructions (Signed)
[facility]  Discharge Instructions: Thank you for choosing [facility] to provide your oncology and hematology care.  If you have a lab appointment with the [facility], please go directly to the [facility] and check in at the registration area.   Wear comfortable clothing and clothing appropriate for easy access to any Portacath or PICC line.   We strive to give you quality time with your provider. You may need to reschedule your appointment if you arrive late (15 or more minutes).  Arriving late affects you and other patients whose appointments are after yours.  Also, if you miss three or more appointments without notifying the office, you may be dismissed from the clinic at the provider's discretion.      For prescription refill requests, have your pharmacy contact our office and allow 72 hours for refills to be completed.    Today you received the following chemotherapy and/or immunotherapy agents Gemcitabine,Carboplatin regimin change teaching completed/consent.   To help prevent nausea and vomiting after your treatment, we encourage you to take your nausea medication as directed.  BELOW ARE SYMPTOMS THAT SHOULD BE REPORTED IMMEDIATELY: *FEVER GREATER THAN 100.4 F (38 C) OR HIGHER *CHILLS OR SWEATING *NAUSEA AND VOMITING THAT IS NOT CONTROLLED WITH YOUR NAUSEA MEDICATION *UNUSUAL SHORTNESS OF BREATH *UNUSUAL BRUISING OR BLEEDING *URINARY PROBLEMS (pain or burning when urinating, or frequent urination) *BOWEL PROBLEMS (unusual diarrhea, constipation, pain near the anus) TENDERNESS IN MOUTH AND THROAT WITH OR WITHOUT PRESENCE OF ULCERS (sore throat, sores in mouth, or a toothache) UNUSUAL RASH, SWELLING OR PAIN  UNUSUAL VAGINAL DISCHARGE OR ITCHING   Items with * indicate a potential emergency and should be followed up as soon as possible or go to the Emergency Department if any problems should occur.  Please show the CHEMOTHERAPY ALERT  CARD or IMMUNOTHERAPY ALERT CARD at check-in to the Emergency Department and triage nurse.  Should you have questions after your visit or need to cancel or reschedule your appointment, please contact [facility]  Dept: [phone]  and follow the prompts.  Office hours are 8:00 a.m. to 4:30 p.m. Monday - Friday. Please note that voicemails left after 4:00 p.m. may not be returned until the following business day.  We are closed weekends and major holidays. You have access to a nurse at all times for urgent questions. Please call the main number to the clinic Dept: [phone] and follow the prompts.  For any non-urgent questions, you may also contact your provider using MyChart. We now offer e-Visits for anyone 10 and older to request care online for non-urgent symptoms. For details visit [email or web address].   Also download the MyChart app! Go to the app store, search "MyChart", open the app, select [facility], and log in with your MyChart username and password.  Due to Covid, a mask is required upon entering the hospital/clinic. If you do not have a mask, one will be given to you upon arrival. For doctor visits, patients may have 1 support person aged 38 or older with them. For treatment visits, patients cannot have anyone with them due to current Covid guidelines and our immunocompromised population.

## 2020-11-06 ENCOUNTER — Telehealth: Payer: Self-pay

## 2020-11-06 NOTE — Progress Notes (Signed)
..  Pharmacist Chemotherapy Monitoring - Initial Assessment    Anticipated start date: 11/03/20  The following has been reviewed per standard work regarding the patient's treatment regimen: The patient's diagnosis, treatment plan and drug doses, and organ/hematologic function Lab orders and baseline tests specific to treatment regimen  The treatment plan start date, drug sequencing, and pre-medications Prior authorization status  Patient's documented medication list, including drug-drug interaction screen and prescriptions for anti-emetics and supportive care specific to the treatment regimen The drug concentrations, fluid compatibility, administration routes, and timing of the medications to be used The patient's access for treatment and lifetime cumulative dose history, if applicable  The patient's medication allergies and previous infusion related reactions, if applicable   Changes made to treatment plan:  N/A  Follow up needed:  N/A   Juanetta Beets, [facility], 11/02/2020  3:19 PM

## 2020-11-06 NOTE — Telephone Encounter (Signed)
I spoke with pt. She states she really couldn't tell much difference in how she feels, with the addition of the Gemzar. She did report intermittent nausea, that is controlled with antiemetics. I reminded pt of the importance in calling us if she develops temp of 100.4 or higher DAY OR NIGHT. She verbalized understanding.

## 2020-11-07 ENCOUNTER — Ambulatory Visit: Payer: Self-pay | Admitting: Genetic Counselor

## 2020-11-07 ENCOUNTER — Encounter: Payer: Medicaid Other | Admitting: Physical Medicine & Rehabilitation

## 2020-11-07 ENCOUNTER — Encounter: Payer: Self-pay | Admitting: Genetic Counselor

## 2020-11-07 ENCOUNTER — Telehealth: Payer: Self-pay | Admitting: Genetic Counselor

## 2020-11-07 DIAGNOSIS — Z1379 Encounter for other screening for genetic and chromosomal anomalies: Secondary | ICD-10-CM | POA: Insufficient documentation

## 2020-11-07 DIAGNOSIS — Z800 Family history of malignant neoplasm of digestive organs: Secondary | ICD-10-CM

## 2020-11-07 DIAGNOSIS — Z8042 Family history of malignant neoplasm of prostate: Secondary | ICD-10-CM

## 2020-11-07 DIAGNOSIS — C541 Malignant neoplasm of endometrium: Secondary | ICD-10-CM

## 2020-11-07 NOTE — Progress Notes (Signed)
HPI:  Janet [name] was previously seen in the [place] clinic due to a personal history of cancer and concerns regarding a hereditary predisposition to cancer. Please refer to our prior cancer genetics clinic note for more information regarding our discussion, assessment and recommendations, at the time. Janet [name] recent genetic test results were disclosed to her, as were recommendations warranted by these results. These results and recommendations are discussed in more detail below.  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  The report date is October 19, 2020.    The Common Hereditary Cancers + RNA Panel offered by [facility] includes sequencing, deletion/duplication, and RNA testing of the following 47 genes: APC, ATM, AXIN2, BARD1, BMPR1A, BRCA1, BRCA2, BRIP1, CDH1, CDK4*, CDKN2A (p14ARF)*, CDKN2A (p16INK4a)*, CHEK2, CTNNA1, DICER1, EPCAM (Deletion/duplication testing only), GREM1 (promoter region deletion/duplication testing only), KIT, MEN1, MLH1, MSH2, MSH3, MSH6, MUTYH, NBN, NF1, NHTL1, PALB2, PDGFRA*, PMS2, POLD1, POLE, PTEN, RAD50, RAD51C, RAD51D, SDHB, SDHC, SDHD, SMAD4, SMARCA4. STK11, TP53, TSC1, TSC2, and VHL.  The following genes were evaluated for sequence changes only: SDHA and HOXB13 c.251G>A variant only.  RNA analysis is not performed for the * genes.     11/03/2020 -  Chemotherapy    Patient is on Treatment Plan: UTERINE CARBOPLATIN D1 / GEMCITABINE D1,8 Q21D         FAMILY HISTORY:  We obtained a detailed, 4-generation family history.  Significant diagnoses are listed below: Family History  Problem Relation Age of Onset   Prostate cancer Father        dx after 81   Colon  cancer Maternal Aunt        dx after 61    Janet [name] is unaware of previous family history of genetic testing for hereditary cancer risks. There is no reported Ashkenazi Jewish ancestry. There is no known consanguinity.  GENETIC TEST RESULTS: Genetic testing reported out on October 19, 2020. The [facility] Common Hereditary Cancers +RNA Panel found no pathogenic mutations.  The Common Hereditary Cancers + RNA Panel offered by [facility] includes sequencing, deletion/duplication, and RNA testing of the following 47 genes: APC, ATM, AXIN2, BARD1, BMPR1A, BRCA1, BRCA2, BRIP1, CDH1, CDK4*, CDKN2A (p14ARF)*, CDKN2A (p16INK4a)*, CHEK2, CTNNA1, DICER1, EPCAM (Deletion/duplication testing only), GREM1 (promoter region deletion/duplication testing only), KIT, MEN1, MLH1, MSH2, MSH3, MSH6, MUTYH, NBN, NF1, NHTL1, PALB2, PDGFRA*, PMS2, POLD1, POLE, PTEN, RAD50, RAD51C, RAD51D, SDHB, SDHC, SDHD, SMAD4, SMARCA4. STK11, TP53, TSC1, TSC2, and VHL.  The following genes were evaluated for sequence changes only: SDHA and HOXB13 c.251G>A variant only.  RNA analysis is not performed for the * genes.    The test report has been scanned into EPIC and is located under the Molecular Pathology section of the Results Review tab.  A portion of the result report is included below for reference.     We discussed with Janet [name] that because current genetic testing is not perfect, it is possible there may be a gene mutation in one of these genes that current testing cannot detect, but that chance is small.  We also discussed, that there could be another gene that has not yet been discovered, or that we have not yet tested, that is responsible for the cancer diagnoses in  the family. It is also possible there is a hereditary cause for the cancer in the family that Janet [name] did not inherit and therefore was not identified in her testing.  Therefore, it is important to remain in touch with cancer genetics in the future so that we can  continue to offer Janet [name] the most up to date genetic testing.   ADDITIONAL GENETIC TESTING: There are other genes that are associated with increased cancer risk that can be analyzed. Should Janet [name] wish to pursue additional genetic testing, we are happy to discuss and coordinate this testing, at any time.     CANCER SCREENING RECOMMENDATIONS: Janet [name] test result is considered negative (normal).  This means that we have not identified a hereditary cause for her personal history of cancer at this time. Most cancers are sporadic/familial, and this negative test suggests that her cancer may fall into this category.    While reassuring, this does not definitively rule out a hereditary predisposition to cancer. It is still possible that there could be genetic mutations that are undetectable by current technology. There could be genetic mutations in genes that have not been tested or identified to increase cancer risk.  Therefore, it is recommended she continue to follow the cancer management and screening guidelines provided by her oncology and primary healthcare provider.   An individual's cancer risk and medical management are not determined by genetic test results alone. Overall cancer risk assessment incorporates additional factors, including personal medical history, family history, and any available genetic information that may result in a personalized plan for cancer prevention and surveillance  RECOMMENDATIONS FOR FAMILY MEMBERS:  Individuals in this family might be at some increased risk of developing cancer, over the general population risk, simply due to the family history of cancer.  We recommended women in this family have a yearly mammogram beginning at age 40, or 57 years younger than the earliest onset of cancer, an annual clinical breast exam, and perform monthly breast self-exams. Women in this family should also have a gynecological exam as recommended by their primary  provider. Family members should be referred for colonoscopy starting at age 14, or earlier, as recommended by their providers.   FOLLOW-UP: Lastly, we discussed with Janet [name] that cancer genetics is a rapidly advancing field and it is possible that new genetic tests will be appropriate for her and/or her family members in the future. We encouraged her to remain in contact with cancer genetics on an annual basis so we can update her personal and family histories and let her know of advances in cancer genetics that may benefit this family.   Our contact number was provided. Janet [name] questions were answered to her satisfaction, and she knows she is welcome to call us at anytime with additional questions or concerns.     Mareo Portilla M. Joette Catching, [place], [facility] Genetic Counselor [email or web address] (P) [phone]

## 2020-11-07 NOTE — Telephone Encounter (Signed)
Revealed negative genetic testing.  Discussed that we do not know why she has endometrial cancer or why there is cancer in the family. It could be sporadic/familial, due to a different gene that we are not testing, or maybe our current technology may not be able to pick something up.  It will be important for her to keep in contact with genetics to keep up with whether additional testing may be needed.

## 2020-11-09 ENCOUNTER — Other Ambulatory Visit: Payer: Self-pay

## 2020-11-09 ENCOUNTER — Other Ambulatory Visit: Payer: Self-pay | Admitting: Hematology and Oncology

## 2020-11-09 ENCOUNTER — Telehealth: Payer: Self-pay | Admitting: Pharmacist

## 2020-11-09 ENCOUNTER — Inpatient Hospital Stay: Payer: Medicaid Other

## 2020-11-09 DIAGNOSIS — C541 Malignant neoplasm of endometrium: Secondary | ICD-10-CM

## 2020-11-09 LAB — CBC
MCV: 82 (ref 81–99)
RBC: 3.47 — AB (ref 3.87–5.11)

## 2020-11-09 LAB — BASIC METABOLIC PANEL
BUN: 12 (ref 4–21)
CO2: 27 — AB (ref 13–22)
Chloride: 103 (ref 99–108)
Creatinine: 0.7 (ref 0.5–1.1)
Glucose: 195
Potassium: 3.5 (ref 3.4–5.3)
Sodium: 137 (ref 137–147)

## 2020-11-09 LAB — COMPREHENSIVE METABOLIC PANEL
Albumin: 2.5 — AB (ref 3.5–5.0)
Calcium: 7.8 — AB (ref 8.7–10.7)

## 2020-11-09 LAB — CBC AND DIFFERENTIAL
HCT: 82 — AB (ref 36–46)
Hemoglobin: 9.2 — AB (ref 12.0–16.0)
Neutrophils Absolute: 0.87
Platelets: 44 — AB (ref 150–399)
WBC: 1.5

## 2020-11-09 LAB — HEPATIC FUNCTION PANEL
ALT: 49 — AB (ref 7–35)
AST: 76 — AB (ref 13–35)
Alkaline Phosphatase: 150 — AB (ref 25–125)
Bilirubin, Total: 3

## 2020-11-09 LAB — CORRECTED CALCIUM (CC13): Calcium, Corrected: 9.3

## 2020-11-09 NOTE — Telephone Encounter (Signed)
Spoke with Belenda Cruise and discussed her lab work and the need to hold her treatment tomorrow.  Discussed that her platelets and neutrophil count were too low for treatment and recommended that she contact us or go to the ED if she notices any bleeding.  She will return on 10/13 with labs and to see Dr. Bobby Rumpf.  If labs are better, she will be treated again on 10/14.

## 2020-11-10 ENCOUNTER — Inpatient Hospital Stay: Payer: Medicaid Other

## 2020-11-14 ENCOUNTER — Telehealth: Payer: Self-pay

## 2020-11-14 NOTE — Telephone Encounter (Signed)
Returned call to pt.  I asked her if she has a physician that manages her diabetes.   She stated that she does.  I told her to contact him and ask what and how much she should add back into her regime.  She said she would.

## 2020-11-16 ENCOUNTER — Encounter: Payer: Self-pay | Admitting: Oncology

## 2020-11-17 NOTE — Progress Notes (Signed)
[facility]  [address] [place],  [place]  [address] [phone]  Clinic Day:  11/23/2020  Referring physician: Charlott Rakes, MD  This document serves as a record of services personally performed by Micki Cassel Macarthur Critchley, MD. It was created on their behalf by Curry,Lauren E, a trained medical scribe. The creation of this record is based on the scribe's personal observations and the provider's statements to them.  HISTORY OF PRESENT ILLNESS:  The patient is a 50 y.o. female  with metastatic endometrial cancer.  She comes in today prior to her 3rd cycle of treatment, which now consists gemcitabine/carboplatin. As it pertains to her disease, she denies having abdominal pain or other symptoms which concern her for disease progression.  Of note, this patient was recently hospitalized for apparent hepatic encephalopathy.  Before this hospitalization, there was no prior history of underlying liver disease being present.  She is currently on medication to prevent recurrent bouts of encephalopathy.    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  All prior scans up until recently had shown no evidence of disease recurrence.      PHYSICAL EXAM:  Blood pressure (!) 189/80, pulse (!) 102, temperature 98.1 F (36.7 C), resp. rate 18, height 5' 7.5" (1.715 m), weight (!) 430 lb 6.4 oz (195.2 kg), SpO2 98 %. Wt Readings from Last 3 Encounters:  11/23/20 (!) 430 lb 6.4 oz (195.2 kg)  11/03/20 (!) 414 lb 4 oz (187.9 kg)  10/31/20 (!) 414 lb (187.8 kg)   Body mass index is 66.42 kg/m. Performance status (ECOG): 1 - Symptomatic but completely ambulatory Physical Exam Constitutional:      Appearance: Normal appearance. She is not ill-appearing.  HENT:     Mouth/Throat:     Mouth: Mucous membranes are moist.     Pharynx: Oropharynx is clear. No oropharyngeal exudate or posterior oropharyngeal erythema.  Cardiovascular:      Rate and Rhythm: Regular rhythm. Tachycardia present.     Heart sounds: No murmur heard.   No friction rub. No gallop.  Pulmonary:     Effort: Pulmonary effort is normal. No respiratory distress.     Breath sounds: Normal breath sounds. No wheezing, rhonchi or rales.  Abdominal:     General: Bowel sounds are normal. There is no distension.     Palpations: Abdomen is soft. There is no mass.     Tenderness: There is no abdominal tenderness.  Musculoskeletal:        General: No swelling.     Right lower leg: No edema.     Left lower leg: No edema.  Lymphadenopathy:     Cervical: No cervical adenopathy.     Upper Body:     Right upper body: No supraclavicular or axillary adenopathy.     Left upper body: No supraclavicular or axillary adenopathy.     Lower Body: No right inguinal adenopathy. No left inguinal adenopathy.  Skin:    General: Skin is warm.     Coloration: Skin is not jaundiced.     Findings: No lesion or rash.  Neurological:     General: No focal deficit present.     Mental Status: She is alert and oriented to person, place, and time. Mental status is at baseline.     Cranial Nerves: Cranial nerves are intact.  Psychiatric:        Mood and Affect: Mood normal.  Behavior: Behavior normal.        Thought Content: Thought content normal.    LABS:    Ref. Range 11/23/2020 00:00  Sodium Latest Ref Range: 137 - 147  138  Potassium Latest Ref Range: 3.4 - 5.3  3.6  Chloride Latest Ref Range: 99 - 108  108  CO2 Latest Ref Range: 13 - 22  18  Glucose Unknown 122  BUN Latest Ref Range: 4 - 21  5  Creatinine Latest Ref Range: 0.5 - 1.1  0.8  Calcium Latest Ref Range: 8.7 - 10.7  8.3 (A)  Alkaline Phosphatase Latest Ref Range: 25 - 125  223 (A)  Albumin Latest Ref Range: 3.5 - 5.0  3.0 (A)  AST Latest Ref Range: 13 - 35  135 (A)  ALT Latest Ref Range: 7 - 35  58 (A)  Total Protein Latest Units: g/dL 6.2  Bilirubin, Total Unknown 3.5  WBC Unknown 3.3  RBC Latest  Ref Range: 3.87 - 5.11  3.74 (A)  Hemoglobin Latest Ref Range: 12.0 - 16.0  10.7 (A)  HCT Latest Ref Range: 36 - 46  32 (A)  MCV Latest Ref Range: 81 - 99  84  Platelets Latest Ref Range: 150 - 399  152  NEUT# Unknown 1.35    ASSESSMENT & PLAN:  A 50 y.o. female with metastatic endometrial cancer.  She will proceed with her 3rd cycle of carboplatin-based chemotherapy tomorrow, which now includes gemcitabine.  Overall, she appears to be doing okay.  I will see her back  in 3 weeks before she heads into her 4th  cycle of carboplatin-based chemotherapy.  CT scans will be done before her next visit to ascertain her new disease baseline.  The patient understands all the plans discussed today and is in agreement with them.   I, Rita Ohara, am acting as scribe for Marice Potter, MD    I have reviewed this report as typed by the medical scribe, and it is complete and accurate.  Lamees Gable Macarthur Critchley, MD

## 2020-11-21 ENCOUNTER — Telehealth: Payer: Self-pay | Admitting: Family Medicine

## 2020-11-21 NOTE — Telephone Encounter (Signed)
Provider out the office on 10/19 in the evening. Called patient left vm to call [phone] to reschedule.

## 2020-11-22 ENCOUNTER — Telehealth: Payer: Self-pay | Admitting: Family Medicine

## 2020-11-22 ENCOUNTER — Telehealth: Payer: Self-pay | Admitting: *Deleted

## 2020-11-22 DIAGNOSIS — K7469 Other cirrhosis of liver: Secondary | ICD-10-CM

## 2020-11-22 NOTE — Telephone Encounter (Signed)
Copied from [place] [phone]. Topic: Referral - Request for Referral >> Nov 22, 2020  3:04 PM Leward Quan A wrote: Per Has patient seen PCP for this complaint? Yes.   *If NO, is insurance requiring patient see PCP for this issue before PCP can refer them? Referral for which specialty: Liver Specialist  Preferred provider/office: [facility] Ph# [phone] Reason for referral: Liver biopsy Per patient this is an urgent matter

## 2020-11-22 NOTE — Telephone Encounter (Signed)
Copied from [place] [phone]. Topic: General - Inquiry >> Nov 21, 2020  3:45 PM Loma Boston wrote: Reason for CRM: [facility] on behalf of pt pt last seen by pcp 5/21 and they want a confirmation that Dr Margarita Rana will work with them re [place]. Pls fu with Dorian Pod at [place] to confirm [phone]

## 2020-11-23 ENCOUNTER — Inpatient Hospital Stay: Payer: Medicare Other

## 2020-11-23 ENCOUNTER — Telehealth: Payer: Self-pay | Admitting: *Deleted

## 2020-11-23 ENCOUNTER — Other Ambulatory Visit: Payer: Self-pay | Admitting: Hematology and Oncology

## 2020-11-23 ENCOUNTER — Other Ambulatory Visit: Payer: Self-pay | Admitting: Oncology

## 2020-11-23 ENCOUNTER — Inpatient Hospital Stay: Payer: Medicare Other | Attending: Hematology and Oncology | Admitting: Oncology

## 2020-11-23 ENCOUNTER — Telehealth: Payer: Self-pay

## 2020-11-23 VITALS — BP 189/80 | HR 102 | Temp 98.1°F | Resp 18 | Ht 67.5 in | Wt >= 6400 oz

## 2020-11-23 DIAGNOSIS — C541 Malignant neoplasm of endometrium: Secondary | ICD-10-CM

## 2020-11-23 DIAGNOSIS — Z5111 Encounter for antineoplastic chemotherapy: Secondary | ICD-10-CM | POA: Insufficient documentation

## 2020-11-23 DIAGNOSIS — C800 Disseminated malignant neoplasm, unspecified: Secondary | ICD-10-CM

## 2020-11-23 DIAGNOSIS — C786 Secondary malignant neoplasm of retroperitoneum and peritoneum: Secondary | ICD-10-CM | POA: Insufficient documentation

## 2020-11-23 LAB — HEPATIC FUNCTION PANEL
ALT: 58 — AB (ref 7–35)
AST: 135 — AB (ref 13–35)
Alkaline Phosphatase: 223 — AB (ref 25–125)
Bilirubin, Total: 3.5

## 2020-11-23 LAB — CBC
MCV: 84 (ref 81–99)
RBC: 3.74 — AB (ref 3.87–5.11)

## 2020-11-23 LAB — CBC AND DIFFERENTIAL
HCT: 32 — AB (ref 36–46)
Hemoglobin: 10.7 — AB (ref 12.0–16.0)
Neutrophils Absolute: 1.35
Platelets: 152 (ref 150–399)
WBC: 3.3

## 2020-11-23 LAB — BASIC METABOLIC PANEL
BUN: 5 (ref 4–21)
CO2: 18 (ref 13–22)
Chloride: 108 (ref 99–108)
Creatinine: 0.8 (ref 0.5–1.1)
Glucose: 122
Potassium: 3.6 (ref 3.4–5.3)
Sodium: 138 (ref 137–147)

## 2020-11-23 LAB — CORRECTED CALCIUM (CC13): Calcium, Corrected: 9.3

## 2020-11-23 LAB — COMPREHENSIVE METABOLIC PANEL
Albumin: 3 — AB (ref 3.5–5.0)
Calcium: 8.3 — AB (ref 8.7–10.7)

## 2020-11-23 LAB — PROTEIN, TOTAL: Total Protein: 6.2 g/dL

## 2020-11-23 MED FILL — Dexamethasone Sodium Phosphate Inj 100 MG/10ML: INTRAMUSCULAR | Qty: 1 | Status: AC

## 2020-11-23 MED FILL — Fosaprepitant Dimeglumine For IV Infusion 150 MG (Base Eq): INTRAVENOUS | Qty: 5 | Status: AC

## 2020-11-23 MED FILL — Gemcitabine HCl Inj 1 GM/26.3ML (38 MG/ML) (Base Equiv): INTRAVENOUS | Qty: 44 | Status: AC

## 2020-11-23 MED FILL — Carboplatin IV Soln 600 MG/60ML: INTRAVENOUS | Qty: 56 | Status: AC

## 2020-11-23 NOTE — Telephone Encounter (Signed)
Pt requesting RX for Rolator.

## 2020-11-23 NOTE — Telephone Encounter (Signed)
Copied from [place] [phone]. Topic: General - Other >> Nov 22, 2020 12:27 PM Yvette Rack wrote: Reason for CRM: Pt stated she needs a Rx for either a wheelchair or rollator. Pt rescheduled appt with Dr. Margarita Rana for 01/24/21 and asked if she could get the Rx before the appt since she has been waiting and the appt was cancelled due to provider not being available. Cb# [phone]

## 2020-11-23 NOTE — Telephone Encounter (Signed)
Per Dr. Bobby Rumpf:  He isn't comfortable managing her medication prescribed for hepatic encephalopathy.  I let Dorian Pod know.

## 2020-11-23 NOTE — Telephone Encounter (Signed)
Referral has been placed. 

## 2020-11-23 NOTE — Telephone Encounter (Signed)
Wanting to know if you will be the attending PCP for her Home health orders.

## 2020-11-24 ENCOUNTER — Other Ambulatory Visit: Payer: Self-pay

## 2020-11-24 ENCOUNTER — Inpatient Hospital Stay: Payer: Medicare Other

## 2020-11-24 VITALS — BP 152/75 | HR 104 | Temp 98.1°F | Resp 18 | Ht 67.5 in | Wt >= 6400 oz

## 2020-11-24 DIAGNOSIS — Z5111 Encounter for antineoplastic chemotherapy: Secondary | ICD-10-CM | POA: Diagnosis present

## 2020-11-24 DIAGNOSIS — C786 Secondary malignant neoplasm of retroperitoneum and peritoneum: Secondary | ICD-10-CM | POA: Diagnosis present

## 2020-11-24 DIAGNOSIS — C541 Malignant neoplasm of endometrium: Secondary | ICD-10-CM

## 2020-11-24 MED ORDER — SODIUM CHLORIDE 0.9 % IV SOLN: Freq: Once | INTRAVENOUS | Status: DC

## 2020-11-24 MED ORDER — MISC. DEVICES MISC: 0 refills | Status: AC

## 2020-11-24 MED ORDER — HEPARIN SOD (PORK) LOCK FLUSH 100 UNIT/ML IV SOLN
500.0000 [IU] | Freq: Once | INTRAVENOUS | Status: AC | PRN
  Administered 2020-11-24: 500 [IU]

## 2020-11-24 MED ORDER — SODIUM CHLORIDE 0.9 % IV SOLN
562.5000 mg/m2 | Freq: Once | INTRAVENOUS | Status: AC
  Administered 2020-11-24: 1672 mg via INTRAVENOUS
  Filled 2020-11-24: qty 43.97

## 2020-11-24 MED ORDER — SODIUM CHLORIDE 0.9 % IV SOLN: Freq: Once | INTRAVENOUS | Status: AC

## 2020-11-24 MED ORDER — SODIUM CHLORIDE 0.9% FLUSH
10.0000 mL | INTRAVENOUS | Status: DC | PRN
  Administered 2020-11-24: 10 mL

## 2020-11-24 MED ORDER — SODIUM CHLORIDE 0.9 % IV SOLN
562.5000 mg | Freq: Once | INTRAVENOUS | Status: AC
  Administered 2020-11-24: 560 mg via INTRAVENOUS
  Filled 2020-11-24: qty 56

## 2020-11-24 MED ORDER — SODIUM CHLORIDE 0.9 % IV SOLN
150.0000 mg | Freq: Once | INTRAVENOUS | Status: AC
  Administered 2020-11-24: 150 mg via INTRAVENOUS
  Filled 2020-11-24: qty 150

## 2020-11-24 MED ORDER — SODIUM CHLORIDE 0.9 % IV SOLN
10.0000 mg | Freq: Once | INTRAVENOUS | Status: AC
  Administered 2020-11-24: 10 mg via INTRAVENOUS
  Filled 2020-11-24: qty 10

## 2020-11-24 MED ORDER — PALONOSETRON HCL INJECTION 0.25 MG/5ML
0.2500 mg | Freq: Once | INTRAVENOUS | Status: AC
  Administered 2020-11-24: 0.25 mg via INTRAVENOUS
  Filled 2020-11-24: qty 5

## 2020-11-24 NOTE — Telephone Encounter (Signed)
Insurance companies have criteria for PCP visits prior to placing orders.  Her last visit was over 17 months ago; she needs an office visit first.

## 2020-11-24 NOTE — Telephone Encounter (Signed)
Done

## 2020-11-24 NOTE — Telephone Encounter (Signed)
Script has been faxed to [facility].

## 2020-11-24 NOTE — Patient Instructions (Signed)
Janet [name]  Discharge Instructions: Thank you for choosing [facility] to provide your oncology and hematology care.  If you have a lab appointment with the [facility], please go directly to the [facility] and check in at the registration area.   Wear comfortable clothing and clothing appropriate for easy access to any Portacath or PICC line.   We strive to give you quality time with your provider. You may need to reschedule your appointment if you arrive late (15 or more minutes).  Arriving late affects you and other patients whose appointments are after yours.  Also, if you miss three or more appointments without notifying the office, you may be dismissed from the clinic at the provider's discretion.      For prescription refill requests, have your pharmacy contact our office and allow 72 hours for refills to be completed.    Today you received the following chemotherapy and/or immunotherapy agents carbo/gemzar    To help prevent nausea and vomiting after your treatment, we encourage you to take your nausea medication as directed.  BELOW ARE SYMPTOMS THAT SHOULD BE REPORTED IMMEDIATELY: *FEVER GREATER THAN 100.4 F (38 C) OR HIGHER *CHILLS OR SWEATING *NAUSEA AND VOMITING THAT IS NOT CONTROLLED WITH YOUR NAUSEA MEDICATION *UNUSUAL SHORTNESS OF BREATH *UNUSUAL BRUISING OR BLEEDING *URINARY PROBLEMS (pain or burning when urinating, or frequent urination) *BOWEL PROBLEMS (unusual diarrhea, constipation, pain near the anus) TENDERNESS IN MOUTH AND THROAT WITH OR WITHOUT PRESENCE OF ULCERS (sore throat, sores in mouth, or a toothache) UNUSUAL RASH, SWELLING OR PAIN  UNUSUAL VAGINAL DISCHARGE OR ITCHING   Items with * indicate a potential emergency and should be followed up as soon as possible or go to the Emergency Department if any problems should occur.  Please show the CHEMOTHERAPY ALERT CARD or IMMUNOTHERAPY ALERT CARD at check-in to the  Emergency Department and triage nurse.  Should you have questions after your visit or need to cancel or reschedule your appointment, please contact [facility]  Dept: [phone]  and follow the prompts.  Office hours are 8:00 a.m. to 4:30 p.m. Monday - Friday. Please note that voicemails left after 4:00 p.m. may not be returned until the following business day.  We are closed weekends and major holidays. You have access to a nurse at all times for urgent questions. Please call the main number to the clinic Dept: [phone] and follow the prompts.  For any non-urgent questions, you may also contact your provider using MyChart. We now offer e-Visits for anyone 104 and older to request care online for non-urgent symptoms. For details visit [email or web address].   Also download the MyChart app! Go to the app store, search "MyChart", open the app, select [facility], and log in with your MyChart username and password.  Due to Covid, a mask is required upon entering the hospital/clinic. If you do not have a mask, one will be given to you upon arrival. For doctor visits, patients may have 1 support person aged 67 or older with them. For treatment visits, patients cannot have anyone with them due to current Covid guidelines and our immunocompromised population.   Carboplatin injection What is this medication? CARBOPLATIN (KAR boe pla tin) is a chemotherapy drug. It targets fast dividing cells, like cancer cells, and causes these cells to die. This medicine is used to treat ovarian cancer and many other cancers. This medicine may be used for other purposes; ask your health care  provider or pharmacist if you have questions. COMMON BRAND NAME(S): Paraplatin What should I tell my care team before I take this medication? They need to know if you have any of these conditions: blood disorders hearing problems kidney disease recent or ongoing radiation therapy an unusual or  allergic reaction to carboplatin, cisplatin, other chemotherapy, other medicines, foods, dyes, or preservatives pregnant or trying to get pregnant breast-feeding How should I use this medication? This drug is usually given as an infusion into a vein. It is administered in a hospital or clinic by a specially trained health care professional. Talk to your pediatrician regarding the use of this medicine in children. Special care may be needed. Overdosage: If you think you have taken too much of this medicine contact a poison control center or emergency room at once. NOTE: This medicine is only for you. Do not share this medicine with others. What if I miss a dose? It is important not to miss a dose. Call your doctor or health care professional if you are unable to keep an appointment. What may interact with this medication? medicines for seizures medicines to increase blood counts like filgrastim, pegfilgrastim, sargramostim some antibiotics like amikacin, gentamicin, neomycin, streptomycin, tobramycin vaccines Talk to your doctor or health care professional before taking any of these medicines: acetaminophen aspirin ibuprofen ketoprofen naproxen This list may not describe all possible interactions. Give your health care provider a list of all the medicines, herbs, non-prescription drugs, or dietary supplements you use. Also tell them if you smoke, drink alcohol, or use illegal drugs. Some items may interact with your medicine. What should I watch for while using this medication? Your condition will be monitored carefully while you are receiving this medicine. You will need important blood work done while you are taking this medicine. This drug may make you feel generally unwell. This is not uncommon, as chemotherapy can affect healthy cells as well as cancer cells. Report any side effects. Continue your course of treatment even though you feel ill unless your doctor tells you to stop. In some  cases, you may be given additional medicines to help with side effects. Follow all directions for their use. Call your doctor or health care professional for advice if you get a fever, chills or sore throat, or other symptoms of a cold or flu. Do not treat yourself. This drug decreases your body's ability to fight infections. Try to avoid being around people who are sick. This medicine may increase your risk to bruise or bleed. Call your doctor or health care professional if you notice any unusual bleeding. Be careful brushing and flossing your teeth or using a toothpick because you may get an infection or bleed more easily. If you have any dental work done, tell your dentist you are receiving this medicine. Avoid taking products that contain aspirin, acetaminophen, ibuprofen, naproxen, or ketoprofen unless instructed by your doctor. These medicines may hide a fever. Do not become pregnant while taking this medicine. Women should inform their doctor if they wish to become pregnant or think they might be pregnant. There is a potential for serious side effects to an unborn child. Talk to your health care professional or pharmacist for more information. Do not breast-feed an infant while taking this medicine. What side effects may I notice from receiving this medication? Side effects that you should report to your doctor or health care professional as soon as possible: allergic reactions like skin rash, itching or hives, swelling of the face, lips,  or tongue signs of infection - fever or chills, cough, sore throat, pain or difficulty passing urine signs of decreased platelets or bleeding - bruising, pinpoint red spots on the skin, black, tarry stools, nosebleeds signs of decreased red blood cells - unusually weak or tired, fainting spells, lightheadedness breathing problems changes in hearing changes in vision chest pain high blood pressure low blood counts - This drug may decrease the number of white  blood cells, red blood cells and platelets. You may be at increased risk for infections and bleeding. nausea and vomiting pain, swelling, redness or irritation at the injection site pain, tingling, numbness in the hands or feet problems with balance, talking, walking trouble passing urine or change in the amount of urine Side effects that usually do not require medical attention (report to your doctor or health care professional if they continue or are bothersome): hair loss loss of appetite metallic taste in the mouth or changes in taste This list may not describe all possible side effects. Call your doctor for medical advice about side effects. You may report side effects to FDA at 1-800-FDA-1088. Where should I keep my medication? This drug is given in a hospital or clinic and will not be stored at home. NOTE: This sheet is a summary. It may not cover all possible information. If you have questions about this medicine, talk to your doctor, pharmacist, or health care provider.  2022 Elsevier/Gold Standard (2007-05-05 14:38:05) Gemcitabine injection What is this medication? GEMCITABINE (jem SYE ta been) is a chemotherapy drug. This medicine is used to treat many types of cancer like breast cancer, lung cancer, pancreatic cancer, and ovarian cancer. This medicine may be used for other purposes; ask your health care provider or pharmacist if you have questions. COMMON BRAND NAME(S): Gemzar, Infugem What should I tell my care team before I take this medication? They need to know if you have any of these conditions: blood disorders infection kidney disease liver disease lung or breathing disease, like asthma recent or ongoing radiation therapy an unusual or allergic reaction to gemcitabine, other chemotherapy, other medicines, foods, dyes, or preservatives pregnant or trying to get pregnant breast-feeding How should I use this medication? This drug is given as an infusion into a vein.  It is administered in a hospital or clinic by a specially trained health care professional. Talk to your pediatrician regarding the use of this medicine in children. Special care may be needed. Overdosage: If you think you have taken too much of this medicine contact a poison control center or emergency room at once. NOTE: This medicine is only for you. Do not share this medicine with others. What if I miss a dose? It is important not to miss your dose. Call your doctor or health care professional if you are unable to keep an appointment. What may interact with this medication? medicines to increase blood counts like filgrastim, pegfilgrastim, sargramostim some other chemotherapy drugs like cisplatin vaccines Talk to your doctor or health care professional before taking any of these medicines: acetaminophen aspirin ibuprofen ketoprofen naproxen This list may not describe all possible interactions. Give your health care provider a list of all the medicines, herbs, non-prescription drugs, or dietary supplements you use. Also tell them if you smoke, drink alcohol, or use illegal drugs. Some items may interact with your medicine. What should I watch for while using this medication? Visit your doctor for checks on your progress. This drug may make you feel generally unwell. This is not uncommon,  as chemotherapy can affect healthy cells as well as cancer cells. Report any side effects. Continue your course of treatment even though you feel ill unless your doctor tells you to stop. In some cases, you may be given additional medicines to help with side effects. Follow all directions for their use. Call your doctor or health care professional for advice if you get a fever, chills or sore throat, or other symptoms of a cold or flu. Do not treat yourself. This drug decreases your body's ability to fight infections. Try to avoid being around people who are sick. This medicine may increase your risk to  bruise or bleed. Call your doctor or health care professional if you notice any unusual bleeding. Be careful brushing and flossing your teeth or using a toothpick because you may get an infection or bleed more easily. If you have any dental work done, tell your dentist you are receiving this medicine. Avoid taking products that contain aspirin, acetaminophen, ibuprofen, naproxen, or ketoprofen unless instructed by your doctor. These medicines may hide a fever. Do not become pregnant while taking this medicine or for 6 months after stopping it. Women should inform their doctor if they wish to become pregnant or think they might be pregnant. Men should not father a child while taking this medicine and for 3 months after stopping it. There is a potential for serious side effects to an unborn child. Talk to your health care professional or pharmacist for more information. Do not breast-feed an infant while taking this medicine or for at least 1 week after stopping it. Men should inform their doctors if they wish to father a child. This medicine may lower sperm counts. Talk with your doctor or health care professional if you are concerned about your fertility. What side effects may I notice from receiving this medication? Side effects that you should report to your doctor or health care professional as soon as possible: allergic reactions like skin rash, itching or hives, swelling of the face, lips, or tongue breathing problems pain, redness, or irritation at site where injected signs and symptoms of a dangerous change in heartbeat or heart rhythm like chest pain; dizziness; fast or irregular heartbeat; palpitations; feeling faint or lightheaded, falls; breathing problems signs of decreased platelets or bleeding - bruising, pinpoint red spots on the skin, black, tarry stools, blood in the urine signs of decreased red blood cells - unusually weak or tired, feeling faint or lightheaded, falls signs of  infection - fever or chills, cough, sore throat, pain or difficulty passing urine signs and symptoms of kidney injury like trouble passing urine or change in the amount of urine signs and symptoms of liver injury like dark yellow or brown urine; general ill feeling or flu-like symptoms; light-colored stools; loss of appetite; nausea; right upper belly pain; unusually weak or tired; yellowing of the eyes or skin swelling of ankles, feet, hands Side effects that usually do not require medical attention (report to your doctor or health care professional if they continue or are bothersome): constipation diarrhea hair loss loss of appetite nausea rash vomiting This list may not describe all possible side effects. Call your doctor for medical advice about side effects. You may report side effects to FDA at 1-800-FDA-1088. Where should I keep my medication? This drug is given in a hospital or clinic and will not be stored at home. NOTE: This sheet is a summary. It may not cover all possible information. If you have questions about this medicine,  talk to your doctor, pharmacist, or health care provider.  2022 Elsevier/Gold Standard (2017-04-23 18:06:11)

## 2020-11-24 NOTE — Telephone Encounter (Signed)
Pt has upcoming appointment 

## 2020-11-24 NOTE — Addendum Note (Signed)
Addended by: Charlott Rakes on: 11/24/2020 09:12 AM   Modules accepted: Orders

## 2020-11-26 ENCOUNTER — Encounter: Payer: Self-pay | Admitting: Oncology

## 2020-11-27 ENCOUNTER — Other Ambulatory Visit: Payer: Self-pay

## 2020-11-27 ENCOUNTER — Telehealth: Payer: Self-pay

## 2020-11-27 NOTE — Telephone Encounter (Addendum)
11/28/20- I spoke with pt. She states she had bowel movement."Things are doing better". I encouraged her to keep taking the senna 2-4 tabs po BID to maintain BM daily. She verbalized understanding. She voiced appreciation for my return call to check on her.    11/27/20- Pt reports she is taking Lactulose 101mL TID, and senna 1 tab BID. Last BM was 2 days ago. I recommended pt take 2 Tbsp of Milk of magnesia and 2 Tbsp of mineral oil (per standing order of [name] Parsons,NP). If no results, pt to repeat in 30 minutes. Pt also encouraged to increase her senna to 2 tabs po BID & that she can go up to 4 tabs po BID. Pt to call me back this afternoon for update.     Pt called (11/25/20 )over the weekend ([facility] after hours program) concerned with her blood sugar running high after chemotherapy on 11/24/20. Pt told them she had been taken off her diabetic medications. When I spoke to the patient this morning, 11/27/20, I encouraged her to call her PCP asap to get proper blood sugar treatment restarted. Pt verbalized understanding. Her fasting blood sugar was 200 this morning (11/27/20).

## 2020-11-28 ENCOUNTER — Encounter: Payer: Self-pay | Admitting: Oncology

## 2020-11-29 ENCOUNTER — Ambulatory Visit: Payer: Medicaid Other | Admitting: Family Medicine

## 2020-11-30 ENCOUNTER — Ambulatory Visit: Payer: Medicare Other

## 2020-11-30 ENCOUNTER — Telehealth: Payer: Self-pay | Admitting: Oncology

## 2020-11-30 ENCOUNTER — Other Ambulatory Visit: Payer: Self-pay | Admitting: Hematology and Oncology

## 2020-11-30 ENCOUNTER — Inpatient Hospital Stay: Payer: Medicare Other

## 2020-11-30 DIAGNOSIS — C541 Malignant neoplasm of endometrium: Secondary | ICD-10-CM

## 2020-11-30 DIAGNOSIS — Z5111 Encounter for antineoplastic chemotherapy: Secondary | ICD-10-CM | POA: Diagnosis not present

## 2020-11-30 LAB — BASIC METABOLIC PANEL
BUN: 11 (ref 4–21)
CO2: 29 — AB (ref 13–22)
Chloride: 106 (ref 99–108)
Creatinine: 0.6 (ref 0.5–1.1)
Glucose: 209
Potassium: 3.5 (ref 3.4–5.3)
Sodium: 140 (ref 137–147)

## 2020-11-30 LAB — CBC AND DIFFERENTIAL
HCT: 28 — AB (ref 36–46)
Hemoglobin: 9 — AB (ref 12.0–16.0)
Neutrophils Absolute: 0.2
Platelets: 50 — AB (ref 150–399)
WBC: 0.8

## 2020-11-30 LAB — COMPREHENSIVE METABOLIC PANEL
Albumin: 2.7 — AB (ref 3.5–5.0)
Calcium: 7.7 — AB (ref 8.7–10.7)

## 2020-11-30 LAB — HEPATIC FUNCTION PANEL
ALT: 49 — AB (ref 7–35)
AST: 63 — AB (ref 13–35)
Alkaline Phosphatase: 210 — AB (ref 25–125)
Bilirubin, Total: 3

## 2020-11-30 LAB — CBC
Absolute Lymphocytes: 0.53 — AB (ref 0.65–4.75)
MCV: 86 (ref 81–99)
RBC: 3.2 — AB (ref 3.87–5.11)

## 2020-11-30 NOTE — Telephone Encounter (Signed)
11/30/20 left msg to call with new appts

## 2020-12-01 ENCOUNTER — Inpatient Hospital Stay: Payer: Medicare Other

## 2020-12-01 ENCOUNTER — Telehealth: Payer: Self-pay

## 2020-12-01 ENCOUNTER — Ambulatory Visit: Payer: Medicaid Other

## 2020-12-01 LAB — CA 125: Cancer Antigen (CA) 125: 273 U/mL — ABNORMAL HIGH (ref 0.0–38.1)

## 2020-12-01 NOTE — Progress Notes (Signed)
Sent in request for DOS 10/24 and 10/25 to [employer].

## 2020-12-01 NOTE — Telephone Encounter (Addendum)
12/04/20 - Pt did go to emergency room and was admitted on to hospital. Pt scheduled for labs and f/u with [facility] tomorrow 12/05/20.   Janet [name], pharmacist: Janet [name] been trying to get her in for a zarxio shot since Wednesday.    Pt called to report she was running late to get her shot @ 130p.  Im sleeping a lot. I was wondering if a nurse could come out here and give me the shot"?. I asked pt to explain to me, what she means by not feeling good. She replied, "I dont feel good. I cant eat. Im not drinking.My stomach hurts, like cramping". No BM in 3 days - but she isn't eating or drinking. No N/V. She states, "I don't think I have a fever. I'm just cold". She cant find a thermometer in the home. She is alone until her brother picks up her son from school. No skin reactions. She mentioned she stopped her lactulose. She is taking senna 2 tabs po BID. I explained to her that the injection is to boost her bone marrow to produce white cells. Her white count is low, and she is at high risk for infection. Pt's WBC was 0.8 yesterday. Pt hasn't taken any of her diabetic medications and has not checked her blood sugar. I encouraged pt to go to emergency room for evaluation, either by ambulance or when her brother gets there. She states she would. I notified [name],NP, Dr Bobby Rumpf, Estill Dooms, pharmacist, and Rae,RN. of the above.

## 2020-12-04 ENCOUNTER — Ambulatory Visit: Payer: Medicare Other

## 2020-12-04 ENCOUNTER — Encounter: Payer: Self-pay | Admitting: Oncology

## 2020-12-05 ENCOUNTER — Other Ambulatory Visit: Payer: Medicare Other

## 2020-12-05 ENCOUNTER — Ambulatory Visit: Payer: Medicare Other | Admitting: Oncology

## 2020-12-05 ENCOUNTER — Ambulatory Visit: Payer: Medicare Other | Admitting: Hematology and Oncology

## 2020-12-05 ENCOUNTER — Ambulatory Visit: Payer: Medicare Other

## 2020-12-06 ENCOUNTER — Other Ambulatory Visit: Payer: Self-pay | Admitting: Hematology and Oncology

## 2020-12-06 ENCOUNTER — Inpatient Hospital Stay (INDEPENDENT_AMBULATORY_CARE_PROVIDER_SITE_OTHER): Payer: Medicare Other | Admitting: Hematology and Oncology

## 2020-12-06 ENCOUNTER — Other Ambulatory Visit: Payer: Self-pay

## 2020-12-06 ENCOUNTER — Encounter: Payer: Self-pay | Admitting: Hematology and Oncology

## 2020-12-06 ENCOUNTER — Inpatient Hospital Stay: Payer: Medicare Other

## 2020-12-06 VITALS — BP 127/56 | HR 87 | Temp 97.7°F | Resp 18 | Ht 67.5 in | Wt >= 6400 oz

## 2020-12-06 DIAGNOSIS — C541 Malignant neoplasm of endometrium: Secondary | ICD-10-CM | POA: Diagnosis not present

## 2020-12-06 DIAGNOSIS — Z5111 Encounter for antineoplastic chemotherapy: Secondary | ICD-10-CM | POA: Diagnosis not present

## 2020-12-06 DIAGNOSIS — E876 Hypokalemia: Secondary | ICD-10-CM

## 2020-12-06 LAB — HEPATIC FUNCTION PANEL
ALT: 57 — AB (ref 7–35)
AST: 121 — AB (ref 13–35)
Alkaline Phosphatase: 279 — AB (ref 25–125)
Bilirubin, Total: 3.1

## 2020-12-06 LAB — BASIC METABOLIC PANEL
BUN: 3 — AB (ref 4–21)
CO2: 21 (ref 13–22)
Chloride: 108 (ref 99–108)
Creatinine: 0.6 (ref 0.5–1.1)
Glucose: 160
Potassium: 3.4 (ref 3.4–5.3)
Sodium: 139 (ref 137–147)

## 2020-12-06 LAB — CBC AND DIFFERENTIAL
HCT: 30 — AB (ref 36–46)
Hemoglobin: 9.6 — AB (ref 12.0–16.0)
Neutrophils Absolute: 1.51
Platelets: 49 — AB (ref 150–399)
WBC: 3.5

## 2020-12-06 LAB — AMMONIA: Ammonia: 74 umol/L — ABNORMAL HIGH (ref 9–35)

## 2020-12-06 LAB — COMPREHENSIVE METABOLIC PANEL
Albumin: 2.7 — AB (ref 3.5–5.0)
Calcium: 7.9 — AB (ref 8.7–10.7)

## 2020-12-06 LAB — CBC: RBC: 3.32 — AB (ref 3.87–5.11)

## 2020-12-06 NOTE — Progress Notes (Signed)
Patient Care Team: Charlott Rakes, MD as PCP - General (Family Medicine) Marice Potter, MD as Consulting Physician (Oncology)  Clinic Day:  12/06/2020  Referring physician: Charlott Rakes, MD  ASSESSMENT & PLAN:   Assessment & Plan: Endometrial cancer [facility]) She received Day 1 of cycle 2 carboplatin/ gemcitabine and was later hospitalized. While admitted, her ammonia level had risen significantly to 108 and had decreased to 49 by discharge. Results are pending from today. Her counts are slowly starting to improve with white count 3.5, ANC 1.5, hemoglobin 9.6 and platelets 49. She will keep her scheduled appointment with Dr. Bobby Rumpf next week and we will assess when she is able to restart treatment and if any adjustments need to be made.  Hypokalemia She continues oral potassium. Level today is 3.4.  Hypocalcemia Calcium 7.9 today. We will initiate oral supplement and observe.    The patient understands the plans discussed today and is in agreement with them.  She knows to contact our office if she develops concerns prior to her next appointment.    Melodye Ped, NP  [facility] [address] [place] [address] Dept: [phone] Dept Fax: [phone]   Orders Placed This Encounter  Procedures   Miscellaneous test (send-out)    Standing Status:   Future    Standing Expiration Date:   12/06/2021    Order Specific Question:   Test name / description:    Answer:   STAT Ammonia   Ammonia    Standing Status:   Future    Number of Occurrences:   1    Standing Expiration Date:   12/06/2021      CHIEF COMPLAINT:  CC: A 50 year old female with history of endometrial cancer here for hospital follow up  Current Treatment:  Carboplatin/ gemcitabine  INTERVAL HISTORY:  Shaneca is here today for repeat clinical assessment. She denies fevers or chills. She denies pain. Her appetite is good. Her weight  has been stable.  I have reviewed the past medical history, past surgical history, social history and family history with the patient and they are unchanged from previous note.  ALLERGIES:  is allergic to ciprofloxacin and shrimp [shellfish allergy].  MEDICATIONS:  Current Outpatient Medications  Medication Sig Dispense Refill   albuterol (VENTOLIN HFA) 108 (90 Base) MCG/ACT inhaler Inhale 1-2 puffs into the lungs every 6 (six) hours as needed for wheezing or shortness of breath. 54 g 3   atorvastatin (LIPITOR) 20 MG tablet TAKE 1 TABLET BY MOUTH ONCE DAILY. 30 tablet 5   budesonide-formoterol (SYMBICORT) 160-4.5 MCG/ACT inhaler Inhale 2 puffs into the lungs 2 (two) times daily. (Patient taking differently: Inhale 2 puffs into the lungs 2 (two) times daily as needed (shortness of breath).) 1 each 5   Cholecalciferol (DIALYVITE VITAMIN D 5000) 125 MCG (5000 UT) capsule Take 5,000 Units by mouth daily.     dexamethasone (DECADRON) 4 MG tablet Take 2 tabs at the night before and 2 tab the morning of chemotherapy, every 3 weeks, by mouth 60 tablet 0   DULoxetine (CYMBALTA) 60 MG capsule Take 1 capsule (60 mg total) by mouth daily. 30 capsule 5   dupilumab (DUPIXENT) 300 MG/2ML prefilled syringe Inject 300 mg into the skin every 14 (fourteen) days. 4 mL 11   DUPIXENT 300 MG/2ML SOPN Inject 300 mg into the skin every 14 (fourteen) days.     EPINEPHrine 0.3 mg/0.3 mL IJ SOAJ injection Use as  directed for life threatening allergic reactions 2 each 3   EQ ALLERGY RELIEF, CETIRIZINE, 10 MG tablet Take 1 tablet by mouth once daily (Patient not taking: No sig reported) 90 tablet 0   fluticasone (FLONASE) 50 MCG/ACT nasal spray Place 2 sprays into both nostrils daily. (Patient taking differently: Place 2 sprays into both nostrils daily as needed for allergies.) 16 g 6   GENERLAC 10 GM/15ML SOLN Take 20 g by mouth 3 (three) times daily.     glipiZIDE (GLUCOTROL) 10 MG tablet Take 2 tablets (20 mg total) by  mouth 2 (two) times daily before a meal. 360 tablet 1   Insulin Pen Needle (BD PEN NEEDLE NANO 2ND GEN) 32G X 4 MM MISC USE AS DIRECTED 100 each 5   levothyroxine (SYNTHROID) 150 MCG tablet Take 1 tablet (150 mcg total) by mouth daily. 90 tablet 3   liraglutide (VICTOZA) 18 MG/3ML SOPN INJECT 1.8 MG SUBCUTANEOUSLY ONCE DAILY. 9 mL 3   loratadine (CLARITIN) 10 MG tablet Take 10 mg by mouth daily.     LORazepam (ATIVAN) 0.5 MG tablet Take 1 tablet (0.5 mg total) by mouth 2 (two) times daily as needed for anxiety. Use sparingly 15 tablet 0   losartan (COZAAR) 50 MG tablet Take 1.5 tablets (75 mg total) by mouth daily. 130 tablet 0   Melatonin 10 MG CAPS Take 10 mg by mouth daily as needed (sleep).     metFORMIN (GLUCOPHAGE) 500 MG tablet Take 2 tablets (1,000 mg total) by mouth 2 (two) times daily with a meal. 360 tablet 1   mineral oil liquid Take 30 mLs by mouth daily as needed. Take with 30 mL of Milk of Magnesia for constipation. May repeat in 30 min if no results.     Misc. Devices MISC Rolling walker with seat. Diagnosis: deconditioning 1 each 0   montelukast (SINGULAIR) 10 MG tablet Take 1 tablet (10 mg total) by mouth at bedtime. 30 tablet 4   Multiple Vitamin (MULTIVITAMIN) tablet Take 1 tablet by mouth daily.     omeprazole (PRILOSEC) 40 MG capsule Take 1 capsule (40 mg total) by mouth daily. 30 capsule 3   ondansetron (ZOFRAN) 4 MG tablet Take 1 tablet (4 mg total) by mouth every 4 (four) hours as needed for nausea. 90 tablet 3   OXcarbazepine (TRILEPTAL) 150 MG tablet Take 1 tablet by mouth twice daily 60 tablet 0   polyethylene glycol (MIRALAX / GLYCOLAX) 17 g packet Take 17 g by mouth daily as needed. As needed for constipation.     pregabalin (LYRICA) 75 MG capsule Take 1 capsule by mouth twice daily 180 capsule 0   prochlorperazine (COMPAZINE) 10 MG tablet Take 1 tablet (10 mg total) by mouth every 6 (six) hours as needed for nausea or vomiting. 90 tablet 3   sennosides-docusate  sodium (SENOKOT-S) 8.6-50 MG tablet Take 2 tablets by mouth in the morning and at bedtime. May increase to 4 tablets BID if needed     triamcinolone cream (KENALOG) 0.1 % Apply 1 application topically 2 (two) times daily. (Patient not taking: No sig reported) 80 g 0   zolpidem (AMBIEN) 10 MG tablet Take 1 tablet (10 mg total) by mouth at bedtime as needed for sleep. 30 tablet 0   No current facility-administered medications for this visit.    HISTORY OF PRESENT ILLNESS:   Oncology History  Endometrial carcinoma ([place])  10/04/2020 Initial Diagnosis   Endometrial carcinoma ([place])   10/12/2020 - 10/13/2020 Chemotherapy  10/19/2020 Genetic Testing   Negative hereditary cancer genetic testing: no pathogenic variants detected in [facility] Common Hereditary Cancers +RNA Panel.  The report date is October 19, 2020.    The Common Hereditary Cancers + RNA Panel offered by [facility] includes sequencing, deletion/duplication, and RNA testing of the following 47 genes: APC, ATM, AXIN2, BARD1, BMPR1A, BRCA1, BRCA2, BRIP1, CDH1, CDK4*, CDKN2A (p14ARF)*, CDKN2A (p16INK4a)*, CHEK2, CTNNA1, DICER1, EPCAM (Deletion/duplication testing only), GREM1 (promoter region deletion/duplication testing only), KIT, MEN1, MLH1, MSH2, MSH3, MSH6, MUTYH, NBN, NF1, NHTL1, PALB2, PDGFRA*, PMS2, POLD1, POLE, PTEN, RAD50, RAD51C, RAD51D, SDHB, SDHC, SDHD, SMAD4, SMARCA4. STK11, TP53, TSC1, TSC2, and VHL.  The following genes were evaluated for sequence changes only: SDHA and HOXB13 c.251G>A variant only.  RNA analysis is not performed for the * genes.     11/03/2020 -  Chemotherapy   Patient is on Treatment Plan : UTERINE Carboplatin D1 / Gemcitabine D1,8 q21d         REVIEW OF SYSTEMS:   Constitutional: Denies fevers, chills or abnormal weight loss Eyes: Denies blurriness of vision Ears, nose, mouth, throat, and face: Denies mucositis or sore throat Respiratory: Denies cough, dyspnea or wheezes Cardiovascular: Denies  palpitation, chest discomfort or lower extremity swelling Gastrointestinal:  Denies nausea, heartburn or change in bowel habits Skin: Denies abnormal skin rashes Lymphatics: Denies new lymphadenopathy or easy bruising Neurological:Denies numbness, tingling or new weaknesses Behavioral/Psych: Mood is stable, no new changes  All other systems were reviewed with the patient and are negative.   VITALS:  Blood pressure (!) 127/56, pulse 87, temperature 97.7 F (36.5 C), temperature source Oral, resp. rate 18, height 5' 7.5" (1.715 m), weight (!) 434 lb 12 oz (197.2 kg), SpO2 98 %.  Wt Readings from Last 3 Encounters:  12/06/20 (!) 434 lb 12 oz (197.2 kg)  11/24/20 (!) 431 lb 1.3 oz (195.5 kg)  11/23/20 (!) 430 lb 6.4 oz (195.2 kg)    Body mass index is 67.09 kg/m.  Performance status (ECOG): 1 - Symptomatic but completely ambulatory  PHYSICAL EXAM:   GENERAL:alert, no distress and comfortable SKIN: skin color, texture, turgor are normal, no rashes or significant lesions EYES: normal, Conjunctiva are pink and non-injected, sclera clear OROPHARYNX:no exudate, no erythema and lips, buccal mucosa, and tongue normal  NECK: supple, thyroid normal size, non-tender, without nodularity LYMPH:  no palpable lymphadenopathy in the cervical, axillary or inguinal LUNGS: clear to auscultation and percussion with normal breathing effort HEART: regular rate & rhythm and no murmurs and no lower extremity edema ABDOMEN:abdomen soft, non-tender and normal bowel sounds Musculoskeletal:no cyanosis of digits and no clubbing  NEURO: alert & oriented x 3 with fluent speech, no focal motor/sensory deficits  LABORATORY DATA:  I have reviewed the data as listed    Component Value Date/Time   NA 139 12/06/2020 0000   K 3.4 12/06/2020 0000   CL 108 12/06/2020 0000   CO2 21 12/06/2020 0000   GLUCOSE 129 (H) 08/23/2020 1135   GLUCOSE 127 (H) 12/16/2017 0841   BUN 3 (A) 12/06/2020 0000   CREATININE 0.6  12/06/2020 0000   CREATININE 0.77 08/23/2020 1135   CREATININE 0.74 12/16/2017 0841   CREATININE 0.69 12/26/2015 0929   CALCIUM 7.9 (A) 12/06/2020 0000   PROT 6.2 11/23/2020 0000   ALBUMIN 2.7 (A) 12/06/2020 0000   ALBUMIN 3.4 (L) 08/23/2020 1135   AST 121 (A) 12/06/2020 0000   ALT 57 (A) 12/06/2020 0000   ALKPHOS 279 (A) 12/06/2020 0000   BILITOT  1.9 (H) 08/23/2020 1135   GFRNONAA 81 10/27/2019 1045   GFRNONAA >60 12/16/2017 0841   GFRNONAA >89 12/26/2015 0929   GFRAA 93 10/27/2019 1045   GFRAA >60 12/16/2017 0841   GFRAA >89 12/26/2015 0929    No results found for: SPEP, UPEP  Lab Results  Component Value Date   WBC 3.5 12/06/2020   NEUTROABS 1.51 12/06/2020   HGB 9.6 (A) 12/06/2020   HCT 30 (A) 12/06/2020   MCV 86 11/30/2020   PLT 49 (A) 12/06/2020      Chemistry      Component Value Date/Time   NA 139 12/06/2020 0000   K 3.4 12/06/2020 0000   CL 108 12/06/2020 0000   CO2 21 12/06/2020 0000   BUN 3 (A) 12/06/2020 0000   CREATININE 0.6 12/06/2020 0000   CREATININE 0.77 08/23/2020 1135   CREATININE 0.74 12/16/2017 0841   CREATININE 0.69 12/26/2015 0929   GLU 160 12/06/2020 0000      Component Value Date/Time   CALCIUM 7.9 (A) 12/06/2020 0000   ALKPHOS 279 (A) 12/06/2020 0000   AST 121 (A) 12/06/2020 0000   ALT 57 (A) 12/06/2020 0000   BILITOT 1.9 (H) 08/23/2020 1135

## 2020-12-06 NOTE — Assessment & Plan Note (Signed)
Calcium 7.9 today. We will initiate oral supplement and observe.

## 2020-12-06 NOTE — Assessment & Plan Note (Signed)
She received Day 1 of cycle 2 carboplatin/ gemcitabine and was later hospitalized. While admitted, her ammonia level had risen significantly to 108 and had decreased to 49 by discharge. Results are pending from today. Her counts are slowly starting to improve with white count 3.5, ANC 1.5, hemoglobin 9.6 and platelets 49. She will keep her scheduled appointment with Dr. Bobby Rumpf next week and we will assess when she is able to restart treatment and if any adjustments need to be made.

## 2020-12-06 NOTE — Assessment & Plan Note (Signed)
She continues oral potassium. Level today is 3.4.

## 2020-12-08 ENCOUNTER — Other Ambulatory Visit: Payer: Self-pay | Admitting: Hematology and Oncology

## 2020-12-08 ENCOUNTER — Other Ambulatory Visit: Payer: Self-pay | Admitting: Pharmacist

## 2020-12-08 LAB — CA 125: Cancer Antigen (CA) 125: 388 U/mL — ABNORMAL HIGH (ref 0.0–38.1)

## 2020-12-08 MED ORDER — LACTULOSE 20 GM/30ML PO SOLN: 20.0000 g | Freq: Every day | ORAL | 3 refills | Status: DC

## 2020-12-08 NOTE — Progress Notes (Signed)
[facility]  [address] [place],  [place]  [address] [phone]  Clinic Day:  12/14/2020  Referring physician: Charlott Rakes, MD  This document serves as a record of services personally performed by Dequincy Macarthur Critchley, MD. It was created on their behalf by Curry,Lauren E, a trained medical scribe. The creation of this record is based on the scribe's personal observations and the provider's statements to them.  HISTORY OF PRESENT ILLNESS:  The patient is a 50 y.o. female  with metastatic endometrial cancer.  She comes in today prior to go over her CT scans to ascertain her new disease baseline after 3 cycles of treatment, which now consists gemcitabine/carboplatin. Since her last visit, the patient has been doing okay.  She denies having any side effects with her last cycle of treatment.  As it pertains to her disease, she denies having abdominal pain or other symptoms which concern her for disease progression.    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  All prior scans up until recently had shown no evidence of disease recurrence.      PHYSICAL EXAM:  Blood pressure (!) 125/58, pulse (!) 111, temperature 98.1 F (36.7 C), resp. rate 20, height 5' 7.5" (1.715 m), weight (!) 446 lb 1.6 oz (202.3 kg), SpO2 97 %. Wt Readings from Last 3 Encounters:  12/14/20 (!) 446 lb 1.6 oz (202.3 kg)  12/12/20 (!) 445 lb (201.9 kg)  12/06/20 (!) 434 lb 12 oz (197.2 kg)   Body mass index is 68.84 kg/m. Performance status (ECOG): 1 - Symptomatic but completely ambulatory Physical Exam Constitutional:      Appearance: Normal appearance. She is obese. She is not ill-appearing.     Comments: In a wheelchair  HENT:     Mouth/Throat:     Mouth: Mucous membranes are moist.     Pharynx: Oropharynx is clear. No oropharyngeal exudate or posterior oropharyngeal erythema.  Cardiovascular:     Rate and Rhythm: Normal rate  and regular rhythm.     Heart sounds: No murmur heard.   No friction rub. No gallop.  Pulmonary:     Effort: Pulmonary effort is normal. No respiratory distress.     Breath sounds: Normal breath sounds. No wheezing, rhonchi or rales.  Abdominal:     General: Bowel sounds are normal. There is no distension.     Palpations: Abdomen is soft. There is no mass.     Tenderness: There is no abdominal tenderness.  Musculoskeletal:        General: No swelling.     Right lower leg: No edema.     Left lower leg: No edema.  Lymphadenopathy:     Cervical: No cervical adenopathy.     Upper Body:     Right upper body: No supraclavicular or axillary adenopathy.     Left upper body: No supraclavicular or axillary adenopathy.     Lower Body: No right inguinal adenopathy. No left inguinal adenopathy.  Skin:    General: Skin is warm.     Coloration: Skin is not jaundiced.     Findings: No lesion or rash.  Neurological:     General: No focal deficit present.     Mental Status: She is alert and oriented to person, place, and time. Mental status is at baseline.  Psychiatric:        Mood and Affect: Mood normal.  Behavior: Behavior normal.        Thought Content: Thought content normal.   SCANS:  CT scans of her abdomen/pelvis revealed the following: FINDINGS: As noted previously exam detail is diminished due to patient body habitus. Lower chest: No acute abnormality. Hepatobiliary: The liver appears cirrhotic. There is a nodular contour the liver with hypertrophy of the caudate lobe and lateral segment of left hepatic lobe. No focal liver abnormality identified. Cholecystectomy. Pancreas: Unremarkable. No pancreatic ductal dilatation or surrounding inflammatory changes. Spleen: The spleen is enlarged with a cranial caudal dimension of 16.8 cm. No focal splenic lesion identified. Adrenals/Urinary Tract: The adrenal glands are unremarkable. No kidney mass or hydronephrosis. Bladder is  grossly unremarkable. Stomach/Bowel: Stomach appears nondistended. No pathologic dilatation of the large or small bowel loops. No bowel wall thickening, inflammation, or distension. Vascular/Lymphatic: Normal appearance of the abdominal aorta. Extensive collateral vessels are identified within the left hemiabdomen. The umbilical vein appears recanalized. Distal esophageal and gastric varices noted. No signs of abdominopelvic adenopathy. Reproductive: Hysterectomy. No signs of adnexal mass. Other: Perisplenic and perihepatic ascites is identified fluid is noted extending along the pericolic gutters. Assessment of peritoneal scratch set assessment for peritoneal disease is significantly limited due to patient's body habitus. Increased soft tissue within the ventral aspect of the lower abdomen and pelvis is again noted, image 77/2. 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 Best estimate on today's study is that this measures approximately 3.2 cm in thickness compared with 3.0 cm on 09/07/2020 Musculoskeletal: No acute or significant osseous findings  IMPRESSION: 1. Exam detail is markedly diminished due to patient's body habitus. 2. Morphologic features of the liver compatible with cirrhosis. Stigmata of portal venous hypertension including splenomegaly, ascites and varices. 3. Increased soft tissue within the ventral aspect of the lower abdomen and pelvis is again noted. 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 Quantification and comparison with study from 09/07/2020 is challenging due to diminished exam detail. LABS:     Ref. Range 12/14/2020 00:00  Sodium Latest Ref Range: 137 - 147  138  Potassium Latest Ref Range: 3.4 - 5.3  3.4  Chloride Latest Ref Range: 99 - 108  106  CO2 Latest Ref Range: 13 - 22  22  Glucose Unknown 151  BUN Latest Ref Range: 4 - 21  7  Creatinine  Latest Ref Range: 0.5 - 1.1  0.6  Calcium Latest Ref Range: 8.7 - 10.7  9.1  Alkaline Phosphatase Latest Ref Range: 25 - 125  265 (A)  Albumin Latest Ref Range: 3.5 - 5.0  2.5 (A)  AST Latest Ref Range: 13 - 35  101 (A)  ALT Latest Ref Range: 7 - 35  50 (A)  Bilirubin, Total Unknown 3.2  WBC Unknown 6.3  RBC Latest Ref Range: 3.87 - 5.11  3.36 (A)  Hemoglobin Latest Ref Range: 12.0 - 16.0  10.1 (A)  HCT Latest Ref Range: 36 - 46  31 (A)  Platelets Latest Ref Range: 150 - 399  107 (A)  NEUT# Unknown 4.60    ASSESSMENT & PLAN:  A 50 y.o. female with metastatic endometrial cancer.  In clinic today, I went over her CT scan images with her.  Her body habitus does affect how clear her images appeared on her scans.  From my vantage point, she has less omental disease than what she had on the scans that were done before her chemotherapy  commenced.  Clinically, she appears to be tolerating her chemotherapy well and is having no side effects from it.  She will proceed with her 4th cycle of chemotherapy tomorrow, which is now carboplatin/gemcitabine.  Overall, she appears to be doing okay.  I will see her back  in 3 weeks before she heads into her 5th cycle of chemotherapy.  The patient understands all the plans discussed today and is in agreement with them.   I, Rita Ohara, am acting as scribe for Marice Potter, MD    I have reviewed this report as typed by the medical scribe, and it is complete and accurate.  Dequincy Macarthur Critchley, MD

## 2020-12-11 ENCOUNTER — Other Ambulatory Visit: Payer: Self-pay | Admitting: Oncology

## 2020-12-11 NOTE — Progress Notes (Signed)
Sent in transportation request for DOS 11/03 and 11/11 to [employer]. Transportation already arranged by patient for 11/04.

## 2020-12-12 ENCOUNTER — Ambulatory Visit: Payer: Medicare Other | Attending: Family Medicine | Admitting: Family Medicine

## 2020-12-12 ENCOUNTER — Other Ambulatory Visit: Payer: Self-pay

## 2020-12-12 VITALS — BP 123/74 | HR 103 | Ht 67.5 in | Wt >= 6400 oz

## 2020-12-12 DIAGNOSIS — E1159 Type 2 diabetes mellitus with other circulatory complications: Secondary | ICD-10-CM | POA: Diagnosis not present

## 2020-12-12 DIAGNOSIS — K7469 Other cirrhosis of liver: Secondary | ICD-10-CM

## 2020-12-12 DIAGNOSIS — E039 Hypothyroidism, unspecified: Secondary | ICD-10-CM | POA: Diagnosis not present

## 2020-12-12 DIAGNOSIS — I10 Essential (primary) hypertension: Secondary | ICD-10-CM

## 2020-12-12 DIAGNOSIS — R600 Localized edema: Secondary | ICD-10-CM

## 2020-12-12 DIAGNOSIS — E1149 Type 2 diabetes mellitus with other diabetic neurological complication: Secondary | ICD-10-CM

## 2020-12-12 DIAGNOSIS — C541 Malignant neoplasm of endometrium: Secondary | ICD-10-CM

## 2020-12-12 DIAGNOSIS — I152 Hypertension secondary to endocrine disorders: Secondary | ICD-10-CM

## 2020-12-12 LAB — POCT GLYCOSYLATED HEMOGLOBIN (HGB A1C): HbA1c, POC (controlled diabetic range): 6.6 % (ref 0.0–7.0)

## 2020-12-12 LAB — GLUCOSE, POCT (MANUAL RESULT ENTRY): POC Glucose: 168 mg/dl — AB (ref 70–99)

## 2020-12-12 MED ORDER — SPIRONOLACTONE 25 MG PO TABS: 25.0000 mg | ORAL_TABLET | Freq: Every day | ORAL | 3 refills | Status: AC

## 2020-12-12 MED ORDER — FUROSEMIDE 20 MG PO TABS: 20.0000 mg | ORAL_TABLET | Freq: Every day | ORAL | 3 refills | Status: AC

## 2020-12-12 MED ORDER — LOSARTAN POTASSIUM 25 MG PO TABS
25.0000 mg | ORAL_TABLET | Freq: Every day | ORAL | 3 refills | Status: AC
Start: 2020-12-12 — End: ?

## 2020-12-12 NOTE — Progress Notes (Signed)
Subjective:  Patient ID: Janet [name], female    DOB: [birth year]  Age: 50 y.o. MRN: [id number]  CC: Diabetes   HPI Lisbeth Puller is a 50 y.o. year old female with a history of type 2 diabetes mellitus (A1c of 6.6), Diabetic neuropathy, hypothyroidism, asthma, Seasonal allergies, endometrial adenocarcinoma (s/pTotal abdominal hysterectomy and bilateral salpingo-oophorectomy in 06/2016, recurrent in 2022 with metastasis currently on chemotherapy),  morbid obesity, liver cirrhosis who presents today for chronic disease management.  Interval History: She did have a recurrence of her endometrial carcinoma and is followed by [facility].  Last seen by medical oncology last week.  She has an upcoming appointment for repeat CT abdomen and pelvis. CT abdomen and pelvis from 08/2020 had revealed: IMPRESSION: 1. Diagnostic quality is markedly degraded by body habitus. 2. New omental/peritoneal metastases. 3. Cirrhosis with portal hypertension.  Currently in the process of being referred to GI for her Cirrhosis The Hospital took her off all her Diabetes Medications and her A1c is 6.6. She is doing well on her antihypertensive and her statin. She is unhappy about the fact that she is gaining weight and has been unable to lose weight.  Her appetite is good and she has no fatigue. Currently not receiving psychotherapy but she thinks she could use some and plans to reach out to hospice.  With regards to her diabetes mellitus she was taken off all her medications during hospitalization. She also needs a rolling walker with seat for better mobility and support as a cane will not suffice as she is morbidly obese.  This will allow her to carry out her ADLs independently. Past Medical History:  Diagnosis Date   Anemia    Angio-edema    Anxiety    Arthritis    Asthma    Depression    Diabetes mellitus without complication (HCC)    Hypertension    Hypothyroidism     Pre-diabetes    Right foot drop    Sleep apnea    Thyroid disease    Urticaria    Uterine cancer ([place])     Past Surgical History:  Procedure Laterality Date   ABDOMINAL HYSTERECTOMY     CARPAL TUNNEL RELEASE     CESAREAN SECTION     CESAREAN SECTION N/A    [employer]reesia 03/14/2020   CHOLECYSTECTOMY     cyst removal     uterus   DILATION AND CURETTAGE OF UTERUS     still birth   DILATION AND CURETTAGE OF UTERUS N/A 06/11/2016   Procedure: DILATATION AND CURETTAGE;  Surgeon: Emily Filbert, MD;  Location: [facility];  Service: Gynecology;  Laterality: N/A;   ROBOTIC ASSISTED TOTAL HYSTERECTOMY WITH BILATERAL SALPINGO OOPHERECTOMY Bilateral 07/09/2016   Procedure: XI ROBOTIC ASSISTED TOTAL HYSTERECTOMY WITH BILATERAL SALPINGO OOPHORECTOMY FOR UTERUS GREATER THAN 250 GRAMS, LYSIS OF ADHESIONS;  Surgeon: Everitt Amber, MD;  Location: [facility];  Service: Gynecology;  Laterality: Bilateral;   SINOSCOPY     SINUS EXPLORATION     WISDOM TOOTH EXTRACTION      Family History  Problem Relation Age of Onset   Diabetes Mother    Heart attack Mother    Allergic rhinitis Mother    Suicidality Father    Hyperlipidemia Father    Prostate cancer Father        dx after 10   Colon cancer Maternal Aunt        dx after 50    Allergies  Allergen Reactions  Ciprofloxacin Shortness Of Breath   Shrimp [Shellfish Allergy] Other (See Comments)    positive allergy test     Outpatient Medications Prior to Visit  Medication Sig Dispense Refill   albuterol (VENTOLIN HFA) 108 (90 Base) MCG/ACT inhaler Inhale 1-2 puffs into the lungs every 6 (six) hours as needed for wheezing or shortness of breath. 54 g 3   atorvastatin (LIPITOR) 20 MG tablet TAKE 1 TABLET BY MOUTH ONCE DAILY. 30 tablet 5   budesonide-formoterol (SYMBICORT) 160-4.5 MCG/ACT inhaler Inhale 2 puffs into the lungs 2 (two) times daily. (Patient taking differently: Inhale 2 puffs into the lungs 2 (two) times daily as needed (shortness of breath).) 1 each  5   Cholecalciferol (DIALYVITE VITAMIN D 5000) 125 MCG (5000 UT) capsule Take 5,000 Units by mouth daily.     dexamethasone (DECADRON) 4 MG tablet Take 2 tabs at the night before and 2 tab the morning of chemotherapy, every 3 weeks, by mouth 60 tablet 0   DULoxetine (CYMBALTA) 60 MG capsule Take 1 capsule (60 mg total) by mouth daily. 30 capsule 5   dupilumab (DUPIXENT) 300 MG/2ML prefilled syringe Inject 300 mg into the skin every 14 (fourteen) days. 4 mL 11   DUPIXENT 300 MG/2ML SOPN Inject 300 mg into the skin every 14 (fourteen) days.     EPINEPHrine 0.3 mg/0.3 mL IJ SOAJ injection Use as directed for life threatening allergic reactions 2 each 3   EQ ALLERGY RELIEF, CETIRIZINE, 10 MG tablet Take 1 tablet by mouth once daily (Patient not taking: No sig reported) 90 tablet 0   fluticasone (FLONASE) 50 MCG/ACT nasal spray Place 2 sprays into both nostrils daily. (Patient not taking: Reported on 12/12/2020) 16 g 6   GENERLAC 10 GM/15ML SOLN Take 20 g by mouth 3 (three) times daily.     glipiZIDE (GLUCOTROL) 10 MG tablet Take 2 tablets (20 mg total) by mouth 2 (two) times daily before a meal. 360 tablet 1   Insulin Pen Needle (BD PEN NEEDLE NANO 2ND GEN) 32G X 4 MM MISC USE AS DIRECTED 100 each 5   Lactulose 20 GM/30ML SOLN Take 30 mLs (20 g total) by mouth daily. 450 mL 3   levothyroxine (SYNTHROID) 150 MCG tablet Take 1 tablet (150 mcg total) by mouth daily. 90 tablet 3   liraglutide (VICTOZA) 18 MG/3ML SOPN INJECT 1.8 MG SUBCUTANEOUSLY ONCE DAILY. 9 mL 3   loratadine (CLARITIN) 10 MG tablet Take 10 mg by mouth daily.     LORazepam (ATIVAN) 0.5 MG tablet Take 1 tablet (0.5 mg total) by mouth 2 (two) times daily as needed for anxiety. Use sparingly 15 tablet 0   losartan (COZAAR) 50 MG tablet Take 1.5 tablets (75 mg total) by mouth daily. 130 tablet 0   Melatonin 10 MG CAPS Take 10 mg by mouth daily as needed (sleep). (Patient not taking: Reported on 12/12/2020)     metFORMIN (GLUCOPHAGE) 500 MG  tablet Take 2 tablets (1,000 mg total) by mouth 2 (two) times daily with a meal. 360 tablet 1   mineral oil liquid Take 30 mLs by mouth daily as needed. Take with 30 mL of Milk of Magnesia for constipation. May repeat in 30 min if no results. (Patient not taking: Reported on 12/12/2020)     Misc. Devices MISC Rolling walker with seat. Diagnosis: deconditioning 1 each 0   montelukast (SINGULAIR) 10 MG tablet Take 1 tablet (10 mg total) by mouth at bedtime. 30 tablet 4   Multiple Vitamin (  MULTIVITAMIN) tablet Take 1 tablet by mouth daily. (Patient not taking: Reported on 12/12/2020)     omeprazole (PRILOSEC) 40 MG capsule Take 1 capsule (40 mg total) by mouth daily. 30 capsule 3   ondansetron (ZOFRAN) 4 MG tablet Take 1 tablet (4 mg total) by mouth every 4 (four) hours as needed for nausea. 90 tablet 3   OXcarbazepine (TRILEPTAL) 150 MG tablet Take 1 tablet by mouth twice daily 60 tablet 0   polyethylene glycol (MIRALAX / GLYCOLAX) 17 g packet Take 17 g by mouth daily as needed. As needed for constipation.     pregabalin (LYRICA) 75 MG capsule Take 1 capsule by mouth twice daily 180 capsule 0   prochlorperazine (COMPAZINE) 10 MG tablet Take 1 tablet (10 mg total) by mouth every 6 (six) hours as needed for nausea or vomiting. 90 tablet 3   sennosides-docusate sodium (SENOKOT-S) 8.6-50 MG tablet Take 2 tablets by mouth in the morning and at bedtime. May increase to 4 tablets BID if needed (Patient not taking: Reported on 12/12/2020)     triamcinolone cream (KENALOG) 0.1 % Apply 1 application topically 2 (two) times daily. (Patient not taking: No sig reported) 80 g 0   zolpidem (AMBIEN) 10 MG tablet Take 1 tablet (10 mg total) by mouth at bedtime as needed for sleep. 30 tablet 0   No facility-administered medications prior to visit.     ROS Review of Systems  Constitutional:  Positive for unexpected weight change. Negative for activity change, appetite change and fatigue.  HENT:  Negative for  congestion, sinus pressure and sore throat.   Eyes:  Negative for visual disturbance.  Respiratory:  Negative for cough, chest tightness, shortness of breath and wheezing.   Cardiovascular:  Negative for chest pain and palpitations.  Gastrointestinal:  Negative for abdominal distention, abdominal pain and constipation.  Endocrine: Negative for polydipsia.  Genitourinary:  Negative for dysuria and frequency.  Musculoskeletal:  Negative for arthralgias and back pain.  Skin:  Negative for rash.  Neurological:  Negative for tremors, light-headedness and numbness.  Hematological:  Does not bruise/bleed easily.  Psychiatric/Behavioral:  Negative for agitation and behavioral problems.    Objective:  BP 123/74   Pulse (!) 103   Ht 5' 7.5" (1.715 m)   Wt (!) 445 lb (201.9 kg)   LMP  (LMP Unknown) Comment: irregular periods previously, bleeding started in january  SpO2 100%   BMI 68.67 kg/m   BP/Weight 12/12/2020 12/06/2020 42/59/5638  Systolic BP 756 433 295  Diastolic BP 74 56 75  Wt. (Lbs) 445 434.75 431.08  BMI 68.67 67.09 66.52      Physical Exam Constitutional:      Appearance: She is well-developed. She is obese.  Cardiovascular:     Rate and Rhythm: Tachycardia present.     Heart sounds: Normal heart sounds. No murmur heard. Pulmonary:     Effort: Pulmonary effort is normal.     Breath sounds: Normal breath sounds. No wheezing or rales.     Comments: L chestwall port Chest:     Chest wall: No tenderness.  Abdominal:     General: Bowel sounds are normal. There is no distension.     Palpations: Abdomen is soft. There is no mass.     Tenderness: There is no abdominal tenderness.     Comments: Edema with peau d'orange appearance of lower abdominal wall  Musculoskeletal:        General: Normal range of motion.     Right  lower leg: No edema.     Left lower leg: No edema.  Neurological:     Mental Status: She is alert and oriented to person, place, and time.  Psychiatric:         Mood and Affect: Mood normal.    CMP Latest Ref Rng & Units 12/06/2020 11/30/2020 11/23/2020  Glucose 65 - 99 mg/dL - - -  BUN 4 - 21 3(A) 11 5  Creatinine 0.5 - 1.1 0.6 0.6 0.8  Sodium 137 - 147 139 140 138  Potassium 3.4 - 5.3 3.4 3.5 3.6  Chloride 99 - 108 108 106 108  CO2 13 - 22 21 29(A) 18  Calcium 8.7 - 10.7 7.9(A) 7.7(A) 8.3(A)  Total Protein g/dL - - 6.2  Total Bilirubin 0.0 - 1.2 mg/dL - - -  Alkaline Phos 25 - 125 279(A) 210(A) 223(A)  AST 13 - 35 121(A) 63(A) 135(A)  ALT 7 - 35 57(A) 49(A) 58(A)    Lipid Panel     Component Value Date/Time   CHOL 103 08/23/2020 1135   TRIG 100 08/23/2020 1135   HDL 41 08/23/2020 1135   CHOLHDL 2.5 08/23/2020 1135   CHOLHDL 2.1 12/26/2015 0929   VLDL 15 12/26/2015 0929   LDLCALC 43 08/23/2020 1135    CBC    Component Value Date/Time   WBC 3.5 12/06/2020 0000   WBC 3.0 (L) 09/26/2017 1925   RBC 3.32 (A) 12/06/2020 0000   HGB 9.6 (A) 12/06/2020 0000   HGB 15.0 12/08/2018 1507   HCT 30 (A) 12/06/2020 0000   HCT 43.4 12/08/2018 1507   PLT 49 (A) 12/06/2020 0000   PLT 84 (LL) 12/08/2018 1507   MCV 86 11/30/2020 0000   MCH 30.9 12/08/2018 1507   MCH 24.0 (L) 09/26/2017 1925   MCHC 34.6 12/08/2018 1507   MCHC 30.6 09/26/2017 1925   RDW 13.7 12/08/2018 1507   LYMPHSABS 1.0 12/08/2018 1507   MONOABS 0.2 07/02/2016 1035   EOSABS 0.1 12/08/2018 1507   BASOSABS 0.0 12/08/2018 1507    Lab Results  Component Value Date   HGBA1C 6.6 12/12/2020    Assessment & Plan:  1. Type 2 diabetes mellitus with other neurologic complication, without long-term current use of insulin (HCC) Controlled with A1c of 6.6; goal is less than 7.0 Medications were discontinued during hospitalization Continue diet control Counseled on Diabetic diet, my plate method, 563 minutes of moderate intensity exercise/week Blood sugar logs with fasting goals of 80-120 mg/dl, random of less than 180 and in the event of sugars less than 60 mg/dl or  greater than 400 mg/dl encouraged to notify the clinic. Advised on the need for annual eye exams, annual foot exams, Pneumonia vaccine. - POCT glucose (manual entry) - POCT glycosylated hemoglobin (Hb A1C)  2. Hypertension associated with diabetes ([place]) Controlled Due to the fact that I am adding diuretics to her regimen I have decreased losartan from 75 mg to 25 mg Counseled on blood pressure goal of less than 130/80, low-sodium, DASH diet, medication compliance, 150 minutes of moderate intensity exercise per week. Discussed medication compliance, adverse effects. - losartan (COZAAR) 25 MG tablet; Take 1 tablet (25 mg total) by mouth daily.  Dispense: 30 tablet; Refill: 3  3. Other cirrhosis of liver ([place]) With evidence of esophageal varices We will commence spironolactone and furosemide especially given ascites She is in the process of being referred to GI - spironolactone (ALDACTONE) 25 MG tablet; Take 1 tablet (25 mg total) by mouth  daily.  Dispense: 90 tablet; Refill: 3 - furosemide (LASIX) 20 MG tablet; Take 1 tablet (20 mg total) by mouth daily.  Dispense: 30 tablet; Refill: 3  4. Hypothyroidism, unspecified type Will check level and adjust levothyroxine dose accordingly - T4, free - TSH  5. 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 Edema of abdominal wall Could be secondary to ascites versus metastatic endometrial carcinoma I have placed her on diuretics which should hopefully bring about some improvement     No orders of the defined types were placed in this encounter.   Return in about 3 months (around 03/14/2021) for medical conditions.   50 minutes of total  face to face time spent including median intraservice time reviewing previous notes and test results, counseling patient and ensuring advanced directives are in place in addition to management of chronic medical conditions.Time also spent ordering medications, investigations and documenting in the chart.  All questions were answered to the patient's satisfaction      Charlott Rakes, MD, FAAFP. [facility] and [place], [place]   12/12/2020, 4:08 PM

## 2020-12-13 ENCOUNTER — Other Ambulatory Visit: Payer: Self-pay | Admitting: Family Medicine

## 2020-12-13 LAB — T4, FREE: Free T4: 1.37 ng/dL (ref 0.82–1.77)

## 2020-12-13 LAB — TSH: TSH: 4.6 u[IU]/mL — ABNORMAL HIGH (ref 0.450–4.500)

## 2020-12-13 MED ORDER — LEVOTHYROXINE SODIUM 175 MCG PO TABS: 175.0000 ug | ORAL_TABLET | Freq: Every day | ORAL | 3 refills | Status: AC

## 2020-12-14 ENCOUNTER — Telehealth: Payer: Self-pay | Admitting: Oncology

## 2020-12-14 ENCOUNTER — Other Ambulatory Visit: Payer: Self-pay | Admitting: Pharmacist

## 2020-12-14 ENCOUNTER — Encounter: Payer: Self-pay | Admitting: Oncology

## 2020-12-14 ENCOUNTER — Inpatient Hospital Stay: Payer: Medicare Other | Attending: Hematology and Oncology | Admitting: Oncology

## 2020-12-14 ENCOUNTER — Inpatient Hospital Stay: Payer: Medicare Other

## 2020-12-14 ENCOUNTER — Encounter: Payer: Self-pay | Admitting: Family Medicine

## 2020-12-14 VITALS — BP 125/58 | HR 111 | Temp 98.1°F | Resp 20 | Ht 67.5 in | Wt >= 6400 oz

## 2020-12-14 DIAGNOSIS — Z79899 Other long term (current) drug therapy: Secondary | ICD-10-CM | POA: Insufficient documentation

## 2020-12-14 DIAGNOSIS — Z90710 Acquired absence of both cervix and uterus: Secondary | ICD-10-CM | POA: Insufficient documentation

## 2020-12-14 DIAGNOSIS — C541 Malignant neoplasm of endometrium: Secondary | ICD-10-CM | POA: Diagnosis not present

## 2020-12-14 DIAGNOSIS — C786 Secondary malignant neoplasm of retroperitoneum and peritoneum: Secondary | ICD-10-CM | POA: Insufficient documentation

## 2020-12-14 DIAGNOSIS — Z5111 Encounter for antineoplastic chemotherapy: Secondary | ICD-10-CM | POA: Insufficient documentation

## 2020-12-14 LAB — CBC AND DIFFERENTIAL
HCT: 31 — AB (ref 36–46)
Hemoglobin: 10.1 — AB (ref 12.0–16.0)
Neutrophils Absolute: 4.6
Platelets: 107 — AB (ref 150–399)
WBC: 6.3

## 2020-12-14 LAB — BASIC METABOLIC PANEL WITH GFR
BUN: 7 (ref 4–21)
CO2: 22 (ref 13–22)
Chloride: 106 (ref 99–108)
Creatinine: 0.6 (ref 0.5–1.1)
Glucose: 151
Potassium: 3.4 (ref 3.4–5.3)
Sodium: 138 (ref 137–147)

## 2020-12-14 LAB — HEPATIC FUNCTION PANEL
ALT: 50 — AB (ref 7–35)
AST: 101 — AB (ref 13–35)
Alkaline Phosphatase: 265 — AB (ref 25–125)
Bilirubin, Total: 3.2

## 2020-12-14 LAB — COMPREHENSIVE METABOLIC PANEL
Albumin: 2.5 — AB (ref 3.5–5.0)
Calcium: 9.1 (ref 8.7–10.7)

## 2020-12-14 LAB — CBC: RBC: 3.36 — AB (ref 3.87–5.11)

## 2020-12-14 MED FILL — Gemcitabine HCl Inj 1 GM/26.3ML (38 MG/ML) (Base Equiv): INTRAVENOUS | Qty: 44 | Status: AC

## 2020-12-14 MED FILL — Carboplatin IV Soln 600 MG/60ML: INTRAVENOUS | Qty: 56 | Status: AC

## 2020-12-14 MED FILL — Dexamethasone Sodium Phosphate Inj 100 MG/10ML: INTRAMUSCULAR | Qty: 1 | Status: AC

## 2020-12-14 MED FILL — Fosaprepitant Dimeglumine For IV Infusion 150 MG (Base Eq): INTRAVENOUS | Qty: 5 | Status: AC

## 2020-12-14 NOTE — Telephone Encounter (Signed)
Per 11/3 LOS, patient scheduled for 11/22 Labs, Follow Up - waiting reply from Brandy/Susan on when to schedule Infusion for week of Thanksgiving

## 2020-12-15 ENCOUNTER — Telehealth: Payer: Self-pay | Admitting: Oncology

## 2020-12-15 ENCOUNTER — Other Ambulatory Visit: Payer: Self-pay | Admitting: Oncology

## 2020-12-15 ENCOUNTER — Encounter: Payer: Self-pay | Admitting: Oncology

## 2020-12-15 ENCOUNTER — Inpatient Hospital Stay: Payer: Medicare Other

## 2020-12-15 ENCOUNTER — Other Ambulatory Visit: Payer: Self-pay

## 2020-12-15 VITALS — BP 131/71 | HR 107 | Temp 97.9°F | Resp 20 | Ht 67.5 in | Wt >= 6400 oz

## 2020-12-15 DIAGNOSIS — Z90710 Acquired absence of both cervix and uterus: Secondary | ICD-10-CM | POA: Diagnosis not present

## 2020-12-15 DIAGNOSIS — Z5111 Encounter for antineoplastic chemotherapy: Secondary | ICD-10-CM | POA: Diagnosis present

## 2020-12-15 DIAGNOSIS — Z79899 Other long term (current) drug therapy: Secondary | ICD-10-CM | POA: Diagnosis not present

## 2020-12-15 DIAGNOSIS — C541 Malignant neoplasm of endometrium: Secondary | ICD-10-CM

## 2020-12-15 DIAGNOSIS — C786 Secondary malignant neoplasm of retroperitoneum and peritoneum: Secondary | ICD-10-CM | POA: Diagnosis present

## 2020-12-15 MED ORDER — SODIUM CHLORIDE 0.9 % IV SOLN
10.0000 mg | Freq: Once | INTRAVENOUS | Status: AC
  Administered 2020-12-15: 10 mg via INTRAVENOUS
  Filled 2020-12-15: qty 10

## 2020-12-15 MED ORDER — SODIUM CHLORIDE 0.9 % IV SOLN
150.0000 mg | Freq: Once | INTRAVENOUS | Status: AC
  Administered 2020-12-15: 150 mg via INTRAVENOUS
  Filled 2020-12-15: qty 5

## 2020-12-15 MED ORDER — SODIUM CHLORIDE 0.9 % IV SOLN
560.0000 mg | Freq: Once | INTRAVENOUS | Status: AC
  Administered 2020-12-15: 560 mg via INTRAVENOUS
  Filled 2020-12-15: qty 56

## 2020-12-15 MED ORDER — SODIUM CHLORIDE 0.9% FLUSH
10.0000 mL | INTRAVENOUS | Status: DC | PRN
  Administered 2020-12-15: 10 mL

## 2020-12-15 MED ORDER — SODIUM CHLORIDE 0.9 % IV SOLN: Freq: Once | INTRAVENOUS | Status: AC

## 2020-12-15 MED ORDER — PALONOSETRON HCL INJECTION 0.25 MG/5ML
0.2500 mg | Freq: Once | INTRAVENOUS | Status: AC
  Administered 2020-12-15: 0.25 mg via INTRAVENOUS
  Filled 2020-12-15: qty 5

## 2020-12-15 MED ORDER — SODIUM CHLORIDE 0.9 % IV SOLN
562.5000 mg/m2 | Freq: Once | INTRAVENOUS | Status: AC
  Administered 2020-12-15: 1672 mg via INTRAVENOUS
  Filled 2020-12-15: qty 43.97

## 2020-12-15 MED ORDER — HEPARIN SOD (PORK) LOCK FLUSH 100 UNIT/ML IV SOLN
500.0000 [IU] | Freq: Once | INTRAVENOUS | Status: AC | PRN
  Administered 2020-12-15: 500 [IU]

## 2020-12-15 NOTE — Patient Instructions (Signed)
Carboplatin injection What is this medication? CARBOPLATIN (KAR boe pla tin) is a chemotherapy drug. It targets fast dividing cells, like cancer cells, and causes these cells to die. This medicine is used to treat ovarian cancer and many other cancers. This medicine may be used for other purposes; ask your health care provider or pharmacist if you have questions. COMMON BRAND NAME(S): Paraplatin What should I tell my care team before I take this medication? They need to know if you have any of these conditions: blood disorders hearing problems kidney disease recent or ongoing radiation therapy an unusual or allergic reaction to carboplatin, cisplatin, other chemotherapy, other medicines, foods, dyes, or preservatives pregnant or trying to get pregnant breast-feeding How should I use this medication? This drug is usually given as an infusion into a vein. It is administered in a hospital or clinic by a specially trained health care professional. Talk to your pediatrician regarding the use of this medicine in children. Special care may be needed. Overdosage: If you think you have taken too much of this medicine contact a poison control center or emergency room at once. NOTE: This medicine is only for you. Do not share this medicine with others. What if I miss a dose? It is important not to miss a dose. Call your doctor or health care professional if you are unable to keep an appointment. What may interact with this medication? medicines for seizures medicines to increase blood counts like filgrastim, pegfilgrastim, sargramostim some antibiotics like amikacin, gentamicin, neomycin, streptomycin, tobramycin vaccines Talk to your doctor or health care professional before taking any of these medicines: acetaminophen aspirin ibuprofen ketoprofen naproxen This list may not describe all possible interactions. Give your health care provider a list of all the medicines, herbs, non-prescription  drugs, or dietary supplements you use. Also tell them if you smoke, drink alcohol, or use illegal drugs. Some items may interact with your medicine. What should I watch for while using this medication? Your condition will be monitored carefully while you are receiving this medicine. You will need important blood work done while you are taking this medicine. This drug may make you feel generally unwell. This is not uncommon, as chemotherapy can affect healthy cells as well as cancer cells. Report any side effects. Continue your course of treatment even though you feel ill unless your doctor tells you to stop. In some cases, you may be given additional medicines to help with side effects. Follow all directions for their use. Call your doctor or health care professional for advice if you get a fever, chills or sore throat, or other symptoms of a cold or flu. Do not treat yourself. This drug decreases your body's ability to fight infections. Try to avoid being around people who are sick. This medicine may increase your risk to bruise or bleed. Call your doctor or health care professional if you notice any unusual bleeding. Be careful brushing and flossing your teeth or using a toothpick because you may get an infection or bleed more easily. If you have any dental work done, tell your dentist you are receiving this medicine. Avoid taking products that contain aspirin, acetaminophen, ibuprofen, naproxen, or ketoprofen unless instructed by your doctor. These medicines may hide a fever. Do not become pregnant while taking this medicine. Women should inform their doctor if they wish to become pregnant or think they might be pregnant. There is a potential for serious side effects to an unborn child. Talk to your health care professional or pharmacist  for more information. Do not breast-feed an infant while taking this medicine. What side effects may I notice from receiving this medication? Side effects that you  should report to your doctor or health care professional as soon as possible: allergic reactions like skin rash, itching or hives, swelling of the face, lips, or tongue signs of infection - fever or chills, cough, sore throat, pain or difficulty passing urine signs of decreased platelets or bleeding - bruising, pinpoint red spots on the skin, black, tarry stools, nosebleeds signs of decreased red blood cells - unusually weak or tired, fainting spells, lightheadedness breathing problems changes in hearing changes in vision chest pain high blood pressure low blood counts - This drug may decrease the number of white blood cells, red blood cells and platelets. You may be at increased risk for infections and bleeding. nausea and vomiting pain, swelling, redness or irritation at the injection site pain, tingling, numbness in the hands or feet problems with balance, talking, walking trouble passing urine or change in the amount of urine Side effects that usually do not require medical attention (report to your doctor or health care professional if they continue or are bothersome): hair loss loss of appetite metallic taste in the mouth or changes in taste This list may not describe all possible side effects. Call your doctor for medical advice about side effects. You may report side effects to FDA at 1-800-FDA-1088. Where should I keep my medication? This drug is given in a hospital or clinic and will not be stored at home. NOTE: This sheet is a summary. It may not cover all possible information. If you have questions about this medicine, talk to your doctor, pharmacist, or health care provider.  2022 [employer] Standard (2007-07-08 00:00:00) Gemcitabine injection What is this medication? GEMCITABINE (jem SYE ta been) is a chemotherapy drug. This medicine is used to treat many types of cancer like breast cancer, lung cancer, pancreatic cancer, and ovarian cancer. This medicine may be used for  other purposes; ask your health care provider or pharmacist if you have questions. COMMON BRAND NAME(S): Gemzar, Infugem What should I tell my care team before I take this medication? They need to know if you have any of these conditions: blood disorders infection kidney disease liver disease lung or breathing disease, like asthma recent or ongoing radiation therapy an unusual or allergic reaction to gemcitabine, other chemotherapy, other medicines, foods, dyes, or preservatives pregnant or trying to get pregnant breast-feeding How should I use this medication? This drug is given as an infusion into a vein. It is administered in a hospital or clinic by a specially trained health care professional. Talk to your pediatrician regarding the use of this medicine in children. Special care may be needed. Overdosage: If you think you have taken too much of this medicine contact a poison control center or emergency room at once. NOTE: This medicine is only for you. Do not share this medicine with others. What if I miss a dose? It is important not to miss your dose. Call your doctor or health care professional if you are unable to keep an appointment. What may interact with this medication? medicines to increase blood counts like filgrastim, pegfilgrastim, sargramostim some other chemotherapy drugs like cisplatin vaccines Talk to your doctor or health care professional before taking any of these medicines: acetaminophen aspirin ibuprofen ketoprofen naproxen This list may not describe all possible interactions. Give your health care provider a list of all the medicines, herbs, non-prescription drugs, or  dietary supplements you use. Also tell them if you smoke, drink alcohol, or use illegal drugs. Some items may interact with your medicine. What should I watch for while using this medication? Visit your doctor for checks on your progress. This drug may make you feel generally unwell. This is not  uncommon, as chemotherapy can affect healthy cells as well as cancer cells. Report any side effects. Continue your course of treatment even though you feel ill unless your doctor tells you to stop. In some cases, you may be given additional medicines to help with side effects. Follow all directions for their use. Call your doctor or health care professional for advice if you get a fever, chills or sore throat, or other symptoms of a cold or flu. Do not treat yourself. This drug decreases your body's ability to fight infections. Try to avoid being around people who are sick. This medicine may increase your risk to bruise or bleed. Call your doctor or health care professional if you notice any unusual bleeding. Be careful brushing and flossing your teeth or using a toothpick because you may get an infection or bleed more easily. If you have any dental work done, tell your dentist you are receiving this medicine. Avoid taking products that contain aspirin, acetaminophen, ibuprofen, naproxen, or ketoprofen unless instructed by your doctor. These medicines may hide a fever. Do not become pregnant while taking this medicine or for 6 months after stopping it. Women should inform their doctor if they wish to become pregnant or think they might be pregnant. Men should not father a child while taking this medicine and for 3 months after stopping it. There is a potential for serious side effects to an unborn child. Talk to your health care professional or pharmacist for more information. Do not breast-feed an infant while taking this medicine or for at least 1 week after stopping it. Men should inform their doctors if they wish to father a child. This medicine may lower sperm counts. Talk with your doctor or health care professional if you are concerned about your fertility. What side effects may I notice from receiving this medication? Side effects that you should report to your doctor or health care professional as  soon as possible: allergic reactions like skin rash, itching or hives, swelling of the face, lips, or tongue breathing problems pain, redness, or irritation at site where injected signs and symptoms of a dangerous change in heartbeat or heart rhythm like chest pain; dizziness; fast or irregular heartbeat; palpitations; feeling faint or lightheaded, falls; breathing problems signs of decreased platelets or bleeding - bruising, pinpoint red spots on the skin, black, tarry stools, blood in the urine signs of decreased red blood cells - unusually weak or tired, feeling faint or lightheaded, falls signs of infection - fever or chills, cough, sore throat, pain or difficulty passing urine signs and symptoms of kidney injury like trouble passing urine or change in the amount of urine signs and symptoms of liver injury like dark yellow or brown urine; general ill feeling or flu-like symptoms; light-colored stools; loss of appetite; nausea; right upper belly pain; unusually weak or tired; yellowing of the eyes or skin swelling of ankles, feet, hands Side effects that usually do not require medical attention (report to your doctor or health care professional if they continue or are bothersome): constipation diarrhea hair loss loss of appetite nausea rash vomiting This list may not describe all possible side effects. Call your doctor for medical advice about side  effects. You may report side effects to FDA at 1-800-FDA-1088. Where should I keep my medication? This drug is given in a hospital or clinic and will not be stored at home. NOTE: This sheet is a summary. It may not cover all possible information. If you have questions about this medicine, talk to your doctor, pharmacist, or health care provider.  2022 [employer] Standard (2017-04-23 00:00:00)

## 2020-12-15 NOTE — Telephone Encounter (Signed)
Per 11/3 Staff Msg, patient scheduled for 11/28 Infusion (Had question regarding Lab Appt ok being on 11/22)

## 2020-12-18 ENCOUNTER — Other Ambulatory Visit: Payer: Self-pay | Admitting: Hematology and Oncology

## 2020-12-18 MED ORDER — LACTULOSE 20 GM/30ML PO SOLN: 20.0000 g | Freq: Three times a day (TID) | ORAL | 3 refills | Status: AC

## 2020-12-18 MED ORDER — LACTULOSE 20 GM/30ML PO SOLN: 20.0000 g | Freq: Every day | ORAL | 3 refills | Status: DC

## 2020-12-18 NOTE — Progress Notes (Signed)
Sent in request for DOS 11/10 to [employer].

## 2020-12-21 ENCOUNTER — Other Ambulatory Visit: Payer: Medicare Other

## 2020-12-22 ENCOUNTER — Other Ambulatory Visit: Payer: Self-pay | Admitting: Pharmacist

## 2020-12-22 ENCOUNTER — Encounter: Payer: Self-pay | Admitting: Oncology

## 2020-12-22 ENCOUNTER — Inpatient Hospital Stay: Payer: Medicare Other

## 2021-01-02 ENCOUNTER — Ambulatory Visit: Payer: Medicare Other | Admitting: Oncology

## 2021-01-02 ENCOUNTER — Other Ambulatory Visit: Payer: Medicare Other

## 2021-01-08 ENCOUNTER — Ambulatory Visit: Payer: Medicaid Other | Admitting: Neurology

## 2021-01-08 ENCOUNTER — Ambulatory Visit: Payer: Medicare Other

## 2021-01-11 DEATH — deceased

## 2021-01-24 ENCOUNTER — Ambulatory Visit: Payer: Medicaid Other | Admitting: Family Medicine

## 2021-03-14 ENCOUNTER — Ambulatory Visit: Payer: Medicare Other | Admitting: Family Medicine

## 2021-03-15 ENCOUNTER — Ambulatory Visit: Payer: Medicare Other | Admitting: Family Medicine

## 2022-06-05 NOTE — Telephone Encounter (Signed)
close
